# Patient Record
Sex: Male | Born: 1935 | Race: Black or African American | Hispanic: No | Marital: Married | Smoking: Never smoker
Health system: Southern US, Community
[De-identification: ages and names within clinical notes are randomized; demographics above are authoritative.]

## PROBLEM LIST (undated history)

## (undated) ENCOUNTER — Ambulatory Visit (HOSPITAL_COMMUNITY): Admission: EM | Payer: Medicare Other

## (undated) DIAGNOSIS — E039 Hypothyroidism, unspecified: Secondary | ICD-10-CM

## (undated) DIAGNOSIS — I4892 Unspecified atrial flutter: Secondary | ICD-10-CM

## (undated) DIAGNOSIS — K5790 Diverticulosis of intestine, part unspecified, without perforation or abscess without bleeding: Secondary | ICD-10-CM

## (undated) DIAGNOSIS — N185 Chronic kidney disease, stage 5: Secondary | ICD-10-CM

## (undated) DIAGNOSIS — I10 Essential (primary) hypertension: Secondary | ICD-10-CM

## (undated) DIAGNOSIS — E785 Hyperlipidemia, unspecified: Secondary | ICD-10-CM

## (undated) DIAGNOSIS — D649 Anemia, unspecified: Secondary | ICD-10-CM

## (undated) DIAGNOSIS — N400 Enlarged prostate without lower urinary tract symptoms: Secondary | ICD-10-CM

## (undated) DIAGNOSIS — I4891 Unspecified atrial fibrillation: Secondary | ICD-10-CM

## (undated) DIAGNOSIS — N189 Chronic kidney disease, unspecified: Secondary | ICD-10-CM

## (undated) DIAGNOSIS — I5022 Chronic systolic (congestive) heart failure: Secondary | ICD-10-CM

## (undated) DIAGNOSIS — I447 Left bundle-branch block, unspecified: Secondary | ICD-10-CM

## (undated) HISTORY — PX: HERNIA REPAIR: SHX51

## (undated) HISTORY — DX: Chronic kidney disease, stage 5: N18.5

## (undated) HISTORY — DX: Benign prostatic hyperplasia without lower urinary tract symptoms: N40.0

## (undated) HISTORY — DX: Anemia, unspecified: D64.9

## (undated) HISTORY — DX: Hyperlipidemia, unspecified: E78.5

## (undated) HISTORY — DX: Hypothyroidism, unspecified: E03.9

## (undated) HISTORY — DX: Left bundle-branch block, unspecified: I44.7

## (undated) HISTORY — PX: APPENDECTOMY: SHX54

## (undated) HISTORY — DX: Diverticulosis of intestine, part unspecified, without perforation or abscess without bleeding: K57.90

## (undated) HISTORY — DX: Essential (primary) hypertension: I10

## (undated) HISTORY — DX: Unspecified atrial flutter: I48.92

## (undated) HISTORY — DX: Chronic systolic (congestive) heart failure: I50.22

---

## 2005-01-03 ENCOUNTER — Ambulatory Visit: Payer: Self-pay | Admitting: Family Medicine

## 2007-07-20 DIAGNOSIS — N401 Enlarged prostate with lower urinary tract symptoms: Secondary | ICD-10-CM

## 2007-07-20 DIAGNOSIS — D649 Anemia, unspecified: Secondary | ICD-10-CM | POA: Insufficient documentation

## 2007-07-20 DIAGNOSIS — K5730 Diverticulosis of large intestine without perforation or abscess without bleeding: Secondary | ICD-10-CM | POA: Insufficient documentation

## 2007-07-20 DIAGNOSIS — I10 Essential (primary) hypertension: Secondary | ICD-10-CM | POA: Insufficient documentation

## 2007-07-20 DIAGNOSIS — R351 Nocturia: Secondary | ICD-10-CM

## 2008-06-13 ENCOUNTER — Ambulatory Visit: Payer: Self-pay | Admitting: Family Medicine

## 2008-06-13 LAB — CONVERTED CEMR LAB
ALT: 21 units/L (ref 0–53)
Alkaline Phosphatase: 113 units/L (ref 39–117)
BUN: 26 mg/dL — ABNORMAL HIGH (ref 6–23)
Basophils Absolute: 0.1 10*3/uL (ref 0.0–0.1)
Basophils Relative: 1.2 % — ABNORMAL HIGH (ref 0.0–1.0)
Bilirubin, Direct: 0.1 mg/dL (ref 0.0–0.3)
Cholesterol: 293 mg/dL (ref 0–200)
Creatinine, Ser: 1.7 mg/dL — ABNORMAL HIGH (ref 0.4–1.5)
Eosinophils Absolute: 0.1 10*3/uL (ref 0.0–0.7)
GFR calc Af Amer: 51 mL/min
GFR calc non Af Amer: 43 mL/min
Glucose, Bld: 87 mg/dL (ref 70–99)
HCT: 35.4 % — ABNORMAL LOW (ref 39.0–52.0)
Hemoglobin: 12 g/dL — ABNORMAL LOW (ref 13.0–17.0)
MCHC: 34 g/dL (ref 30.0–36.0)
MCV: 90.2 fL (ref 78.0–100.0)
Monocytes Absolute: 0.6 10*3/uL (ref 0.1–1.0)
Neutro Abs: 2.3 10*3/uL (ref 1.4–7.7)
PSA: 1.22 ng/mL (ref 0.10–4.00)
RDW: 13.7 % (ref 11.5–14.6)
TSH: 1.64 microintl units/mL (ref 0.35–5.50)
Transferrin: 263.8 mg/dL (ref >=212.0)
VLDL: 28 mg/dL (ref 0–40)
Vitamin B-12: 626 pg/mL (ref 211–911)

## 2008-09-08 ENCOUNTER — Ambulatory Visit: Payer: Self-pay | Admitting: Gastroenterology

## 2008-09-22 ENCOUNTER — Ambulatory Visit: Payer: Self-pay | Admitting: Gastroenterology

## 2010-09-19 ENCOUNTER — Ambulatory Visit: Payer: Self-pay | Admitting: Family Medicine

## 2010-09-19 ENCOUNTER — Encounter: Payer: Self-pay | Admitting: Family Medicine

## 2010-09-19 LAB — CONVERTED CEMR LAB
Albumin: 4.2 g/dL (ref 3.5–5.2)
Basophils Absolute: 0 10*3/uL (ref 0.0–0.1)
CO2: 27 meq/L (ref 19–32)
Direct LDL: 222.5 mg/dL
Eosinophils Absolute: 0.1 10*3/uL (ref 0.0–0.7)
Glucose, Bld: 87 mg/dL (ref 70–99)
HCT: 34.6 % — ABNORMAL LOW (ref 39.0–52.0)
Hemoglobin: 11.7 g/dL — ABNORMAL LOW (ref 13.0–17.0)
Ketones, urine, test strip: NEGATIVE
Lymphs Abs: 1.1 10*3/uL (ref 0.7–4.0)
MCHC: 33.9 g/dL (ref 30.0–36.0)
MCV: 90 fL (ref 78.0–100.0)
Neutro Abs: 2.2 10*3/uL (ref 1.4–7.7)
Nitrite: NEGATIVE
Potassium: 4.7 meq/L (ref 3.5–5.1)
RDW: 14.5 % (ref 11.5–14.6)
Sodium: 141 meq/L (ref 135–145)
Specific Gravity, Urine: 1.015
TSH: 2.89 microintl units/mL (ref 0.35–5.50)
Triglycerides: 149 mg/dL (ref 0.0–149.0)
WBC Urine, dipstick: NEGATIVE

## 2010-12-31 NOTE — Assessment & Plan Note (Signed)
Summary: emp/pt fasting/cjr   Vital Signs:  Patient profile:   76 year old male Height:      67.25 inches Weight:      166 pounds BMI:     25.90 Temp:     97.7 degrees F oral BP sitting:   210 / 108  (left arm) Cuff size:   regular  Vitals Entered By: Kern Reap CMA Duncan Dull) (September 19, 2010 10:22 AM) CC: annual wellness exam Is Patient Diabetic? No Pain Assessment Patient in pain? no        CC:  annual wellness exam.  History of Present Illness: Douglas Matthews is a 76 year old male, nonsmoker, who comes in after a two-year absence for evaluation of hypertension.  He was last seen here in July of 2009.  He ran out of his blood pressure in July 2010.  His BP today 210/108, asymptomatic.  He also takes a baby aspirin daily.  He gets routine eye care, no dental care.  He has upper and lower dentures, colonoscopy, 2009, normal, tetanus, 2009, Pneumovax 2000, seasonal flu shot today, information given on shingles.  He was given a beta-blocker, 5 mg and point to a clonidine at 10:30 a.m. and we will monitor his blood pressure.[place] previous blood pressure every 15 minutes, and after an hour.  BP dropped to 170 over hundred still asymptomatic   Here for Medicare AWV:  1.   Risk factors based on Past M, S, F history:...reviewed no changes except for above 2.   Physical Activities: walks daily 3.   Depression/mood: good mood.  No depression 4.   Hearing: normal 5.   ADL's: functions normally independently 6.   Fall Risk: reviewed not identified 7.   Home Safety: reviewed.  No guns in the house 8.   Height, weight, &visual acuity:height weight, normal.  Vision normal 9.   Counseling: take his blood pressure medication daily 10.   Labs ordered based on risk factors: done today 11.           Referral Coordination.........none indicated 12.           Care Plan..reviewed the importance of taking his medication monitoring.  His blood pressure and regular.  Follow-up 13.            Cognitive  Assessment oriented x 3 able to do calculations independently  Allergies: No Known Drug Allergies  Past History:  Past medical, surgical, family and social histories (including risk factors) reviewed, and no changes noted (except as noted below).  Past Medical History: Reviewed history from 06/13/2008 and no changes required. High Cholesterol Anemia-NOS Diverticulosis, colon Hypertension Benign prostatic hypertrophy appendectomy hernia undescended left testicle urologic evaluation.  No surgical intervention  Past Surgical History: Reviewed history from 07/20/2007 and no changes required. Appendectomy Inguinal herniorrhaphy  Family History: Reviewed history from 07/20/2007 and no changes required. Family History of Cardiovascular disorder  Social History: Reviewed history from 06/13/2008 and no changes required. Occupation: Married Never Smoked Alcohol use-no Retired  Review of Systems      See HPI       Flu Vaccine Consent Questions     Do you have a history of severe allergic reactions to this vaccine? no    Any prior history of allergic reactions to egg and/or gelatin? no    Do you have a sensitivity to the preservative Thimersol? no    Do you have a past history of Guillan-Barre Syndrome? no    Do you currently have an acute febrile illness?  no    Have you ever had a severe reaction to latex? no    Vaccine information given and explained to patient? yes    Are you currently pregnant? no    Lot Number:[id number]   Exp Date:05/31/2011   Site Given  Right  Deltoid IM   Physical Exam  General:  Well-developed,well-nourished,in no acute distress; alert,appropriate and cooperative throughout examination Head:  Normocephalic and atraumatic without obvious abnormalities. No apparent alopecia or balding. Eyes:  No corneal or conjunctival inflammation noted. EOMI. Perrla. Funduscopic exam benign, without hemorrhages, exudates or papilledema. Vision grossly  normal. Ears:  External ear exam shows no significant lesions or deformities.  Otoscopic examination reveals clear canals, tympanic membranes are intact bilaterally without bulging, retraction, inflammation or discharge. Hearing is grossly normal bilaterally. Nose:  External nasal examination shows no deformity or inflammation. Nasal mucosa are pink and moist without lesions or exudates. Mouth:  Oral mucosa and oropharynx without lesions or exudates. dentures Neck:  No deformities, masses, or tenderness noted. Chest Wall:  No deformities, masses, tenderness or gynecomastia noted. Breasts:  No masses or gynecomastia noted Lungs:  Normal respiratory effort, chest expands symmetrically. Lungs are clear to auscultation, no crackles or wheezes. Heart:  Normal rate and regular rhythm. S1 and S2 normal without gallop, murmur, click, rub or other extra sounds. Abdomen:  Bowel sounds positive,abdomen soft and non-tender without masses, organomegaly or hernias noted.........Marland Kitchensmall ventral hernia Rectal:  No external abnormalities noted. Normal sphincter tone. No rectal masses or tenderness. Genitalia:  Testes bilaterally descended without nodularity, tenderness or masses. No scrotal masses or lesions. No penis lesions or urethral discharge. Prostate:  no nodules, no asymmetry, and 1+ enlarged.   Msk:  No deformity or scoliosis noted of thoracic or lumbar spine.   Pulses:  R and L carotid,radial,femoral,dorsalis pedis and posterior tibial pulses are full and equal bilaterally Extremities:  No clubbing, cyanosis, edema, or deformity noted with normal full range of motion of all joints.   Neurologic:  No cranial nerve deficits noted. Station and gait are normal. Plantar reflexes are down-going bilaterally. DTRs are symmetrical throughout. Sensory, motor and coordinative functions appear intact. Skin:  Intact without suspicious lesions or rashes Cervical Nodes:  No lymphadenopathy noted Axillary Nodes:  No  palpable lymphadenopathy Inguinal Nodes:  No significant adenopathy Psych:  Cognition and judgment appear intact. Alert and cooperative with normal attention span and concentration. No apparent delusions, illusions, hallucinations   Impression & Recommendations:  Problem # 1:  BENIGN PROSTATIC HYPERTROPHY (ICD-600.00) Assessment Unchanged  Orders: Venipuncture (16109) TLB-Lipid Panel (80061-LIPID) TLB-BMP (Basic Metabolic Panel-BMET) (80048-METABOL) TLB-CBC Platelet - w/Differential (85025-CBCD) TLB-Hepatic/Liver Function Pnl (80076-HEPATIC) TLB-TSH (Thyroid Stimulating Hormone) (84443-TSH) TLB-PSA (Prostate Specific Antigen) (84153-PSA) Prescription Created Electronically [phone]) Medicare -1st Annual Wellness Visit [phone]) Urinalysis-dipstick only (Medicare patient) ([id number])  Problem # 2:  HYPERTENSION (ICD-401.9) Assessment: Deteriorated  His updated medication list for this problem includes:    Tenoretic 50 50-25 Mg Tabs (Atenolol-chlorthalidone) .Marland Kitchen... 1/2 qam  Orders: Venipuncture (56213) TLB-Lipid Panel (80061-LIPID) TLB-BMP (Basic Metabolic Panel-BMET) (80048-METABOL) TLB-CBC Platelet - w/Differential (85025-CBCD) TLB-Hepatic/Liver Function Pnl (80076-HEPATIC) TLB-TSH (Thyroid Stimulating Hormone) (84443-TSH) TLB-PSA (Prostate Specific Antigen) (84153-PSA) Prescription Created Electronically [phone]) Medicare -1st Annual Wellness Visit [phone]) Urinalysis-dipstick only (Medicare patient) (29528UX) EKG w/ Interpretation (93000)  Problem # 3:  Preventive Health Care (ICD-V70.0) Assessment: Unchanged  Complete Medication List: 1)  Adult Aspirin Low Strength 81 Mg Tbdp (Aspirin) 2)  Tenoretic 50 50-25 Mg Tabs (Atenolol-chlorthalidone) .... 1/2 qam  Other Orders: Flu Vaccine 69yrs +  MEDICARE PATIENTS [phone]) Administration Flu vaccine - MCR (Z3086)  Patient Instructions: 1)  restart the Tenoretic 50 -- 25 does one half tablet q.a.m.Marland Kitchen  Purchase a new digital blood  pressure cuff and measure your blood pressure daily in the morning.  Return in one week for follow-up with the data in the device 2)  Take an Aspirin every day. 3)  Choose your Health care Power of Attorney and/or prepare a Living Will. Prescriptions: TENORETIC 50 50-25 MG  TABS (ATENOLOL-CHLORTHALIDONE) 1/2 qam  #50 x 3   Entered and Authorized by:   Roderick Pee MD   Signed by:   Roderick Pee MD on 09/19/2010   Method used:   Electronically to        [facility] (retail)       [address]       [place], [place]  [address]       Ph: [phone]       Fax: [phone]   RxID:   [id number]    Orders Added: 1)  Venipuncture [87564] 2)  TLB-Lipid Panel [80061-LIPID] 3)  TLB-BMP (Basic Metabolic Panel-BMET) [80048-METABOL] 4)  TLB-CBC Platelet - w/Differential [85025-CBCD] 5)  TLB-Hepatic/Liver Function Pnl [80076-HEPATIC] 6)  TLB-TSH (Thyroid Stimulating Hormone) [84443-TSH] 7)  TLB-PSA (Prostate Specific Antigen) [33295-JOA] 8)  Prescription Created Electronically [G8553] 9)  Medicare -1st Annual Wellness Visit [G0438] 10)  Urinalysis-dipstick only (Medicare patient) [81003QW] 11)  EKG w/ Interpretation [93000] 12)  Flu Vaccine 79yrs + MEDICARE PATIENTS [Q2039] 13)  Administration Flu vaccine - MCR [G0008]    Laboratory Results   Urine Tests    Routine Urinalysis   Color: yellow Appearance: Clear Glucose: negative   (Normal Range: Negative) Bilirubin: negative   (Normal Range: Negative) Ketone: negative   (Normal Range: Negative) Spec. Gravity: 1.015   (Normal Range: 1.003-1.035) Blood: negative   (Normal Range: Negative) pH: 5.5   (Normal Range: 5.0-8.0) Protein: negative   (Normal Range: Negative) Urobilinogen: 0.2   (Normal Range: 0-1) Nitrite: negative   (Normal Range: Negative) Leukocyte Esterace: negative   (Normal Range: Negative)    Comments: Rita Ohara  September 19, 2010 11:37 AM

## 2011-12-15 ENCOUNTER — Ambulatory Visit (INDEPENDENT_AMBULATORY_CARE_PROVIDER_SITE_OTHER): Payer: Medicare Other | Admitting: Family Medicine

## 2011-12-15 ENCOUNTER — Encounter: Payer: Self-pay | Admitting: Family Medicine

## 2011-12-15 DIAGNOSIS — N400 Enlarged prostate without lower urinary tract symptoms: Secondary | ICD-10-CM

## 2011-12-15 DIAGNOSIS — Z0000 Encounter for general adult medical examination without abnormal findings: Secondary | ICD-10-CM

## 2011-12-15 DIAGNOSIS — Z23 Encounter for immunization: Secondary | ICD-10-CM

## 2011-12-15 DIAGNOSIS — I10 Essential (primary) hypertension: Secondary | ICD-10-CM

## 2011-12-15 DIAGNOSIS — D649 Anemia, unspecified: Secondary | ICD-10-CM

## 2011-12-15 LAB — CBC WITH DIFFERENTIAL/PLATELET
Basophils Absolute: 0 10*3/uL (ref 0.0–0.1)
Eosinophils Absolute: 0.1 10*3/uL (ref 0.0–0.7)
Lymphocytes Relative: 27.9 % (ref 12.0–46.0)
MCHC: 33.9 g/dL (ref 30.0–36.0)
Neutrophils Relative %: 54.4 % (ref 43.0–77.0)
Platelets: 314 10*3/uL (ref 150.0–400.0)
RBC: 3.9 Mil/uL — ABNORMAL LOW (ref 4.22–5.81)
RDW: 14.3 % (ref 11.5–14.6)

## 2011-12-15 LAB — LIPID PANEL
Cholesterol: 300 mg/dL — ABNORMAL HIGH (ref 0–200)
Total CHOL/HDL Ratio: 6

## 2011-12-15 LAB — POCT URINALYSIS DIPSTICK
Glucose, UA: NEGATIVE
Nitrite, UA: NEGATIVE
Protein, UA: NEGATIVE
Urobilinogen, UA: 0.2

## 2011-12-15 LAB — BASIC METABOLIC PANEL
Calcium: 9.1 mg/dL (ref 8.4–10.5)
Creatinine, Ser: 3.2 mg/dL — ABNORMAL HIGH (ref 0.4–1.5)
GFR: 24.8 mL/min — ABNORMAL LOW (ref >60.00)
Glucose, Bld: 108 mg/dL — ABNORMAL HIGH (ref 70–99)
Sodium: 139 mEq/L (ref 135–145)

## 2011-12-15 LAB — HEPATIC FUNCTION PANEL
AST: 19 U/L (ref 0–37)
Albumin: 4.2 g/dL (ref 3.5–5.2)
Alkaline Phosphatase: 147 U/L — ABNORMAL HIGH (ref 39–117)
Bilirubin, Direct: 0.1 mg/dL (ref 0.0–0.3)
Total Protein: 7.8 g/dL (ref 6.0–8.3)

## 2011-12-15 LAB — TSH: TSH: 1.57 u[IU]/mL (ref 0.35–5.50)

## 2011-12-15 MED ORDER — ATENOLOL 25 MG PO TABS
25.0000 mg | ORAL_TABLET | Freq: Every day | ORAL | Status: DC
Start: 2011-12-15 — End: 2012-12-09

## 2011-12-15 NOTE — Patient Instructions (Signed)
Take the Tenormin, one tablet daily.  Purchase a upper arm. digital blood pressure cuff and measure your blood pressure daily in the morning.  Return in 4 weeks with the data and the device to follow-up on your blood pressure to be sure it is normal

## 2011-12-15 NOTE — Progress Notes (Signed)
  Subjective:    Patient ID: Douglas Matthews, male    DOB: [birth year], 77 y.o.   MRN: [id number]  HPI  Ekam  is a 77 year old, married man nonsmoker Who comes in today for a Medicare wellness examination because of a history of hypertension, BPH, without a blood obstruction, anemia, and history of diverticulosis.  He has not been taking his beta blocker for his hypertension.  BP today 140/100 pulse 80 and regular.  He gets routine eye care, hearing normal, upper and lower dentures, colonoscopy, and GI, tetanus, 2009, Pneumovax, x 2, seasonal flu shot 2012.  Information given on shingles.  Cognitive function normal.  He walks on a regular basis.  His weight is stable at hundred and 66 pounds two he manages his own financial affairs, and no safety reviewed.  No issues identified, no guns in the house, he does not have a healthcare power of attorney, nor a living will.  Advised to get those this year.  Review of Systems  Constitutional: Negative.   HENT: Negative.   Eyes: Negative.   Respiratory: Negative.   Cardiovascular: Negative.   Gastrointestinal: Negative.   Genitourinary: Negative.   Musculoskeletal: Negative.   Skin: Negative.   Neurological: Negative.   Hematological: Negative.   Psychiatric/Behavioral: Negative.        Objective:   Physical Exam  Constitutional: He is oriented to person, place, and time. He appears well-developed and well-nourished.  HENT:  Head: Normocephalic and atraumatic.  Right Ear: External ear normal.  Left Ear: External ear normal.  Nose: Nose normal.  Mouth/Throat: Oropharynx is clear and moist.  Eyes: Conjunctivae and EOM are normal. Pupils are equal, round, and reactive to light.  Neck: Normal range of motion. Neck supple. No JVD present. No tracheal deviation present. No thyromegaly present.  Cardiovascular: Normal rate, regular rhythm, normal heart sounds and intact distal pulses.  Exam reveals no gallop and no friction rub.   No murmur  heard. Pulmonary/Chest: Effort normal and breath sounds normal. No stridor. No respiratory distress. He has no wheezes. He has no rales. He exhibits no tenderness.  Abdominal: Soft. Bowel sounds are normal. He exhibits no distension and no mass. There is no tenderness. There is no rebound and no guarding.  Genitourinary: Rectum normal and penis normal. Guaiac negative stool. No penile tenderness.       One to 2+ symmetrical.  BPH, non-nodular  Musculoskeletal: Normal range of motion. He exhibits no edema and no tenderness.  Lymphadenopathy:    He has no cervical adenopathy.  Neurological: He is alert and oriented to person, place, and time. He has normal reflexes. No cranial nerve deficit. He exhibits normal muscle tone.  Skin: Skin is warm and dry. No rash noted. No erythema. No pallor.  Psychiatric: He has a normal mood and affect. His behavior is normal. Judgment and thought content normal.          Assessment & Plan:  Healthy male.  Hypertension.  We start medication, and BP checked daily.  Follow-up in 4 weeks.  BPH with outlet obstruction.  Check labs

## 2011-12-23 ENCOUNTER — Ambulatory Visit (INDEPENDENT_AMBULATORY_CARE_PROVIDER_SITE_OTHER): Payer: Medicare Other | Admitting: Family Medicine

## 2011-12-23 ENCOUNTER — Encounter: Payer: Self-pay | Admitting: Family Medicine

## 2011-12-23 DIAGNOSIS — N19 Unspecified kidney failure: Secondary | ICD-10-CM | POA: Diagnosis not present

## 2011-12-23 DIAGNOSIS — I10 Essential (primary) hypertension: Secondary | ICD-10-CM

## 2011-12-23 NOTE — Patient Instructions (Signed)
Stay at complete bedrest at home.  Drink lots of water.  Take a 25-mg Tenormin tablet now then 25 mg every morning.  Salt free diet.  Check your blood pressure 3 times daily.  Return on Thursday for follow-up.  We will get you set up for a nephrology consult ASAP

## 2011-12-23 NOTE — Progress Notes (Signed)
  Subjective:    Patient ID: Douglas Matthews, male    DOB: [birth year], 77 y.o.   MRN: [id number]  HPI Douglas Matthews is a 77 year old maleWho comes in today for follow up of hypertension.  He is not taking his medication Tenormin, 50 mg on a daily basis.  He states he can't remember to take it every day.  BP today 198/105.  Right arm sitting position.  Neurologically asymptomatic.  He was given  2  clonidine and 50 mg of Benicar, and BP was checked Q15 minutes.  His blood pressure dropped to 170 over hundred, pulse was 80 and regular.  His lab work week ago shows a creatinine of 3.2, BUN of 49, and a GFR of 24.  We will maintain him at bed rest at home and continue low-dose beta-blocker and get him set up for a nephrology consult ASAP   Review of Systems General and cardiovascular review of systems otherwise negative    Objective:   Physical Exam  Well-developed well-nourished man in no acute distress.  BP right arms, sitting position initially, 195/105 upon discharge 170 over hundred      Assessment & Plan:  Hypertension and renal failure.  Plan bed rest at home, salt free diet, continue beta-blocker daily.  Follow-up in 48 hours nephrology consult ASAP

## 2011-12-25 ENCOUNTER — Encounter: Payer: Self-pay | Admitting: Family Medicine

## 2011-12-25 ENCOUNTER — Ambulatory Visit (INDEPENDENT_AMBULATORY_CARE_PROVIDER_SITE_OTHER): Payer: Medicare Other | Admitting: Family Medicine

## 2011-12-25 VITALS — BP 140/80 | Temp 97.6°F | Wt 169.0 lb

## 2011-12-25 DIAGNOSIS — K5730 Diverticulosis of large intestine without perforation or abscess without bleeding: Secondary | ICD-10-CM | POA: Diagnosis not present

## 2011-12-25 DIAGNOSIS — N19 Unspecified kidney failure: Secondary | ICD-10-CM | POA: Diagnosis not present

## 2011-12-25 DIAGNOSIS — I10 Essential (primary) hypertension: Secondary | ICD-10-CM | POA: Diagnosis not present

## 2011-12-25 LAB — BASIC METABOLIC PANEL
BUN: 36 mg/dL — ABNORMAL HIGH (ref 6–23)
Calcium: 8.9 mg/dL (ref 8.4–10.5)
GFR: 28.5 mL/min — ABNORMAL LOW (ref >60.00)
Potassium: 6.1 mEq/L (ref 3.5–5.1)
Sodium: 138 mEq/L (ref 135–145)

## 2011-12-25 NOTE — Patient Instructions (Signed)
Resume U. Normal activities.  Salt free diet.  Take your blood pressure daily in the morning.  Blood pressure check at home once daily.  Return in one week for follow-up, sooner if any problems

## 2011-12-25 NOTE — Progress Notes (Signed)
  Subjective:    Patient ID: Douglas Matthews, male    DOB: 1934-12-25, 77 y.o.   MRN: [id number]  HPI Douglas Matthews is a 77 year old male, who comes in today for reevaluation of hypertension.  We saw him earlier in the week with marked elevation of his blood pressure 190 over hundred.  We gave him clonidine and a beta blocker here in the office and blood pressure came down to 170 systolic.  He was placed at bed rest at home.  Complete salt free diet and given Tenormin 25 mg daily.  He comes in today stating he feels much better.  BP 140/80   Review of Systems    General and cardiovascular review of systems otherwise negative Objective:   Physical Exam  Well-developed well-nourished, male in no acute distress.  BP right arm sitting position 140/90, pulse 70 and regular      Assessment & Plan:  Hypertension and continue current therapy,,,,,,,,,,,, allowed to ambulate, salt free diet, well 15 minutes daily, continue beta-blocker 25 mg daily, BP checked daily.  Renal failure.  Plan recheck labs.  Nephrology consult ASAP

## 2011-12-26 ENCOUNTER — Other Ambulatory Visit: Payer: Medicare Other

## 2011-12-29 ENCOUNTER — Other Ambulatory Visit (INDEPENDENT_AMBULATORY_CARE_PROVIDER_SITE_OTHER): Payer: Medicare Other

## 2011-12-29 DIAGNOSIS — I10 Essential (primary) hypertension: Secondary | ICD-10-CM

## 2011-12-29 LAB — BASIC METABOLIC PANEL
CO2: 25 mEq/L (ref 19–32)
Calcium: 8.9 mg/dL (ref 8.4–10.5)
Creatinine, Ser: 2.1 mg/dL — ABNORMAL HIGH (ref 0.4–1.5)
Sodium: 141 mEq/L (ref 135–145)

## 2012-01-02 DIAGNOSIS — I10 Essential (primary) hypertension: Secondary | ICD-10-CM | POA: Diagnosis not present

## 2012-01-02 DIAGNOSIS — D509 Iron deficiency anemia, unspecified: Secondary | ICD-10-CM | POA: Diagnosis not present

## 2012-01-02 DIAGNOSIS — N179 Acute kidney failure, unspecified: Secondary | ICD-10-CM | POA: Diagnosis not present

## 2012-01-12 ENCOUNTER — Ambulatory Visit: Payer: Medicare Other | Admitting: Family Medicine

## 2012-01-29 DIAGNOSIS — N179 Acute kidney failure, unspecified: Secondary | ICD-10-CM | POA: Diagnosis not present

## 2012-01-29 DIAGNOSIS — D509 Iron deficiency anemia, unspecified: Secondary | ICD-10-CM | POA: Diagnosis not present

## 2012-03-22 DIAGNOSIS — D509 Iron deficiency anemia, unspecified: Secondary | ICD-10-CM | POA: Diagnosis not present

## 2012-03-22 DIAGNOSIS — N179 Acute kidney failure, unspecified: Secondary | ICD-10-CM | POA: Diagnosis not present

## 2012-03-22 DIAGNOSIS — I10 Essential (primary) hypertension: Secondary | ICD-10-CM | POA: Diagnosis not present

## 2012-04-01 DIAGNOSIS — N2581 Secondary hyperparathyroidism of renal origin: Secondary | ICD-10-CM | POA: Diagnosis not present

## 2012-04-01 DIAGNOSIS — D631 Anemia in chronic kidney disease: Secondary | ICD-10-CM | POA: Diagnosis not present

## 2012-04-01 DIAGNOSIS — I10 Essential (primary) hypertension: Secondary | ICD-10-CM | POA: Diagnosis not present

## 2012-04-01 DIAGNOSIS — N039 Chronic nephritic syndrome with unspecified morphologic changes: Secondary | ICD-10-CM | POA: Diagnosis not present

## 2012-04-05 ENCOUNTER — Other Ambulatory Visit (HOSPITAL_COMMUNITY): Payer: Self-pay | Admitting: *Deleted

## 2012-04-06 ENCOUNTER — Encounter (HOSPITAL_COMMUNITY): Payer: Medicare Other

## 2012-05-07 ENCOUNTER — Encounter (HOSPITAL_COMMUNITY): Payer: Medicare Other

## 2012-07-20 DIAGNOSIS — N2581 Secondary hyperparathyroidism of renal origin: Secondary | ICD-10-CM | POA: Diagnosis not present

## 2012-07-20 DIAGNOSIS — N039 Chronic nephritic syndrome with unspecified morphologic changes: Secondary | ICD-10-CM | POA: Diagnosis not present

## 2012-07-20 DIAGNOSIS — I10 Essential (primary) hypertension: Secondary | ICD-10-CM | POA: Diagnosis not present

## 2012-07-20 DIAGNOSIS — D509 Iron deficiency anemia, unspecified: Secondary | ICD-10-CM | POA: Diagnosis not present

## 2012-10-19 DIAGNOSIS — N039 Chronic nephritic syndrome with unspecified morphologic changes: Secondary | ICD-10-CM | POA: Diagnosis not present

## 2012-10-19 DIAGNOSIS — I10 Essential (primary) hypertension: Secondary | ICD-10-CM | POA: Diagnosis not present

## 2012-10-19 DIAGNOSIS — D509 Iron deficiency anemia, unspecified: Secondary | ICD-10-CM | POA: Diagnosis not present

## 2012-10-19 DIAGNOSIS — N2581 Secondary hyperparathyroidism of renal origin: Secondary | ICD-10-CM | POA: Diagnosis not present

## 2012-11-04 DIAGNOSIS — D509 Iron deficiency anemia, unspecified: Secondary | ICD-10-CM | POA: Diagnosis not present

## 2012-11-04 DIAGNOSIS — I10 Essential (primary) hypertension: Secondary | ICD-10-CM | POA: Diagnosis not present

## 2012-11-04 DIAGNOSIS — N2581 Secondary hyperparathyroidism of renal origin: Secondary | ICD-10-CM | POA: Diagnosis not present

## 2012-11-04 DIAGNOSIS — Z23 Encounter for immunization: Secondary | ICD-10-CM | POA: Diagnosis not present

## 2012-11-18 DIAGNOSIS — I120 Hypertensive chronic kidney disease with stage 5 chronic kidney disease or end stage renal disease: Secondary | ICD-10-CM | POA: Diagnosis not present

## 2012-12-09 ENCOUNTER — Emergency Department (HOSPITAL_COMMUNITY)
Admission: EM | Admit: 2012-12-09 | Discharge: 2012-12-09 | Disposition: A | Discharge status: Home / Self Care | Payer: Medicare Other | Attending: Emergency Medicine | Admitting: Emergency Medicine

## 2012-12-09 ENCOUNTER — Encounter (HOSPITAL_COMMUNITY): Payer: Self-pay | Admitting: Cardiology

## 2012-12-09 DIAGNOSIS — Z9889 Other specified postprocedural states: Secondary | ICD-10-CM | POA: Insufficient documentation

## 2012-12-09 DIAGNOSIS — Z7982 Long term (current) use of aspirin: Secondary | ICD-10-CM | POA: Insufficient documentation

## 2012-12-09 DIAGNOSIS — Z862 Personal history of diseases of the blood and blood-forming organs and certain disorders involving the immune mechanism: Secondary | ICD-10-CM | POA: Diagnosis not present

## 2012-12-09 DIAGNOSIS — N2581 Secondary hyperparathyroidism of renal origin: Secondary | ICD-10-CM | POA: Diagnosis not present

## 2012-12-09 DIAGNOSIS — E875 Hyperkalemia: Secondary | ICD-10-CM | POA: Insufficient documentation

## 2012-12-09 DIAGNOSIS — D649 Anemia, unspecified: Secondary | ICD-10-CM | POA: Diagnosis not present

## 2012-12-09 DIAGNOSIS — Z9089 Acquired absence of other organs: Secondary | ICD-10-CM | POA: Diagnosis not present

## 2012-12-09 DIAGNOSIS — Z87448 Personal history of other diseases of urinary system: Secondary | ICD-10-CM | POA: Diagnosis not present

## 2012-12-09 DIAGNOSIS — N289 Disorder of kidney and ureter, unspecified: Secondary | ICD-10-CM | POA: Insufficient documentation

## 2012-12-09 DIAGNOSIS — E785 Hyperlipidemia, unspecified: Secondary | ICD-10-CM | POA: Insufficient documentation

## 2012-12-09 DIAGNOSIS — I10 Essential (primary) hypertension: Secondary | ICD-10-CM | POA: Diagnosis not present

## 2012-12-09 DIAGNOSIS — I120 Hypertensive chronic kidney disease with stage 5 chronic kidney disease or end stage renal disease: Secondary | ICD-10-CM | POA: Diagnosis not present

## 2012-12-09 LAB — CBC
HCT: 33.1 % — ABNORMAL LOW (ref 39.0–52.0)
Hemoglobin: 10.7 g/dL — ABNORMAL LOW (ref 13.0–17.0)
MCH: 28.3 pg (ref 26.0–34.0)
MCHC: 32.3 g/dL (ref 30.0–36.0)

## 2012-12-09 LAB — BASIC METABOLIC PANEL
BUN: 39 mg/dL — ABNORMAL HIGH (ref 6–23)
Calcium: 9.7 mg/dL (ref 8.4–10.5)
GFR calc non Af Amer: 19 mL/min — ABNORMAL LOW (ref >90)
Glucose, Bld: 111 mg/dL — ABNORMAL HIGH (ref 70–99)

## 2012-12-09 MED ORDER — SODIUM POLYSTYRENE SULFONATE 15 GM/60ML PO SUSP
50.0000 g | Freq: Once | ORAL | Status: AC
  Administered 2012-12-09: 50 g via ORAL
  Filled 2012-12-09: qty 60
  Filled 2012-12-09: qty 120
  Filled 2012-12-09: qty 60

## 2012-12-09 NOTE — ED Notes (Signed)
Dr Ignacia Palma notified of potassium of 6.4.

## 2012-12-09 NOTE — ED Notes (Signed)
Pt reports he was having routine lab work done today and received a call about elevated potassium levels. States he is seen by [facility], denies any chest pain, SOB or N/v. Pt states he "feels fine". No distress noted at triage.

## 2012-12-09 NOTE — ED Provider Notes (Signed)
History     CSN: [id number]  Arrival date & time 12/09/12  1658   None     Chief Complaint  Patient presents with  . Abnormal Lab    (Consider location/radiation/quality/duration/timing/severity/associated sxs/prior treatment) Patient is a 78 y.o. male presenting with general illness. The history is provided by the patient.  Illness  Episode onset: The patient had been seen by his nephrologist, and had blood work. His potassium was found to be high. Therefore he was sent to the ED for further evaluation. Episode frequency: He has never had high potassium before. He is entirely asymptomatic. The problem has been unchanged. The problem is moderate. Nothing relieves the symptoms. Nothing aggravates the symptoms. Pertinent negatives include no fever. He has been eating and drinking normally. Urine output has been normal. There were no sick contacts. Recently, medical care has been given by a specialist and at another facility.    Past Medical History  Diagnosis Date  . Hyperlipidemia   . Anemia   . Diverticulosis   . Hypertension   . BPH (benign prostatic hypertrophy)     Past Surgical History  Procedure Date  . Appendectomy   . Hernia repair     Family History  Problem Relation Age of Onset  . Heart disease Other     History  Substance Use Topics  . Smoking status: Never Smoker   . Smokeless tobacco: Not on file  . Alcohol Use: No      Review of Systems  Constitutional: Negative for fever.  All other systems reviewed and are negative.    Allergies  Review of patient's allergies indicates no known allergies.  Home Medications   Current Outpatient Rx  Name  Route  Sig  Dispense  Refill  . AMLODIPINE BESYLATE 5 MG PO TABS   Oral   Take 5 mg by mouth daily.         . ASPIRIN 81 MG PO TABS   Oral   Take 81 mg by mouth daily.          . ATENOLOL 50 MG PO TABS   Oral   Take 50 mg by mouth daily.           BP 124/75  Pulse 69  Temp 98 F (36.7  C) (Oral)  Resp 13  SpO2 100%  Physical Exam  Nursing note and vitals reviewed. Constitutional: He is oriented to person, place, and time. He appears well-developed and well-nourished. No distress.  HENT:  Head: Normocephalic and atraumatic.  Right Ear: External ear normal.  Left Ear: External ear normal.  Mouth/Throat: Oropharynx is clear and moist.  Eyes: EOM are normal. Pupils are equal, round, and reactive to light.  Neck: Normal range of motion. Neck supple.  Cardiovascular: Normal rate, regular rhythm and normal heart sounds.   Pulmonary/Chest: Effort normal and breath sounds normal.  Abdominal: Soft. Bowel sounds are normal.  Musculoskeletal: Normal range of motion.  Neurological: He is alert and oriented to person, place, and time.       No Sensory or motor deficit.  Skin: Skin is warm and dry.  Psychiatric: He has a normal mood and affect. His behavior is normal.    ED Course  Procedures (including critical care time)  7:39 PM Results for orders placed during the hospital encounter of 12/09/12  CBC      Component Value Range   WBC 4.7  4.0 - 10.5 K/uL   RBC 3.78 (*) 4.22 - 5.81 MIL/uL  Hemoglobin 10.7 (*) 13.0 - 17.0 g/dL   HCT 40.9 (*) 81.1 - 91.4 %   MCV 87.6  78.0 - 100.0 fL   MCH 28.3  26.0 - 34.0 pg   MCHC 32.3  30.0 - 36.0 g/dL   RDW 78.2  95.6 - 21.3 %   Platelets 223  150 - 400 K/uL  BASIC METABOLIC PANEL      Component Value Range   Sodium 136  135 - 145 mEq/L   Potassium 6.4 (*) 3.5 - 5.1 mEq/L   Chloride 102  96 - 112 mEq/L   CO2 21  19 - 32 mEq/L   Glucose, Bld 111 (*) 70 - 99 mg/dL   BUN 39 (*) 6 - 23 mg/dL   Creatinine, Ser 0.86 (*) 0.50 - 1.35 mg/dL   Calcium 9.7  8.4 - 57.8 mg/dL   GFR calc non Af Amer 19 (*) >90 mL/min   GFR calc Af Amer 22 (*) >90 mL/min  POCT I-STAT TROPONIN I      Component Value Range   Troponin i, poc 0.01  0.00 - 0.08 ng/mL   Comment 3             Potassium was high at 6.4. Patient's condition was discussed  with Beryle Lathe, M.D., his nephrologist. He recommended Kayexalate 50 g orally. Patient should followup in the office tomorrow for repeat potassium check.     1. Hyperkalemia   2. Renal insufficiency          Carleene Cooper III, MD 12/10/12 1256

## 2012-12-09 NOTE — ED Notes (Signed)
Pt sent here by [facility] d/t elevated potassium level, pt states, "My doctor found protein in my kidneys & I have been going to the kidney doctor since."

## 2012-12-10 DIAGNOSIS — E875 Hyperkalemia: Secondary | ICD-10-CM | POA: Diagnosis not present

## 2013-02-17 DIAGNOSIS — I10 Essential (primary) hypertension: Secondary | ICD-10-CM | POA: Diagnosis not present

## 2013-02-17 DIAGNOSIS — E876 Hypokalemia: Secondary | ICD-10-CM | POA: Diagnosis not present

## 2013-02-17 DIAGNOSIS — N2581 Secondary hyperparathyroidism of renal origin: Secondary | ICD-10-CM | POA: Diagnosis not present

## 2013-02-17 DIAGNOSIS — D649 Anemia, unspecified: Secondary | ICD-10-CM | POA: Diagnosis not present

## 2013-03-02 ENCOUNTER — Other Ambulatory Visit (HOSPITAL_COMMUNITY): Payer: Self-pay | Admitting: *Deleted

## 2013-03-03 ENCOUNTER — Encounter (HOSPITAL_COMMUNITY): Payer: Medicare Other

## 2013-03-21 DIAGNOSIS — N2581 Secondary hyperparathyroidism of renal origin: Secondary | ICD-10-CM | POA: Diagnosis not present

## 2013-03-21 DIAGNOSIS — D649 Anemia, unspecified: Secondary | ICD-10-CM | POA: Diagnosis not present

## 2013-03-21 DIAGNOSIS — I10 Essential (primary) hypertension: Secondary | ICD-10-CM | POA: Diagnosis not present

## 2013-05-16 DIAGNOSIS — E875 Hyperkalemia: Secondary | ICD-10-CM | POA: Diagnosis not present

## 2013-05-16 DIAGNOSIS — N2581 Secondary hyperparathyroidism of renal origin: Secondary | ICD-10-CM | POA: Diagnosis not present

## 2013-05-16 DIAGNOSIS — I10 Essential (primary) hypertension: Secondary | ICD-10-CM | POA: Diagnosis not present

## 2013-05-16 DIAGNOSIS — D509 Iron deficiency anemia, unspecified: Secondary | ICD-10-CM | POA: Diagnosis not present

## 2013-07-12 DIAGNOSIS — D509 Iron deficiency anemia, unspecified: Secondary | ICD-10-CM | POA: Diagnosis not present

## 2013-07-12 DIAGNOSIS — I10 Essential (primary) hypertension: Secondary | ICD-10-CM | POA: Diagnosis not present

## 2013-07-12 DIAGNOSIS — N2581 Secondary hyperparathyroidism of renal origin: Secondary | ICD-10-CM | POA: Diagnosis not present

## 2014-01-09 ENCOUNTER — Ambulatory Visit (INDEPENDENT_AMBULATORY_CARE_PROVIDER_SITE_OTHER): Payer: Medicare Other | Admitting: Family Medicine

## 2014-01-09 ENCOUNTER — Encounter: Payer: Self-pay | Admitting: Family Medicine

## 2014-01-09 VITALS — BP 190/100 | Temp 98.1°F | Ht 68.0 in | Wt 168.0 lb

## 2014-01-09 DIAGNOSIS — N19 Unspecified kidney failure: Secondary | ICD-10-CM | POA: Diagnosis not present

## 2014-01-09 DIAGNOSIS — D649 Anemia, unspecified: Secondary | ICD-10-CM | POA: Diagnosis not present

## 2014-01-09 DIAGNOSIS — Z0000 Encounter for general adult medical examination without abnormal findings: Secondary | ICD-10-CM

## 2014-01-09 DIAGNOSIS — N400 Enlarged prostate without lower urinary tract symptoms: Secondary | ICD-10-CM

## 2014-01-09 DIAGNOSIS — Z23 Encounter for immunization: Secondary | ICD-10-CM

## 2014-01-09 DIAGNOSIS — I10 Essential (primary) hypertension: Secondary | ICD-10-CM | POA: Diagnosis not present

## 2014-01-09 LAB — CBC WITH DIFFERENTIAL/PLATELET
BASOS PCT: 0.4 % (ref 0.0–3.0)
Basophils Absolute: 0 10*3/uL (ref 0.0–0.1)
EOS PCT: 2.6 % (ref 0.0–5.0)
Eosinophils Absolute: 0.1 10*3/uL (ref 0.0–0.7)
HCT: 34.9 % — ABNORMAL LOW (ref 39.0–52.0)
HEMOGLOBIN: 11.5 g/dL — AB (ref 13.0–17.0)
Lymphocytes Relative: 25.1 % (ref 12.0–46.0)
Lymphs Abs: 1.2 10*3/uL (ref 0.7–4.0)
MCHC: 32.9 g/dL (ref 30.0–36.0)
MCV: 89.5 fl (ref 78.0–100.0)
MONO ABS: 0.7 10*3/uL (ref 0.1–1.0)
Monocytes Relative: 14.6 % — ABNORMAL HIGH (ref 3.0–12.0)
NEUTROS ABS: 2.7 10*3/uL (ref 1.4–7.7)
NEUTROS PCT: 57.3 % (ref 43.0–77.0)
Platelets: 227 10*3/uL (ref 150.0–400.0)
RBC: 3.89 Mil/uL — AB (ref 4.22–5.81)
RDW: 15.5 % — ABNORMAL HIGH (ref 11.5–14.6)
WBC: 4.7 10*3/uL (ref 4.5–10.5)

## 2014-01-09 LAB — BASIC METABOLIC PANEL
BUN: 39 mg/dL — ABNORMAL HIGH (ref 6–23)
CHLORIDE: 107 meq/L (ref 96–112)
CO2: 24 meq/L (ref 19–32)
Calcium: 9.2 mg/dL (ref 8.4–10.5)
Creatinine, Ser: 2.2 mg/dL — ABNORMAL HIGH (ref 0.4–1.5)
GFR: 37.59 mL/min — ABNORMAL LOW (ref >60.00)
GLUCOSE: 86 mg/dL (ref 70–99)
Potassium: 5.2 mEq/L — ABNORMAL HIGH (ref 3.5–5.1)
SODIUM: 140 meq/L (ref 135–145)

## 2014-01-09 LAB — POCT URINALYSIS DIPSTICK
Bilirubin, UA: NEGATIVE
GLUCOSE UA: NEGATIVE
Leukocytes, UA: NEGATIVE
Nitrite, UA: NEGATIVE
RBC UA: NEGATIVE
SPEC GRAV UA: 1.02
Urobilinogen, UA: 0.2
pH, UA: 5.5

## 2014-01-09 LAB — PSA: PSA: 2.59 ng/mL (ref 0.10–4.00)

## 2014-01-09 LAB — HEPATIC FUNCTION PANEL
ALK PHOS: 151 U/L — AB (ref 39–117)
ALT: 15 U/L (ref 0–53)
AST: 20 U/L (ref 0–37)
Albumin: 4.1 g/dL (ref 3.5–5.2)
BILIRUBIN DIRECT: 0.1 mg/dL (ref 0.0–0.3)
BILIRUBIN TOTAL: 0.5 mg/dL (ref 0.3–1.2)
TOTAL PROTEIN: 7.5 g/dL (ref 6.0–8.3)

## 2014-01-09 LAB — TSH: TSH: 3.31 u[IU]/mL (ref 0.35–5.50)

## 2014-01-09 MED ORDER — ATENOLOL 50 MG PO TABS: ORAL_TABLET | ORAL | Status: DC

## 2014-01-09 NOTE — Progress Notes (Signed)
   Subjective:    Patient ID: Douglas Matthews, male    DOB: [birth year], 79 y.o.   MRN: [id number]  HPI Douglas Matthews is a 79 year old married male nonsmoker who comes in today for a Medicare wellness examination because of a history of hypertension and renal failure  His blood pressure today is 190/100 on Tenormin 50 mg daily. He says his blood pressure this morning at home was 160/80  He's due to see Dr. Salena Saner. his nephrologist on Wednesday of this week.  He gets routine eye care, dental care.... upper and lower dentures........ colonoscopy and GI recently normal  Vaccinations updated today by Fleet Contrasachel  Cognitive function normal he walks 30 minutes a day home health safety reviewed no issues identified, no guns in the house, he does have a health care power of attorney and living will.   Review of Systems  Constitutional: Negative.   HENT: Negative.   Eyes: Negative.   Respiratory: Negative.   Cardiovascular: Negative.   Gastrointestinal: Negative.   Endocrine: Negative.   Genitourinary: Negative.   Musculoskeletal: Negative.   Skin: Negative.   Allergic/Immunologic: Negative.   Neurological: Negative.   Hematological: Negative.   Psychiatric/Behavioral: Negative.        Objective:   Physical Exam  Nursing note and vitals reviewed. Constitutional: He is oriented to person, place, and time. He appears well-developed and well-nourished.  HENT:  Head: Normocephalic and atraumatic.  Right Ear: External ear normal.  Left Ear: External ear normal.  Nose: Nose normal.  Mouth/Throat: Oropharynx is clear and moist.  Bluish pigment of each cornea benign condition according to his ophthalmologist Dr. Mitzi DavenportBrewington  Eyes: Conjunctivae and EOM are normal. Pupils are equal, round, and reactive to light.  Neck: Normal range of motion. Neck supple. No JVD present. No tracheal deviation present. No thyromegaly present.  Cardiovascular: Normal rate, regular rhythm, normal heart sounds and intact  distal pulses.  Exam reveals no gallop and no friction rub.   No murmur heard.  No carotid or at bruits peripheral pulses 1+ and symmetrical  Pulmonary/Chest: Effort normal and breath sounds normal. No stridor. No respiratory distress. He has no wheezes. He has no rales. He exhibits no tenderness.  Abdominal: Soft. Bowel sounds are normal. He exhibits no distension and no mass. There is no tenderness. There is no rebound and no guarding.  Genitourinary: Rectum normal, prostate normal and penis normal. Guaiac negative stool. No penile tenderness.  Right testicle normal left testicle has ascended up into the inguinal canal. He's been seen in the past by urology a maze advised no therapy  Musculoskeletal: Normal range of motion. He exhibits no edema and no tenderness.  Lymphadenopathy:    He has no cervical adenopathy.  Neurological: He is alert and oriented to person, place, and time. He has normal reflexes. No cranial nerve deficit. He exhibits normal muscle tone.  Skin: Skin is warm and dry. No rash noted. No erythema. No pallor.  Psychiatric: He has a normal mood and affect. His behavior is normal. Judgment and thought content normal.          Assessment & Plan:  Hypertension not at goal  Renal failure,,,,,,,,,,,, BP check 3 times daily continue current medication followup with Dr. Salena Saner. on Wednesday of this week  Undescended left testicle observe  BPH observe  Anemia secondary to renal failure

## 2014-01-09 NOTE — Patient Instructions (Signed)
Check your blood pressure morning noon and evening  Take a copy of our your blood pressure readings with you to your appointment with Dr. see on Wednesday  In the meantime continue the Tenormin one a day...........Marland Kitchen. but at a half a tab at bedtime

## 2014-01-09 NOTE — Progress Notes (Signed)
Pre visit review using our clinic review tool, if applicable. No additional management support is needed unless otherwise documented below in the visit note. 

## 2014-01-10 ENCOUNTER — Telehealth: Payer: Self-pay | Admitting: Family Medicine

## 2014-01-10 NOTE — Telephone Encounter (Signed)
Relevant patient education mailed to patient.  

## 2014-01-11 DIAGNOSIS — D638 Anemia in other chronic diseases classified elsewhere: Secondary | ICD-10-CM | POA: Diagnosis not present

## 2014-01-11 DIAGNOSIS — N183 Chronic kidney disease, stage 3 unspecified: Secondary | ICD-10-CM | POA: Diagnosis not present

## 2014-01-11 DIAGNOSIS — I129 Hypertensive chronic kidney disease with stage 1 through stage 4 chronic kidney disease, or unspecified chronic kidney disease: Secondary | ICD-10-CM | POA: Diagnosis not present

## 2014-01-11 DIAGNOSIS — N2581 Secondary hyperparathyroidism of renal origin: Secondary | ICD-10-CM | POA: Diagnosis not present

## 2014-04-25 DIAGNOSIS — N183 Chronic kidney disease, stage 3 unspecified: Secondary | ICD-10-CM | POA: Diagnosis not present

## 2014-07-11 ENCOUNTER — Encounter: Payer: Self-pay | Admitting: Gastroenterology

## 2014-08-31 DIAGNOSIS — I10 Essential (primary) hypertension: Secondary | ICD-10-CM | POA: Diagnosis not present

## 2014-08-31 DIAGNOSIS — Z23 Encounter for immunization: Secondary | ICD-10-CM | POA: Diagnosis not present

## 2014-08-31 DIAGNOSIS — N183 Chronic kidney disease, stage 3 (moderate): Secondary | ICD-10-CM | POA: Diagnosis not present

## 2014-08-31 DIAGNOSIS — N2581 Secondary hyperparathyroidism of renal origin: Secondary | ICD-10-CM | POA: Diagnosis not present

## 2014-08-31 DIAGNOSIS — D638 Anemia in other chronic diseases classified elsewhere: Secondary | ICD-10-CM | POA: Diagnosis not present

## 2015-03-09 DIAGNOSIS — D638 Anemia in other chronic diseases classified elsewhere: Secondary | ICD-10-CM | POA: Diagnosis not present

## 2015-03-09 DIAGNOSIS — N2581 Secondary hyperparathyroidism of renal origin: Secondary | ICD-10-CM | POA: Diagnosis not present

## 2015-03-09 DIAGNOSIS — I10 Essential (primary) hypertension: Secondary | ICD-10-CM | POA: Diagnosis not present

## 2015-03-09 DIAGNOSIS — N183 Chronic kidney disease, stage 3 (moderate): Secondary | ICD-10-CM | POA: Diagnosis not present

## 2015-04-26 ENCOUNTER — Ambulatory Visit (INDEPENDENT_AMBULATORY_CARE_PROVIDER_SITE_OTHER): Payer: Medicare Other | Admitting: Family Medicine

## 2015-04-26 ENCOUNTER — Encounter: Payer: Self-pay | Admitting: Family Medicine

## 2015-04-26 VITALS — BP 134/84 | Temp 97.8°F | Ht 68.0 in | Wt 163.0 lb

## 2015-04-26 DIAGNOSIS — N19 Unspecified kidney failure: Secondary | ICD-10-CM

## 2015-04-26 DIAGNOSIS — N401 Enlarged prostate with lower urinary tract symptoms: Secondary | ICD-10-CM | POA: Diagnosis not present

## 2015-04-26 DIAGNOSIS — D509 Iron deficiency anemia, unspecified: Secondary | ICD-10-CM

## 2015-04-26 DIAGNOSIS — I10 Essential (primary) hypertension: Secondary | ICD-10-CM

## 2015-04-26 DIAGNOSIS — R351 Nocturia: Secondary | ICD-10-CM

## 2015-04-26 LAB — POCT URINALYSIS DIPSTICK
Bilirubin, UA: NEGATIVE
Blood, UA: NEGATIVE
GLUCOSE UA: NEGATIVE
KETONES UA: NEGATIVE
Leukocytes, UA: NEGATIVE
Nitrite, UA: NEGATIVE
Protein, UA: NEGATIVE
Spec Grav, UA: 1.015
Urobilinogen, UA: 0.2
pH, UA: 5.5

## 2015-04-26 LAB — BASIC METABOLIC PANEL
BUN: 41 mg/dL — ABNORMAL HIGH (ref 6–23)
CALCIUM: 9.3 mg/dL (ref 8.4–10.5)
CHLORIDE: 105 meq/L (ref 96–112)
CO2: 27 meq/L (ref 19–32)
CREATININE: 2.18 mg/dL — AB (ref 0.40–1.50)
GFR: 37.86 mL/min — ABNORMAL LOW (ref >60.00)
GLUCOSE: 90 mg/dL (ref 70–99)
Potassium: 4.3 mEq/L (ref 3.5–5.1)
SODIUM: 137 meq/L (ref 135–145)

## 2015-04-26 LAB — CBC WITH DIFFERENTIAL/PLATELET
BASOS ABS: 0 10*3/uL (ref 0.0–0.1)
BASOS PCT: 0.5 % (ref 0.0–3.0)
Eosinophils Absolute: 0.1 10*3/uL (ref 0.0–0.7)
Eosinophils Relative: 2.4 % (ref 0.0–5.0)
HCT: 31.6 % — ABNORMAL LOW (ref 39.0–52.0)
HEMOGLOBIN: 10.5 g/dL — AB (ref 13.0–17.0)
LYMPHS PCT: 23.4 % (ref 12.0–46.0)
Lymphs Abs: 1.2 10*3/uL (ref 0.7–4.0)
MCHC: 33.3 g/dL (ref 30.0–36.0)
MCV: 87.4 fl (ref 78.0–100.0)
MONO ABS: 0.8 10*3/uL (ref 0.1–1.0)
MONOS PCT: 14.9 % — AB (ref 3.0–12.0)
NEUTROS PCT: 58.8 % (ref 43.0–77.0)
Neutro Abs: 3.1 10*3/uL (ref 1.4–7.7)
PLATELETS: 252 10*3/uL (ref 150.0–400.0)
RBC: 3.62 Mil/uL — ABNORMAL LOW (ref 4.22–5.81)
RDW: 15.2 % (ref 11.5–15.5)
WBC: 5.2 10*3/uL (ref 4.0–10.5)

## 2015-04-26 LAB — TSH: TSH: 3.15 u[IU]/mL (ref 0.35–4.50)

## 2015-04-26 MED ORDER — AMLODIPINE BESYLATE 10 MG PO TABS: ORAL_TABLET | ORAL | Status: DC

## 2015-04-26 NOTE — Progress Notes (Signed)
Pre visit review using our clinic review tool, if applicable. No additional management support is needed unless otherwise documented below in the visit note. 

## 2015-04-26 NOTE — Patient Instructions (Signed)
Amlodipine 10 mg............. one tablet daily in the morning........ check your blood pressure daily in the morning about an hour or 2 after you take your medication........... if you need a new blood pressure cuff I would recommend a [employer] pump up digital blood pressure cuff from [employer]  Return in one month for follow-up........Marland Kitchen. bring a record of all your blood pressure readings and your blood pressure cuff with you to the next visit  Labs today we'll call you the report

## 2015-04-26 NOTE — Progress Notes (Signed)
   Subjective:    Patient ID: Douglas Matthews, male    DOB: 1935-10-29, 80 y.o.   MRN: [id number]  HPI Douglas Matthews is a 80 year old married male nonsmoker who comes in today for general physical examination because of a history of hypertension, renal insufficiency, anemia secondary to renal insufficiency, and BPH  He gets routine eye care, dental care no because he has upper and lower plates. Colonoscopy 2009 normal  Vaccinations up-to-date  He saw the nephrologist and April of this year. They stopped his beta blocker and put him on Norvasc 10 mg daily. Today BP 158/98.  Vaccinations up-to-date  Cognitive function normal he walks on a daily basis home health safety reviewed no issues identified, no guns in the house, he does have a healthcare power of attorney and living well.   Review of Systems  Constitutional: Negative.   HENT: Negative.   Eyes: Negative.   Respiratory: Negative.   Cardiovascular: Negative.   Gastrointestinal: Negative.   Endocrine: Negative.   Genitourinary: Negative.   Musculoskeletal: Negative.   Skin: Negative.   Allergic/Immunologic: Negative.   Neurological: Negative.   Hematological: Negative.   Psychiatric/Behavioral: Negative.        Objective:   Physical Exam  Constitutional: He is oriented to person, place, and time. He appears well-developed and well-nourished.  HENT:  Head: Normocephalic and atraumatic.  Right Ear: External ear normal.  Left Ear: External ear normal.  Nose: Nose normal.  Mouth/Throat: Oropharynx is clear and moist.  Eyes: Conjunctivae and EOM are normal. Pupils are equal, round, and reactive to light.  Neck: Normal range of motion. Neck supple. No JVD present. No tracheal deviation present. No thyromegaly present.  Cardiovascular: Normal rate, normal heart sounds and intact distal pulses.  Exam reveals no gallop and no friction rub.   No murmur heard. No carotid nor aortic bruits peripheral pulses 1+ and symmetrical  On  physical exam he has a irregular pulse. EKG shows some ectopic beats. Most of which are notable type beats. Asymptomatic  Pulmonary/Chest: Effort normal and breath sounds normal. No stridor. No respiratory distress. He has no wheezes. He has no rales. He exhibits no tenderness.  Abdominal: Soft. Bowel sounds are normal. He exhibits no distension and no mass. There is no tenderness. There is no rebound and no guarding.  Genitourinary: Rectum normal and penis normal. Guaiac negative stool. No penile tenderness.  2+ symmetrical nonnodular BPH  Musculoskeletal: Normal range of motion. He exhibits no edema or tenderness.  Lymphadenopathy:    He has no cervical adenopathy.  Neurological: He is alert and oriented to person, place, and time. He has normal reflexes. No cranial nerve deficit. He exhibits normal muscle tone.  Skin: Skin is warm and dry. No rash noted. No erythema. No pallor.  Psychiatric: He has a normal mood and affect. His behavior is normal. Judgment and thought content normal.  Nursing note and vitals reviewed.         Assessment & Plan:  Hypertension,,,,,,,,, BP not at goal,,,,,,,, check blood pressure daily return in one month for follow-up  History of renal insufficiency,,,,,,,,, followed by nephrology Dr. Kem BoroughsJC  BPH,,,,,, asymptomatic  History of anemia secondary to renal insufficiency

## 2015-10-10 LAB — HM DIABETES EYE EXAM

## 2015-10-24 ENCOUNTER — Encounter: Payer: Self-pay | Admitting: Family Medicine

## 2015-12-14 DIAGNOSIS — I10 Essential (primary) hypertension: Secondary | ICD-10-CM | POA: Diagnosis not present

## 2015-12-14 DIAGNOSIS — D638 Anemia in other chronic diseases classified elsewhere: Secondary | ICD-10-CM | POA: Diagnosis not present

## 2015-12-14 DIAGNOSIS — E875 Hyperkalemia: Secondary | ICD-10-CM | POA: Diagnosis not present

## 2015-12-14 DIAGNOSIS — N183 Chronic kidney disease, stage 3 (moderate): Secondary | ICD-10-CM | POA: Diagnosis not present

## 2015-12-14 DIAGNOSIS — N2581 Secondary hyperparathyroidism of renal origin: Secondary | ICD-10-CM | POA: Diagnosis not present

## 2015-12-14 DIAGNOSIS — Z9114 Patient's other noncompliance with medication regimen: Secondary | ICD-10-CM | POA: Diagnosis not present

## 2016-04-22 ENCOUNTER — Encounter: Payer: Self-pay | Admitting: Family Medicine

## 2016-04-22 ENCOUNTER — Ambulatory Visit (INDEPENDENT_AMBULATORY_CARE_PROVIDER_SITE_OTHER): Payer: Medicare Other | Admitting: Family Medicine

## 2016-04-22 DIAGNOSIS — N183 Chronic kidney disease, stage 3 unspecified: Secondary | ICD-10-CM | POA: Insufficient documentation

## 2016-04-22 DIAGNOSIS — N401 Enlarged prostate with lower urinary tract symptoms: Secondary | ICD-10-CM

## 2016-04-22 DIAGNOSIS — I129 Hypertensive chronic kidney disease with stage 1 through stage 4 chronic kidney disease, or unspecified chronic kidney disease: Secondary | ICD-10-CM | POA: Diagnosis not present

## 2016-04-22 DIAGNOSIS — N189 Chronic kidney disease, unspecified: Secondary | ICD-10-CM | POA: Insufficient documentation

## 2016-04-22 DIAGNOSIS — N179 Acute kidney failure, unspecified: Secondary | ICD-10-CM | POA: Insufficient documentation

## 2016-04-22 DIAGNOSIS — R351 Nocturia: Secondary | ICD-10-CM

## 2016-04-22 DIAGNOSIS — D649 Anemia, unspecified: Secondary | ICD-10-CM

## 2016-04-22 DIAGNOSIS — I499 Cardiac arrhythmia, unspecified: Secondary | ICD-10-CM

## 2016-04-22 DIAGNOSIS — R748 Abnormal levels of other serum enzymes: Secondary | ICD-10-CM

## 2016-04-22 DIAGNOSIS — I10 Essential (primary) hypertension: Secondary | ICD-10-CM | POA: Diagnosis not present

## 2016-04-22 DIAGNOSIS — E7800 Pure hypercholesterolemia, unspecified: Secondary | ICD-10-CM

## 2016-04-22 DIAGNOSIS — E785 Hyperlipidemia, unspecified: Secondary | ICD-10-CM | POA: Insufficient documentation

## 2016-04-22 LAB — LIPID PANEL
CHOLESTEROL: 314 mg/dL — AB (ref 0–200)
HDL: 49.7 mg/dL (ref >39.00)
LDL CALC: 239 mg/dL — AB (ref 0–99)
NonHDL: 264.75
TRIGLYCERIDES: 128 mg/dL (ref 0.0–149.0)
Total CHOL/HDL Ratio: 6
VLDL: 25.6 mg/dL (ref 0.0–40.0)

## 2016-04-22 LAB — COMPREHENSIVE METABOLIC PANEL
ALBUMIN: 4.4 g/dL (ref 3.5–5.2)
ALK PHOS: 145 U/L — AB (ref 39–117)
ALT: 13 U/L (ref 0–53)
AST: 17 U/L (ref 0–37)
BUN: 47 mg/dL — ABNORMAL HIGH (ref 6–23)
CALCIUM: 9.5 mg/dL (ref 8.4–10.5)
CHLORIDE: 107 meq/L (ref 96–112)
CO2: 27 mEq/L (ref 19–32)
Creatinine, Ser: 2.09 mg/dL — ABNORMAL HIGH (ref 0.40–1.50)
GFR: 39.65 mL/min — ABNORMAL LOW (ref >60.00)
Glucose, Bld: 84 mg/dL (ref 70–99)
POTASSIUM: 5 meq/L (ref 3.5–5.1)
Sodium: 140 mEq/L (ref 135–145)
TOTAL PROTEIN: 7.3 g/dL (ref 6.0–8.3)
Total Bilirubin: 0.4 mg/dL (ref 0.2–1.2)

## 2016-04-22 LAB — CBC
HCT: 34.2 % — ABNORMAL LOW (ref 39.0–52.0)
HEMOGLOBIN: 11.3 g/dL — AB (ref 13.0–17.0)
MCHC: 33 g/dL (ref 30.0–36.0)
MCV: 87.3 fl (ref 78.0–100.0)
PLATELETS: 252 10*3/uL (ref 150.0–400.0)
RBC: 3.91 Mil/uL — ABNORMAL LOW (ref 4.22–5.81)
RDW: 15.5 % (ref 11.5–15.5)
WBC: 5 10*3/uL (ref 4.0–10.5)

## 2016-04-22 LAB — FERRITIN: Ferritin: 26 ng/mL (ref 22.0–322.0)

## 2016-04-22 LAB — VITAMIN D 25 HYDROXY (VIT D DEFICIENCY, FRACTURES): VITD: 40.18 ng/mL (ref 30.00–100.00)

## 2016-04-22 LAB — PSA: PSA: 3.61 ng/mL (ref 0.10–4.00)

## 2016-04-22 MED ORDER — AMLODIPINE BESYLATE 10 MG PO TABS: ORAL_TABLET | ORAL | Status: DC

## 2016-04-22 NOTE — Patient Instructions (Addendum)
A few things to remember from today's visit:   1. Essential hypertension  Re-checked:150/92.  - amLODipine (NORVASC) 10 MG tablet; 1 tablet daily in the morning  Dispense: 90 tablet; Refill: 2 - EKG 12-Lead - Comprehensive metabolic panel  2. CKD (chronic kidney disease), stage III  - Comprehensive metabolic panel - VITAMIN D 25 Hydroxy (Vit-D Deficiency, Fractures) - CBC (no diff)  3. Hypertensive kidney disease with chronic kidney disease stage III   4. Anemia, unspecified  - CBC (no diff) - Ferritin  5. Pure hypercholesterolemia  - Lipid panel  6. Prostate cancer screening   7. Elevated alkaline phosphatase level  - Comprehensive metabolic panel  8. Cardiac arrhythmia, unspecified cardiac arrhythmia type  - EKG today abnormal but stable compared with last one in 04/2015. Monitor for warning signs as discussed.   Blood pressure goal  is less than 150/90,ideally 140/90 or less.Sine you are reporting home blood pressure readings less than 140/90, no changes today.  Elevated blood pressure increases the risk of strokes, heart and can worsen kidney disease, and eye problems. Regular physical activity and a healthy diet (DASH diet) usually help. Low salt diet.  To preserve kidney function and/or slow progression of disease:  Low salt diet and adequate hydration. Avoiding medicines known as "nonsteroidal anti-inflammatory drugs," or NSAIDs. These medicines include ibuprofen (sample brand names: Advil, Motrin) and naproxen (sample brand name: Aleve).  Adequate blood pressure control. Low phosphorus diet.  Continue following with kidney doctor, I did not order urine test today since you will see him in June.   A few tips:  -As we age balance is not as good as it was, so there is a higher risks for falls. Please remove small rugs and furniture that is "in your way" and could increase the risk of falls. Stretching exercises may help with fall prevention: Yoga and Tai  Chi are some examples. Low impact exercise is better, so you are not very achy the next day.  -Sun screen and avoidance of direct sun light recommended. Caution with dehydration, if working outdoors be sure to drink enough fluids.  -Healthy diet low in red meet/animal fat and sugar + regular physical activity is recommended.    An appt with Ms Manuela Schwartz, nurse, will be arranged for you routine Medicare preventive.   If you sign-up for My chart, you can communicate easier with [place] in case you have any question or concern.

## 2016-04-22 NOTE — Progress Notes (Signed)
Subjective:    Patient ID: Douglas Matthews, male    DOB: 12-Nov-1935, 81 y.o.   MRN: [id number]  HPI   [name] is a 81 y.o.male here today to establish care with me, former Dr Honor Junes pt. He lives with wife, independent ADL's and IADL's. Mild hearing loss left ear, otherwise stable, has had cerumen removal before.  He has Hx of HTN,CKD,anemia,and HLD among some. Last routine physical was 04/2015.  He tries to follow a healthy diet and does not exercises regularly but active with home chores.  Concerns today: needs refills on his medication and labs done.  Hypertension: Currently he is on Amlodipine 10 mg daily. He taking medications as instructed, no side effects reported. BP elevated today, he reports home BP readings:130's/80's.  He has not noted unusual headache, visual changes, exertional chest pain, dyspnea,  focal weakness, or edema.  Irregular HR noted today, he denies any orthopnea or PND. No sleep apnea.  EKG 04/2015 PVC's and voltage criteria for LVH.   Lab Results  Component Value Date   CREATININE 2.18* 04/26/2015   BUN 41* 04/26/2015   NA 137 04/26/2015   K 4.3 04/26/2015   CL 105 04/26/2015   CO2 27 04/26/2015   Hx of CKD III,he following with nephrologists q 6 months, last visit 12/2015. According to pt, Dr Sherren Mocha usually ordered all his blood lab work and his nephrologists can see results.  Nocturia x 1, stable. He denies urinary frequency, urine dribbling, or decreased urine stream. Noted elevated alk phos in past labs. Hx of BPH (listed on his problem list).   Lab Results  Component Value Date   ALT 15 01/09/2014   AST 20 01/09/2014   ALKPHOS 151* 01/09/2014   BILITOT 0.5 01/09/2014     Lab Results  Component Value Date   PSA 2.59 01/09/2014   PSA 2.31 12/15/2011   PSA 1.71 09/19/2010    Hx of hyperK+, so he is not on ACEI's or ARB's.  No gross hematuria or foam in urine.  HLD:  He is on non pharmacologic  treatment.   Lab Results  Component Value Date   CHOL 300* 12/15/2011   HDL 46.60 12/15/2011   LDLDIRECT 220.5 12/15/2011   TRIG 107.0 12/15/2011   CHOLHDL 6 12/15/2011     Review of Systems  Constitutional: Negative for fever, diaphoresis, activity change, appetite change, fatigue and unexpected weight change.  HENT: Negative for nosebleeds, sore throat and trouble swallowing.   Eyes: Negative for redness and visual disturbance.  Respiratory: Negative for apnea, cough, shortness of breath and wheezing.   Cardiovascular: Negative for chest pain, palpitations and leg swelling.  Gastrointestinal: Negative for nausea, vomiting, abdominal pain and blood in stool.       No changes in bowel habits.  Genitourinary: Negative for dysuria, frequency, hematuria and decreased urine volume.       + Nocturia x 1, stable.  Musculoskeletal: Negative for myalgias, back pain and gait problem.  Skin: Negative for color change and rash.  Neurological: Negative for dizziness, seizures, syncope, weakness, numbness and headaches.  Psychiatric/Behavioral: Negative for behavioral problems and sleep disturbance. The patient is not nervous/anxious.      Current Outpatient Prescriptions on File Prior to Visit  Medication Sig Dispense Refill  . aspirin 81 MG tablet Take 81 mg by mouth daily.      No current facility-administered medications on file prior to visit.     Past Medical History  Diagnosis Date  .  Hyperlipidemia   . Anemia   . Diverticulosis   . Hypertension   . BPH (benign prostatic hypertrophy)     Social History   Social History  . Marital Status: Married    Spouse Name: N/A  . Number of Children: N/A  . Years of Education: N/A   Social History Main Topics  . Smoking status: Never Smoker   . Smokeless tobacco: None  . Alcohol Use: No  . Drug Use: No  . Sexual Activity: Not Asked   Other Topics Concern  . None   Social History Narrative    Filed Vitals:   04/22/16  0958  BP: 150/90  Pulse: 96  Temp: 98.1 F (36.7 C)  Resp: 12   Body mass index is 24.9 kg/(m^2).  SpO2 Readings from Last 3 Encounters:  04/22/16 98%  12/09/12 100%        Objective:   Physical Exam  Constitutional: He is oriented to person, place, and time. He appears well-developed and well-nourished. No distress.  HENT:  Head: Atraumatic.  Mouth/Throat: Oropharynx is clear and moist and mucous membranes are normal. He has dentures.  Eyes: Conjunctivae and EOM are normal. Pupils are equal, round, and reactive to light.  Neck: No JVD present. No thyromegaly present.  Cardiovascular: Normal rate.  An irregular rhythm present.  No murmur heard. Pulses:      Dorsalis pedis pulses are 2+ on the right side, and 2+ on the left side.  Pulmonary/Chest: Effort normal. He has no wheezes. He has no rales.  Abdominal: Soft. He exhibits no mass. There is no hepatomegaly. There is no tenderness.  Musculoskeletal: He exhibits no edema or tenderness.  Lymphadenopathy:    He has no cervical adenopathy.  Neurological: He is alert and oriented to person, place, and time. He has normal strength. Coordination and gait normal.  Skin: Skin is warm. No rash noted. No erythema.  Psychiatric: He has a normal mood and affect.  Well groomed, good eye contact.       Assessment & Plan:    Varian was seen today for establish care.  Diagnoses and all orders for this visit:  Essential hypertension  Elevated today. Since he is reporting adequate BP's at home and given his age, no changes in current management. Low salt diet. Monitor BP at home, instructed to be sure he has the right cuff size. Eye exam recommended annually. F/U in 4 months, before if needed.   -     amLODipine (NORVASC) 10 MG tablet; 1 tablet daily in the morning -     EKG 12-Lead -     Comprehensive metabolic panel  Hypertensive kidney disease with chronic kidney disease stage III  He will continue following with  nephrologists. Next appointment in June or July this year, he would like labs done today which can be reviewed by nephrologist. No changes in current management, he has prior history of hyperkalemia so currently he is not on ACE inhibitor or ARB's. Low salt and phosphate diet recommended as well as avoidance of NSAID's.  -     Comprehensive metabolic panel -     VITAMIN D 25 Hydroxy (Vit-D Deficiency, Fractures) -     CBC (no diff)    Anemia, unspecified  Most likely related with renal disease, chronic disease. Further recommendations would be given according to lab results.  -     CBC (no diff) -     Ferritin  Pure hypercholesterolemia  Continue low-fat diet. Further recommendations  in regard to dermatology treatment would be given according to lab results.  -     Lipid panel    Elevated alkaline phosphatase level  He has been stable for years, we discussed possible causes and might decide about further workup if worsening.  -     Comprehensive metabolic panel   Cardiac arrhythmia, unspecified cardiac arrhythmia type  He seems chronic, asymptomatic. TSH in 04/2015 normal.  EKG done today show SR, normal axis, PVCs, voltage criteria for LVH,? of early repolarization, no signs of acute ischemia.  We discussed possible causes of PVC, including ischemic disease, he is not interested in further workup or cardiologist evaluation. I would like to obtain an echocardiogram, he is not sure but tells me to arrange if I consider it necessary.2D Echo order placed. He was clearly instructed about warning signs, he voices understanding.  -     EKG 12-Lead   BPH associated with nocturia  After discussion of current guidelines for prostate cancer screening and and PSA, he would like to have it checked. He denies worsening symptoms of BPH, so for now no pharmacologic treatment. I might consider alpha agonist (Doxazosin) if needed for better BP control.    -Patient advised to  return or notify a doctor immediately if symptoms worsen or persist or new concerns arise.     [name]. [place], MD  [facility] [place] office.

## 2016-04-23 ENCOUNTER — Other Ambulatory Visit (INDEPENDENT_AMBULATORY_CARE_PROVIDER_SITE_OTHER): Payer: Medicare Other

## 2016-04-23 DIAGNOSIS — I469 Cardiac arrest, cause unspecified: Secondary | ICD-10-CM

## 2016-04-23 LAB — MAGNESIUM: Magnesium: 2.1 mg/dL (ref 1.5–2.5)

## 2016-05-06 ENCOUNTER — Other Ambulatory Visit: Payer: Self-pay

## 2016-05-06 ENCOUNTER — Ambulatory Visit (HOSPITAL_COMMUNITY): Payer: Medicare Other | Attending: Cardiology

## 2016-05-06 DIAGNOSIS — E785 Hyperlipidemia, unspecified: Secondary | ICD-10-CM | POA: Diagnosis not present

## 2016-05-06 DIAGNOSIS — I340 Nonrheumatic mitral (valve) insufficiency: Secondary | ICD-10-CM | POA: Insufficient documentation

## 2016-05-06 DIAGNOSIS — I499 Cardiac arrhythmia, unspecified: Secondary | ICD-10-CM | POA: Diagnosis not present

## 2016-05-06 DIAGNOSIS — I119 Hypertensive heart disease without heart failure: Secondary | ICD-10-CM | POA: Insufficient documentation

## 2016-05-06 DIAGNOSIS — I10 Essential (primary) hypertension: Secondary | ICD-10-CM

## 2016-05-06 DIAGNOSIS — R9431 Abnormal electrocardiogram [ECG] [EKG]: Secondary | ICD-10-CM | POA: Diagnosis present

## 2016-05-16 ENCOUNTER — Encounter: Payer: Self-pay | Admitting: Family Medicine

## 2016-05-28 DIAGNOSIS — H35033 Hypertensive retinopathy, bilateral: Secondary | ICD-10-CM | POA: Diagnosis not present

## 2016-05-28 DIAGNOSIS — H1851 Endothelial corneal dystrophy: Secondary | ICD-10-CM | POA: Diagnosis not present

## 2016-05-28 DIAGNOSIS — H40013 Open angle with borderline findings, low risk, bilateral: Secondary | ICD-10-CM | POA: Diagnosis not present

## 2016-05-28 DIAGNOSIS — H2513 Age-related nuclear cataract, bilateral: Secondary | ICD-10-CM | POA: Diagnosis not present

## 2016-08-18 ENCOUNTER — Encounter: Payer: Self-pay | Admitting: Family Medicine

## 2016-08-18 ENCOUNTER — Ambulatory Visit (INDEPENDENT_AMBULATORY_CARE_PROVIDER_SITE_OTHER): Payer: Medicare Other | Admitting: Family Medicine

## 2016-08-18 VITALS — BP 145/80 | HR 92 | Resp 12 | Ht 67.0 in | Wt 162.4 lb

## 2016-08-18 DIAGNOSIS — I10 Essential (primary) hypertension: Secondary | ICD-10-CM | POA: Diagnosis not present

## 2016-08-18 DIAGNOSIS — I5189 Other ill-defined heart diseases: Secondary | ICD-10-CM | POA: Insufficient documentation

## 2016-08-18 DIAGNOSIS — R748 Abnormal levels of other serum enzymes: Secondary | ICD-10-CM | POA: Diagnosis not present

## 2016-08-18 DIAGNOSIS — I519 Heart disease, unspecified: Secondary | ICD-10-CM | POA: Diagnosis not present

## 2016-08-18 DIAGNOSIS — E785 Hyperlipidemia, unspecified: Secondary | ICD-10-CM

## 2016-08-18 LAB — LIPID PANEL
CHOLESTEROL: 287 mg/dL — AB (ref 0–200)
HDL: 52.2 mg/dL (ref >39.00)
LDL CALC: 217 mg/dL — AB (ref 0–99)
NonHDL: 234.65
TRIGLYCERIDES: 90 mg/dL (ref 0.0–149.0)
Total CHOL/HDL Ratio: 5
VLDL: 18 mg/dL (ref 0.0–40.0)

## 2016-08-18 LAB — HEPATIC FUNCTION PANEL
ALT: 16 U/L (ref 0–53)
AST: 18 U/L (ref 0–37)
Albumin: 4.1 g/dL (ref 3.5–5.2)
Alkaline Phosphatase: 162 U/L — ABNORMAL HIGH (ref 39–117)
Bilirubin, Direct: 0 mg/dL (ref 0.0–0.3)
TOTAL PROTEIN: 7.3 g/dL (ref 6.0–8.3)
Total Bilirubin: 0.3 mg/dL (ref 0.2–1.2)

## 2016-08-18 LAB — GAMMA GT: GGT: 26 U/L (ref 7–51)

## 2016-08-18 NOTE — Progress Notes (Signed)
Pre visit review using our clinic review tool, if applicable. No additional management support is needed unless otherwise documented below in the visit note. 

## 2016-08-18 NOTE — Patient Instructions (Addendum)
A few things to remember from today's visit:   Essential hypertension  Elevated alkaline phosphatase level - Plan: Gamma GT, Hepatic Function Panel  Hyperlipidemia - Plan: Lipid panel  Strongly recommend to consider statin medication. Continue monitoring blood pressure at home Keep appointment with Kidney doctor.  Blood pressure monitor seems to be accurate, so no changes today.  Please be sure medication list is accurate. If a new problem present, please set up appointment sooner than planned today.

## 2016-08-18 NOTE — Progress Notes (Signed)
HPI:   Douglas Matthews is a 81 y.o. male, who is here today to follow on some of his chronic medical problems.     Hypertension:   He checks his BP at home and usually "good", today he brought his BP monitor. BP < 140/90 most. Currently on amlodipine 10 mg daily.    He is taking medications as instructed, no side effects reported.  He has not noted unusual headache, visual changes, exertional chest pain, dyspnea,  focal weakness, or edema.   Lab Results  Component Value Date   CREATININE 2.09 (H) 04/22/2016   BUN 47 (H) 04/22/2016   NA 140 04/22/2016   K 5.0 04/22/2016   CL 107 04/22/2016   CO2 27 04/22/2016     Hx of CKD III,he following with nephrologists q 6 months, next appointment in 1-2 weeks. He denies gross hematuria or foam in the urine.  Last OV I noted irregular HR, he denies any palpitation, dizziness, orthopnea, or PND. No Hx of OSA and no witness apnea or louder snoring.    Echo in 05/2016:  -There was mild focal  basal hypertrophy of the septum.  LVEF 50%. Wall motion was normal;  there were no regional wall motion abnormalities. There was an  increased relative contribution of atrial contraction to ventricular filling. Doppler parameters are consistent with abnormal left ventricular relaxation (grade 1 diastolic   dysfunction). - Mitral valve: There was trivial regurgitation. - Pulmonary arteries: PA peak pressure: 35 mm Hg (S), mild pulmonary HT.   Hyperlipidemia:  Currently on non pharmacologic. In 03/2016 it was recommended trying stain medication but refused.  Following a low fat diet: Yes.  He is not sure if he has taken medication in the past.   Lab Results  Component Value Date   CHOL 314 (H) 04/22/2016   HDL 49.70 04/22/2016   LDLCALC 239 (H) 04/22/2016   LDLDIRECT 220.5 12/15/2011   TRIG 128.0 04/22/2016   CHOLHDL 6 04/22/2016    Also history of elevated alkaline phosphatase, he denies any unusual arthralgias or  myalgia, no abdominal pain. PSA has been in normal range but steadily going up for the past 3 years, he denies any change in urinary frequency or nocturia, has symptoms of BPH, stable.  Lab Results  Component Value Date   ALT 13 04/22/2016   AST 17 04/22/2016   ALKPHOS 145 (H) 04/22/2016   BILITOT 0.4 04/22/2016     Review of Systems  Constitutional: Negative for activity change, appetite change, fatigue, fever and unexpected weight change.  HENT: Negative for nosebleeds, sore throat and trouble swallowing.   Eyes: Negative for pain, redness and visual disturbance.  Respiratory: Negative for apnea, cough, shortness of breath and wheezing.   Cardiovascular: Negative for chest pain, palpitations and leg swelling.  Gastrointestinal: Negative for abdominal pain, nausea and vomiting.  Endocrine: Negative for polydipsia, polyphagia and polyuria.  Genitourinary: Negative for decreased urine volume, dysuria and hematuria.  Musculoskeletal: Negative for back pain, gait problem and myalgias.  Neurological: Negative for dizziness, seizures, weakness, numbness and headaches.  Hematological: Negative for adenopathy. Does not bruise/bleed easily.  Psychiatric/Behavioral: Negative for confusion and sleep disturbance. The patient is not nervous/anxious.       Current Outpatient Prescriptions on File Prior to Visit  Medication Sig Dispense Refill  . amLODipine (NORVASC) 10 MG tablet 1 tablet daily in the morning 90 tablet 2  . aspirin 81 MG tablet Take 81 mg by mouth daily.  No current facility-administered medications on file prior to visit.      Past Medical History:  Diagnosis Date  . Anemia   . BPH (benign prostatic hypertrophy)   . Diverticulosis   . Hyperlipidemia   . Hypertension    No Known Allergies  Social History   Social History  . Marital status: Married    Spouse name: N/A  . Number of children: N/A  . Years of education: N/A   Social History Main Topics  .  Smoking status: Never Smoker  . Smokeless tobacco: None  . Alcohol use No  . Drug use: No  . Sexual activity: Not Asked   Other Topics Concern  . None   Social History Narrative  . None    Vitals:   08/18/16 0849  BP: (!) 145/80  Pulse: 92  Resp: 12   O2 sat at RA 96%.  Body mass index is 25.43 kg/m.    Physical Exam  Nursing note and vitals reviewed. Constitutional: He is oriented to person, place, and time. He appears well-developed and well-nourished. No distress.  HENT:  Head: Atraumatic.  Mouth/Throat: Oropharynx is clear and moist and mucous membranes are normal.  Eyes: Conjunctivae and EOM are normal. Pupils are equal, round, and reactive to light.  Neck: No hepatojugular reflux and no JVD present. No thyroid mass and no thyromegaly present.  Cardiovascular: Normal rate.  An irregular rhythm present.  No murmur heard. DP pulses present bilateral  Respiratory: Effort normal and breath sounds normal. No respiratory distress.  GI: Soft. He exhibits no mass. There is no hepatomegaly. There is no tenderness.  Musculoskeletal: He exhibits no edema or tenderness.  Lymphadenopathy:    He has no cervical adenopathy.  Neurological: He is alert and oriented to person, place, and time. He has normal strength. Coordination normal.  Stable gait without assistance  Skin: Skin is warm. No erythema.  Psychiatric: He has a normal mood and affect.  Well groomed, good eye contact.      ASSESSMENT AND PLAN:     Douglas Matthews was seen today for follow-up.  Diagnoses and all orders for this visit:  Essential hypertension  Adequately controlled base on home BP readings, < 140/90. Today we checked BP with his BP monitor and it was accurate.  No changes in current management. DASH- low salt diet recommended. Eye exam recommended annually. F/U in 12 months, before if needed, since he is also following with nephrologists q 6 months.  Elevated alkaline phosphatase  level  Chronic. We discussed possible causes, he agrees with having labs today and I will make further recommendations accordingly.  -     Gamma GT -     Hepatic Function Panel  Hyperlipidemia  He tells me that he is not interested in starting another medication, he still would like to check lipid panel to see if he has change since last lab. We discussed in detail adverse effects of elevated cholesterol given the fact he has other CV risk factors as hypertension and chronic kidney disease. Also discussed benefits or stains as well as some side effects. Continue low-fat diet. Further recommendations will be given according to lab results.  -     Lipid panel  Diastolic dysfunction  Asymptomatic. Due to CKD I do not want to add ACEI or ARB. He may benefit from low dose BB given his Hx of irregular HR but he is not interested in taking more medications. He is not interested in referral to cardiologists. Low  salt diet. Adequate hydration, caution with fluid intake. No changes in current antihypertensive therapy.       -Douglas Matthews was advised to return sooner than planned today if new concerns arise.       Clydette Privitera G. [place], MD  [facility]. [place] office.

## 2016-08-24 ENCOUNTER — Encounter: Payer: Self-pay | Admitting: Family Medicine

## 2016-08-26 DIAGNOSIS — Z23 Encounter for immunization: Secondary | ICD-10-CM | POA: Diagnosis not present

## 2016-08-26 DIAGNOSIS — N2581 Secondary hyperparathyroidism of renal origin: Secondary | ICD-10-CM | POA: Diagnosis not present

## 2016-08-26 DIAGNOSIS — E875 Hyperkalemia: Secondary | ICD-10-CM | POA: Diagnosis not present

## 2016-08-26 DIAGNOSIS — Z9114 Patient's other noncompliance with medication regimen: Secondary | ICD-10-CM | POA: Diagnosis not present

## 2016-08-26 DIAGNOSIS — I10 Essential (primary) hypertension: Secondary | ICD-10-CM | POA: Diagnosis not present

## 2016-08-26 DIAGNOSIS — D638 Anemia in other chronic diseases classified elsewhere: Secondary | ICD-10-CM | POA: Diagnosis not present

## 2016-08-26 DIAGNOSIS — N183 Chronic kidney disease, stage 3 (moderate): Secondary | ICD-10-CM | POA: Diagnosis not present

## 2016-08-26 LAB — HEPATIC FUNCTION PANEL
ALK PHOS: 175 U/L — AB (ref 25–125)
ALT: 13 U/L (ref 10–40)
AST: 19 U/L (ref 14–40)
BILIRUBIN, TOTAL: 0.2 mg/dL

## 2016-08-26 LAB — CBC AND DIFFERENTIAL
HCT: 32 % — AB (ref 41–53)
HEMOGLOBIN: 10.9 g/dL — AB (ref 13.5–17.5)
Platelets: 285 10*3/uL (ref 150–399)
WBC: 3.8 10^3/mL

## 2016-08-26 LAB — BASIC METABOLIC PANEL
BUN: 47 mg/dL — AB (ref 4–21)
Creatinine: 2.4 mg/dL — AB (ref 0.6–1.3)
Glucose: 89 mg/dL
POTASSIUM: 5.2 mmol/L (ref 3.4–5.3)
SODIUM: 136 mmol/L — AB (ref 137–147)

## 2016-09-04 ENCOUNTER — Other Ambulatory Visit (HOSPITAL_COMMUNITY): Payer: Self-pay | Admitting: *Deleted

## 2016-09-05 ENCOUNTER — Ambulatory Visit (HOSPITAL_COMMUNITY)
Admission: RE | Admit: 2016-09-05 | Discharge: 2016-09-05 | Disposition: A | Discharge status: Home / Self Care | Payer: Medicare Other | Source: Ambulatory Visit | Attending: Nephrology | Admitting: Nephrology

## 2016-09-05 DIAGNOSIS — D509 Iron deficiency anemia, unspecified: Secondary | ICD-10-CM | POA: Diagnosis not present

## 2016-09-05 MED ORDER — SODIUM CHLORIDE 0.9 % IV SOLN
510.0000 mg | INTRAVENOUS | Status: DC
  Administered 2016-09-05: 510 mg via INTRAVENOUS
  Filled 2016-09-05: qty 17

## 2016-09-05 NOTE — Discharge Instructions (Signed)

## 2016-09-11 ENCOUNTER — Ambulatory Visit (HOSPITAL_COMMUNITY)
Admission: RE | Admit: 2016-09-11 | Discharge: 2016-09-11 | Disposition: A | Discharge status: Home / Self Care | Payer: Medicare Other | Source: Ambulatory Visit | Attending: Nephrology | Admitting: Nephrology

## 2016-09-11 DIAGNOSIS — N189 Chronic kidney disease, unspecified: Secondary | ICD-10-CM | POA: Insufficient documentation

## 2016-09-11 DIAGNOSIS — D631 Anemia in chronic kidney disease: Secondary | ICD-10-CM | POA: Diagnosis not present

## 2016-09-11 MED ORDER — SODIUM CHLORIDE 0.9 % IV SOLN
510.0000 mg | INTRAVENOUS | Status: AC
  Administered 2016-09-11: 510 mg via INTRAVENOUS
  Filled 2016-09-11: qty 17

## 2016-09-11 MED ORDER — SODIUM CHLORIDE 0.9 % IV SOLN
510.0000 mg | INTRAVENOUS | Status: DC
  Filled 2016-09-11: qty 17

## 2017-03-23 DIAGNOSIS — N2581 Secondary hyperparathyroidism of renal origin: Secondary | ICD-10-CM | POA: Diagnosis not present

## 2017-03-23 DIAGNOSIS — E875 Hyperkalemia: Secondary | ICD-10-CM | POA: Diagnosis not present

## 2017-03-23 DIAGNOSIS — D638 Anemia in other chronic diseases classified elsewhere: Secondary | ICD-10-CM | POA: Diagnosis not present

## 2017-03-23 DIAGNOSIS — N183 Chronic kidney disease, stage 3 (moderate): Secondary | ICD-10-CM | POA: Diagnosis not present

## 2017-03-23 DIAGNOSIS — Z9114 Patient's other noncompliance with medication regimen: Secondary | ICD-10-CM | POA: Diagnosis not present

## 2017-03-23 DIAGNOSIS — I10 Essential (primary) hypertension: Secondary | ICD-10-CM | POA: Diagnosis not present

## 2017-04-11 ENCOUNTER — Other Ambulatory Visit: Payer: Self-pay | Admitting: Family Medicine

## 2017-04-11 DIAGNOSIS — I10 Essential (primary) hypertension: Secondary | ICD-10-CM

## 2017-04-16 NOTE — Progress Notes (Signed)
HPI:   [name] is a 82 y.o. male, who is here today to follow on some chronic medical problems.  He follows with nephrologists every 6 months, so last OV we agreed on annual f/u's with me. Last time he followed with nephrologists was 03/2017, labs were done then.   Hypertension:   + CKD III and diastolic dysfunction. Currently on Amlodipine 10 mg.  Home BP's: 120-130's/70's.  He is taking medications as instructed and he has not noted side effects.  Denies headache, visual changes, exertional chest pain, dyspnea,  focal weakness, or edema.   Lab Results  Component Value Date   CREATININE 2.4 (A) 08/26/2016   BUN 47 (A) 08/26/2016   NA 136 (A) 08/26/2016   K 5.2 08/26/2016   CL 107 04/22/2016   CO2 27 04/22/2016     Hyperlipidemia:  Currently on non pharmacologic treatment. He has refused statin treatment.  Following a low fat diet: Yes.   Lab Results  Component Value Date   CHOL 287 (H) 08/18/2016   HDL 52.20 08/18/2016   LDLCALC 217 (H) 08/18/2016   LDLDIRECT 220.5 12/15/2011   TRIG 90.0 08/18/2016   CHOLHDL 5 08/18/2016    Hx of elevated alk phosphatase. GGT 08/2016 in normal range.  Lab Results  Component Value Date   ALT 13 08/26/2016   AST 19 08/26/2016   ALKPHOS 175 (A) 08/26/2016   BILITOT 0.3 08/18/2016    Denies abdominal pain, nausea, vomiting, changes in bowel habits, blood in stool or melena. He denies urinary frequency or nocturia. Denies back pain or rectal pain.  He has no concerns today.  Lab Results  Component Value Date   PSA 3.61 04/22/2016   PSA 2.59 01/09/2014   PSA 2.31 12/15/2011     Review of Systems  Constitutional: Negative for activity change, appetite change, fatigue, fever and unexpected weight change.  HENT: Negative for nosebleeds, sore throat and trouble swallowing.   Eyes: Negative for redness and visual disturbance.  Respiratory: Negative for apnea, cough, shortness of breath and  wheezing.   Cardiovascular: Negative for chest pain, palpitations and leg swelling.  Gastrointestinal: Negative for abdominal pain, blood in stool, nausea and vomiting.       Denies changes in bowel habits.  Genitourinary: Negative for decreased urine volume, dysuria, frequency and hematuria.  Musculoskeletal: Negative for back pain, gait problem and myalgias.  Skin: Negative for rash.  Neurological: Negative for syncope, weakness and headaches.      Current Outpatient Prescriptions on File Prior to Visit  Medication Sig Dispense Refill  . amLODipine (NORVASC) 10 MG tablet TAKE ONE TABLET BY MOUTH IN THE MORNING 90 tablet 1  . aspirin 81 MG tablet Take 81 mg by mouth daily.      No current facility-administered medications on file prior to visit.      Past Medical History:  Diagnosis Date  . Anemia   . BPH (benign prostatic hypertrophy)   . Diverticulosis   . Hyperlipidemia   . Hypertension    No Known Allergies  Social History   Social History  . Marital status: Married    Spouse name: N/A  . Number of children: N/A  . Years of education: N/A   Social History Main Topics  . Smoking status: Never Smoker  . Smokeless tobacco: Never Used  . Alcohol use No  . Drug use: No  . Sexual activity: Not on file   Other Topics Concern  . Not  on file   Social History Narrative   Married x 50 years     Vitals:   04/17/17 0840 04/17/17 0942  BP: (!) 146/66 138/75  Pulse: 78   Resp: 12   O2 sat at RA 98% Body mass index is 24.26 kg/m.   Physical Exam  Nursing note and vitals reviewed. Constitutional: He is oriented to person, place, and time. He appears well-developed and well-nourished. No distress.  HENT:  Head: Atraumatic.  Mouth/Throat: Oropharynx is clear and moist and mucous membranes are normal.  Eyes: Conjunctivae and EOM are normal. Pupils are equal, round, and reactive to light.  Cardiovascular: Normal rate and regular rhythm.   No murmur heard. DP  pulses present bilateral  Respiratory: Effort normal and breath sounds normal. No respiratory distress.  GI: Soft. He exhibits no mass. There is no hepatomegaly. There is no tenderness.  Musculoskeletal: He exhibits no edema or tenderness.  Lymphadenopathy:    He has no cervical adenopathy.  Neurological: He is alert and oriented to person, place, and time. He has normal strength.  Stable gait without assistance  Skin: Skin is warm. No erythema.  Psychiatric: He has a normal mood and affect.  Well groomed, good eye contact.     ASSESSMENT AND PLAN:   Douglas Matthews was seen today for medicare wellness.  Diagnoses and all orders for this visit:  Hyperlipidemia, unspecified hyperlipidemia type  Plan discussed benefits and side effects of statin medications, we review prior lipid panels. He will continue low-fat diet. He promised me to consider treatment with statin medications depending of lab results. F/U in 6-12 months.   -     Lipid panel  Hypertensive kidney disease with chronic kidney disease stage III  Adequately controlled. No changes in current management. DASH-low salt diet recommended. Eye exam recommended annually, last one 03/2016. F/U in 6 months with nephrologists and I will see him back in a year unless labs are abnormal.  Elevated alkaline phosphatase level  Asymptomatic. Further recommendations will be given according to lab results.  -     Hepatic function panel -     PSA  Abnormal PSA  PSA steadily going up. He denies urinary symptoms, so rectal exam was not done today. But given his elevated alk phosphate I feel like it is warrant to continue following.  -     PSA    -[name] was advised to return sooner than planned today if new concerns arise.       Cecillia Menees G. [place], MD  [facility]. [place] office.

## 2017-04-17 ENCOUNTER — Encounter: Payer: Self-pay | Admitting: Family Medicine

## 2017-04-17 ENCOUNTER — Other Ambulatory Visit (INDEPENDENT_AMBULATORY_CARE_PROVIDER_SITE_OTHER): Payer: Medicare Other

## 2017-04-17 ENCOUNTER — Ambulatory Visit (INDEPENDENT_AMBULATORY_CARE_PROVIDER_SITE_OTHER): Payer: Medicare Other | Admitting: Family Medicine

## 2017-04-17 VITALS — BP 138/75 | HR 78 | Resp 12 | Ht 68.0 in | Wt 159.6 lb

## 2017-04-17 DIAGNOSIS — R972 Elevated prostate specific antigen [PSA]: Secondary | ICD-10-CM

## 2017-04-17 DIAGNOSIS — I129 Hypertensive chronic kidney disease with stage 1 through stage 4 chronic kidney disease, or unspecified chronic kidney disease: Secondary | ICD-10-CM

## 2017-04-17 DIAGNOSIS — N183 Chronic kidney disease, stage 3 (moderate): Secondary | ICD-10-CM

## 2017-04-17 DIAGNOSIS — E785 Hyperlipidemia, unspecified: Secondary | ICD-10-CM

## 2017-04-17 DIAGNOSIS — R748 Abnormal levels of other serum enzymes: Secondary | ICD-10-CM

## 2017-04-17 LAB — LIPID PANEL
CHOLESTEROL: 292 mg/dL — AB (ref 0–200)
HDL: 48.7 mg/dL (ref >39.00)
LDL Cholesterol: 217 mg/dL — ABNORMAL HIGH (ref 0–99)
NonHDL: 243.43
Total CHOL/HDL Ratio: 6
Triglycerides: 130 mg/dL (ref 0.0–149.0)
VLDL: 26 mg/dL (ref 0.0–40.0)

## 2017-04-17 LAB — HEPATIC FUNCTION PANEL
ALBUMIN: 4.4 g/dL (ref 3.5–5.2)
ALT: 13 U/L (ref 0–53)
AST: 18 U/L (ref 0–37)
Alkaline Phosphatase: 150 U/L — ABNORMAL HIGH (ref 39–117)
Bilirubin, Direct: 0.1 mg/dL (ref 0.0–0.3)
Total Bilirubin: 0.4 mg/dL (ref 0.2–1.2)
Total Protein: 7.6 g/dL (ref 6.0–8.3)

## 2017-04-17 NOTE — Progress Notes (Signed)
Subjective:   Douglas Matthews is a 82 y.o. male who presents for Medicare Annual/Subsequent preventive examination.  HRA assessment completed during this visit with Douglas Matthews, Douglas Matthews  The Patient was informed that the wellness visit is to identify future health risk and educate and initiate measures that can reduce risk for increased disease through the lifespan.    Psychosocial: Married 50 years 2 sons; one in [place] and one in [place] 3 grandchildren  NO ROS; Medicare Wellness Visit Last OV:  today Labs completed: 08/24/2016 Lipids chol 287; HDL 52; LDL 217 and trig 90  Diabetes neg  Describes health as fair, good or great? Good   HM PSA 03/2016 3.61 Colonoscopy 08/2008    Update:  Tobacco: Never smoked  2nd Hand Smoke no Chew or electronic cigarettes- no ETOH: - none  BP runs 128 to 134 / 70 at home Npo today;   Medications; limited  OTC meds; none  BMI: 24  Diet; mostly eat at home Breakfast; sometimes Eat x 2 per day - mostly vegetables and chicken   Issues with teeth; no; he has dentures   Exercise;  walk x 3 miles - 3 times a week 15 minute miles x 3  Cut grass  Safety features reviewed for safe community;  firearms if in the home; keep in safe place  smoke alarms; yes  sun protection when outside; walks early in the am Wears a hat when mowing driving difficulties or accidents no   Advanced Directive / completed; to bring a copy for the chart  Hearing Screening Comments: Some hearing issues;  Some left ear issues  Can have a hearing screen  Vision Screening Comments: Vision checks x 1 a year  no issues with his eyes Vision good with glasses [name]    HOME SAFETY;  Fall hx;no Given education on "Fall Prevention in the Home" for more safety tips the patient can apply as appropriate.  Long term goal is to "age in place"    Mental Health:  Any emotional problems? Anxious, depressed, irritable, sad or blue? no Denies feeling depressed or  hopeless; voices pleasure in daily life How many social activities have you been engaged in within the last 2 weeks? no Who would help you with chores; illness; shopping other?  Cognitive;  Manages checkbook, medications; no failures of task Ad8 score reviewed for issues;  Issues making decisions; no  Less interest in hobbies / activities" no  Repeats questions, stories; family complaining: NO  Trouble using ordinary gadgets; microwave; computer: no  Forgets the month or year: no  Mismanaging finances: no  Missing apt: no but does write them down  Daily problems with thinking of memory NO Ad8 score is 0   Sleeps well - yes:    Immunizations Due: (Vaccines reviewed and educated regarding any overdue)  Educated regarding shingrix  Patient Care Team: [place], [name], MD as PCP - General (Family Medicine)  Sees kidney doctor     Objective:    Vitals: BP (!) 146/66   Pulse 78   Ht 5\' 8"  (1.727 m)   Wt 159 lb 9 oz (72.4 kg)   SpO2 98%   BMI 24.26 kg/m   Body mass index is 24.26 kg/m.  Tobacco History  Smoking Status  . Never Smoker  Smokeless Tobacco  . Never Used     Counseling given: Yes   Past Medical History:  Diagnosis Date  . Anemia   . BPH (benign prostatic hypertrophy)   .  Diverticulosis   . Hyperlipidemia   . Hypertension    Past Surgical History:  Procedure Laterality Date  . APPENDECTOMY    . HERNIA REPAIR     Family History  Problem Relation Age of Onset  . Heart disease Other    History  Sexual Activity  . Sexual activity: Not on file    Outpatient Encounter Prescriptions as of 04/17/2017  Medication Sig  . amLODipine (NORVASC) 10 MG tablet TAKE ONE TABLET BY MOUTH IN THE MORNING  . aspirin 81 MG tablet Take 81 mg by mouth daily.   . [DISCONTINUED] amLODipine (NORVASC) 10 MG tablet 1 tablet daily in the morning   No facility-administered encounter medications on file as of 04/17/2017.     Activities of Daily Living In your  present state of health, do you have any difficulty performing the following activities: 04/17/2017 04/22/2016  Hearing? N Y  Vision? N N  Difficulty concentrating or making decisions? N N  Walking or climbing stairs? N N  Dressing or bathing? N N  Doing errands, shopping? N N  Preparing Food and eating ? N -  Using the Toilet? N -  In the past six months, have you accidently leaked urine? N -  Do you have problems with loss of bowel control? N -  Managing your Medications? N -  Managing your Finances? N -  Housekeeping or managing your Housekeeping? N -  Some recent data might be hidden    Patient Care Team: [place], [name], MD as PCP - General (Family Medicine)   Assessment:     Exercise Activities and Dietary recommendations Current Exercise Habits: Home exercise routine, Time (Minutes): 45, Frequency (Times/Week): 3, Weekly Exercise (Minutes/Week): 135, Intensity: Moderate  Goals    . patient          To maintain current level of exercise and engagement      Fall Risk Fall Risk  04/17/2017 04/22/2016 04/26/2015 01/09/2014  Falls in the past year? No No No No   Depression Screen PHQ 2/9 Scores 04/17/2017 04/22/2016 04/26/2015 01/09/2014  PHQ - 2 Score 0 0 0 0    Cognitive Function MMSE - Mini Mental State Exam 04/17/2017  Not completed: (No Data)      no issues with daily living   Immunization History  Administered Date(s) Administered  . Influenza Split 12/15/2011  . Influenza Whole 09/01/2003, 09/19/2010  . Influenza-Unspecified 08/31/2014  . Pneumococcal Conjugate-13 01/09/2014  . Pneumococcal Polysaccharide-23 12/01/2001, 06/13/2008  . Td 12/01/1996, 06/13/2008   Screening Tests Health Maintenance  Topic Date Due  . INFLUENZA VACCINE  07/01/2017  . TETANUS/TDAP  06/13/2018  . 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 Medical and social history . Use of alcohol, tobacco or illicit drugs  . Current medications and supplements . Functional ability and status . Nutritional status . Physical activity . Advanced directives . List of other physicians . Hospitalizations, surgeries, and ER visits in previous 12 months . Vitals . Screenings to  include cognitive, depression, and falls . Referrals and appointments  In addition, I have reviewed and discussed with patient certain preventive protocols, quality metrics, and best practice recommendations. A written personalized care plan for preventive services as well as general preventive health recommendations were provided to patient.     Elida Harbin, RN  04/17/2017   I have reviewed documentation from this visit and I agree with recommendations given.  [name]. [place], MD  [facility]. [place] office.

## 2017-04-17 NOTE — Patient Instructions (Addendum)
Douglas Matthews , Thank you for taking time to come for your Medicare Wellness Visit. I appreciate your ongoing commitment to your health goals. Please review the following plan we discussed and let me know if I can assist you in the future.   Shingrix is a vaccine for the prevention of Shingles in Adults 50 and older.  If you are on Medicare, you can request a prescription from your doctor to be filled at a pharmacy.  Please check with your benefits regarding applicable copays or out of pocket expenses.  The Shingrix is given in 2 vaccines approx 8 weeks apart. You must receive the 2nd dose prior to 6 months from receipt of the first.   Has completed HCPOA and Living Will and is registered downtown; attorney drew this up  You can have a hearing screen where ever you like in the community [facility] - can assist with hearing aid x 1 Vance Peper)  No reviews  [facility]  [address] [place] [address]  [phone]    These are the goals we discussed: Goals    . patient          To maintain current level of exercise and engagement       This is a list of the screening recommended for you and due dates:  Health Maintenance  Topic Date Due  . Flu Shot  07/01/2017  . Tetanus Vaccine  06/13/2018  . Pneumonia vaccines  Completed     Fat and Cholesterol Restricted Diet Getting too much fat and cholesterol in your diet may cause health problems. Following this diet helps keep your fat and cholesterol at normal levels. This can keep you from getting sick. What types of fat should I choose?  Choose monosaturated and polyunsaturated fats. These are found in foods such as olive oil, canola oil, flaxseeds, walnuts, almonds, and seeds.  Eat more omega-3 fats. Good choices include salmon, mackerel, sardines, tuna, flaxseed oil, and ground flaxseeds.  Limit saturated fats. These are in animal products such as meats, butter, and cream. They can also  be in plant products such as palm oil, palm kernel oil, and coconut oil.  Avoid foods with partially hydrogenated oils in them. These contain trans fats. Examples of foods that have trans fats are stick margarine, some tub margarines, cookies, crackers, and other baked goods. What general guidelines do I need to follow?  Check food labels. Look for the words "trans fat" and "saturated fat."  When preparing a meal:  Fill half of your plate with vegetables and green salads.  Fill one fourth of your plate with whole grains. Look for the word "whole" as the first word in the ingredient list.  Fill one fourth of your plate with lean protein foods.  Eat more foods that have fiber, like apples, carrots, beans, peas, and barley.  Eat more home-cooked foods. Eat less at restaurants and buffets.  Limit or avoid alcohol.  Limit foods high in starch and sugar.  Limit fried foods.  Cook foods without frying them. Baking, boiling, grilling, and broiling are all great options.  Lose weight if you are overweight. Losing even a small amount of weight can help your overall health. It can also help prevent diseases such as diabetes and heart disease. What foods can I eat? Grains  Whole grains, such as whole wheat or whole grain breads, crackers, cereals, and pasta. Unsweetened oatmeal, bulgur, barley, quinoa, or  brown rice. Corn or whole wheat flour tortillas. Vegetables  Fresh or frozen vegetables (raw, steamed, roasted, or grilled). Green salads. Fruits  All fresh, canned (in natural juice), or frozen fruits. Meat and Other Protein Products  Ground beef (85% or leaner), grass-fed beef, or beef trimmed of fat. Skinless chicken or [place]. Ground chicken or [place]. Pork trimmed of fat. All fish and seafood. Eggs. Dried beans, peas, or lentils. Unsalted nuts or seeds. Unsalted canned or dry beans. Dairy  Low-fat dairy products, such as skim or 1% milk, 2% or reduced-fat cheeses, low-fat ricotta or  cottage cheese, or plain low-fat yogurt. Fats and Oils  Tub margarines without trans fats. Light or reduced-fat mayonnaise and salad dressings. Avocado. Olive, canola, sesame, or safflower oils. Natural peanut or almond butter (choose ones without added sugar and oil). The items listed above may not be a complete list of recommended foods or beverages. Contact your dietitian for more options.  What foods are not recommended? Grains  White bread. White pasta. White rice. Cornbread. Bagels, pastries, and croissants. Crackers that contain trans fat. Vegetables  White potatoes. Corn. Creamed or fried vegetables. Vegetables in a cheese sauce. Fruits  Dried fruits. Canned fruit in light or heavy syrup. Fruit juice. Meat and Other Protein Products  Fatty cuts of meat. Ribs, chicken wings, bacon, sausage, bologna, salami, chitterlings, fatback, hot dogs, bratwurst, and packaged luncheon meats. Liver and organ meats. Dairy  Whole or 2% milk, cream, half-and-half, and cream cheese. Whole milk cheeses. Whole-fat or sweetened yogurt. Full-fat cheeses. Nondairy creamers and whipped toppings. Processed cheese, cheese spreads, or cheese curds. Sweets and Desserts  Corn syrup, sugars, honey, and molasses. Candy. Jam and jelly. Syrup. Sweetened cereals. Cookies, pies, cakes, donuts, muffins, and ice cream. Fats and Oils  Butter, stick margarine, lard, shortening, ghee, or bacon fat. Coconut, palm kernel, or palm oils. Beverages  Alcohol. Sweetened drinks (such as sodas, lemonade, and fruit drinks or punches). The items listed above may not be a complete list of foods and beverages to avoid. Contact your dietitian for more information.  This information is not intended to replace advice given to you by your health care provider. Make sure you discuss any questions you have with your health care provider. Document Released: 05/18/2012 Document Revised: 07/24/2016 Document Reviewed: 02/16/2014 Elsevier  Interactive Patient Education  2017 [employer]   Health Maintenance, Male A healthy lifestyle and preventive care is important for your health and wellness. Ask your health care provider about what schedule of regular examinations is right for you. What should I know about weight and diet?  Eat a Healthy Diet  Eat plenty of vegetables, fruits, whole grains, low-fat dairy products, and lean protein.  Do not eat a lot of foods high in solid fats, added sugars, or salt. Maintain a Healthy Weight  Regular exercise can help you achieve or maintain a healthy weight. You should:  Do at least 150 minutes of exercise each week. The exercise should increase your heart rate and make you sweat (moderate-intensity exercise).  Do strength-training exercises at least twice a week. Watch Your Levels of Cholesterol and Blood Lipids  Have your blood tested for lipids and cholesterol every 5 years starting at 80 years of age. If you are at high risk for heart disease, you should start having your blood tested when you are 80 years old. You may need to have your cholesterol levels checked more often if:  Your lipid or cholesterol levels are high.  You are  older than 80 years of age.  You are at high risk for heart disease. What should I know about cancer screening? Many types of cancers can be detected early and may often be prevented. Lung Cancer  You should be screened every year for lung cancer if:  You are a current smoker who has smoked for at least 30 years.  You are a former smoker who has quit within the past 15 years.  Talk to your health care provider about your screening options, when you should start screening, and how often you should be screened. Colorectal Cancer  Routine colorectal cancer screening usually begins at 80 years of age and should be repeated every 5-10 years until you are 80 years old. You may need to be screened more often if early forms of precancerous polyps or  small growths are found. Your health care provider may recommend screening at an earlier age if you have risk factors for colon cancer.  Your health care provider may recommend using home test kits to check for hidden blood in the stool.  A small camera at the end of a tube can be used to examine your colon (sigmoidoscopy or colonoscopy). This checks for the earliest forms of colorectal cancer. Prostate and Testicular Cancer  Depending on your age and overall health, your health care provider may do certain tests to screen for prostate and testicular cancer.  Talk to your health care provider about any symptoms or concerns you have about testicular or prostate cancer. Skin Cancer  Check your skin from head to toe regularly.  Tell your health care provider about any new moles or changes in moles, especially if:  There is a change in a mole's size, shape, or color.  You have a mole that is larger than a pencil eraser.  Always use sunscreen. Apply sunscreen liberally and repeat throughout the day.  Protect yourself by wearing long sleeves, pants, a wide-brimmed hat, and sunglasses when outside. What should I know about heart disease, diabetes, and high blood pressure?  If you are 50-71 years of age, have your blood pressure checked every 3-5 years. If you are 57 years of age or older, have your blood pressure checked every year. You should have your blood pressure measured twice-once when you are at a hospital or clinic, and once when you are not at a hospital or clinic. Record the average of the two measurements. To check your blood pressure when you are not at a hospital or clinic, you can use:  An automated blood pressure machine at a pharmacy.  A home blood pressure monitor.  Talk to your health care provider about your target blood pressure.  If you are between 25-74 years old, ask your health care provider if you should take aspirin to prevent heart disease.  Have regular  diabetes screenings by checking your fasting blood sugar level.  If you are at a normal weight and have a low risk for diabetes, have this test once every three years after the age of 76.  If you are overweight and have a high risk for diabetes, consider being tested at a younger age or more often.  A one-time screening for abdominal aortic aneurysm (AAA) by ultrasound is recommended for men aged 65-75 years who are current or former smokers. What should I know about preventing infection? Hepatitis B  If you have a higher risk for hepatitis B, you should be screened for this virus. Talk with your health care provider to  find out if you are at risk for hepatitis B infection. Hepatitis C  Blood testing is recommended for:  Everyone born from [birth year] through 1963.  Anyone with known risk factors for hepatitis C. Sexually Transmitted Diseases (STDs)  You should be screened each year for STDs including gonorrhea and chlamydia if:  You are sexually active and are younger than 80 years of age.  You are older than 80 years of age and your health care provider tells you that you are at risk for this type of infection.  Your sexual activity has changed since you were last screened and you are at an increased risk for chlamydia or gonorrhea. Ask your health care provider if you are at risk.  Talk with your health care provider about whether you are at high risk of being infected with HIV. Your health care provider may recommend a prescription medicine to help prevent HIV infection. What else can I do?  Schedule regular health, dental, and eye exams.  Stay current with your vaccines (immunizations).  Do not use any tobacco products, such as cigarettes, chewing tobacco, and e-cigarettes. If you need help quitting, ask your health care provider.  Limit alcohol intake to no more than 2 drinks per day. One drink equals 12 ounces of beer, 5 ounces of wine, or 1 ounces of hard liquor.  Do not use  street drugs.  Do not share needles.  Ask your health care provider for help if you need support or information about quitting drugs.  Tell your health care provider if you often feel depressed.  Tell your health care provider if you have ever been abused or do not feel safe at home. This information is not intended to replace advice given to you by your health care provider. Make sure you discuss any questions you have with your health care provider. Document Released: 05/15/2008 Document Revised: 07/16/2016 Document Reviewed: 08/21/2015 Elsevier Interactive Patient Education  2017 [employer].    Fall Prevention in the Home Falls can cause injuries and can affect people from all age groups. There are many simple things that you can do to make your home safe and to help prevent falls. What can I do on the outside of my home?  Regularly repair the edges of walkways and driveways and fix any cracks.  Remove high doorway thresholds.  Trim any shrubbery on the main path into your home.  Use bright outdoor lighting.  Clear walkways of debris and clutter, including tools and rocks.  Regularly check that handrails are securely fastened and in good repair. Both sides of any steps should have handrails.  Install guardrails along the edges of any raised decks or porches.  Have leaves, snow, and ice cleared regularly.  Use sand or salt on walkways during winter months.  In the garage, clean up any spills right away, including grease or oil spills. What can I do in the bathroom?  Use night lights.  Install grab bars by the toilet and in the tub and shower. Do not use towel bars as grab bars.  Use non-skid mats or decals on the floor of the tub or shower.  If you need to sit down while you are in the shower, use a plastic, non-slip stool.  Keep the floor dry. Immediately clean up any water that spills on the floor.  Remove soap buildup in the tub or shower on a regular  basis.  Attach bath mats securely with double-sided non-slip rug tape.  Remove throw  rugs and other tripping hazards from the floor. What can I do in the bedroom?  Use night lights.  Make sure that a bedside light is easy to reach.  Do not use oversized bedding that drapes onto the floor.  Have a firm chair that has side arms to use for getting dressed.  Remove throw rugs and other tripping hazards from the floor. What can I do in the kitchen?  Clean up any spills right away.  Avoid walking on wet floors.  Place frequently used items in easy-to-reach places.  If you need to reach for something above you, use a sturdy step stool that has a grab bar.  Keep electrical cables out of the way.  Do not use floor polish or wax that makes floors slippery. If you have to use wax, make sure that it is non-skid floor wax.  Remove throw rugs and other tripping hazards from the floor. What can I do in the stairways?  Do not leave any items on the stairs.  Make sure that there are handrails on both sides of the stairs. Fix handrails that are broken or loose. Make sure that handrails are as long as the stairways.  Check any carpeting to make sure that it is firmly attached to the stairs. Fix any carpet that is loose or worn.  Avoid having throw rugs at the top or bottom of stairways, or secure the rugs with carpet tape to prevent them from moving.  Make sure that you have a light switch at the top of the stairs and the bottom of the stairs. If you do not have them, have them installed. What are some other fall prevention tips?  Wear closed-toe shoes that fit well and support your feet. Wear shoes that have rubber soles or low heels.  When you use a stepladder, make sure that it is completely opened and that the sides are firmly locked. Have someone hold the ladder while you are using it. Do not climb a closed stepladder.  Add color or contrast paint or tape to grab bars and handrails  in your home. Place contrasting color strips on the first and last steps.  Use mobility aids as needed, such as canes, walkers, scooters, and crutches.  Turn on lights if it is dark. Replace any light bulbs that burn out.  Set up furniture so that there are clear paths. Keep the furniture in the same spot.  Fix any uneven floor surfaces.  Choose a carpet design that does not hide the edge of steps of a stairway.  Be aware of any and all pets.  Review your medicines with your healthcare provider. Some medicines can cause dizziness or changes in blood pressure, which increase your risk of falling. Talk with your health care provider about other ways that you can decrease your risk of falls. This may include working with a physical therapist or trainer to improve your strength, balance, and endurance. This information is not intended to replace advice given to you by your health care provider. Make sure you discuss any questions you have with your health care provider. Document Released: 11/07/2002 Document Revised: 04/15/2016 Document Reviewed: 12/22/2014 Elsevier Interactive Patient Education  2017 [employer]

## 2017-04-20 ENCOUNTER — Ambulatory Visit: Payer: Medicare Other | Admitting: Family Medicine

## 2017-04-20 ENCOUNTER — Other Ambulatory Visit: Payer: Self-pay

## 2017-04-20 DIAGNOSIS — R972 Elevated prostate specific antigen [PSA]: Secondary | ICD-10-CM

## 2017-04-20 LAB — PSA: PSA: 4.13 ng/mL — ABNORMAL HIGH (ref 0.10–4.00)

## 2017-04-24 ENCOUNTER — Other Ambulatory Visit: Payer: Self-pay

## 2017-04-24 MED ORDER — ATORVASTATIN CALCIUM 20 MG PO TABS: 20.0000 mg | ORAL_TABLET | Freq: Every day | ORAL | 3 refills | Status: DC

## 2017-06-23 DIAGNOSIS — N400 Enlarged prostate without lower urinary tract symptoms: Secondary | ICD-10-CM | POA: Diagnosis not present

## 2017-08-20 ENCOUNTER — Encounter: Payer: Self-pay | Admitting: Family Medicine

## 2017-09-22 DIAGNOSIS — Z23 Encounter for immunization: Secondary | ICD-10-CM | POA: Diagnosis not present

## 2017-10-04 ENCOUNTER — Other Ambulatory Visit: Payer: Self-pay | Admitting: Family Medicine

## 2017-10-04 DIAGNOSIS — I10 Essential (primary) hypertension: Secondary | ICD-10-CM

## 2017-10-27 DIAGNOSIS — N183 Chronic kidney disease, stage 3 (moderate): Secondary | ICD-10-CM | POA: Diagnosis not present

## 2017-10-27 DIAGNOSIS — E875 Hyperkalemia: Secondary | ICD-10-CM | POA: Diagnosis not present

## 2017-10-27 DIAGNOSIS — E785 Hyperlipidemia, unspecified: Secondary | ICD-10-CM | POA: Diagnosis not present

## 2017-10-27 DIAGNOSIS — I129 Hypertensive chronic kidney disease with stage 1 through stage 4 chronic kidney disease, or unspecified chronic kidney disease: Secondary | ICD-10-CM | POA: Diagnosis not present

## 2017-10-27 DIAGNOSIS — D638 Anemia in other chronic diseases classified elsewhere: Secondary | ICD-10-CM | POA: Diagnosis not present

## 2017-10-27 DIAGNOSIS — N2581 Secondary hyperparathyroidism of renal origin: Secondary | ICD-10-CM | POA: Diagnosis not present

## 2017-10-27 DIAGNOSIS — Z9114 Patient's other noncompliance with medication regimen: Secondary | ICD-10-CM | POA: Diagnosis not present

## 2018-04-18 NOTE — Progress Notes (Addendum)
HPI:   Douglas Matthews is a 83 y.o. male, who is here today for 12 months follow up and Medicare Preventive visit.  He was last seen in 03/2017.   He lives with his wife.. Independent ADL's and IADL's. No falls in the past year and denies depression symptoms.  Mild hearing loss left ear, he does not feel like it is getting worse and not interfering with social activities.    Functional Status Survey: Is the patient deaf or have difficulty hearing?: No Does the patient have difficulty seeing, even when wearing glasses/contacts?: No Does the patient have difficulty concentrating, remembering, or making decisions?: No Does the patient have difficulty walking or climbing stairs?: No Does the patient have difficulty dressing or bathing?: No Does the patient have difficulty doing errands alone such as visiting a doctor's office or shopping?: No  Fall Risk  04/19/2018 04/17/2017 04/22/2016 04/26/2015 01/09/2014  Falls in the past year? _0      Providers he sees regularly:  Eye care provider: Dr Jerline Pain. He is not longer following with urologist (elevated PSA). Last visit on 06/23/17. According to pt,no further follow up was recommended, prn. Nephrologist every 6 months, Dr Hollie Salk.  Depression screen [facility] 2/9 04/19/2018  Decreased Interest 0  Down, Depressed, Hopeless 0  PHQ - 2 Score 0    Mini-Cog - 04/19/18 0834    Normal clock drawing test?  yes    How many words correct?  3         Hearing Screening   _1  _2  _3  _4  _5  _6  _7  _8  _9   Right ear:   Pass Pass Pass  Pass    Left ear:   Fail Pass Pass  Fail      Visual Acuity Screening   Right eye Left eye Both eyes  Without correction: _10  With correction:       Hypertension:   Currently on Amlodipine 10 mg daily.   CKD III, he follows with nephrologist. Last f/u visit 10/2017.  He is taking medications as instructed, no side effects reported.  He has  not noted unusual headache, visual changes, exertional chest pain, dyspnea,  focal weakness, or edema.   Lab Results  Component Value Date   CREATININE 2.4 (A) 08/26/2016   BUN 47 (A) 08/26/2016   NA 136 (A) 08/26/2016   K 5.2 08/26/2016   CL 107 04/22/2016   CO2 27 04/22/2016   Follows with nephrologists q 6 months.   Hyperlipidemia:  Currently on non pharmacologic treatment. He decided not to take Lipitor. Following a low fat diet: Yes.  He did not have side effects with medication.  Lab Results  Component Value Date   CHOL 292 (H) 04/17/2017   HDL 48.70 04/17/2017   LDLCALC 217 (H) 04/17/2017   LDLDIRECT 220.5 12/15/2011   TRIG 130.0 04/17/2017   CHOLHDL 6 04/17/2017      Elevated alk phosphatase:  Lab Results  Component Value Date   ALT 13 04/17/2017   AST 18 04/17/2017   ALKPHOS 150 (H) 04/17/2017   BILITOT 0.4 04/17/2017   GGT in normal range in 08/2016.    Review of Systems  Constitutional: Negative for activity change, appetite change, fatigue and fever.  HENT: Negative for nosebleeds, sore throat and trouble swallowing.   Eyes: Negative for redness and visual disturbance.  Respiratory: Negative for apnea, cough, shortness of breath and wheezing.   Cardiovascular: Negative for chest pain, palpitations  and leg swelling.  Gastrointestinal: Negative for abdominal pain, nausea and vomiting.  Endocrine: Negative for polydipsia, polyphagia and polyuria.  Genitourinary: Negative for decreased urine volume, dysuria and hematuria.  Musculoskeletal: Negative for gait problem and myalgias.  Skin: Negative for rash.  Neurological: Negative for syncope, weakness and headaches.  Psychiatric/Behavioral: Negative for confusion. The patient is not nervous/anxious.      Current Outpatient Medications on File Prior to Visit  Medication Sig Dispense Refill  . amLODipine (NORVASC) 10 MG tablet TAKE 1 TABLET BY MOUTH IN THE MORNING 90 tablet 1  . aspirin 81 MG tablet  Take 81 mg by mouth daily.     Marland Kitchen atorvastatin (LIPITOR) 20 MG tablet Take 1 tablet (20 mg total) by mouth daily. (Patient not taking: Reported on 04/19/2018) 30 tablet 3   No current facility-administered medications on file prior to visit.      Past Medical History:  Diagnosis Date  . Anemia   . BPH (benign prostatic hypertrophy)   . Diverticulosis   . Hyperlipidemia   . Hypertension    No Known Allergies  Social History   Socioeconomic History  . Marital status: Married    Spouse name: Not on file  . Number of children: Not on file  . Years of education: Not on file  . Highest education level: Not on file  Occupational History  . Not on file  Social Needs  . Financial resource strain: Not on file  . Food insecurity:    Worry: Not on file    Inability: Not on file  . Transportation needs:    Medical: Not on file    Non-medical: Not on file  Tobacco Use  . Smoking status: Never Smoker  . Smokeless tobacco: Never Used  Substance and Sexual Activity  . Alcohol use: No  . Drug use: No  . Sexual activity: Not on file  Lifestyle  . Physical activity:    Days per week: Not on file    Minutes per session: Not on file  . Stress: Not on file  Relationships  . Social connections:    Talks on phone: Not on file    Gets together: Not on file    Attends religious service: Not on file    Active member of club or organization: Not on file    Attends meetings of clubs or organizations: Not on file    Relationship status: Not on file  Other Topics Concern  . Not on file  Social History Narrative   Married x 50 years     Vitals:   04/19/18 0810  BP: 126/84  Pulse: 64  Resp: 16  Temp: 97.7 F (36.5 C)  SpO2: 99%   Body mass index is 23.64 kg/m.   Physical Exam  Nursing note and vitals reviewed. Constitutional: He is oriented to person, place, and time. He appears well-developed. No distress.  HENT:  Head: Normocephalic and atraumatic.  Mouth/Throat:  Oropharynx is clear and moist and mucous membranes are normal. He has dentures.  Eyes: Pupils are equal, round, and reactive to light. Conjunctivae are normal.  Arcus senile,bilateral.  Cardiovascular: Normal rate and regular rhythm.  Murmur (soft SEM in apex.) heard. Pulses:      Dorsalis pedis pulses are 2+ on the right side, and 2+ on the left side.  Respiratory: Effort normal and breath sounds normal. No respiratory distress.  GI: Soft. He exhibits no mass. There is no hepatomegaly. There is no tenderness.  Musculoskeletal: He exhibits  no edema.  Lymphadenopathy:    He has no cervical adenopathy.  Neurological: He is alert and oriented to person, place, and time. He has normal strength. No cranial nerve deficit. Gait normal.  Skin: Skin is warm. No rash noted. No erythema.  Psychiatric: He has a normal mood and affect. Cognition and memory are normal.  Well groomed, good eye contact.    ASSESSMENT AND PLAN:   Douglas Matthews was seen today for 12 months follow-up and Medicare visit.  Orders Placed This Encounter  Procedures  . Comprehensive metabolic panel  . Lipid panel    Lab Results  Component Value Date   CHOL 242 (H) 04/19/2018   HDL 44.80 04/19/2018   LDLCALC 183 (H) 04/19/2018   LDLDIRECT 220.5 12/15/2011   TRIG 70.0 04/19/2018   CHOLHDL 5 04/19/2018   Lab Results  Component Value Date   ALT 12 04/19/2018   AST 16 04/19/2018   ALKPHOS 152 (H) 04/19/2018   BILITOT 0.4 04/19/2018   Lab Results  Component Value Date   CREATININE 1.93 (H) 04/19/2018   BUN 41 (H) 04/19/2018   NA 141 04/19/2018   K 4.6 04/19/2018   CL 108 04/19/2018   CO2 24 04/19/2018    Medicare annual wellness visit, subsequent  We discussed the importance of staying active, physically and mentally, as well as the benefits of a healthy/balance diet. Low impact exercise that involve stretching and strengthing are ideal. Vaccines up to date. We discussed preventive screening for  the next 5-10 years, summery of recommendations given in AVS:  Periodic eye exam/glaucoma screening. Fall prevention. Prostate cancer guideline discussed,no urinary symptoms. He agrees with not longer checking PSA.  Advance directives and end of life discussed, he does not have POA or living will.Information given,recommended.    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 No changes in current management,Amlodipine 10 mg daily. Low salt diet recommended. Eye exam recommended annually. F/U in 12 months, before if needed. He is following with nephrologist q 6 months.   CKD (chronic kidney disease), stage III ([place]) Continue following with nephrologist. Last visit in 08/2017, he has not received letter but thinks he has an appt coming.   Hyperlipidemia He has not been compliant with medication. For now continue non pharmacologic treatment. We discussed adverse effects of HLD. Further recommendations will be given according to FLP results.     -Mr. BARTON WANT was advised to return sooner than planned today if new concerns arise.       Maren Wiesen G. Martinique, MD  [facility]. [place] office.

## 2018-04-19 ENCOUNTER — Encounter: Payer: Self-pay | Admitting: Family Medicine

## 2018-04-19 ENCOUNTER — Ambulatory Visit (INDEPENDENT_AMBULATORY_CARE_PROVIDER_SITE_OTHER): Payer: Medicare Other | Admitting: Family Medicine

## 2018-04-19 VITALS — BP 126/84 | HR 64 | Temp 97.7°F | Resp 16 | Ht 68.0 in | Wt 155.5 lb

## 2018-04-19 DIAGNOSIS — Z0000 Encounter for general adult medical examination without abnormal findings: Secondary | ICD-10-CM | POA: Diagnosis not present

## 2018-04-19 DIAGNOSIS — E785 Hyperlipidemia, unspecified: Secondary | ICD-10-CM

## 2018-04-19 DIAGNOSIS — N183 Chronic kidney disease, stage 3 unspecified: Secondary | ICD-10-CM

## 2018-04-19 DIAGNOSIS — I129 Hypertensive chronic kidney disease with stage 1 through stage 4 chronic kidney disease, or unspecified chronic kidney disease: Secondary | ICD-10-CM

## 2018-04-19 LAB — COMPREHENSIVE METABOLIC PANEL
ALK PHOS: 152 U/L — AB (ref 39–117)
ALT: 12 U/L (ref 0–53)
AST: 16 U/L (ref 0–37)
Albumin: 4.1 g/dL (ref 3.5–5.2)
BUN: 41 mg/dL — AB (ref 6–23)
CHLORIDE: 108 meq/L (ref 96–112)
CO2: 24 meq/L (ref 19–32)
Calcium: 9.2 mg/dL (ref 8.4–10.5)
Creatinine, Ser: 1.93 mg/dL — ABNORMAL HIGH (ref 0.40–1.50)
GFR: 43.24 mL/min — AB (ref >60.00)
GLUCOSE: 88 mg/dL (ref 70–99)
Potassium: 4.6 mEq/L (ref 3.5–5.1)
SODIUM: 141 meq/L (ref 135–145)
Total Bilirubin: 0.4 mg/dL (ref 0.2–1.2)
Total Protein: 7.3 g/dL (ref 6.0–8.3)

## 2018-04-19 LAB — LIPID PANEL
CHOL/HDL RATIO: 5
Cholesterol: 242 mg/dL — ABNORMAL HIGH (ref 0–200)
HDL: 44.8 mg/dL (ref >39.00)
LDL CALC: 183 mg/dL — AB (ref 0–99)
NonHDL: 197.39
Triglycerides: 70 mg/dL (ref 0.0–149.0)
VLDL: 14 mg/dL (ref 0.0–40.0)

## 2018-04-19 NOTE — Assessment & Plan Note (Signed)
Adequately controlled. No changes in current management,Amlodipine 10 mg daily. Low salt diet recommended. Eye exam recommended annually. F/U in 12 months, before if needed. He is following with nephrologist q 6 months.

## 2018-04-19 NOTE — Assessment & Plan Note (Signed)
He has not been compliant with medication. For now continue non pharmacologic treatment. We discussed adverse effects of HLD. Further recommendations will be given according to FLP results.

## 2018-04-19 NOTE — Assessment & Plan Note (Signed)
Continue following with nephrologist. Last visit in 08/2017, he has not received letter but thinks he has an appt coming.

## 2018-04-19 NOTE — Patient Instructions (Signed)
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 Please remove small rugs and furniture that is "in your way" and could increase the risk of falls. Stretching exercises may help with fall prevention: Yoga and Tai Chi are some examples. Low impact exercise is better, so you are not very achy the next day.  -Sun screen and avoidance of direct sun light recommended. Caution with dehydration, if working outdoors be sure to drink enough fluids.  - Some medications are not safe as we age, increases the risk of side effects and can potentially interact with other medication you are also taken;  including some of over the counter medications. Be sure to let me know when you start a new medication even if it is a dietary/vitamin supplement.   -Healthy diet low in red meet/animal fat and sugar + regular physical activity is recommended.       Screening schedule for the next 5-10 years:  Glaucoma screening/eye exam every 1-2 years.  Flu vaccine annually.  Fall prevention   Abdominal aortic aneurysm screening if you ever smoke.    Advance directives:  Please see a lawyer and/or go to this website to help you with advanced directives and designating a health care power of attorney so that your wishes will be followed should you become too ill to make your own medical decisions.  [email or web address]  Please be sure medication list is accurate. If a new problem present, please set up appointment sooner than planned today.

## 2018-04-26 ENCOUNTER — Encounter: Payer: Self-pay | Admitting: Family Medicine

## 2018-05-03 ENCOUNTER — Encounter: Payer: Self-pay | Admitting: *Deleted

## 2018-07-08 DIAGNOSIS — D638 Anemia in other chronic diseases classified elsewhere: Secondary | ICD-10-CM | POA: Diagnosis not present

## 2018-07-08 DIAGNOSIS — E875 Hyperkalemia: Secondary | ICD-10-CM | POA: Diagnosis not present

## 2018-07-08 DIAGNOSIS — I129 Hypertensive chronic kidney disease with stage 1 through stage 4 chronic kidney disease, or unspecified chronic kidney disease: Secondary | ICD-10-CM | POA: Diagnosis not present

## 2018-07-08 DIAGNOSIS — N2581 Secondary hyperparathyroidism of renal origin: Secondary | ICD-10-CM | POA: Diagnosis not present

## 2018-07-08 DIAGNOSIS — N183 Chronic kidney disease, stage 3 (moderate): Secondary | ICD-10-CM | POA: Diagnosis not present

## 2018-07-08 DIAGNOSIS — Z9114 Patient's other noncompliance with medication regimen: Secondary | ICD-10-CM | POA: Diagnosis not present

## 2018-09-28 ENCOUNTER — Encounter: Payer: Self-pay | Admitting: Gastroenterology

## 2018-11-01 ENCOUNTER — Other Ambulatory Visit: Payer: Self-pay | Admitting: Family Medicine

## 2018-11-01 DIAGNOSIS — I10 Essential (primary) hypertension: Secondary | ICD-10-CM

## 2018-11-02 ENCOUNTER — Encounter: Payer: Self-pay | Admitting: Family Medicine

## 2019-01-12 DIAGNOSIS — D638 Anemia in other chronic diseases classified elsewhere: Secondary | ICD-10-CM | POA: Diagnosis not present

## 2019-01-12 DIAGNOSIS — I129 Hypertensive chronic kidney disease with stage 1 through stage 4 chronic kidney disease, or unspecified chronic kidney disease: Secondary | ICD-10-CM | POA: Diagnosis not present

## 2019-01-12 DIAGNOSIS — E785 Hyperlipidemia, unspecified: Secondary | ICD-10-CM | POA: Diagnosis not present

## 2019-01-12 DIAGNOSIS — N183 Chronic kidney disease, stage 3 (moderate): Secondary | ICD-10-CM | POA: Diagnosis not present

## 2019-01-12 DIAGNOSIS — N2581 Secondary hyperparathyroidism of renal origin: Secondary | ICD-10-CM | POA: Diagnosis not present

## 2019-01-12 DIAGNOSIS — Z9114 Patient's other noncompliance with medication regimen: Secondary | ICD-10-CM | POA: Diagnosis not present

## 2019-04-18 ENCOUNTER — Encounter: Payer: Self-pay | Admitting: Family Medicine

## 2019-04-26 ENCOUNTER — Ambulatory Visit: Payer: Medicare Other | Admitting: Family Medicine

## 2019-04-29 ENCOUNTER — Other Ambulatory Visit: Payer: Self-pay | Admitting: *Deleted

## 2019-04-29 ENCOUNTER — Encounter: Payer: Self-pay | Admitting: *Deleted

## 2019-04-29 ENCOUNTER — Encounter: Payer: Self-pay | Admitting: Family Medicine

## 2019-04-29 ENCOUNTER — Ambulatory Visit (INDEPENDENT_AMBULATORY_CARE_PROVIDER_SITE_OTHER): Payer: Medicare Other | Admitting: Family Medicine

## 2019-04-29 ENCOUNTER — Other Ambulatory Visit: Payer: Self-pay

## 2019-04-29 VITALS — BP 130/76 | HR 96 | Temp 98.2°F | Resp 12 | Ht 68.0 in | Wt 154.0 lb

## 2019-04-29 DIAGNOSIS — N401 Enlarged prostate with lower urinary tract symptoms: Secondary | ICD-10-CM

## 2019-04-29 DIAGNOSIS — I10 Essential (primary) hypertension: Secondary | ICD-10-CM

## 2019-04-29 DIAGNOSIS — Z0000 Encounter for general adult medical examination without abnormal findings: Secondary | ICD-10-CM

## 2019-04-29 DIAGNOSIS — E785 Hyperlipidemia, unspecified: Secondary | ICD-10-CM

## 2019-04-29 DIAGNOSIS — N183 Chronic kidney disease, stage 3 unspecified: Secondary | ICD-10-CM

## 2019-04-29 DIAGNOSIS — R351 Nocturia: Secondary | ICD-10-CM

## 2019-04-29 LAB — LIPID PANEL
Cholesterol: 307 mg/dL — ABNORMAL HIGH (ref 0–200)
HDL: 57.8 mg/dL (ref >39.00)
LDL Cholesterol: 228 mg/dL — ABNORMAL HIGH (ref 0–99)
NonHDL: 249
Total CHOL/HDL Ratio: 5
Triglycerides: 103 mg/dL (ref 0.0–149.0)
VLDL: 20.6 mg/dL (ref 0.0–40.0)

## 2019-04-29 LAB — MICROALBUMIN / CREATININE URINE RATIO
Creatinine,U: 78.9 mg/dL
Microalb Creat Ratio: 7.2 mg/g (ref 0.0–30.0)
Microalb, Ur: 5.7 mg/dL — ABNORMAL HIGH (ref 0.0–1.9)

## 2019-04-29 LAB — BASIC METABOLIC PANEL
BUN: 44 mg/dL — ABNORMAL HIGH (ref 6–23)
CO2: 26 mEq/L (ref 19–32)
Calcium: 9.6 mg/dL (ref 8.4–10.5)
Chloride: 106 mEq/L (ref 96–112)
Creatinine, Ser: 2.12 mg/dL — ABNORMAL HIGH (ref 0.40–1.50)
GFR: 36.41 mL/min — ABNORMAL LOW (ref >60.00)
Glucose, Bld: 85 mg/dL (ref 70–99)
Potassium: 5.1 mEq/L (ref 3.5–5.1)
Sodium: 141 mEq/L (ref 135–145)

## 2019-04-29 LAB — VITAMIN D 25 HYDROXY (VIT D DEFICIENCY, FRACTURES): VITD: 41.6 ng/mL (ref 30.00–100.00)

## 2019-04-29 MED ORDER — AMLODIPINE BESYLATE 10 MG PO TABS: 10.0000 mg | ORAL_TABLET | Freq: Every morning | ORAL | 3 refills | Status: DC

## 2019-04-29 NOTE — Assessment & Plan Note (Signed)
BP adequately controlled. No changes in amlodipine 10 mg daily. We discussed some side effects of medication. Continue monitoring BP at home. Continue low-salt diet and annual eye exam. He would like to continue following annually.

## 2019-04-29 NOTE — Assessment & Plan Note (Signed)
We discussed some benefits of statin medications. He is not interested in pharmacologic treatment for now. Continue low-fat diet. Further recommendation will be given according to lipid panel results.

## 2019-04-29 NOTE — Progress Notes (Addendum)
HPI:   Douglas Matthews is a 84 y.o. male, who is here today for Medicare Preventive visit and follow up.   He was last seen on 04/19/2018.  He has not had new problems since his last visit. He follow-up with urologist because of elevated PSA and according to patient, he does not need further work-up and he can follow-up as needed. He denies changes in urinary frequency, dysuria, gross hematuria, or nocturia.  Hyperlipidemia: He does continue atorvastatin 20 mg, no side effects. He is following low-fat diet.  Lab Results  Component Value Date   CHOL 242 (H) 04/19/2018   HDL 44.80 04/19/2018   LDLCALC 183 (H) 04/19/2018   LDLDIRECT 220.5 12/15/2011   TRIG 70.0 04/19/2018   CHOLHDL 5 04/19/2018   Hypertension: Currently he is on amlodipine 10 mg daily. He checks BP regularly, 120s-130s/70s. He is tolerating medication well. He has not noted unusual headache, visual changes, chest pain, dyspnea, palpitations, abdominal pain, nausea, vomiting, focal neurologic deficit, or edema.  Last eye exam in 12/2018.  Lab Results  Component Value Date   CREATININE 1.93 (H) 04/19/2018   BUN 41 (H) 04/19/2018   NA 141 04/19/2018   K 4.6 04/19/2018   CL 108 04/19/2018   CO2 24 04/19/2018     He lives with his wife. Independent ADL's and IADL's. No falls in the past year and denies depression symptoms.  Functional Status Survey: Is the patient deaf or have difficulty hearing?: Yes(Mild,stable) Does the patient have difficulty seeing, even when wearing glasses/contacts?: No Does the patient have difficulty concentrating, remembering, or making decisions?: No Does the patient have difficulty walking or climbing stairs?: No Does the patient have difficulty dressing or bathing?: No Does the patient have difficulty doing errands alone such as visiting a doctor's office or shopping?: No  Fall Risk  04/29/2019 04/19/2018 04/17/2017 04/22/2016 04/26/2015  Falls in the past year? 0 No  No No No  Number falls in past yr: 0 - - - -  Injury with Fall? 0 - - - -  Risk for fall due to : Impaired balance/gait - - - -  Follow up Education provided - - - -     Providers he sees regularly:  Eye care provider: Dr. Jerline Pain, annually.  Depression screen [facility] 2/9 04/29/2019  Decreased Interest 0  Down, Depressed, Hopeless 0  PHQ - 2 Score 0    Mini-Cog - 04/29/19 1302    Normal clock drawing test?  yes    How many words correct?  3         Visual Acuity Screening   Right eye Left eye Both eyes  Without correction:     With correction: '20/40 20/25 20/25 '$     Review of Systems  Constitutional: Negative for activity change, appetite change, fatigue and fever.  HENT: Negative for nosebleeds, sore throat and trouble swallowing.   Eyes: Negative for redness and visual disturbance.  Respiratory: Negative for cough, shortness of breath and wheezing.   Cardiovascular: Negative for chest pain, palpitations and leg swelling.  Gastrointestinal: Negative for abdominal pain, nausea and vomiting.  Endocrine: Negative for polydipsia, polyphagia and polyuria.  Genitourinary: Negative for decreased urine volume and hematuria.  Skin: Negative for rash.  Neurological: Negative for dizziness, weakness and headaches.     Current Outpatient Medications on File Prior to Visit  Medication Sig Dispense Refill  . aspirin 81 MG tablet Take 81 mg by mouth daily.  No current facility-administered medications on file prior to visit.      Past Medical History:  Diagnosis Date  . Anemia   . BPH (benign prostatic hypertrophy)   . Diverticulosis   . Hyperlipidemia   . Hypertension    No Known Allergies  Social History   Socioeconomic History  . Marital status: Married    Spouse name: Not on file  . Number of children: Not on file  . Years of education: Not on file  . Highest education level: Not on file  Occupational History  . Not on file  Social Needs  . Financial  resource strain: Not on file  . Food insecurity:    Worry: Not on file    Inability: Not on file  . Transportation needs:    Medical: Not on file    Non-medical: Not on file  Tobacco Use  . Smoking status: Never Smoker  . Smokeless tobacco: Never Used  Substance and Sexual Activity  . Alcohol use: No  . Drug use: No  . Sexual activity: Not on file  Lifestyle  . Physical activity:    Days per week: Not on file    Minutes per session: Not on file  . Stress: Not on file  Relationships  . Social connections:    Talks on phone: Not on file    Gets together: Not on file    Attends religious service: Not on file    Active member of club or organization: Not on file    Attends meetings of clubs or organizations: Not on file    Relationship status: Not on file  Other Topics Concern  . Not on file  Social History Narrative   Married x 50 years     Vitals:   04/29/19 1206  BP: 130/76  Pulse: 96  Resp: 12  Temp: 98.2 F (36.8 C)  SpO2: 98%   Body mass index is 23.42 kg/m.  Physical Exam  Nursing note reviewed. Constitutional: He is oriented to person, place, and time. He appears well-developed and well-nourished. No distress.  HENT:  Head: Normocephalic and atraumatic.  Mouth/Throat: Oropharynx is clear and moist and mucous membranes are normal.  Eyes: Pupils are equal, round, and reactive to light. Conjunctivae and EOM are normal.  Cardiovascular: Normal rate and regular rhythm.  No murmur heard. DP pulses present,bilateral.  Respiratory: Effort normal and breath sounds normal. No respiratory distress.  GI: Soft. He exhibits no mass. There is no hepatomegaly. There is no abdominal tenderness.  Musculoskeletal:        General: No edema.  Lymphadenopathy:    He has no cervical adenopathy.  Neurological: He is alert and oriented to person, place, and time. He has normal strength. No cranial nerve deficit.  Mildly unstable gait,it is not assisted.  Skin: Skin is warm.  No rash noted. No erythema.  Psychiatric: He has a normal mood and affect. Cognition and memory are normal.  Well groomed, good eye contact.     ASSESSMENT AND PLAN:   Douglas Matthews was seen today for 12 months follow-up and Medicare preventive visit.  Orders Placed This Encounter  Procedures  . Lipid panel  . Basic metabolic panel  . VITAMIN D 25 Hydroxy (Vit-D Deficiency, Fractures)  . Microalbumin / creatinine urine ratio   Lab Results  Component Value Date   MICROALBUR 5.7 (H) 04/29/2019   Lab Results  Component Value Date   CREATININE 2.12 (H) 04/29/2019   BUN 44 (H)  04/29/2019   NA 141 04/29/2019   K 5.1 04/29/2019   CL 106 04/29/2019   CO2 26 04/29/2019   Lab Results  Component Value Date   CHOL 307 (H) 04/29/2019   HDL 57.80 04/29/2019   LDLCALC 228 (H) 04/29/2019   LDLDIRECT 220.5 12/15/2011   TRIG 103.0 04/29/2019   CHOLHDL 5 04/29/2019    Medicare annual wellness visit, subsequent We discussed the importance of staying active, physically and mentally, as well as the benefits of a healthy/balance diet. Low impact exercise that involve stretching and strengthing are ideal. Vaccines up to date. We discussed preventive screening for the next 5-10 years, summery of recommendations discussed. No further screening due to age.  Immunization History  Administered Date(s) Administered  . Influenza Split 12/15/2011  . Influenza Whole 09/01/2003, 09/19/2010  . Influenza, High Dose Seasonal PF 09/22/2017  . Influenza-Unspecified 08/31/2014  . Pneumococcal Conjugate-13 01/09/2014  . Pneumococcal Polysaccharide-23 12/01/2001, 06/13/2008  . Td 12/01/1996, 06/13/2008  . Zoster Recombinat (Shingrix) 04/13/2018, 09/01/2018   Colonoscopy done in 08/2008. Prostate cancer screening, PSA elevated in the past (03/2017), follows with urology as needed.  No further work-up was recommended. Annual flu vaccine. Current recommendations for Aspirin and side effects,he  can discontinue. Periodic eye exam and annual glaucoma screening. Diabetes screening annual. Fall prevention.  Advance directives and end of life discussed, he has POA and living will, I asked to bring a copy next visit.   Essential hypertension BP adequately controlled. No changes in amlodipine 10 mg daily. We discussed some side effects of medication. Continue monitoring BP at home. Continue low-salt diet and annual eye exam. He would like to continue following annually.  CKD (chronic kidney disease), stage III ([facility]) Creatinine and eGFR have been stable. Continue adequate hydration and low-salt diet. Avoid NSAIDs. Adequate BP control.  BPH associated with nocturia Problem is stable. He has no interested in pharmacologic treatment. Instructed about warning signs.  Hyperlipidemia We discussed some benefits of statin medications. He is not interested in pharmacologic treatment for now. Continue low-fat diet. Further recommendation will be given according to lipid panel results.    Return in about 1 year (around 04/28/2020) for Medicare and F/U.    -Mr. JOELLE FLESSNER was advised to return sooner than planned today if new concerns arise.       Elias Bordner G. Martinique, MD  [facility]. [place] office.

## 2019-04-29 NOTE — Assessment & Plan Note (Signed)
Problem is stable. He has no interested in pharmacologic treatment. Instructed about warning signs.

## 2019-04-29 NOTE — Patient Instructions (Addendum)
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 Please remove small rugs and furniture that is "in your way" and could increase the risk of falls. Stretching exercises may help with fall prevention: Yoga and Tai Chi are some examples. Low impact exercise is better, so you are not very achy the next day.  -Sun screen and avoidance of direct sun light recommended. Caution with dehydration, if working outdoors be sure to drink enough fluids.  - Some medications are not safe as we age, increases the risk of side effects and can potentially interact with other medication you are also taken;  including some of over the counter medications. Be sure to let me know when you start a new medication even if it is a dietary/vitamin supplement.   -Healthy diet low in red meet/animal fat and sugar + regular physical activity is recommended.   Annual flu vaccine.    Please be sure medication list is accurate. If a new problem present, please set up appointment sooner than planned today.

## 2019-04-29 NOTE — Assessment & Plan Note (Addendum)
Creatinine and eGFR have been stable. Continue adequate hydration and low-salt diet. Avoid NSAIDs. Adequate BP control. 

## 2019-05-04 ENCOUNTER — Encounter: Payer: Self-pay | Admitting: *Deleted

## 2019-08-26 DIAGNOSIS — N2581 Secondary hyperparathyroidism of renal origin: Secondary | ICD-10-CM | POA: Diagnosis not present

## 2019-08-26 DIAGNOSIS — E785 Hyperlipidemia, unspecified: Secondary | ICD-10-CM | POA: Diagnosis not present

## 2019-08-26 DIAGNOSIS — N183 Chronic kidney disease, stage 3 (moderate): Secondary | ICD-10-CM | POA: Diagnosis not present

## 2019-08-26 DIAGNOSIS — D638 Anemia in other chronic diseases classified elsewhere: Secondary | ICD-10-CM | POA: Diagnosis not present

## 2019-08-26 DIAGNOSIS — I129 Hypertensive chronic kidney disease with stage 1 through stage 4 chronic kidney disease, or unspecified chronic kidney disease: Secondary | ICD-10-CM | POA: Diagnosis not present

## 2019-08-26 DIAGNOSIS — Z9114 Patient's other noncompliance with medication regimen: Secondary | ICD-10-CM | POA: Diagnosis not present

## 2019-09-26 DIAGNOSIS — E875 Hyperkalemia: Secondary | ICD-10-CM | POA: Diagnosis not present

## 2020-01-03 ENCOUNTER — Encounter: Payer: Self-pay | Admitting: Family Medicine

## 2020-01-04 DIAGNOSIS — H2513 Age-related nuclear cataract, bilateral: Secondary | ICD-10-CM | POA: Diagnosis not present

## 2020-01-04 DIAGNOSIS — H40013 Open angle with borderline findings, low risk, bilateral: Secondary | ICD-10-CM | POA: Diagnosis not present

## 2020-01-04 DIAGNOSIS — H35033 Hypertensive retinopathy, bilateral: Secondary | ICD-10-CM | POA: Diagnosis not present

## 2020-01-04 DIAGNOSIS — H18511 Endothelial corneal dystrophy, right eye: Secondary | ICD-10-CM | POA: Diagnosis not present

## 2020-01-06 ENCOUNTER — Telehealth: Payer: Self-pay

## 2020-01-06 NOTE — Telephone Encounter (Signed)
Pt is wanting to schedule his physical - not due until end of May/beginning of June.

## 2020-01-06 NOTE — Telephone Encounter (Signed)
Brooke scheduled this patient for 04/16/2020 at 8 AM

## 2020-02-05 ENCOUNTER — Encounter: Payer: Self-pay | Admitting: Nephrology

## 2020-02-06 DIAGNOSIS — E875 Hyperkalemia: Secondary | ICD-10-CM | POA: Diagnosis not present

## 2020-02-06 DIAGNOSIS — I129 Hypertensive chronic kidney disease with stage 1 through stage 4 chronic kidney disease, or unspecified chronic kidney disease: Secondary | ICD-10-CM | POA: Diagnosis not present

## 2020-02-06 DIAGNOSIS — D638 Anemia in other chronic diseases classified elsewhere: Secondary | ICD-10-CM | POA: Diagnosis not present

## 2020-02-06 DIAGNOSIS — Z9114 Patient's other noncompliance with medication regimen: Secondary | ICD-10-CM | POA: Diagnosis not present

## 2020-02-06 DIAGNOSIS — N1832 Chronic kidney disease, stage 3b: Secondary | ICD-10-CM | POA: Diagnosis not present

## 2020-02-06 DIAGNOSIS — N2581 Secondary hyperparathyroidism of renal origin: Secondary | ICD-10-CM | POA: Diagnosis not present

## 2020-02-06 DIAGNOSIS — E785 Hyperlipidemia, unspecified: Secondary | ICD-10-CM | POA: Diagnosis not present

## 2020-02-23 DIAGNOSIS — N1832 Chronic kidney disease, stage 3b: Secondary | ICD-10-CM | POA: Diagnosis not present

## 2020-03-26 DIAGNOSIS — N1832 Chronic kidney disease, stage 3b: Secondary | ICD-10-CM | POA: Diagnosis not present

## 2020-04-16 ENCOUNTER — Encounter: Payer: Self-pay | Admitting: Family Medicine

## 2020-04-16 ENCOUNTER — Other Ambulatory Visit: Payer: Self-pay

## 2020-04-16 ENCOUNTER — Ambulatory Visit (INDEPENDENT_AMBULATORY_CARE_PROVIDER_SITE_OTHER): Payer: Medicare Other | Admitting: Family Medicine

## 2020-04-16 VITALS — BP 160/80 | HR 95 | Temp 97.6°F | Resp 12 | Ht 68.0 in | Wt 154.0 lb

## 2020-04-16 DIAGNOSIS — I129 Hypertensive chronic kidney disease with stage 1 through stage 4 chronic kidney disease, or unspecified chronic kidney disease: Secondary | ICD-10-CM

## 2020-04-16 DIAGNOSIS — E785 Hyperlipidemia, unspecified: Secondary | ICD-10-CM | POA: Diagnosis not present

## 2020-04-16 DIAGNOSIS — I10 Essential (primary) hypertension: Secondary | ICD-10-CM

## 2020-04-16 DIAGNOSIS — N1831 Chronic kidney disease, stage 3a: Secondary | ICD-10-CM

## 2020-04-16 LAB — LIPID PANEL
Cholesterol: 310 mg/dL — ABNORMAL HIGH (ref 0–200)
HDL: 51.4 mg/dL (ref >39.00)
LDL Cholesterol: 236 mg/dL — ABNORMAL HIGH (ref 0–99)
NonHDL: 259.09
Total CHOL/HDL Ratio: 6
Triglycerides: 117 mg/dL (ref 0.0–149.0)
VLDL: 23.4 mg/dL (ref 0.0–40.0)

## 2020-04-16 NOTE — Assessment & Plan Note (Signed)
BP is elevated today. He is reporting adequate BP's at home. We discussed possible complications of elevated BP. No changes in current management. Instructed about warning signs. Instructed to bring BP monitor next visit.

## 2020-04-16 NOTE — Assessment & Plan Note (Addendum)
Stable. Continue low salt diet and avoidance of nephrotoxic agents. Adequate hydration and adequate BP controlled. Following with nephrologist.

## 2020-04-16 NOTE — Assessment & Plan Note (Addendum)
He has refused statins. Continue non pharmacologic treatment. Further recommendations will be given according to lab results.

## 2020-04-16 NOTE — Patient Instructions (Addendum)
Health Maintenance Due  Topic Date Due  . COVID-19 Vaccine (2 - Pfizer 2-dose series) 01/13/2020    Depression screen [facility] 2/9 04/16/2020 04/29/2019 04/19/2018  Decreased Interest 0 0 0  Down, Depressed, Hopeless 0 0 0  PHQ - 2 Score 0 0 0   A few things to remember from today's visit:  Blood pressure 160/80. No changes today. Please bring your blood pressure monitor next visit.   If you need refills please call your pharmacy. Do not use My Chart to request refills or for acute issues that need immediate attention.   Please be sure medication list is accurate. If a new problem present, please set up appointment sooner than planned today.

## 2020-04-16 NOTE — Progress Notes (Signed)
HPI: DouglasDouglas Matthews is a 85 y.o. male, who is here today for chronic disease management.  He was last seen on 04/29/19. He was schedule for AWV but his last one was 04/29/19.  HLD:He is on non pharmacologic treatment. Follows low fat diet. Has not been consistent with regular physical activity, planning on going back to the gym.  Lab Results  Component Value Date   CHOL 307 (H) 04/29/2019   HDL 57.80 04/29/2019   LDLCALC 228 (H) 04/29/2019   LDLDIRECT 220.5 12/15/2011   TRIG 103.0 04/29/2019   CHOLHDL 5 04/29/2019   HTN:BP elevated today. CKD III. He has not noted gross hematuria,decreased urine output,or foam in urine. He follows with nephrologist.  Reporting home BP's 130/80's. He is on Amlodipine 10 mg daily.  Denies severe/frequent headache, visual changes, chest pain, dyspnea, palpitation, claudication, focal weakness, or edema.  Review of Systems  Constitutional: Negative for activity change, appetite change, fatigue and fever.  HENT: Negative for mouth sores, nosebleeds and sore throat.   Respiratory: Negative for cough and wheezing.   Gastrointestinal: Negative for abdominal pain, nausea and vomiting.  Genitourinary: Negative for dysuria.  Musculoskeletal: Negative for gait problem and myalgias.  Skin: Negative for rash and wound.  Neurological: Negative for dizziness, syncope, facial asymmetry and weakness.  Psychiatric/Behavioral: Negative for confusion.  Rest of ROS, see pertinent positives sand negatives in HPI  Current Outpatient Medications on File Prior to Visit  Medication Sig Dispense Refill  . amLODipine (NORVASC) 10 MG tablet Take 1 tablet (10 mg total) by mouth every morning. 90 tablet 3  . aspirin 81 MG tablet Take 81 mg by mouth daily.      No current facility-administered medications on file prior to visit.   Past Medical History:  Diagnosis Date  . Anemia   . BPH (benign prostatic hypertrophy)   . Diverticulosis   .  Hyperlipidemia   . Hypertension    No Known Allergies  Social History   Socioeconomic History  . Marital status: Married    Spouse name: Not on file  . Number of children: Not on file  . Years of education: Not on file  . Highest education level: Not on file  Occupational History  . Not on file  Tobacco Use  . Smoking status: Never Smoker  . Smokeless tobacco: Never Used  Substance and Sexual Activity  . Alcohol use: No  . Drug use: No  . Sexual activity: Not on file  Other Topics Concern  . Not on file  Social History Narrative   Married x 50 years    Social Determinants of Radio broadcast assistant Strain:   . Difficulty of Paying Living Expenses:   Food Insecurity:   . Worried About Charity fundraiser in the Last Year:   . Arboriculturist in the Last Year:   Transportation Needs:   . Film/video editor (Medical):   Marland Kitchen Lack of Transportation (Non-Medical):   Physical Activity:   . Days of Exercise per Week:   . Minutes of Exercise per Session:   Stress:   . Feeling of Stress :   Social Connections:   . Frequency of Communication with Friends and Family:   . Frequency of Social Gatherings with Friends and Family:   . Attends Religious Services:   . Active Member of Clubs or Organizations:   . Attends Archivist Meetings:   Marland Kitchen Marital Status:    Vitals:  04/16/20 0810 04/16/20 0835  BP: (!) 160/100 (!) 160/80  Pulse: 95   Resp: 12   Temp: 97.6 F (36.4 C)   SpO2: 99%    Body mass index is 23.42 kg/m.  Physical Exam  Nursing note and vitals reviewed. Constitutional: He is oriented to person, place, and time. He appears well-developed and well-nourished. No distress.  HENT:  Head: Normocephalic and atraumatic.  Mouth/Throat: Oropharynx is clear and moist and mucous membranes are normal.  Eyes: Pupils are equal, round, and reactive to light. Conjunctivae are normal.  Cardiovascular: Normal rate and regular rhythm.  No murmur  heard. DP pulses present bilateral.  Respiratory: Effort normal and breath sounds normal. No respiratory distress.  GI: Soft. He exhibits no mass. There is no hepatomegaly. There is no abdominal tenderness.  Musculoskeletal:        General: Edema (Trace pitting LE edema,bilateral) present.  Lymphadenopathy:    He has no cervical adenopathy.  Neurological: He is alert and oriented to person, place, and time. He has normal strength. No cranial nerve deficit.  Stable gait, not assisted.  Skin: Skin is warm. No rash noted. No erythema.  Psychiatric: He has a normal mood and affect.  Well groomed, good eye contact.   ASSESSMENT AND PLAN:   [name] was seen today for chronic disease management.  Orders Placed This Encounter  Procedures  . 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 He is reporting adequate BP's at home. We discussed possible complications of elevated BP. No changes in current management. Instructed about warning signs. Instructed to bring BP monitor next visit.  Hyperlipidemia He has refused statins. Continue non pharmacologic treatment. Further recommendations will be given according to lab results.  CKD (chronic kidney disease), stage III (HCC) Stable. Continue low salt diet and avoidance of nephrotoxic agents. Adequate hydration and adequate BP controlled. Following with nephrologist.   Return in about 4 weeks (around 05/14/2020) for BP check and AWV.   Joeanne Robicheaux G. [place], MD  [facility]. [place] office.   A few things to remember from today's visit:  Blood pressure 160/80. No changes today. Please bring your blood pressure monitor next visit.   If you need  refills please call your pharmacy. Do not use My Chart to request refills or for acute issues that need immediate attention.   Please be sure medication list is accurate. If a new problem present, please set up appointment sooner than planned today.

## 2020-04-19 ENCOUNTER — Encounter: Payer: Self-pay | Admitting: Family Medicine

## 2020-04-19 MED ORDER — AMLODIPINE BESYLATE 10 MG PO TABS: 10.0000 mg | ORAL_TABLET | Freq: Every morning | ORAL | 3 refills | Status: DC

## 2020-05-17 ENCOUNTER — Other Ambulatory Visit: Payer: Self-pay

## 2020-05-18 ENCOUNTER — Ambulatory Visit (INDEPENDENT_AMBULATORY_CARE_PROVIDER_SITE_OTHER): Payer: Medicare Other | Admitting: Family Medicine

## 2020-05-18 ENCOUNTER — Encounter: Payer: Self-pay | Admitting: Family Medicine

## 2020-05-18 VITALS — BP 140/60 | HR 98 | Temp 97.4°F | Resp 12 | Ht 68.0 in | Wt 153.2 lb

## 2020-05-18 DIAGNOSIS — R748 Abnormal levels of other serum enzymes: Secondary | ICD-10-CM | POA: Insufficient documentation

## 2020-05-18 DIAGNOSIS — I10 Essential (primary) hypertension: Secondary | ICD-10-CM | POA: Diagnosis not present

## 2020-05-18 DIAGNOSIS — N1831 Chronic kidney disease, stage 3a: Secondary | ICD-10-CM | POA: Diagnosis not present

## 2020-05-18 NOTE — Assessment & Plan Note (Signed)
BP today adequately controlled. BP checked with his monitor BP monitor seems to be accurate.

## 2020-05-18 NOTE — Assessment & Plan Note (Signed)
We discussed possible etiologies. Probably has been a stable. Instructed about warning signs. Further recommendation will be given according to GGT and LFT results.

## 2020-05-18 NOTE — Patient Instructions (Signed)
A few things to remember from today's visit:   Continue following with your kidney doctor. Monitor blood pressure periodically. Low salt diet.  If you need refills please call your pharmacy. Do not use My Chart to request refills or for acute issues that need immediate attention.    Please be sure medication list is accurate. If a new problem present, please set up appointment sooner than planned today.

## 2020-05-18 NOTE — Progress Notes (Signed)
HPI: Douglas Matthews is a 85 y.o. male, who is here today to follow on recent OV/.  He was last seen on 04/16/2020, when BP was elevated at 160/100. Currently he is on amlodipine 10 mg daily. Home BP readings 120s-130s/80s. Occasionally SBP 140s. Negative for headache, visual changes, CP, dyspnea, palpitations, focal neurologic deficit, or edema.  CKD III: Following with nephrologist every 6 months. Negative for decreased urine output, foamy urine, ocular hematuria. Denies back pain or changes in urinary frequency.  He has had elevated alkaline phosphatase for a while now. Negative for abdominal pain, N/V/, or jaundice.  Denies back pain or changes in urinary frequency.  Lab Results  Component Value Date   ALT 12 04/19/2018   AST 16 04/19/2018   ALKPHOS 152 (H) 04/19/2018   BILITOT 0.4 04/19/2018   Review of Systems  Constitutional: Negative for activity change, appetite change, fatigue and fever.  HENT: Negative for nosebleeds and sore throat.   Respiratory: Negative for cough and wheezing.   Gastrointestinal: Negative for abdominal pain, nausea and vomiting.  Musculoskeletal: Negative for gait problem and myalgias.  Neurological: Negative for syncope and weakness.  Psychiatric/Behavioral: Negative for behavioral problems and confusion.  Rest see pertinent positives and negatives per HPI.   Current Outpatient Medications on File Prior to Visit  Medication Sig Dispense Refill  . amLODipine (NORVASC) 10 MG tablet Take 1 tablet (10 mg total) by mouth every morning. 90 tablet 3  . aspirin 81 MG tablet Take 81 mg by mouth daily.      No current facility-administered medications on file prior to visit.     Past Medical History:  Diagnosis Date  . Anemia   . BPH (benign prostatic hypertrophy)   . Diverticulosis   . Hyperlipidemia   . Hypertension    No Known Allergies  Social History   Socioeconomic History  . Marital status: Married    Spouse name:  Not on file  . Number of children: Not on file  . Years of education: Not on file  . Highest education level: Not on file  Occupational History  . Not on file  Tobacco Use  . Smoking status: Never Smoker  . Smokeless tobacco: Never Used  Substance and Sexual Activity  . Alcohol use: No  . Drug use: No  . Sexual activity: Not on file  Other Topics Concern  . Not on file  Social History Narrative   Married x 50 years    Social Determinants of Radio broadcast assistant Strain:   . Difficulty of Paying Living Expenses:   Food Insecurity:   . Worried About Charity fundraiser in the Last Year:   . Arboriculturist in the Last Year:   Transportation Needs:   . Film/video editor (Medical):   Marland Kitchen Lack of Transportation (Non-Medical):   Physical Activity:   . Days of Exercise per Week:   . Minutes of Exercise per Session:   Stress:   . Feeling of Stress :   Social Connections:   . Frequency of Communication with Friends and Family:   . Frequency of Social Gatherings with Friends and Family:   . Attends Religious Services:   . Active Member of Clubs or Organizations:   . Attends Archivist Meetings:   Marland Kitchen Marital Status:     Vitals:   05/18/20 1144  BP: 140/60  Pulse: 98  Resp: 12  Temp: (!) 97.4 F (36.3 C)  SpO2: 98%   Body mass index is 23.3 kg/m.  Physical Exam  Nursing note reviewed. Constitutional: He is oriented to person, place, and time. He appears well-developed. No distress.  HENT:  Head: Normocephalic and atraumatic.  Mouth/Throat: Mucous membranes are moist.  Eyes: Pupils are equal, round, and reactive to light. Conjunctivae are normal. No scleral icterus.  Cardiovascular: Normal rate and regular rhythm.  No murmur heard. Respiratory: Effort normal and breath sounds normal. No respiratory distress.  GI: Soft. Normal appearance. He exhibits no mass. There is no hepatomegaly. There is no abdominal tenderness.  Lymphadenopathy:    He has  no cervical adenopathy.  Neurological: He is alert and oriented to person, place, and time. No cranial nerve deficit. Gait normal.  Skin: Skin is warm. No rash noted. No erythema.  Psychiatric:  Well groomed, good eye contact.   ASSESSMENT AND PLAN:  Douglas Matthews was seen today for follow-up.  Diagnoses and all orders for this visit:  Orders Placed This Encounter  Procedures  . Gamma GT  . Hepatic function panel     Essential hypertension BP today adequately controlled. BP checked with his monitor BP monitor seems to be accurate.   CKD (chronic kidney disease), stage III (HCC) Continue avoiding NSAIDs, low-salt diet. Adequate BP control and hydration. Following with nephrologist.   Elevated alkaline phosphatase level We discussed possible etiologies. Probably has been a stable. Instructed about warning signs. Further recommendation will be given according to GGT and LFT results.   Return in about 1 year (around 05/01/2021) for awv and f/u. He follows with nephrologist q 6 months.   Ashling Roane G. [place], MD  [facility]. [place] office.

## 2020-05-18 NOTE — Assessment & Plan Note (Signed)
Continue avoiding NSAIDs, low-salt diet. Adequate BP control and hydration. Following with nephrologist.

## 2020-05-21 ENCOUNTER — Other Ambulatory Visit: Payer: Self-pay

## 2020-05-22 ENCOUNTER — Other Ambulatory Visit (INDEPENDENT_AMBULATORY_CARE_PROVIDER_SITE_OTHER): Payer: Medicare Other

## 2020-05-22 DIAGNOSIS — R748 Abnormal levels of other serum enzymes: Secondary | ICD-10-CM | POA: Diagnosis not present

## 2020-05-22 LAB — HEPATIC FUNCTION PANEL
ALT: 11 U/L (ref 0–53)
AST: 16 U/L (ref 0–37)
Albumin: 4.3 g/dL (ref 3.5–5.2)
Alkaline Phosphatase: 187 U/L — ABNORMAL HIGH (ref 39–117)
Bilirubin, Direct: 0.1 mg/dL (ref 0.0–0.3)
Total Bilirubin: 0.4 mg/dL (ref 0.2–1.2)
Total Protein: 7.2 g/dL (ref 6.0–8.3)

## 2020-05-22 LAB — GAMMA GT: GGT: 23 U/L (ref 7–51)

## 2020-05-22 NOTE — Addendum Note (Signed)
Addended by: Lerry Liner on: 05/22/2020 09:47 AM   Modules accepted: Orders

## 2020-06-04 ENCOUNTER — Encounter: Payer: Self-pay | Admitting: Family Medicine

## 2020-06-04 DIAGNOSIS — H35033 Hypertensive retinopathy, bilateral: Secondary | ICD-10-CM | POA: Insufficient documentation

## 2020-06-06 ENCOUNTER — Other Ambulatory Visit: Payer: Self-pay

## 2020-06-06 DIAGNOSIS — R748 Abnormal levels of other serum enzymes: Secondary | ICD-10-CM

## 2020-06-07 ENCOUNTER — Other Ambulatory Visit: Payer: Self-pay

## 2020-06-07 ENCOUNTER — Ambulatory Visit (INDEPENDENT_AMBULATORY_CARE_PROVIDER_SITE_OTHER)
Admission: RE | Admit: 2020-06-07 | Discharge: 2020-06-07 | Disposition: A | Discharge status: Home / Self Care | Payer: Medicare Other | Source: Ambulatory Visit | Attending: Family Medicine | Admitting: Family Medicine

## 2020-06-07 DIAGNOSIS — M8888 Osteitis deformans of other bones: Secondary | ICD-10-CM | POA: Diagnosis not present

## 2020-06-07 DIAGNOSIS — M898X8 Other specified disorders of bone, other site: Secondary | ICD-10-CM | POA: Diagnosis not present

## 2020-06-07 DIAGNOSIS — R748 Abnormal levels of other serum enzymes: Secondary | ICD-10-CM

## 2020-06-07 DIAGNOSIS — M810 Age-related osteoporosis without current pathological fracture: Secondary | ICD-10-CM | POA: Diagnosis not present

## 2020-06-10 ENCOUNTER — Other Ambulatory Visit: Payer: Self-pay | Admitting: Family Medicine

## 2020-06-10 DIAGNOSIS — M889 Osteitis deformans of unspecified bone: Secondary | ICD-10-CM

## 2020-06-13 ENCOUNTER — Other Ambulatory Visit (INDEPENDENT_AMBULATORY_CARE_PROVIDER_SITE_OTHER): Payer: Medicare Other

## 2020-06-13 ENCOUNTER — Other Ambulatory Visit: Payer: Self-pay

## 2020-06-13 DIAGNOSIS — M889 Osteitis deformans of unspecified bone: Secondary | ICD-10-CM

## 2020-06-14 LAB — BASIC METABOLIC PANEL
BUN/Creatinine Ratio: 23 (calc) — ABNORMAL HIGH (ref 6–22)
BUN: 55 mg/dL — ABNORMAL HIGH (ref 7–25)
CO2: 21 mmol/L (ref 20–32)
Calcium: 9.3 mg/dL (ref 8.6–10.3)
Chloride: 108 mmol/L (ref 98–110)
Creat: 2.37 mg/dL — ABNORMAL HIGH (ref 0.70–1.11)
Glucose, Bld: 93 mg/dL (ref 65–99)
Potassium: 5.1 mmol/L (ref 3.5–5.3)
Sodium: 140 mmol/L (ref 135–146)

## 2020-06-14 LAB — VITAMIN D 25 HYDROXY (VIT D DEFICIENCY, FRACTURES): Vit D, 25-Hydroxy: 29 ng/mL — ABNORMAL LOW (ref 30–100)

## 2020-06-26 ENCOUNTER — Ambulatory Visit (INDEPENDENT_AMBULATORY_CARE_PROVIDER_SITE_OTHER): Payer: Medicare Other

## 2020-06-26 DIAGNOSIS — Z0000 Encounter for general adult medical examination without abnormal findings: Secondary | ICD-10-CM

## 2020-06-26 NOTE — Progress Notes (Signed)
Virtual Visit via Telephone Note  I connected with  Douglas Matthews on 06/26/20 at  2:30 PM EDT by telephone and verified that I am speaking with the correct person using two identifiers.  Medicare Annual Wellness visit completed telephonically due to Covid-19 pandemic.   Persons participating in this call: This Health Coach and this patient.   Location: Patient: Home Provider: Office   I discussed the limitations, risks, security and privacy concerns of performing an evaluation and management service by telephone and the availability of in person appointments. The patient expressed understanding and agreed to proceed.  Unable to perform video visit due to video visit attempted and failed and/or patient does not have video capability.   Some vital signs may be absent or patient reported.   Douglas Schlein, LPN    Subjective:   Douglas Matthews is a 85 y.o. male who presents for Medicare Annual/Subsequent preventive examination.  Review of Systems     Cardiac Risk Factors include: dyslipidemia;hypertension;male gender     Objective:    There were no vitals filed for this visit. There is no height or weight on file to calculate BMI.  Advanced Directives 06/26/2020 04/17/2017  Does Patient Have a Medical Advance Directive? Yes Yes  Type of Estate agent of [place];Living will -  Copy of Healthcare Power of Attorney in Chart? No - copy requested -    Current Medications (verified) Outpatient Encounter Medications as of 06/26/2020  Medication Sig  . amLODipine (NORVASC) 10 MG tablet Take 1 tablet (10 mg total) by mouth every morning.  Marland Kitchen aspirin 81 MG tablet Take 81 mg by mouth daily.   . Multiple Vitamin (MULTIVITAMIN) capsule Take 1 capsule by mouth daily. One a day   No facility-administered encounter medications on file as of 06/26/2020.    Allergies (verified) Patient has no known allergies.   History: Past Medical History:  Diagnosis Date    . Anemia   . BPH (benign prostatic hypertrophy)   . Diverticulosis   . Hyperlipidemia   . Hypertension    Past Surgical History:  Procedure Laterality Date  . APPENDECTOMY    . HERNIA REPAIR     Family History  Problem Relation Age of Onset  . Heart disease Other    Social History   Socioeconomic History  . Marital status: Married    Spouse name: Not on file  . Number of children: Not on file  . Years of education: Not on file  . Highest education level: Not on file  Occupational History  . Occupation: Retired  Tobacco Use  . Smoking status: Never Smoker  . Smokeless tobacco: Never Used  Substance and Sexual Activity  . Alcohol use: No  . Drug use: No  . Sexual activity: Not on file  Other Topics Concern  . Not on file  Social History Narrative   Married x 50 years    Social Determinants of Health   Financial Resource Strain: Low Risk   . Difficulty of Paying Living Expenses: Not hard at all  Food Insecurity: No Food Insecurity  . Worried About Programme researcher, broadcasting/film/video in the Last Year: Never true  . Ran Out of Food in the Last Year: Never true  Transportation Needs: No Transportation Needs  . Lack of Transportation (Medical): No  . Lack of Transportation (Non-Medical): No  Physical Activity: Insufficiently Active  . Days of Exercise per Week: 3 days  . Minutes of Exercise per Session: 30 min  Stress: No Stress Concern Present  . Feeling of Stress : Not at all  Social Connections: Moderately Integrated  . Frequency of Communication with Friends and Family: More than three times a week  . Frequency of Social Gatherings with Friends and Family: Twice a week  . Attends Religious Services: More than 4 times per year  . Active Member of Clubs or Organizations: No  . Attends Banker Meetings: Never  . Marital Status: Married    Tobacco Counseling Counseling given: Not Answered   Clinical Intake:  Pre-visit preparation completed: Yes  Pain :  No/denies pain     BMI - recorded: 23.31 Nutritional Status: BMI of 19-24  Normal Diabetes: No  How often do you need to have someone help you when you read instructions, pamphlets, or other written materials from your doctor or pharmacy?: 1 - Never  Diabetic?No  Interpreter Needed?: No  Information entered by :: Douglas Ensign, LPN   Activities of Daily Living In your present state of health, do you have any difficulty performing the following activities: 06/26/2020 05/18/2020  Hearing? N N  Vision? N N  Difficulty concentrating or making decisions? N N  Walking or climbing stairs? N N  Dressing or bathing? N N  Doing errands, shopping? N N  Preparing Food and eating ? N -  Using the Toilet? N -  In the past six months, have you accidently leaked urine? N -  Do you have problems with loss of bowel control? N -  Managing your Medications? N -  Managing your Finances? N -  Housekeeping or managing your Housekeeping? N -  Some recent data might be hidden    Patient Care Team: [place], [name], MD as PCP - General (Family Medicine)  Indicate any recent Medical Services you may have received from other than [facility] providers in the past year (date may be approximate).     Assessment:   This is a routine wellness examination for [facility].  Hearing/Vision screen  Hearing Screening   125Hz  250Hz  500Hz  1000Hz  2000Hz  3000Hz  4000Hz  6000Hz  8000Hz   Right ear:           Left ear:           Comments: Pt denies any hearing difficulty at this time  Vision Screening Comments: Wears eyeglasses and follows up with Dr annually   Dietary issues and exercise activities discussed: Current Exercise Habits: Structured exercise class, Type of exercise: walking;strength training/weights;Other - see comments (staionary bike ride), Time (Minutes): 30, Frequency (Times/Week): 3, Weekly Exercise (Minutes/Week): 90, Intensity: Mild  Goals    . patient     To maintain current level of exercise  and engagement    . Patient Stated     Stay healthy      Depression Screen PHQ 2/9 Scores 06/26/2020 04/16/2020 04/29/2019 04/19/2018 04/17/2017 04/22/2016 04/26/2015  PHQ - 2 Score 0 0 0 0 0 0 0    Fall Risk Fall Risk  06/26/2020 05/18/2020 04/29/2019 04/19/2018 04/17/2017  Falls in the past year? 0 0 0 No No  Number falls in past yr: 0 0 0 - -  Injury with Fall? 0 0 0 - -  Risk for fall due to : Impaired vision - Impaired balance/gait - -  Follow up Falls prevention discussed - Education provided - -    Any stairs in or around the home? Yes  If so, are there any without handrails? No  Home free of loose throw rugs in  walkways, pet beds, electrical cords, etc? Yes  Adequate lighting in your home to reduce risk of falls? Yes   ASSISTIVE DEVICES UTILIZED TO PREVENT FALLS:  Life alert? No  Use of a cane, walker or w/c? No  Grab bars in the bathroom? Yes  Shower chair or bench in shower? Yes  Elevated toilet seat or a handicapped toilet? Yes       Cognitive Function: MMSE - Mini Mental State Exam 04/17/2017  Not completed: (No Data)     6CIT Screen 06/26/2020  What Year? 0 points  What month? 3 points  What time? 0 points  Count back from 20 0 points  Months in reverse 0 points  Repeat phrase 0 points  Total Score 3    Immunizations Immunization History  Administered Date(s) Administered  . Influenza Split 12/15/2011  . Influenza Whole 09/01/2003, 09/19/2010  . Influenza, High Dose Seasonal PF 09/22/2017  . Influenza-Unspecified 08/31/2014  . PFIZER SARS-COV-2 Vaccination 12/23/2019, 01/22/2020  . Pneumococcal Conjugate-13 01/09/2014  . Pneumococcal Polysaccharide-23 12/01/2001, 06/13/2008  . Td 12/01/1996, 06/13/2008  . Tdap 07/16/2018  . Zoster Recombinat (Shingrix) 04/13/2018, 09/01/2018    TDAP status: Up to date Flu Vaccine status: Up to date Pneumococcal vaccine status: Up to date Covid-19 vaccine status: Completed vaccines  Qualifies for Shingles Vaccine?  Yes   Zostavax completed No   Shingrix Completed?: Yes  Screening Tests Health Maintenance  Topic Date Due  . INFLUENZA VACCINE  07/01/2020  . TETANUS/TDAP  07/16/2028  . COVID-19 Vaccine  Completed  . PNA vac Low Risk Adult  Completed    Health Maintenance  There are no preventive care reminders to display for this patient.  Colorectal cancer screening: Completed 09/22/2008. Repeat every 10 years     Additional Screening:   Vision Screening: Recommended annual ophthalmology exams for early detection of glaucoma and other disorders of the eye. Is the patient up to date with their annual eye exam?  Yes  Who is the provider or what is the name of the office in which the patient attends annual eye exams?   Dental Screening: Recommended annual dental exams for proper oral hygiene  Community Resource Referral / Chronic Care Management: CRR required this visit?  No   CCM required this visit?  No      Plan:     I have personally reviewed and noted the following in the patient's chart:   . Medical and social history . Use of alcohol, tobacco or illicit drugs  . Current medications and supplements . Functional ability and status . Nutritional status . Physical activity . Advanced directives . List of other physicians . Hospitalizations, surgeries, and ER visits in previous 12 months . Vitals . Screenings to include cognitive, depression, and falls . Referrals and appointments  In addition, I have reviewed and discussed with patient certain preventive protocols, quality metrics, and best practice recommendations. A written personalized care plan for preventive services as well as general preventive health recommendations were provided to patient.     Douglas Schlein, LPN   [id number]   Nurse Notes: None

## 2020-06-26 NOTE — Patient Instructions (Addendum)
Douglas Matthews , Thank you for taking time to come for your Medicare Wellness Visit. I appreciate your ongoing commitment to your health goals. Please review the following plan we discussed and let me know if I can assist you in the future.   Screening recommendations/referrals: Colonoscopy: No longer required Recommended yearly ophthalmology/optometry visit for glaucoma screening and checkup Recommended yearly dental visit for hygiene and checkup  Vaccinations: Influenza vaccine: Up to date Pneumococcal vaccine: Up to date Tdap vaccine: Up to date Shingles vaccine: Completed 5/14 & 09/01/18   Covid-19: 1st dose 12/23/19 2nd Dose 01/13/20  Advanced directives: Please bring a copy of your health care power of attorney and living will to the office at your convenience.  Conditions/risks identified: Stay healthy  Next appointment: Follow up in one year for your annual wellness visit.   Preventive Care 2 Years and Older, Male Preventive care refers to lifestyle choices and visits with your health care provider that can promote health and wellness. What does preventive care include?  A yearly physical exam. This is also called an annual well check.  Dental exams once or twice a year.  Routine eye exams. Ask your health care provider how often you should have your eyes checked.  Personal lifestyle choices, including:  Daily care of your teeth and gums.  Regular physical activity.  Eating a healthy diet.  Avoiding tobacco and drug use.  Limiting alcohol use.  Practicing safe sex.  Taking low doses of aspirin every day.  Taking vitamin and mineral supplements as recommended by your health care provider. What happens during an annual well check? The services and screenings done by your health care provider during your annual well check will depend on your age, overall health, lifestyle risk factors, and family history of disease. Counseling  Your health care provider may ask you  questions about your:  Alcohol use.  Tobacco use.  Drug use.  Emotional well-being.  Home and relationship well-being.  Sexual activity.  Eating habits.  History of falls.  Memory and ability to understand (cognition).  Work and work Astronomer. Screening  You may have the following tests or measurements:  Height, weight, and BMI.  Blood pressure.  Lipid and cholesterol levels. These may be checked every 5 years, or more frequently if you are over 37 years old.  Skin check.  Lung cancer screening. You may have this screening every year starting at age 26 if you have a 30-pack-year history of smoking and currently smoke or have quit within the past 15 years.  Fecal occult blood test (FOBT) of the stool. You may have this test every year starting at age 64.  Flexible sigmoidoscopy or colonoscopy. You may have a sigmoidoscopy every 5 years or a colonoscopy every 10 years starting at age 33.  Prostate cancer screening. Recommendations will vary depending on your family history and other risks.  Hepatitis C blood test.  Hepatitis B blood test.  Sexually transmitted disease (STD) testing.  Diabetes screening. This is done by checking your blood sugar (glucose) after you have not eaten for a while (fasting). You may have this done every 1-3 years.  Abdominal aortic aneurysm (AAA) screening. You may need this if you are a current or former smoker.  Osteoporosis. You may be screened starting at age 66 if you are at high risk. Talk with your health care provider about your test results, treatment options, and if necessary, the need for more tests. Vaccines  Your health care provider may recommend  certain vaccines, such as:  Influenza vaccine. This is recommended every year.  Tetanus, diphtheria, and acellular pertussis (Tdap, Td) vaccine. You may need a Td booster every 10 years.  Zoster vaccine. You may need this after age 52.  Pneumococcal 13-valent conjugate  (PCV13) vaccine. One dose is recommended after age 56.  Pneumococcal polysaccharide (PPSV23) vaccine. One dose is recommended after age 34. Talk to your health care provider about which screenings and vaccines you need and how often you need them. This information is not intended to replace advice given to you by your health care provider. Make sure you discuss any questions you have with your health care provider. Document Released: 12/14/2015 Document Revised: 08/06/2016 Document Reviewed: 09/18/2015 Elsevier Interactive Patient Education  2017 [employer] Prevention in the Home Falls can cause injuries. They can happen to people of all ages. There are many things you can do to make your home safe and to help prevent falls. What can I do on the outside of my home?  Regularly fix the edges of walkways and driveways and fix any cracks.  Remove anything that might make you trip as you walk through a door, such as a raised step or threshold.  Trim any bushes or trees on the path to your home.  Use bright outdoor lighting.  Clear any walking paths of anything that might make someone trip, such as rocks or tools.  Regularly check to see if handrails are loose or broken. Make sure that both sides of any steps have handrails.  Any raised decks and porches should have guardrails on the edges.  Have any leaves, snow, or ice cleared regularly.  Use sand or salt on walking paths during winter.  Clean up any spills in your garage right away. This includes oil or grease spills. What can I do in the bathroom?  Use night lights.  Install grab bars by the toilet and in the tub and shower. Do not use towel bars as grab bars.  Use non-skid mats or decals in the tub or shower.  If you need to sit down in the shower, use a plastic, non-slip stool.  Keep the floor dry. Clean up any water that spills on the floor as soon as it happens.  Remove soap buildup in the tub or shower  regularly.  Attach bath mats securely with double-sided non-slip rug tape.  Do not have throw rugs and other things on the floor that can make you trip. What can I do in the bedroom?  Use night lights.  Make sure that you have a light by your bed that is easy to reach.  Do not use any sheets or blankets that are too big for your bed. They should not hang down onto the floor.  Have a firm chair that has side arms. You can use this for support while you get dressed.  Do not have throw rugs and other things on the floor that can make you trip. What can I do in the kitchen?  Clean up any spills right away.  Avoid walking on wet floors.  Keep items that you use a lot in easy-to-reach places.  If you need to reach something above you, use a strong step stool that has a grab bar.  Keep electrical cords out of the way.  Do not use floor polish or wax that makes floors slippery. If you must use wax, use non-skid floor wax.  Do not have throw rugs and other  things on the floor that can make you trip. What can I do with my stairs?  Do not leave any items on the stairs.  Make sure that there are handrails on both sides of the stairs and use them. Fix handrails that are broken or loose. Make sure that handrails are as long as the stairways.  Check any carpeting to make sure that it is firmly attached to the stairs. Fix any carpet that is loose or worn.  Avoid having throw rugs at the top or bottom of the stairs. If you do have throw rugs, attach them to the floor with carpet tape.  Make sure that you have a light switch at the top of the stairs and the bottom of the stairs. If you do not have them, ask someone to add them for you. What else can I do to help prevent falls?  Wear shoes that:  Do not have high heels.  Have rubber bottoms.  Are comfortable and fit you well.  Are closed at the toe. Do not wear sandals.  If you use a stepladder:  Make sure that it is fully  opened. Do not climb a closed stepladder.  Make sure that both sides of the stepladder are locked into place.  Ask someone to hold it for you, if possible.  Clearly mark and make sure that you can see:  Any grab bars or handrails.  First and last steps.  Where the edge of each step is.  Use tools that help you move around (mobility aids) if they are needed. These include:  Canes.  Walkers.  Scooters.  Crutches.  Turn on the lights when you go into a dark area. Replace any light bulbs as soon as they burn out.  Set up your furniture so you have a clear path. Avoid moving your furniture around.  If any of your floors are uneven, fix them.  If there are any pets around you, be aware of where they are.  Review your medicines with your doctor. Some medicines can make you feel dizzy. This can increase your chance of falling. Ask your doctor what other things that you can do to help prevent falls. This information is not intended to replace advice given to you by your health care provider. Make sure you discuss any questions you have with your health care provider. Document Released: 09/13/2009 Document Revised: 04/24/2016 Document Reviewed: 12/22/2014 Elsevier Interactive Patient Education  2017 [employer].

## 2020-08-14 DIAGNOSIS — D638 Anemia in other chronic diseases classified elsewhere: Secondary | ICD-10-CM | POA: Diagnosis not present

## 2020-08-14 DIAGNOSIS — E875 Hyperkalemia: Secondary | ICD-10-CM | POA: Diagnosis not present

## 2020-08-14 DIAGNOSIS — N2581 Secondary hyperparathyroidism of renal origin: Secondary | ICD-10-CM | POA: Diagnosis not present

## 2020-08-14 DIAGNOSIS — Z9114 Patient's other noncompliance with medication regimen: Secondary | ICD-10-CM | POA: Diagnosis not present

## 2020-08-14 DIAGNOSIS — E785 Hyperlipidemia, unspecified: Secondary | ICD-10-CM | POA: Diagnosis not present

## 2020-08-14 DIAGNOSIS — I129 Hypertensive chronic kidney disease with stage 1 through stage 4 chronic kidney disease, or unspecified chronic kidney disease: Secondary | ICD-10-CM | POA: Diagnosis not present

## 2020-08-14 DIAGNOSIS — N1832 Chronic kidney disease, stage 3b: Secondary | ICD-10-CM | POA: Diagnosis not present

## 2020-09-03 DIAGNOSIS — Z23 Encounter for immunization: Secondary | ICD-10-CM | POA: Diagnosis not present

## 2021-01-07 DIAGNOSIS — H35033 Hypertensive retinopathy, bilateral: Secondary | ICD-10-CM | POA: Diagnosis not present

## 2021-01-07 DIAGNOSIS — H2513 Age-related nuclear cataract, bilateral: Secondary | ICD-10-CM | POA: Diagnosis not present

## 2021-01-07 DIAGNOSIS — H18511 Endothelial corneal dystrophy, right eye: Secondary | ICD-10-CM | POA: Diagnosis not present

## 2021-01-07 DIAGNOSIS — H40013 Open angle with borderline findings, low risk, bilateral: Secondary | ICD-10-CM | POA: Diagnosis not present

## 2021-01-29 DIAGNOSIS — I129 Hypertensive chronic kidney disease with stage 1 through stage 4 chronic kidney disease, or unspecified chronic kidney disease: Secondary | ICD-10-CM | POA: Diagnosis not present

## 2021-01-29 DIAGNOSIS — E785 Hyperlipidemia, unspecified: Secondary | ICD-10-CM | POA: Diagnosis not present

## 2021-01-29 DIAGNOSIS — D638 Anemia in other chronic diseases classified elsewhere: Secondary | ICD-10-CM | POA: Diagnosis not present

## 2021-01-29 DIAGNOSIS — Z9114 Patient's other noncompliance with medication regimen: Secondary | ICD-10-CM | POA: Diagnosis not present

## 2021-01-29 DIAGNOSIS — E875 Hyperkalemia: Secondary | ICD-10-CM | POA: Diagnosis not present

## 2021-01-29 DIAGNOSIS — N1832 Chronic kidney disease, stage 3b: Secondary | ICD-10-CM | POA: Diagnosis not present

## 2021-01-29 DIAGNOSIS — N2581 Secondary hyperparathyroidism of renal origin: Secondary | ICD-10-CM | POA: Diagnosis not present

## 2021-04-12 DIAGNOSIS — Z23 Encounter for immunization: Secondary | ICD-10-CM | POA: Diagnosis not present

## 2021-04-21 DIAGNOSIS — M889 Osteitis deformans of unspecified bone: Secondary | ICD-10-CM | POA: Insufficient documentation

## 2021-04-21 NOTE — Progress Notes (Deleted)
HPI: Douglas Matthews is a 86 y.o. male, who is here today for *** months follow up.   *** was last seen on *** Last AWV on 04/29/19.  {4+ HPI elements (or status of 3 or more chronic diseases)} *** HLD: He in on non pharmacologic treatment. He has not been interested in statin.  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  Lab Results  Component Value Date   CREATININE 2.37 (H) 06/13/2020   BUN 55 (H) 06/13/2020   NA 140 06/13/2020   K 5.1 06/13/2020   CL 108 06/13/2020   CO2 21 06/13/2020   Elevated alkaline phosphatase. 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  Lab Results  Component Value Date   ALT 11 05/22/2020   AST 16 05/22/2020   ALKPHOS 187 (H) 05/22/2020   BILITOT 0.4 05/22/2020   He has had abnormal PSA, evaluated by urologist. *** Lab Results  Component Value Date   PSA 4.13 (H) 04/17/2017   PSA 3.61 04/22/2016   PSA 2.59 01/09/2014     Review of Systems Rest of ROS, see pertinent positives sand negatives in HPI [review of 2 to 9 systems] ***  Current Outpatient Medications on File Prior to Visit  Medication Sig Dispense Refill  . amLODipine (NORVASC) 10 MG tablet Take 1 tablet (10 mg total) by mouth every morning. 90 tablet 3  . aspirin 81 MG tablet Take 81 mg by mouth daily.     . COD LIVER OIL PO Take by mouth.    Marland Kitchen GARLIC PO Take by mouth.    . Multiple Vitamin (MULTIVITAMIN) capsule Take 1 capsule by mouth daily. One a day    . saw palmetto 500 MG capsule Take 500 mg by mouth daily.     No current facility-administered medications on file prior to visit.     Past Medical History:  Diagnosis Date  . Anemia    . BPH (benign prostatic hypertrophy)   . Diverticulosis   . Hyperlipidemia   . Hypertension    No Known Allergies  Social History   Socioeconomic History  . Marital status: Married    Spouse name: Not on file  . Number of children: Not on file  . Years of education: Not on file  . Highest education level: Not on file  Occupational History  . Occupation: Retired  Tobacco Use  . Smoking status: Never Smoker  . Smokeless tobacco: Never Used  Substance and Sexual Activity  . Alcohol use: No  . Drug use: No  . Sexual activity: Not on file  Other Topics Concern  . Not on file  Social History Narrative   Married x 50 years    Social Determinants of Health   Financial Resource Strain: Low Risk   . Difficulty of Paying Living Expenses: Not hard at all  Food Insecurity: No Food Insecurity  . Worried About Programme researcher, broadcasting/film/video in the Last Year: Never true  . Ran Out of Food in the Last Year: Never true  Transportation Needs: No Transportation Needs  . Lack of Transportation (Medical): No  . Lack  of Transportation (Non-Medical): No  Physical Activity: Insufficiently Active  . Days of Exercise per Week: 3 days  . Minutes of Exercise per Session: 30 min  Stress: No Stress Concern Present  . Feeling of Stress : Not at all  Social Connections: Moderately Integrated  . Frequency of Communication with Friends and Family: More than three times a week  . Frequency of Social Gatherings with Friends and Family: Twice a week  . Attends Religious Services: More than 4 times per year  . Active Member of Clubs or Organizations: No  . Attends Banker Meetings: Never  . Marital Status: Married    There were no vitals filed for this visit. There is no height or weight on file to calculate BMI.   Physical Exam  {[12+ exam elements]} ***  ASSESSMENT AND PLAN:   Douglas Matthews was seen today for *** months follow-up.  No orders of the defined types were placed  in this encounter.   No problem-specific Assessment & Plan notes found for this encounter.    No follow-ups on file.   Douglas Goyer G. [place], MD  [facility] [place] office.

## 2021-04-22 ENCOUNTER — Encounter: Payer: Medicare Other | Admitting: Family Medicine

## 2021-04-22 DIAGNOSIS — E785 Hyperlipidemia, unspecified: Secondary | ICD-10-CM

## 2021-04-22 DIAGNOSIS — M889 Osteitis deformans of unspecified bone: Secondary | ICD-10-CM

## 2021-04-22 DIAGNOSIS — Z0000 Encounter for general adult medical examination without abnormal findings: Secondary | ICD-10-CM

## 2021-04-22 DIAGNOSIS — I129 Hypertensive chronic kidney disease with stage 1 through stage 4 chronic kidney disease, or unspecified chronic kidney disease: Secondary | ICD-10-CM

## 2021-04-23 ENCOUNTER — Encounter: Payer: Medicare Other | Admitting: Family Medicine

## 2021-05-07 NOTE — Progress Notes (Signed)
HPI: Douglas Matthews is a 86 y.o. male, who is here today for CPE and follow up.   He was last seen on 05/18/20. No new problems since his last visit. Last AWV on 04/29/19.  HLD: He in on non pharmacologic treatment. He has not been interested in statin.  Lab Results  Component Value Date   CHOL 310 (H) 04/16/2020   HDL 51.40 04/16/2020   LDLCALC 236 (H) 04/16/2020   LDLDIRECT 220.5 12/15/2011   TRIG 117.0 04/16/2020   CHOLHDL 6 04/16/2020   HTN: He is on Amlodipine 10 mg daily.  BP readings at home: 120-130/70-80. 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  Lab Results  Component Value Date   CREATININE 2.37 (H) 06/13/2020   BUN 55 (H) 06/13/2020   NA 140 06/13/2020   K 5.1 06/13/2020   CL 108 06/13/2020   CO2 21 06/13/2020   Elevated alkaline phosphatase/Paget's disease.  He denies having any back pain or arthralgias. 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  Lab Results  Component Value Date   ALT 11 05/22/2020   AST 16 05/22/2020   ALKPHOS 187 (H) 05/22/2020   BILITOT 0.4 05/22/2020   He has had abnormal PSA, evaluated by urologist. According to pt no further work up or follow up was necessary. No changes in urinary frequency or gross hematuria. Lab Results  Component Value Date   PSA 4.13 (H) 04/17/2017   PSA 3.61 04/22/2016   PSA 2.59 01/09/2014   Review of Systems  Constitutional:  Negative for activity change, appetite change, fatigue, fever and unexpected weight change.  HENT:  Negative for nosebleeds, sore throat and trouble swallowing.   Eyes:  Negative for photophobia and pain.  Respiratory:  Negative for cough and wheezing.   Cardiovascular:  Negative for palpitations and leg swelling.  Gastrointestinal:  Negative for  abdominal pain, nausea and vomiting.  Endocrine: Negative for cold intolerance, heat intolerance, polydipsia, polyphagia and polyuria.  Genitourinary:  Negative for decreased urine volume, dysuria and hematuria.  Musculoskeletal:  Negative for gait problem and myalgias.  Skin:  Negative for pallor and rash.  Allergic/Immunologic: Negative for environmental allergies.  Neurological:  Negative for syncope, facial asymmetry, weakness and numbness.  Psychiatric/Behavioral:  Negative for confusion. The patient is not nervous/anxious.   All other systems reviewed and are negative. Rest of ROS, see pertinent positives sand negatives in HPI  Current Outpatient Medications on File Prior to Visit  Medication Sig Dispense Refill   amLODipine (NORVASC) 10 MG tablet Take 1 tablet (10 mg total) by mouth every morning. 90 tablet 3   aspirin 81 MG tablet Take 81 mg by mouth daily.      COD LIVER OIL PO Take by mouth.     GARLIC PO Take by mouth.     Multiple Vitamin (MULTIVITAMIN) capsule Take 1 capsule by mouth daily. One a day     saw palmetto 500 MG capsule Take 500 mg by mouth daily.     No current facility-administered medications on file prior to visit.   Past Medical History:  Diagnosis Date   Anemia    BPH (benign prostatic hypertrophy)    Diverticulosis    Hyperlipidemia    Hypertension    No Known Allergies  Social History  Socioeconomic History   Marital status: Married    Spouse name: Not on file   Number of children: Not on file   Years of education: Not on file   Highest education level: Not on file  Occupational History   Occupation: Retired  Tobacco Use   Smoking status: Never Smoker   Smokeless tobacco: Never Used  Substance and Sexual Activity   Alcohol use: No   Drug use: No   Sexual activity: Not on file  Other Topics Concern   Not on file  Social History Narrative   Married x 50 years    Social Determinants of Radio broadcast assistant Strain: Low Risk     Difficulty of Paying Living Expenses: Not hard at all  Food Insecurity: No Food Insecurity   Worried About Charity fundraiser in the Last Year: Never true   Arboriculturist in the Last Year: Never true  Transportation Needs: No Transportation Needs   Lack of Transportation (Medical): No   Lack of Transportation (Non-Medical): No  Physical Activity: Insufficiently Active   Days of Exercise per Week: 3 days   Minutes of Exercise per Session: 30 min  Stress: No Stress Concern Present   Feeling of Stress : Not at all  Social Connections: Moderately Integrated   Frequency of Communication with Friends and Family: More than three times a week   Frequency of Social Gatherings with Friends and Family: Twice a week   Attends Religious Services: More than 4 times per year   Active Member of Clubs or Organizations: No   Attends Archivist Meetings: Never   Marital Status: Married   Today's Vitals   05/08/21 0843  BP: 136/80  Pulse: 92  SpO2: 99%  Weight: 148 lb (67.1 kg)  Height: _0  (1.727 m)   Wt Readings from Last 3 Encounters:  05/08/21 148 lb (67.1 kg)  05/18/20 153 lb 4 oz (69.5 kg)  04/16/20 154 lb (69.9 kg)   Body mass index is 22.5 kg/m.  Physical Exam Constitutional:      General: He is not in acute distress.    Appearance: He is well-developed. He is not ill-appearing.  HENT:     Head: Atraumatic.     Right Ear: Hearing, tympanic membrane, ear canal and external ear normal.     Left Ear: Hearing, tympanic membrane, ear canal and external ear normal.     Mouth/Throat:     Mouth: Mucous membranes are moist.     Dentition: Has dentures.     Pharynx: Oropharynx is clear.  Eyes:     General: Lids are normal.     Conjunctiva/sclera: Conjunctivae normal.     Pupils: Pupils are equal, round, and reactive to light.  Neck:     Thyroid: No thyroid mass.  Cardiovascular:     Rate and Rhythm: Normal rate and regular rhythm.     Pulses:          Dorsalis  pedis pulses are 2+ on the right side and 2+ on the left side.     Heart sounds: Murmur (Soft SEM apex) heard.  Pulmonary:     Effort: Pulmonary effort is normal.     Breath sounds: No wheezing or rales.  Abdominal:     Palpations: Abdomen is soft. There is no mass.     Tenderness: There is no abdominal tenderness.  Musculoskeletal:        General: No tenderness.  Cervical back: Normal range of motion.  Lymphadenopathy:     Cervical: No cervical adenopathy.  Skin:    General: Skin is warm.  Neurological:     General: No focal deficit present.     Mental Status: He is alert and oriented to person, place, and time.     Cranial Nerves: No cranial nerve deficit.     Sensory: No sensory deficit.     Coordination: Coordination normal.     Gait: Gait normal.     Deep Tendon Reflexes:     Reflex Scores:      Bicep reflexes are 2+ on the right side and 2+ on the left side.      Patellar reflexes are 2+ on the right side and 2+ on the left side.   ASSESSMENT AND PLAN:   Douglas Matthews was seen today for CPE and 12 months follow-up.  Diagnoses and all orders for this visit: Orders Placed This Encounter  Procedures   Lipid panel   Hepatic function panel   TSH   Alkaline phosphatase, bone specific   Lab Results  Component Value Date   TSH 6.37 (H) 05/08/2021   Lab Results  Component Value Date   ALT 13 05/08/2021   AST 18 05/08/2021   ALKPHOS 189 (H) 05/08/2021   BILITOT 0.4 05/08/2021   Lab Results  Component Value Date   CHOL 259 (H) 05/08/2021   HDL 59.40 05/08/2021   LDLCALC 182 (H) 05/08/2021   LDLDIRECT 220.5 12/15/2011   TRIG 87.0 05/08/2021   CHOLHDL 4 05/08/2021    Routine general medical examination at a health care facility We discussed the importance of regular low impact physical activity and healthy diet for prevention of chronic illness and/or complications.  Preventive guidelines reviewed. Vaccination up to date. Next CPE in a  year.  Stage 3a chronic kidney disease (HCC) Stable. Following with nephrologist.  Hyperlipidemia, unspecified hyperlipidemia type We discussed recommendations and CVD prevention. He is not interested in pharmacologic treatment. Continue low fat diet.  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 Continue Amlodipine 10 mg daily. Low salt diet. Continue monitoring BP regularly.  Paget's disease of the bone We discussed Dx.prognosis,and treatment options as well as some side effects of medications used for this problem. He is not interested in pharmacologic treatment nor on endocrinologist consultation. Fall precautions discussed.  We discussed other possible causes of elevated alk phosphatase and bone lesions, including prostate cancer. He is not interested in further workup.  Instructed to monitor for new onset of back/bone pain.   Return in 1 year (on 05/08/2022) for CPE and f/u.   Miyah Hampshire G. [place], MD  [facility]. [place] office.  A few things to remember from today's visit:   Routine general medical examination at a health care facility  Stage 3a chronic kidney disease ([place])  Hyperlipidemia, unspecified hyperlipidemia type - Plan: Lipid panel, Hepatic function panel  Hypertensive kidney disease with stage 3a chronic kidney disease ([place]) - Plan: TSH  Paget's disease of the bone - Plan: Hepatic function panel, Alkaline phosphatase, bone specific  If you need refills please call your pharmacy. Do not use My Chart to request refills or for acute issues that need immediate attention.   Please be sure medication list is accurate. If a new problem present, please set up appointment sooner than planned today.   Paget's Disease of Bone Paget's disease is a condition that makes the bones grow  faster than normal. Healthy bones rebuild themselves by breaking down old bone and replacing it with new bone on a  regular basis. This process is called bone turnover or remodeling and normally slows down with age. In Paget's disease, new bone is formed faster than the old bone can be removed. This may cause bones to: Be larger and weaker than normal. Have abnormal shapes (deformities). Break more easily than healthy bones. Paget's disease may affect just a few bones, or it may affect bones all over the body. Bones in the arms, legs, spine, pelvis, and skull are often affected. What are the causes? The cause of this condition is not known. What increases the risk? The following factors may make you more likely to develop this condition: Having a family history of the disease. Being 33 years of age or older. Being male. Being of European descent. What are the signs or symptoms? Symptoms of this condition may include: Bone pain, neck pain, and headache. Joint pain or stiffness. Tingling or numbness. Legs that bend outward from the hips to the ankles (bowed legs). Bones that break easily. A feeling of warmth in areas of skin that are over the affected bone. Loss of height. You may also have changes in the shape of the skull or other bones. Hearing loss may occur if the skull bones are affected. In some cases, there are no symptoms of this condition. How is this diagnosed? This condition may be diagnosed based on: A physical exam. Your medical history. Blood and urine tests. Imaging tests, such as X-rays and bone scans. Removal and testing of a bone sample (bone biopsy). This is rarely needed. How is this treated? Treatment for this condition depends on your symptoms and which areas of your body are affected. If you have no symptoms, you may not need treatment. However, you may need treatment if the condition is causing symptoms, affecting your skull, or putting you at risk for broken bones (fractures). The goals of treatment are to relieve bone pain and to prevent the condition from getting worse.  Treatment may include: Medicines for pain. This may include NSAIDs, such as ibuprofen. Medicines that slow down abnormal bone turnover (bisphosphonates or calcitonin). Calcium and vitamin D supplements. Devices to help you move around. These are called assistive devices or mobility aids. These may include a cane, walker, or brace. Surgery to treat problems that are caused by the disease. Surgery is rarely used. Follow these instructions at home: Medicines Take over-the-counter and prescription medicines only as told by your health care provider. Take calcium and vitamin D supplements as told by your health care provider. If you are taking a bisphosphonate by mouth: Take the medicine with a large glass of water. Take it in the morning. Do not eat or drink anything for 30 minutes after taking the medicine. Stay upright for 30 minutes after taking the medicine. Avoid lying down during this time. Preventing falls and fractures This condition may put you at greater risk for falls and fractures. To help prevent falls: Use a cane or walker as needed. Wear closed-toe shoes that fit well and support your feet. Wear shoes that have rubber soles or low heels. Remove clutter and tripping hazards from all walkways. Use good lighting in all rooms. Use a night-light to help you see during the night. Install and use handrails on stairways and in the bathroom. Have your eyes checked regularly. If you wear glasses, wear them at all times. Ask your health care provider  what activities are safe for you. You may need to avoid activities that put you at risk for falls or injuries, such as contact sports.   General instructions Wear braces as told by your health care provider. You may need to have regular blood tests to monitor your treatment. Keep all follow-up visits. This is important.   Contact a health care provider if: You have bone pain or joint pain that gets worse. You have hearing loss. You have  swelling in your legs or ankles. You have abdominal pain. You lose your appetite. You are not able to have a bowel movement. You have fatigue or weakness. Get help right away if: You think that you might have a broken bone. You fall. You have chest pain. You have shortness of breath. You have numbness or are not able to move your arms or legs. Summary Paget's disease is a condition that makes the bones grow faster than normal. This causes bones to be larger and weaker than normal, have abnormal shapes (deformities), and break more easily than healthy bones. This condition is more likely to develop in people who have a family history of the disease, are 20 or older, are male, or are of European descent. The goals of treatment are to relieve bone pain and to prevent the condition from getting worse. Treatment may include pain medicine, medicines that slow down abnormal bone turnover (bisphosphonates or calcitonin), and in rare cases, surgery. Keep all follow-up visits. This is important. You may need to have regular blood tests to monitor your treatment. This information is not intended to replace advice given to you by your health care provider. Make sure you discuss any questions you have with your health care provider. Document Revised: 05/03/2020 Document Reviewed: 05/03/2020 Elsevier Patient Education  [place].

## 2021-05-08 ENCOUNTER — Encounter: Payer: Self-pay | Admitting: Family Medicine

## 2021-05-08 ENCOUNTER — Other Ambulatory Visit: Payer: Self-pay

## 2021-05-08 ENCOUNTER — Ambulatory Visit (INDEPENDENT_AMBULATORY_CARE_PROVIDER_SITE_OTHER): Payer: Medicare Other | Admitting: Family Medicine

## 2021-05-08 VITALS — BP 136/80 | HR 92 | Resp 12 | Ht 68.0 in | Wt 148.0 lb

## 2021-05-08 DIAGNOSIS — I129 Hypertensive chronic kidney disease with stage 1 through stage 4 chronic kidney disease, or unspecified chronic kidney disease: Secondary | ICD-10-CM | POA: Diagnosis not present

## 2021-05-08 DIAGNOSIS — Z0000 Encounter for general adult medical examination without abnormal findings: Secondary | ICD-10-CM

## 2021-05-08 DIAGNOSIS — M889 Osteitis deformans of unspecified bone: Secondary | ICD-10-CM

## 2021-05-08 DIAGNOSIS — E785 Hyperlipidemia, unspecified: Secondary | ICD-10-CM | POA: Diagnosis not present

## 2021-05-08 DIAGNOSIS — N1831 Hypertensive chronic kidney disease with stage 1 through stage 4 chronic kidney disease, or unspecified chronic kidney disease: Secondary | ICD-10-CM

## 2021-05-08 LAB — HEPATIC FUNCTION PANEL
ALT: 13 U/L (ref 0–53)
AST: 18 U/L (ref 0–37)
Albumin: 4.4 g/dL (ref 3.5–5.2)
Alkaline Phosphatase: 189 U/L — ABNORMAL HIGH (ref 39–117)
Bilirubin, Direct: 0.1 mg/dL (ref 0.0–0.3)
Total Bilirubin: 0.4 mg/dL (ref 0.2–1.2)
Total Protein: 7.5 g/dL (ref 6.0–8.3)

## 2021-05-08 LAB — LIPID PANEL
Cholesterol: 259 mg/dL — ABNORMAL HIGH (ref 0–200)
HDL: 59.4 mg/dL (ref >39.00)
LDL Cholesterol: 182 mg/dL — ABNORMAL HIGH (ref 0–99)
NonHDL: 199.77
Total CHOL/HDL Ratio: 4
Triglycerides: 87 mg/dL (ref 0.0–149.0)
VLDL: 17.4 mg/dL (ref 0.0–40.0)

## 2021-05-08 LAB — TSH: TSH: 6.37 u[IU]/mL — ABNORMAL HIGH (ref 0.35–4.50)

## 2021-05-08 NOTE — Patient Instructions (Addendum)
A few things to remember from today's visit:   Routine general medical examination at a health care facility  Stage 3a chronic kidney disease (HCC)  Hyperlipidemia, unspecified hyperlipidemia type - Plan: Lipid panel, Hepatic function panel  Hypertensive kidney disease with stage 3a chronic kidney disease (HCC) - Plan: TSH  Paget's disease of the bone - Plan: Hepatic function panel, Alkaline phosphatase, bone specific  If you need refills please call your pharmacy. Do not use My Chart to request refills or for acute issues that need immediate attention.   Please be sure medication list is accurate. If a new problem present, please set up appointment sooner than planned today.   Paget's Disease of Bone Paget's disease is a condition that makes the bones grow faster than normal. Healthy bones rebuild themselves by breaking down old bone and replacing it with new bone on a regular basis. This process is called bone turnover or remodeling and normally slows down with age. In Paget's disease, new bone is formed faster than the old bone can be removed. This may cause bones to:  Be larger and weaker than normal.  Have abnormal shapes (deformities).  Break more easily than healthy bones. Paget's disease may affect just a few bones, or it may affect bones all over the body. Bones in the arms, legs, spine, pelvis, and skull are often affected. What are the causes? The cause of this condition is not known. What increases the risk? The following factors may make you more likely to develop this condition:  Having a family history of the disease.  Being 13 years of age or older.  Being male.  Being of European descent. What are the signs or symptoms? Symptoms of this condition may include:  Bone pain, neck pain, and headache.  Joint pain or stiffness.  Tingling or numbness.  Legs that bend outward from the hips to the ankles (bowed legs).  Bones that break easily.  A feeling  of warmth in areas of skin that are over the affected bone.  Loss of height. You may also have changes in the shape of the skull or other bones. Hearing loss may occur if the skull bones are affected. In some cases, there are no symptoms of this condition. How is this diagnosed? This condition may be diagnosed based on:  A physical exam.  Your medical history.  Blood and urine tests.  Imaging tests, such as X-rays and bone scans.  Removal and testing of a bone sample (bone biopsy). This is rarely needed. How is this treated? Treatment for this condition depends on your symptoms and which areas of your body are affected. If you have no symptoms, you may not need treatment. However, you may need treatment if the condition is causing symptoms, affecting your skull, or putting you at risk for broken bones (fractures). The goals of treatment are to relieve bone pain and to prevent the condition from getting worse. Treatment may include:  Medicines for pain. This may include NSAIDs, such as ibuprofen.  Medicines that slow down abnormal bone turnover (bisphosphonates or calcitonin).  Calcium and vitamin D supplements.  Devices to help you move around. These are called assistive devices or mobility aids. These may include a cane, walker, or brace.  Surgery to treat problems that are caused by the disease. Surgery is rarely used. Follow these instructions at home: Medicines  Take over-the-counter and prescription medicines only as told by your health care provider.  Take calcium and vitamin D supplements as  told by your health care provider.  If you are taking a bisphosphonate by mouth: ? Take the medicine with a large glass of water. ? Take it in the morning. ? Do not eat or drink anything for 30 minutes after taking the medicine. ? Stay upright for 30 minutes after taking the medicine. Avoid lying down during this time. Preventing falls and fractures  This condition may put you at  greater risk for falls and fractures. To help prevent falls: ? Use a cane or walker as needed. ? Wear closed-toe shoes that fit well and support your feet. Wear shoes that have rubber soles or low heels. ? Remove clutter and tripping hazards from all walkways. ? Use good lighting in all rooms. Use a night-light to help you see during the night. ? Install and use handrails on stairways and in the bathroom. ? Have your eyes checked regularly. If you wear glasses, wear them at all times.  Ask your health care provider what activities are safe for you. You may need to avoid activities that put you at risk for falls or injuries, such as contact sports.   General instructions  Wear braces as told by your health care provider.  You may need to have regular blood tests to monitor your treatment.  Keep all follow-up visits. This is important.   Contact a health care provider if:  You have bone pain or joint pain that gets worse.  You have hearing loss.  You have swelling in your legs or ankles.  You have abdominal pain.  You lose your appetite.  You are not able to have a bowel movement.  You have fatigue or weakness. Get help right away if:  You think that you might have a broken bone.  You fall.  You have chest pain.  You have shortness of breath.  You have numbness or are not able to move your arms or legs. Summary  Paget's disease is a condition that makes the bones grow faster than normal. This causes bones to be larger and weaker than normal, have abnormal shapes (deformities), and break more easily than healthy bones.  This condition is more likely to develop in people who have a family history of the disease, are 35 or older, are male, or are of European descent.  The goals of treatment are to relieve bone pain and to prevent the condition from getting worse. Treatment may include pain medicine, medicines that slow down abnormal bone turnover (bisphosphonates or  calcitonin), and in rare cases, surgery.  Keep all follow-up visits. This is important. You may need to have regular blood tests to monitor your treatment. This information is not intended to replace advice given to you by your health care provider. Make sure you discuss any questions you have with your health care provider. Document Revised: 05/03/2020 Document Reviewed: 05/03/2020 Elsevier Patient Education  2021 [employer].

## 2021-05-10 LAB — ALKALINE PHOSPHATASE, BONE SPECIFIC: ALKALINE PHOSPHATASE, BONE SPECIFIC: 56.8 mcg/L

## 2021-05-12 ENCOUNTER — Other Ambulatory Visit: Payer: Self-pay | Admitting: Family Medicine

## 2021-05-12 ENCOUNTER — Encounter: Payer: Self-pay | Admitting: Family Medicine

## 2021-05-12 DIAGNOSIS — I10 Essential (primary) hypertension: Secondary | ICD-10-CM

## 2021-05-13 ENCOUNTER — Other Ambulatory Visit: Payer: Self-pay

## 2021-05-13 DIAGNOSIS — R7989 Other specified abnormal findings of blood chemistry: Secondary | ICD-10-CM

## 2021-05-13 DIAGNOSIS — R946 Abnormal results of thyroid function studies: Secondary | ICD-10-CM

## 2021-07-02 ENCOUNTER — Other Ambulatory Visit: Payer: Self-pay

## 2021-07-02 ENCOUNTER — Ambulatory Visit (INDEPENDENT_AMBULATORY_CARE_PROVIDER_SITE_OTHER): Payer: Medicare Other

## 2021-07-02 VITALS — BP 130/70 | HR 87 | Temp 98.2°F | Wt 149.4 lb

## 2021-07-02 DIAGNOSIS — Z0000 Encounter for general adult medical examination without abnormal findings: Secondary | ICD-10-CM

## 2021-07-02 NOTE — Patient Instructions (Signed)
Douglas Matthews , Thank you for taking time to come for your Medicare Wellness Visit. I appreciate your ongoing commitment to your health goals. Please review the following plan we discussed and let me know if I can assist you in the future.   Screening recommendations/referrals: Colonoscopy: No longer required  Recommended yearly ophthalmology/optometry visit for glaucoma screening and checkup Recommended yearly dental visit for hygiene and checkup  Vaccinations: Influenza vaccine: Due and discussed Pneumococcal vaccine: Completed  Tdap vaccine: Done 07/16/18 repeat in 10 years 07/16/28 Shingles vaccine: Completed 04/13/18 & 09/01/18   Covid-19: Completed 1/22, 2/21, 09/03/20 & 04/12/21  Advanced directives: Please bring a copy of your health care power of attorney and living will to the office at your convenience.  Conditions/risks identified: Stay healthy  Next appointment: Follow up in one year for your annual wellness visit.   Preventive Care 70 Years and Older, Male Preventive care refers to lifestyle choices and visits with your health care provider that can promote health and wellness. What does preventive care include? A yearly physical exam. This is also called an annual well check. Dental exams once or twice a year. Routine eye exams. Ask your health care provider how often you should have your eyes checked. Personal lifestyle choices, including: Daily care of your teeth and gums. Regular physical activity. Eating a healthy diet. Avoiding tobacco and drug use. Limiting alcohol use. Practicing safe sex. Taking low doses of aspirin every day. Taking vitamin and mineral supplements as recommended by your health care provider. What happens during an annual well check? The services and screenings done by your health care provider during your annual well check will depend on your age, overall health, lifestyle risk factors, and family history of disease. Counseling  Your health  care provider may ask you questions about your: Alcohol use. Tobacco use. Drug use. Emotional well-being. Home and relationship well-being. Sexual activity. Eating habits. History of falls. Memory and ability to understand (cognition). Work and work Astronomer. Screening  You may have the following tests or measurements: Height, weight, and BMI. Blood pressure. Lipid and cholesterol levels. These may be checked every 5 years, or more frequently if you are over 23 years old. Skin check. Lung cancer screening. You may have this screening every year starting at age 59 if you have a 30-pack-year history of smoking and currently smoke or have quit within the past 15 years. Fecal occult blood test (FOBT) of the stool. You may have this test every year starting at age 67. Flexible sigmoidoscopy or colonoscopy. You may have a sigmoidoscopy every 5 years or a colonoscopy every 10 years starting at age 24. Prostate cancer screening. Recommendations will vary depending on your family history and other risks. Hepatitis C blood test. Hepatitis B blood test. Sexually transmitted disease (STD) testing. Diabetes screening. This is done by checking your blood sugar (glucose) after you have not eaten for a while (fasting). You may have this done every 1-3 years. Abdominal aortic aneurysm (AAA) screening. You may need this if you are a current or former smoker. Osteoporosis. You may be screened starting at age 14 if you are at high risk. Talk with your health care provider about your test results, treatment options, and if necessary, the need for more tests. Vaccines  Your health care provider may recommend certain vaccines, such as: Influenza vaccine. This is recommended every year. Tetanus, diphtheria, and acellular pertussis (Tdap, Td) vaccine. You may need a Td booster every 10 years. Zoster vaccine. You may  need this after age 38. Pneumococcal 13-valent conjugate (PCV13) vaccine. One dose is  recommended after age 8. Pneumococcal polysaccharide (PPSV23) vaccine. One dose is recommended after age 24. Talk to your health care provider about which screenings and vaccines you need and how often you need them. This information is not intended to replace advice given to you by your health care provider. Make sure you discuss any questions you have with your health care provider. Document Released: 12/14/2015 Document Revised: 08/06/2016 Document Reviewed: 09/18/2015 Elsevier Interactive Patient Education  2017 [employer] Prevention in the Home Falls can cause injuries. They can happen to people of all ages. There are many things you can do to make your home safe and to help prevent falls. What can I do on the outside of my home? Regularly fix the edges of walkways and driveways and fix any cracks. Remove anything that might make you trip as you walk through a door, such as a raised step or threshold. Trim any bushes or trees on the path to your home. Use bright outdoor lighting. Clear any walking paths of anything that might make someone trip, such as rocks or tools. Regularly check to see if handrails are loose or broken. Make sure that both sides of any steps have handrails. Any raised decks and porches should have guardrails on the edges. Have any leaves, snow, or ice cleared regularly. Use sand or salt on walking paths during winter. Clean up any spills in your garage right away. This includes oil or grease spills. What can I do in the bathroom? Use night lights. Install grab bars by the toilet and in the tub and shower. Do not use towel bars as grab bars. Use non-skid mats or decals in the tub or shower. If you need to sit down in the shower, use a plastic, non-slip stool. Keep the floor dry. Clean up any water that spills on the floor as soon as it happens. Remove soap buildup in the tub or shower regularly. Attach bath mats securely with double-sided non-slip rug  tape. Do not have throw rugs and other things on the floor that can make you trip. What can I do in the bedroom? Use night lights. Make sure that you have a light by your bed that is easy to reach. Do not use any sheets or blankets that are too big for your bed. They should not hang down onto the floor. Have a firm chair that has side arms. You can use this for support while you get dressed. Do not have throw rugs and other things on the floor that can make you trip. What can I do in the kitchen? Clean up any spills right away. Avoid walking on wet floors. Keep items that you use a lot in easy-to-reach places. If you need to reach something above you, use a strong step stool that has a grab bar. Keep electrical cords out of the way. Do not use floor polish or wax that makes floors slippery. If you must use wax, use non-skid floor wax. Do not have throw rugs and other things on the floor that can make you trip. What can I do with my stairs? Do not leave any items on the stairs. Make sure that there are handrails on both sides of the stairs and use them. Fix handrails that are broken or loose. Make sure that handrails are as long as the stairways. Check any carpeting to make sure that it is firmly attached  to the stairs. Fix any carpet that is loose or worn. Avoid having throw rugs at the top or bottom of the stairs. If you do have throw rugs, attach them to the floor with carpet tape. Make sure that you have a light switch at the top of the stairs and the bottom of the stairs. If you do not have them, ask someone to add them for you. What else can I do to help prevent falls? Wear shoes that: Do not have high heels. Have rubber bottoms. Are comfortable and fit you well. Are closed at the toe. Do not wear sandals. If you use a stepladder: Make sure that it is fully opened. Do not climb a closed stepladder. Make sure that both sides of the stepladder are locked into place. Ask someone to  hold it for you, if possible. Clearly mark and make sure that you can see: Any grab bars or handrails. First and last steps. Where the edge of each step is. Use tools that help you move around (mobility aids) if they are needed. These include: Canes. Walkers. Scooters. Crutches. Turn on the lights when you go into a dark area. Replace any light bulbs as soon as they burn out. Set up your furniture so you have a clear path. Avoid moving your furniture around. If any of your floors are uneven, fix them. If there are any pets around you, be aware of where they are. Review your medicines with your doctor. Some medicines can make you feel dizzy. This can increase your chance of falling. Ask your doctor what other things that you can do to help prevent falls. This information is not intended to replace advice given to you by your health care provider. Make sure you discuss any questions you have with your health care provider. Document Released: 09/13/2009 Document Revised: 04/24/2016 Document Reviewed: 12/22/2014 Elsevier Interactive Patient Education  2017 [employer].

## 2021-07-02 NOTE — Progress Notes (Addendum)
Subjective:   Douglas Matthews is a 86 y.o. male who presents for Medicare Annual/Subsequent preventive examination.  Review of Systems     Cardiac Risk Factors include: advanced age (>15men, >85 women);hypertension;dyslipidemia;male gender     Objective:    Today's Vitals   07/02/21 1432  BP: 130/70  Pulse: 87  Temp: 98.2 F (36.8 C)  SpO2: 98%  Weight: 149 lb 6.4 oz (67.8 kg)   Body mass index is 22.72 kg/m.  Advanced Directives 07/02/2021 06/26/2020 04/17/2017  Does Patient Have a Medical Advance Directive? Yes Yes Yes  Type of Advance Directive Living will Healthcare Power of [place];Living will -  Copy of Healthcare Power of Attorney in Chart? - No - copy requested -    Current Medications (verified) Outpatient Encounter Medications as of 07/02/2021  Medication Sig   amLODipine (NORVASC) 10 MG tablet TAKE 1 TABLET BY MOUTH EVERY DAY IN THE MORNING   COD LIVER OIL PO Take by mouth.   GARLIC PO Take by mouth.   Multiple Vitamin (MULTIVITAMIN) capsule Take 1 capsule by mouth daily. One a day   saw palmetto 500 MG capsule Take 500 mg by mouth daily.   [DISCONTINUED] aspirin 81 MG tablet Take 81 mg by mouth daily.  (Patient not taking: Reported on 07/02/2021)   No facility-administered encounter medications on file as of 07/02/2021.    Allergies (verified) Patient has no known allergies.   History: Past Medical History:  Diagnosis Date   Anemia    BPH (benign prostatic hypertrophy)    Diverticulosis    Hyperlipidemia    Hypertension    Past Surgical History:  Procedure Laterality Date   APPENDECTOMY     HERNIA REPAIR     Family History  Problem Relation Age of Onset   Heart disease Other    Social History   Socioeconomic History   Marital status: Married    Spouse name: Not on file   Number of children: Not on file   Years of education: Not on file   Highest education level: Not on file  Occupational History   Occupation: Retired  Tobacco Use    Smoking status: Never   Smokeless tobacco: Never  Substance and Sexual Activity   Alcohol use: No   Drug use: No   Sexual activity: Not on file  Other Topics Concern   Not on file  Social History Narrative   Married x 50 years    Social Determinants of Health   Financial Resource Strain: Low Risk    Difficulty of Paying Living Expenses: Not hard at all  Food Insecurity: No Food Insecurity   Worried About Programme researcher, broadcasting/film/video in the Last Year: Never true   Barista in the Last Year: Never true  Transportation Needs: No Transportation Needs   Lack of Transportation (Medical): No   Lack of Transportation (Non-Medical): No  Physical Activity: Insufficiently Active   Days of Exercise per Week: 2 days   Minutes of Exercise per Session: 30 min  Stress: No Stress Concern Present   Feeling of Stress : Not at all  Social Connections: Moderately Integrated   Frequency of Communication with Friends and Family: Three times a week   Frequency of Social Gatherings with Friends and Family: Three times a week   Attends Religious Services: More than 4 times per year   Active Member of Clubs or Organizations: No   Attends Banker Meetings: Never   Marital Status: Married  Tobacco Counseling Counseling given: Not Answered   Clinical Intake:  Pre-visit preparation completed: Yes  Pain : No/denies pain     BMI - recorded: 22.72 Nutritional Status: BMI of 19-24  Normal Nutritional Risks: None Diabetes: No  How often do you need to have someone help you when you read instructions, pamphlets, or other written materials from your doctor or pharmacy?: 1 - Never  Diabetic?No  Interpreter Needed?: No  Information entered by :: Lanier Ensign, LPN   Activities of Daily Living In your present state of health, do you have any difficulty performing the following activities: 07/02/2021  Hearing? N  Vision? N  Difficulty concentrating or making decisions? N  Walking  or climbing stairs? N  Dressing or bathing? N  Doing errands, shopping? N  Preparing Food and eating ? N  Using the Toilet? N  In the past six months, have you accidently leaked urine? N  Do you have problems with loss of bowel control? N  Managing your Medications? N  Managing your Finances? N  Housekeeping or managing your Housekeeping? N  Some recent data might be hidden    Patient Care Team: [place], [name], MD as PCP - General (Family Medicine)  Indicate any recent Medical Services you may have received from other than [facility] providers in the past year (date may be approximate).     Assessment:   This is a routine wellness examination for Douglas Matthews.  Hearing/Vision screen Hearing Screening - Comments:: Pt denies any hearing issues  Vision Screening - Comments:: Pt follows up with Dr Sherrine Maples for annual eye exams  Dietary issues and exercise activities discussed: Current Exercise Habits: Structured exercise class, Type of exercise: Other - see comments, Time (Minutes): 30, Frequency (Times/Week): 2, Weekly Exercise (Minutes/Week): 60   Goals Addressed             This Visit's Progress    Patient Stated       Stay healthy       Depression Screen PHQ 2/9 Scores 07/02/2021 05/08/2021 06/26/2020 04/16/2020 04/29/2019 04/19/2018 04/17/2017  PHQ - 2 Score 0 0 0 0 0 0 0    Fall Risk Fall Risk  07/02/2021 05/08/2021 06/26/2020 05/18/2020 04/29/2019  Falls in the past year? 0 0 0 0 0  Number falls in past yr: 0 0 0 0 0  Injury with Fall? 0 0 0 0 0  Risk for fall due to : Impaired vision - Impaired vision - Impaired balance/gait  Follow up Falls prevention discussed Education provided Falls prevention discussed - Education provided    FALL RISK PREVENTION PERTAINING TO THE HOME:  Any stairs in or around the home? Yes  If so, are there any without handrails? No  Home free of loose throw rugs in walkways, pet beds, electrical cords, etc? Yes  Adequate lighting in your home to reduce risk  of falls? Yes   ASSISTIVE DEVICES UTILIZED TO PREVENT FALLS:  Life alert? No  Use of a cane, walker or w/c? No  Grab bars in the bathroom? Yes  Shower chair or bench in shower? Yes  Elevated toilet seat or a handicapped toilet? No   TIMED UP AND GO:  Was the test performed? Yes .  Length of time to ambulate 10 feet: 10 sec.   Gait steady and fast without use of assistive device  Cognitive Function: MMSE - Mini Mental State Exam 04/17/2017  Not completed: (No Data)     6CIT Screen 07/02/2021 06/26/2020  What Year?  0 points 0 points  What month? 0 points 3 points  What time? 0 points 0 points  Count back from 20 0 points 0 points  Months in reverse 0 points 0 points  Repeat phrase 2 points 0 points  Total Score 2 3    Immunizations Immunization History  Administered Date(s) Administered   Influenza Split 12/15/2011   Influenza Whole 09/01/2003, 09/19/2010   Influenza, High Dose Seasonal PF 09/22/2017   Influenza-Unspecified 08/31/2014   PFIZER(Purple Top)SARS-COV-2 Vaccination 12/23/2019, 01/22/2020, 09/03/2020, 04/12/2021   Pneumococcal Conjugate-13 01/09/2014   Pneumococcal Polysaccharide-23 12/01/2001, 06/13/2008   Td 12/01/1996, 06/13/2008   Tdap 07/16/2018   Zoster Recombinat (Shingrix) 04/13/2018, 09/01/2018    TDAP status: Up to date  Flu Vaccine status: Due, Education has been provided regarding the importance of this vaccine. Advised may receive this vaccine at local pharmacy or Health Dept. Aware to provide a copy of the vaccination record if obtained from local pharmacy or Health Dept. Verbalized acceptance and understanding.  Pneumococcal vaccine status: Up to date  Covid-19 vaccine status: Completed vaccines  Qualifies for Shingles Vaccine? Yes   Zostavax completed Yes   Shingrix Completed?: Yes  Screening Tests Health Maintenance  Topic Date Due   INFLUENZA VACCINE  07/01/2021   TETANUS/TDAP  07/16/2028   COVID-19 Vaccine  Completed   PNA vac  Low Risk Adult  Completed   Zoster Vaccines- Shingrix  Completed   HPV VACCINES  Aged Out    Health Maintenance  Health Maintenance Due  Topic Date Due   INFLUENZA VACCINE  07/01/2021    Colorectal cancer screening: No longer required.     Additional Screening:   Vision Screening: Recommended annual ophthalmology exams for early detection of glaucoma and other disorders of the eye. Is the patient up to date with their annual eye exam?  Yes  Who is the provider or what is the name of the office in which the patient attends annual eye exams? Dr Sherrine Maples If pt is not established with a provider, would they like to be referred to a provider to establish care? No .   Dental Screening: Recommended annual dental exams for proper oral hygiene  Community Resource Referral / Chronic Care Management: CRR required this visit?  No   CCM required this visit?  No      Plan:     I have personally reviewed and noted the following in the patient's chart:   Medical and social history Use of alcohol, tobacco or illicit drugs  Current medications and supplements including opioid prescriptions. Patient is not currently taking opioid prescriptions. Functional ability and status Nutritional status Physical activity Advanced directives List of other physicians Hospitalizations, surgeries, and ER visits in previous 12 months Vitals Screenings to include cognitive, depression, and falls Referrals and appointments  In addition, I have reviewed and discussed with patient certain preventive protocols, quality metrics, and best practice recommendations. A written personalized care plan for preventive services as well as general preventive health recommendations were provided to patient.     Marzella Schlein, LPN   08/03/5700   Nurse Notes: None

## 2021-08-01 DIAGNOSIS — E875 Hyperkalemia: Secondary | ICD-10-CM | POA: Diagnosis not present

## 2021-08-01 DIAGNOSIS — E785 Hyperlipidemia, unspecified: Secondary | ICD-10-CM | POA: Diagnosis not present

## 2021-08-01 DIAGNOSIS — N2581 Secondary hyperparathyroidism of renal origin: Secondary | ICD-10-CM | POA: Diagnosis not present

## 2021-08-01 DIAGNOSIS — N1832 Chronic kidney disease, stage 3b: Secondary | ICD-10-CM | POA: Diagnosis not present

## 2021-08-01 DIAGNOSIS — Z9114 Patient's other noncompliance with medication regimen: Secondary | ICD-10-CM | POA: Diagnosis not present

## 2021-08-01 DIAGNOSIS — D638 Anemia in other chronic diseases classified elsewhere: Secondary | ICD-10-CM | POA: Diagnosis not present

## 2021-08-01 DIAGNOSIS — I129 Hypertensive chronic kidney disease with stage 1 through stage 4 chronic kidney disease, or unspecified chronic kidney disease: Secondary | ICD-10-CM | POA: Diagnosis not present

## 2021-08-13 ENCOUNTER — Other Ambulatory Visit: Payer: Medicare Other

## 2021-08-26 ENCOUNTER — Other Ambulatory Visit (INDEPENDENT_AMBULATORY_CARE_PROVIDER_SITE_OTHER): Payer: Medicare Other

## 2021-08-26 ENCOUNTER — Other Ambulatory Visit: Payer: Self-pay

## 2021-08-26 DIAGNOSIS — R7989 Other specified abnormal findings of blood chemistry: Secondary | ICD-10-CM | POA: Diagnosis not present

## 2021-08-26 DIAGNOSIS — R946 Abnormal results of thyroid function studies: Secondary | ICD-10-CM

## 2021-08-26 LAB — TSH: TSH: 5.91 u[IU]/mL — ABNORMAL HIGH (ref 0.35–5.50)

## 2021-09-12 DIAGNOSIS — Z23 Encounter for immunization: Secondary | ICD-10-CM | POA: Diagnosis not present

## 2021-11-12 ENCOUNTER — Other Ambulatory Visit: Payer: Self-pay

## 2021-11-12 MED ORDER — AMLODIPINE BESYLATE 5 MG PO TABS: 10.0000 mg | ORAL_TABLET | Freq: Every day | ORAL | 1 refills | Status: DC

## 2021-12-11 ENCOUNTER — Other Ambulatory Visit: Payer: Self-pay

## 2021-12-11 ENCOUNTER — Encounter: Payer: Self-pay | Admitting: Family Medicine

## 2021-12-11 ENCOUNTER — Ambulatory Visit (INDEPENDENT_AMBULATORY_CARE_PROVIDER_SITE_OTHER): Payer: Medicare Other

## 2021-12-11 ENCOUNTER — Ambulatory Visit (INDEPENDENT_AMBULATORY_CARE_PROVIDER_SITE_OTHER): Payer: Medicare Other | Admitting: Family Medicine

## 2021-12-11 VITALS — BP 128/70 | HR 100 | Resp 16 | Ht 68.0 in | Wt 148.0 lb

## 2021-12-11 DIAGNOSIS — M889 Osteitis deformans of unspecified bone: Secondary | ICD-10-CM

## 2021-12-11 DIAGNOSIS — N1832 Chronic kidney disease, stage 3b: Secondary | ICD-10-CM

## 2021-12-11 DIAGNOSIS — M25511 Pain in right shoulder: Secondary | ICD-10-CM

## 2021-12-11 DIAGNOSIS — M19011 Primary osteoarthritis, right shoulder: Secondary | ICD-10-CM | POA: Diagnosis not present

## 2021-12-11 MED ORDER — LIDOCAINE 3 % EX CREA: 1.0000 "application " | TOPICAL_CREAM | Freq: Every day | CUTANEOUS | 0 refills | Status: AC

## 2021-12-11 NOTE — Patient Instructions (Signed)
A few things to remember from today's visit:  Acute pain of right shoulder - Plan: DG Shoulder Right Not sure what is causing shoulder pain. Monitor for rash and new symptoms. Apply topical lidocaine at bedtime.  If you need refills please call your pharmacy. Do not use My Chart to request refills or for acute issues that need immediate attention.    Please be sure medication list is accurate. If a new problem present, please set up appointment sooner than planned today.

## 2021-12-11 NOTE — Progress Notes (Signed)
ACUTE VISIT Chief Complaint  Patient presents with   Shoulder Pain    X 7 days, mainly at night time.    HPI: Douglas Matthews is a 87 y.o. male, who is here today complaining of right shoulder pain as described above.  Intermittent burning like pain that happens when lying on his back. Shoulder Pain  The pain is present in the right shoulder. This is a new problem. The current episode started in the past 7 days. There has been no history of extremity trauma. The problem has been unchanged. The pain is at a severity of 5/10. Associated symptoms include stiffness. Pertinent negatives include no fever, inability to bear weight, itching, joint locking, joint swelling, limited range of motion, numbness or tingling. The symptoms are aggravated by lying down. He has tried nothing for the symptoms. Family history does not include gout or rheumatoid arthritis. There is no history of diabetes, gout, osteoarthritis or rheumatoid arthritis.  No hx of trauma. He has not noted cough,CP,or skin rash.  Hx of elevated alk phosphatase, lesions suggestive of paget's disease seen on pelvic X ray in 05/2020, as well as osteoporosis. He has not had back pain. He has refused treatment.  CKD IIIb, follows with nephrologist q 6 months, last seen 08/2021. Phosphorus and calcium have been in normal range.  Review of Systems  Constitutional:  Negative for activity change, appetite change and fever.  Cardiovascular:  Negative for palpitations and leg swelling.  Genitourinary:  Negative for decreased urine volume and hematuria.  Musculoskeletal:  Positive for stiffness. Negative for gout.  Skin:  Negative for itching.  Neurological:  Negative for tingling, syncope, weakness and numbness.  Rest see pertinent positives and negatives per HPI.  Current Outpatient Medications on File Prior to Visit  Medication Sig Dispense Refill   amLODipine (NORVASC) 10 MG tablet Take 10 mg by mouth daily.     COD LIVER OIL PO  Take by mouth.     GARLIC PO Take by mouth.     Multiple Vitamin (MULTIVITAMIN) capsule Take 1 capsule by mouth daily. One a day     saw palmetto 500 MG capsule Take 500 mg by mouth daily.     No current facility-administered medications on file prior to visit.     Past Medical History:  Diagnosis Date   Anemia    BPH (benign prostatic hypertrophy)    Diverticulosis    Hyperlipidemia    Hypertension    No Known Allergies  Social History   Socioeconomic History   Marital status: Married    Spouse name: Not on file   Number of children: Not on file   Years of education: Not on file   Highest education level: Not on file  Occupational History   Occupation: Retired  Tobacco Use   Smoking status: Never   Smokeless tobacco: Never  Substance and Sexual Activity   Alcohol use: No   Drug use: No   Sexual activity: Not on file  Other Topics Concern   Not on file  Social History Narrative   Married x 50 years    Social Determinants of Health   Financial Resource Strain: Low Risk    Difficulty of Paying Living Expenses: Not hard at all  Food Insecurity: No Food Insecurity   Worried About Charity fundraiser in the Last Year: Never true   [place] in the Last Year: Never true  Transportation Needs: No Transportation Needs   Lack of  Transportation (Medical): No   Lack of Transportation (Non-Medical): No  Physical Activity: Insufficiently Active   Days of Exercise per Week: 2 days   Minutes of Exercise per Session: 30 min  Stress: No Stress Concern Present   Feeling of Stress : Not at all  Social Connections: Moderately Integrated   Frequency of Communication with Friends and Family: Three times a week   Frequency of Social Gatherings with Friends and Family: Three times a week   Attends Religious Services: More than 4 times per year   Active Member of Clubs or Organizations: No   Attends Archivist Meetings: Never   Marital Status: Married   Vitals:    12/11/21 0958  BP: 128/70  Pulse: 100  Resp: 16  SpO2: 98%   Wt Readings from Last 3 Encounters:  12/11/21 148 lb (67.1 kg)  07/02/21 149 lb 6.4 oz (67.8 kg)  05/08/21 148 lb (67.1 kg)   Body mass index is 22.5 kg/m.  Physical Exam Vitals and nursing note reviewed.  Constitutional:      General: He is not in acute distress.    Appearance: He is well-developed and normal weight. He is not ill-appearing.  HENT:     Head: Normocephalic and atraumatic.  Eyes:     Conjunctiva/sclera: Conjunctivae normal.  Cardiovascular:     Rate and Rhythm: Normal rate and regular rhythm.     Heart sounds: No murmur heard. Pulmonary:     Effort: Pulmonary effort is normal. No respiratory distress.  Musculoskeletal:     Right shoulder: No deformity, tenderness or bony tenderness. Normal range of motion.     Comments: Discomfort on lateral area elicited when lying on exam table. Right shoulder: No deformity, edema, or erythema appreciated.No muscle atrophy. Luan Pulling' test neg, drop arm rotator cuff test neg, empty can supraspinatus test neg, cross body adduction test neg, lift-Off Subscapularis test neg.   Lymphadenopathy:     Cervical: No cervical adenopathy.  Skin:    General: Skin is warm.     Findings: No erythema or rash.  Neurological:     General: No focal deficit present.     Mental Status: He is alert and oriented to person, place, and time.     Gait: Gait normal.  Psychiatric:        Mood and Affect: Mood and affect normal.   ASSESSMENT AND PLAN:  Douglas Matthews was seen today for shoulder pain.  Diagnoses and all orders for this visit: Orders Placed This Encounter  Procedures   DG Shoulder Right   Acute pain of right shoulder We discussed possible etiologies. Pain is not elicit with movement or palpation but when lying down on examination table. No hx of trauma but because hx of paget's disease, shoulder X ray was ordered. Topical lidocaine at bedtime may help. Monitor  for new symptoms including rash, instructed to let me know if he notices any.  -     Lidocaine 3 % CREA; Apply 1 application topically at bedtime.  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 We discussed Dx,prognosis,and treatment options. He voices understanding and still not interested in treatment or referral.  Stage 3b chronic kidney disease ([place]) Stable. BP adequately controlled. Following with nephrologist.  Return if symptoms worsen or fail to improve, for Keep next appt.  Ayomide Zuleta G. [place], MD  [facility]. [place] office.

## 2022-01-08 DIAGNOSIS — H524 Presbyopia: Secondary | ICD-10-CM | POA: Diagnosis not present

## 2022-01-08 DIAGNOSIS — H5203 Hypermetropia, bilateral: Secondary | ICD-10-CM | POA: Diagnosis not present

## 2022-01-08 DIAGNOSIS — H43821 Vitreomacular adhesion, right eye: Secondary | ICD-10-CM | POA: Diagnosis not present

## 2022-01-08 DIAGNOSIS — H18513 Endothelial corneal dystrophy, bilateral: Secondary | ICD-10-CM | POA: Diagnosis not present

## 2022-01-08 DIAGNOSIS — H2513 Age-related nuclear cataract, bilateral: Secondary | ICD-10-CM | POA: Diagnosis not present

## 2022-01-08 DIAGNOSIS — H40013 Open angle with borderline findings, low risk, bilateral: Secondary | ICD-10-CM | POA: Diagnosis not present

## 2022-01-08 DIAGNOSIS — H52223 Regular astigmatism, bilateral: Secondary | ICD-10-CM | POA: Diagnosis not present

## 2022-04-14 IMAGING — DX DG SHOULDER 2+V*R*
3 series · 3 of 3 positions shown · non-contrast
Comparison: None.

CLINICAL DATA: Right shoulder pain no history of trauma history of
Paget's disease.

EXAM:
RIGHT SHOULDER - 2+ VIEW

[shoulder internal rotation ap]
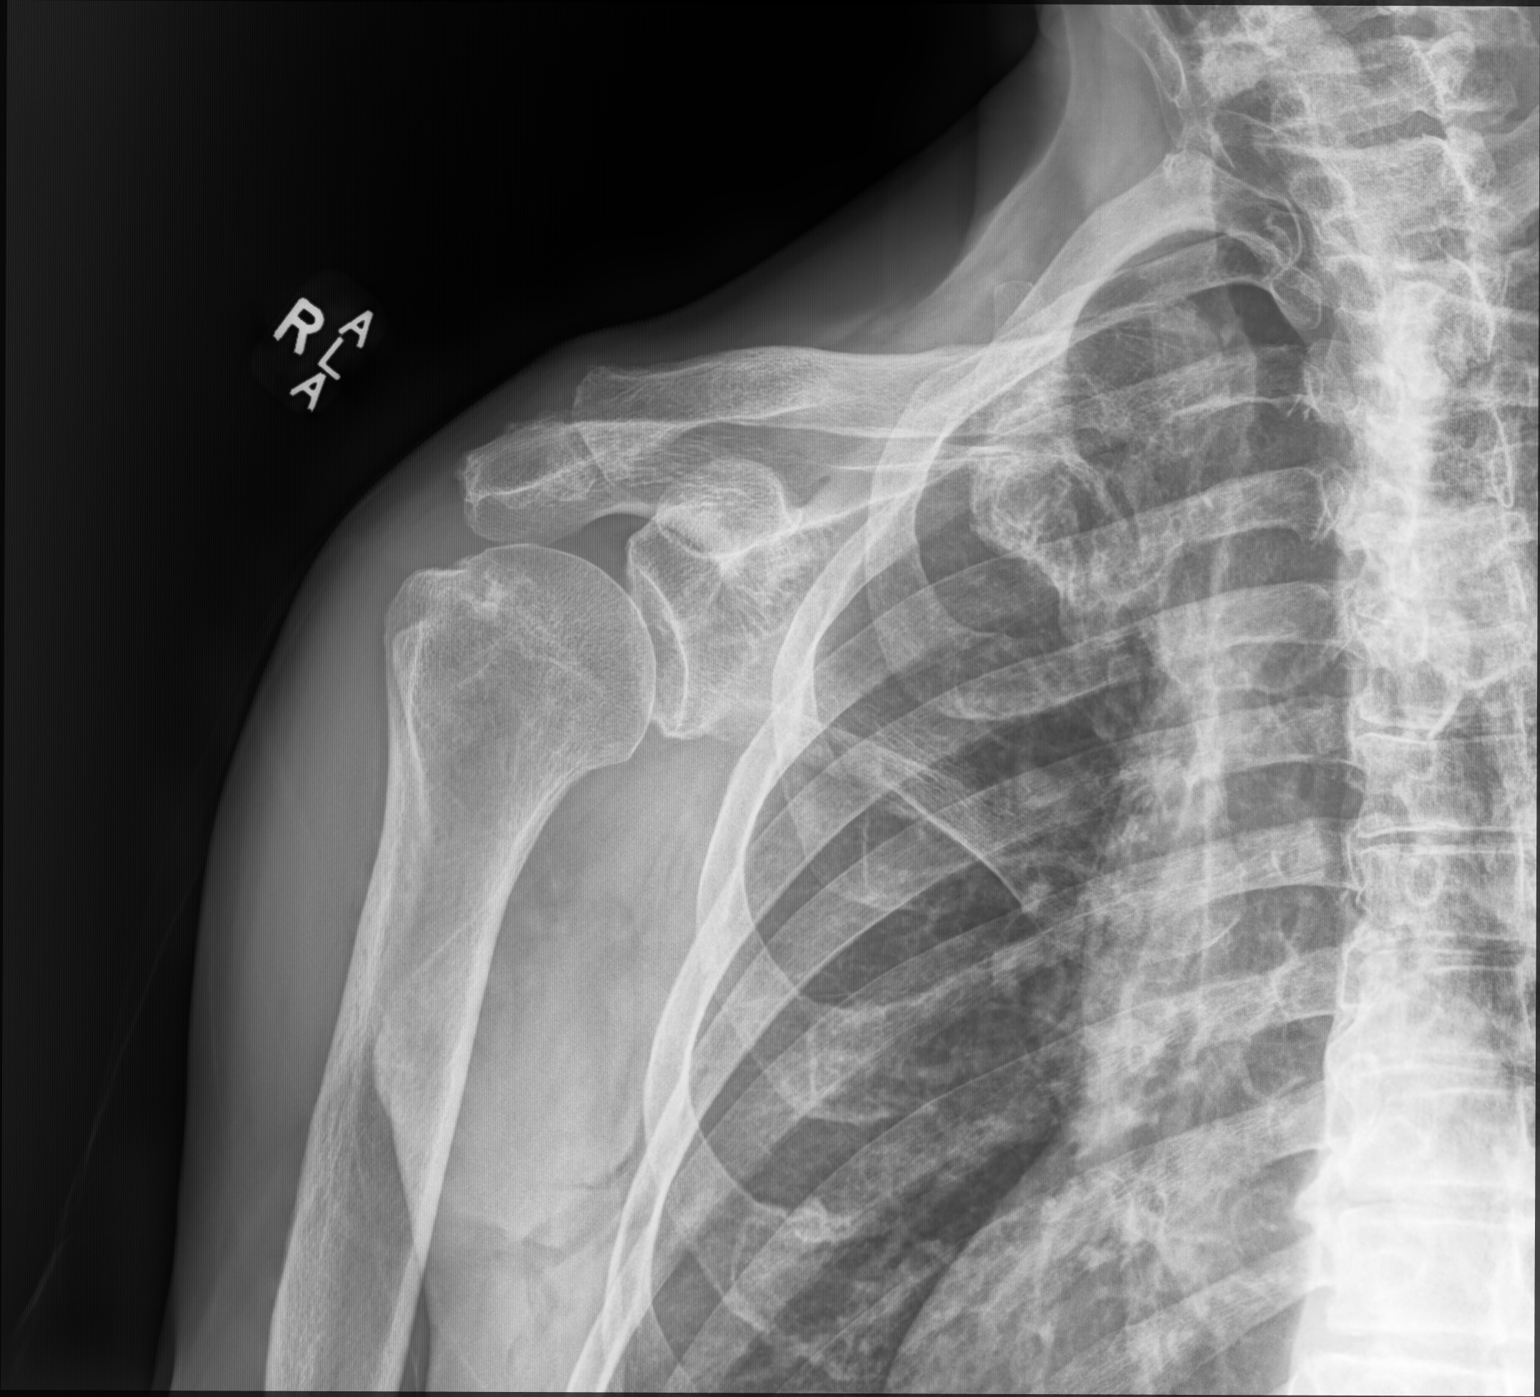

[shoulder (y view)]
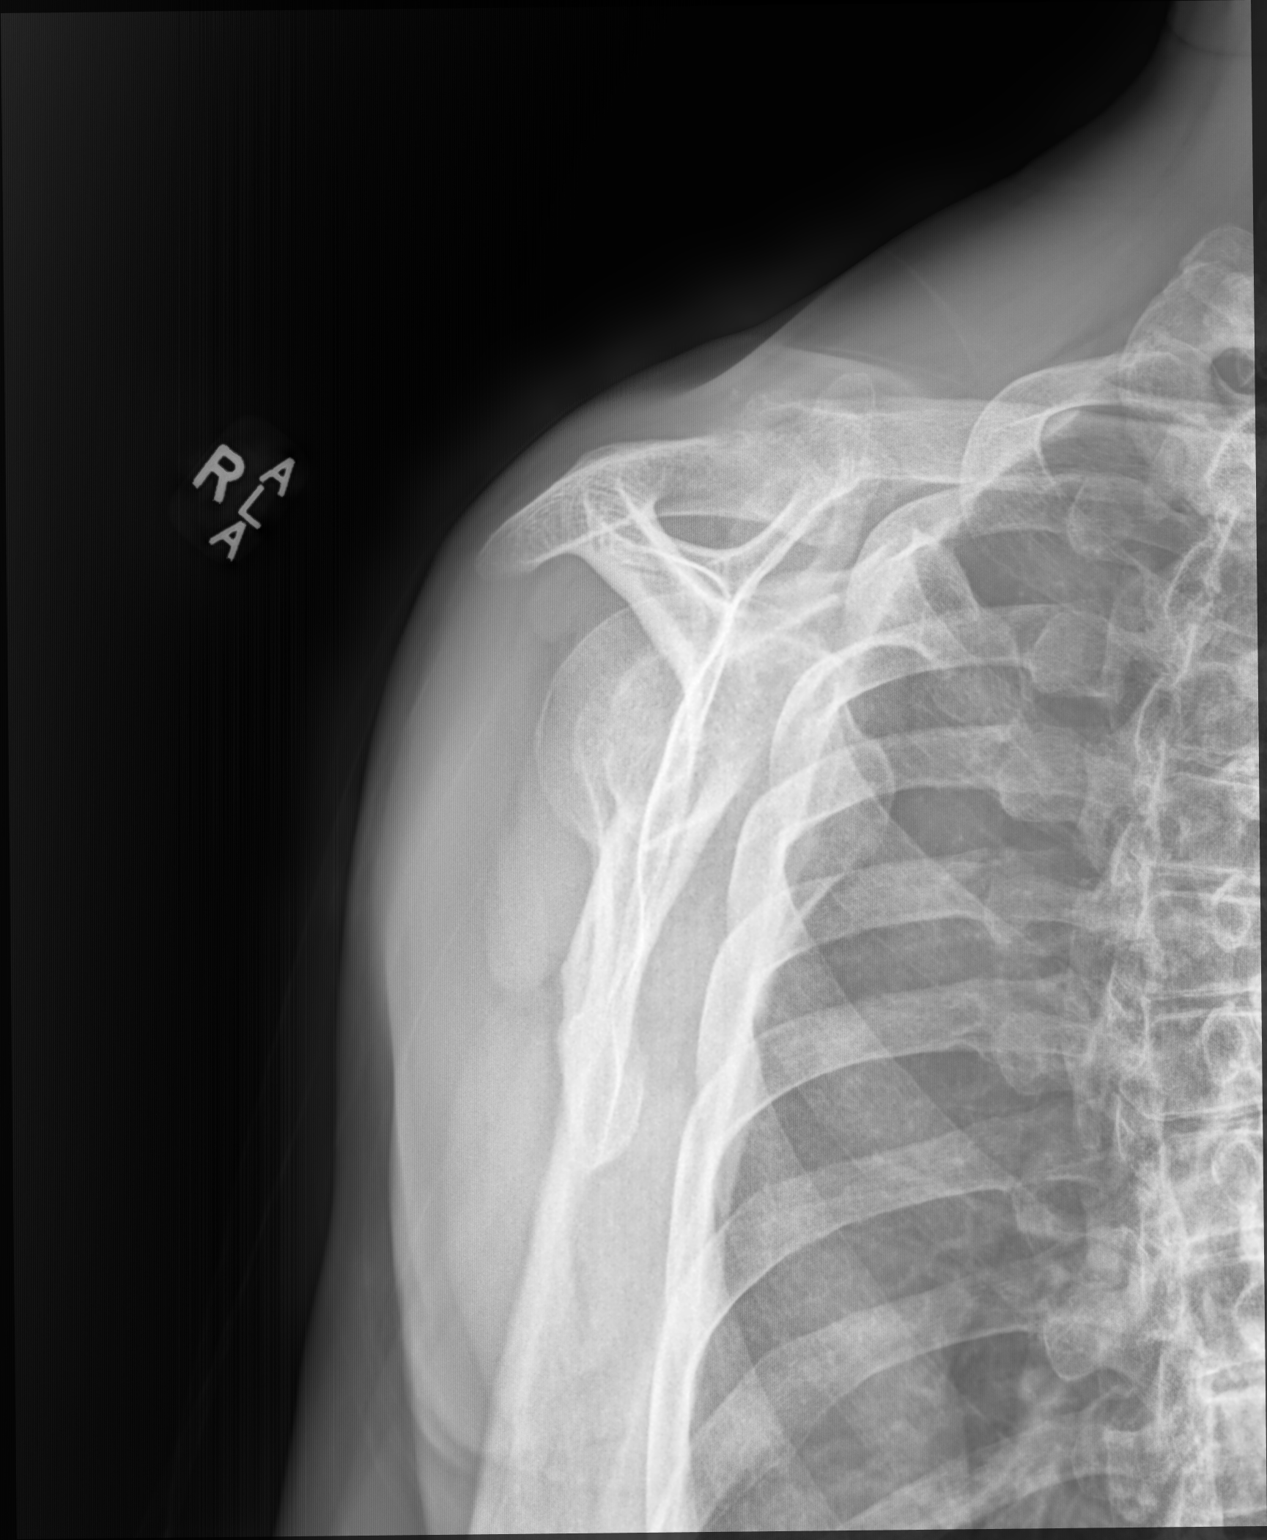

[shoulder (axial)]
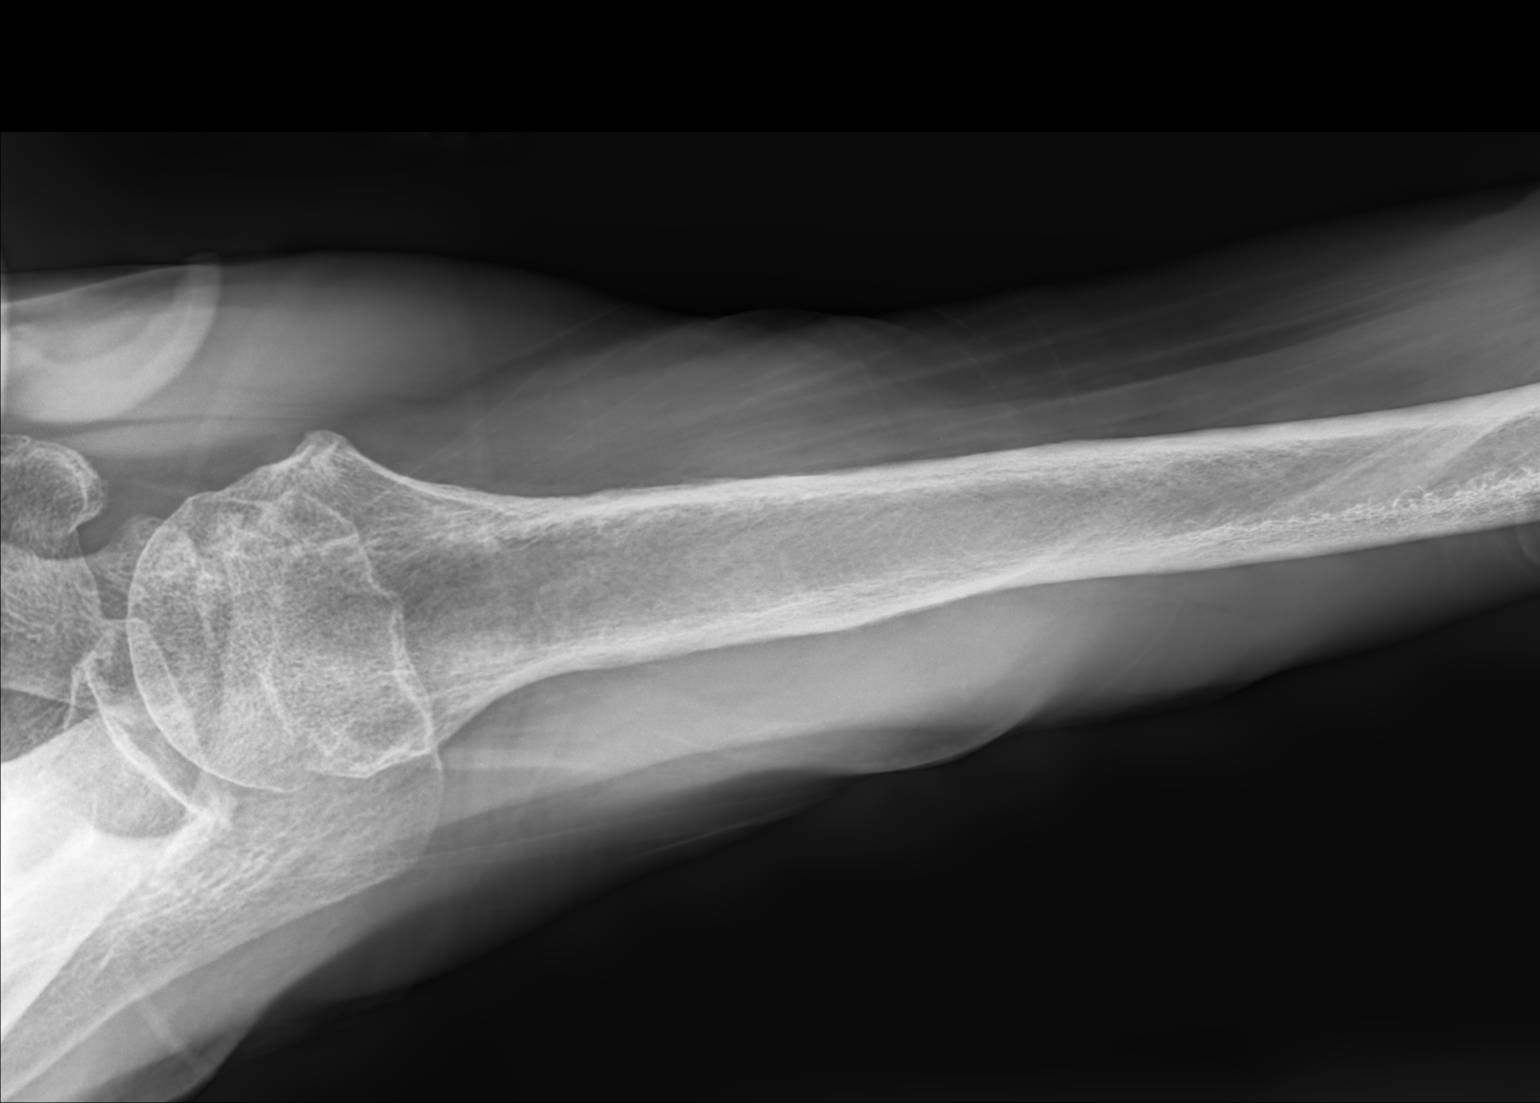

[3 of 3 positions shown; findings below may reference images not displayed]

FINDINGS: There is no evidence of fracture or dislocation. Mild
acromioclavicular and glenohumeral osteoarthritis. No suspicious
osseous lesion. Soft tissues are unremarkable.
IMPRESSION: No evidence of fracture or dislocation.

Mild glenohumeral and acromioclavicular osteoarthritis.

## 2022-04-16 DIAGNOSIS — N1832 Chronic kidney disease, stage 3b: Secondary | ICD-10-CM | POA: Diagnosis not present

## 2022-04-16 DIAGNOSIS — Z91148 Patient's other noncompliance with medication regimen for other reason: Secondary | ICD-10-CM | POA: Diagnosis not present

## 2022-04-16 DIAGNOSIS — I129 Hypertensive chronic kidney disease with stage 1 through stage 4 chronic kidney disease, or unspecified chronic kidney disease: Secondary | ICD-10-CM | POA: Diagnosis not present

## 2022-04-16 DIAGNOSIS — D638 Anemia in other chronic diseases classified elsewhere: Secondary | ICD-10-CM | POA: Diagnosis not present

## 2022-04-16 DIAGNOSIS — E785 Hyperlipidemia, unspecified: Secondary | ICD-10-CM | POA: Diagnosis not present

## 2022-04-16 DIAGNOSIS — E875 Hyperkalemia: Secondary | ICD-10-CM | POA: Diagnosis not present

## 2022-04-16 DIAGNOSIS — N2581 Secondary hyperparathyroidism of renal origin: Secondary | ICD-10-CM | POA: Diagnosis not present

## 2022-04-17 LAB — BASIC METABOLIC PANEL
BUN: 57 — AB (ref 4–21)
CO2: 18 (ref 13–22)
Chloride: 104 (ref 99–108)
Creatinine: 2.6 — AB (ref 0.6–1.3)
Glucose: 78
Potassium: 5.3 mEq/L — AB (ref 3.5–5.1)
Sodium: 137 (ref 137–147)

## 2022-04-17 LAB — COMPREHENSIVE METABOLIC PANEL
Albumin: 4.4 (ref 3.5–5.0)
Calcium: 9.1 (ref 8.7–10.7)

## 2022-04-17 LAB — CBC AND DIFFERENTIAL
HCT: 27 — AB (ref 41–53)
Hemoglobin: 9.3 — AB (ref 13.5–17.5)
Neutrophils Absolute: 2.4
Platelets: 238 10*3/uL (ref 150–400)
WBC: 4.6

## 2022-04-17 LAB — CBC: RBC: 3.16 — AB (ref 3.87–5.11)

## 2022-04-17 LAB — IRON,TIBC AND FERRITIN PANEL
%SAT: 20
Ferritin: 284
Iron: 57
TIBC: 291
UIBC: 234

## 2022-04-17 LAB — VITAMIN D 25 HYDROXY (VIT D DEFICIENCY, FRACTURES): Vit D, 25-Hydroxy: 38.1

## 2022-04-23 ENCOUNTER — Encounter: Payer: Self-pay | Admitting: Family Medicine

## 2022-05-02 DIAGNOSIS — N183 Chronic kidney disease, stage 3 unspecified: Secondary | ICD-10-CM | POA: Diagnosis not present

## 2022-06-09 NOTE — Progress Notes (Unsigned)
HPI: [name] is a 87 y.o.male here today for his routine physical examination.  Last CPE: 05/08/21  About twice per week he goes to the [facility] and does the stationary bike and machines. In general he follows a healthful diet, his wife cooks his meals.  Chronic medical problems: CKD 3, hyperlipidemia, hypertension, Paget's disease of the bone, diastolic dysfunction, BPH among some. History of elevated alkaline phosphatase, pelvic x-ray in 05/2020 show osteoporosis and changes suggesting Paget's disease.  He has not been interested in pharmacologic treatment. He denies any back pain or arthralgias.  Immunization History  Administered Date(s) Administered   Influenza Split 12/15/2011   Influenza Whole 09/01/2003, 09/19/2010   Influenza, High Dose Seasonal PF 09/22/2017   Influenza-Unspecified 08/31/2014   PFIZER(Purple Top)SARS-COV-2 Vaccination 12/23/2019, 01/22/2020, 09/03/2020, 04/12/2021   Pneumococcal Conjugate-13 01/09/2014   Pneumococcal Polysaccharide-23 12/01/2001, 06/13/2008   Td 12/01/1996, 06/13/2008   Tdap 07/16/2018   Zoster Recombinat (Shingrix) 04/13/2018, 09/01/2018    Health Maintenance  Topic Date Due   COVID-19 Vaccine (5 - Pfizer series) 06/26/2022 (Originally 06/07/2021)   INFLUENZA VACCINE  07/01/2022   TETANUS/TDAP  07/16/2028   Pneumonia Vaccine 87+ Years old  Completed   Zoster Vaccines- Shingrix  Completed   HPV VACCINES  Aged Out   He has no concerns today. CKD III: He follows with nephrologist every 6 months. Hyperlipidemia: He has declined pharmacologic treatment.  Lab Results  Component Value Date   CHOL 259 (H) 05/08/2021   HDL 59.40 05/08/2021   LDLCALC 182 (H) 05/08/2021   LDLDIRECT 220.5 12/15/2011   TRIG 87.0 05/08/2021   CHOLHDL 4 05/08/2021   Hypertension: Currently he is on amlodipine 10 mg daily.  Review of Systems  Constitutional:  Negative for activity change, appetite change and fever.  HENT:  Negative for  nosebleeds, sore throat, trouble swallowing and voice change.   Eyes:  Negative for redness and visual disturbance.  Respiratory:  Negative for cough, shortness of breath and wheezing.   Cardiovascular:  Negative for chest pain, palpitations and leg swelling.  Gastrointestinal:  Negative for abdominal pain, blood in stool, nausea and vomiting.  Endocrine: Negative for cold intolerance, heat intolerance, polydipsia, polyphagia and polyuria.  Genitourinary:  Negative for decreased urine volume, dysuria, genital sores, hematuria and testicular pain.  Musculoskeletal:  Negative for arthralgias, back pain, joint swelling and myalgias.  Skin:  Negative for color change and rash.  Allergic/Immunologic: Negative for environmental allergies.  Neurological:  Negative for dizziness, syncope, weakness and headaches.  Hematological:  Negative for adenopathy. Does not bruise/bleed easily.  Psychiatric/Behavioral:  Negative for confusion and sleep disturbance. The patient is not nervous/anxious.    Current Outpatient Medications on File Prior to Visit  Medication Sig Dispense Refill   amLODipine (NORVASC) 10 MG tablet Take 10 mg by mouth daily.     COD LIVER OIL PO Take by mouth.     GARLIC PO Take by mouth.     Multiple Vitamin (MULTIVITAMIN) capsule Take 1 capsule by mouth daily. One a day     saw palmetto 500 MG capsule Take 500 mg by mouth daily.     No current facility-administered medications on file prior to visit.   Past Medical History:  Diagnosis Date   Anemia    BPH (benign prostatic hypertrophy)    Diverticulosis    Hyperlipidemia    Hypertension    Past Surgical History:  Procedure Laterality Date   APPENDECTOMY     HERNIA REPAIR  No Known Allergies  Family History  Problem Relation Age of Onset   Heart disease Other    Social History   Socioeconomic History   Marital status: Married    Spouse name: Not on file   Number of children: Not on file   Years of education:  Not on file   Highest education level: Not on file  Occupational History   Occupation: Retired  Tobacco Use   Smoking status: Never   Smokeless tobacco: Never  Substance and Sexual Activity   Alcohol use: No   Drug use: No   Sexual activity: Not on file  Other Topics Concern   Not on file  Social History Narrative   Married x 50 years    Social Determinants of Health   Financial Resource Strain: Low Risk  (07/02/2021)   Overall Financial Resource Strain (CARDIA)    Difficulty of Paying Living Expenses: Not hard at all  Food Insecurity: No Food Insecurity (07/02/2021)   Hunger Vital Sign    Worried About Running Out of Food in the Last Year: Never true    Ran Out of Food in the Last Year: Never true  Transportation Needs: No Transportation Needs (07/02/2021)   PRAPARE - Administrator, Civil Service (Medical): No    Lack of Transportation (Non-Medical): No  Physical Activity: Insufficiently Active (07/02/2021)   Exercise Vital Sign    Days of Exercise per Week: 2 days    Minutes of Exercise per Session: 30 min  Stress: No Stress Concern Present (07/02/2021)   [employer] of Occupational Health - Occupational Stress Questionnaire    Feeling of Stress : Not at all  Social Connections: Moderately Integrated (07/02/2021)   Social Connection and Isolation Panel [NHANES]    Frequency of Communication with Friends and Family: Three times a week    Frequency of Social Gatherings with Friends and Family: Three times a week    Attends Religious Services: More than 4 times per year    Active Member of Clubs or Organizations: No    Attends Banker Meetings: Never    Marital Status: Married   Vitals:   06/10/22 0840  BP: 122/76  Pulse: 100  Resp: 12  SpO2: 99%   Body mass index is 22.5 kg/m. Wt Readings from Last 3 Encounters:  06/10/22 148 lb (67.1 kg)  12/11/21 148 lb (67.1 kg)  07/02/21 149 lb 6.4 oz (67.8 kg)   Physical Exam Vitals and nursing note  reviewed.  Constitutional:      General: He is not in acute distress.    Appearance: He is well-developed.  HENT:     Head: Normocephalic and atraumatic.     Right Ear: External ear normal.     Left Ear: Tympanic membrane, ear canal and external ear normal.     Ears:     Comments: Cerumen excess right ear canal, cannot see TM.    Mouth/Throat:     Mouth: Mucous membranes are moist.     Dentition: Has dentures.     Pharynx: Oropharynx is clear.  Eyes:     Conjunctiva/sclera: Conjunctivae normal.     Pupils: Pupils are equal, round, and reactive to light.  Neck:     Thyroid: No thyroid mass.  Cardiovascular:     Rate and Rhythm: Normal rate and regular rhythm.     Pulses:          Dorsalis pedis pulses are 2+ on the right side and  2+ on the left side.     Heart sounds: No murmur heard.    Comments: DP and PT pulses difficult to find, normal capillary refill. Pulmonary:     Effort: Pulmonary effort is normal. No respiratory distress.     Breath sounds: Normal breath sounds.  Abdominal:     Palpations: Abdomen is soft. There is no hepatomegaly or mass.     Tenderness: There is no abdominal tenderness.  Genitourinary:    Comments: No concerns. Musculoskeletal:        General: No tenderness.     Cervical back: Normal range of motion.     Comments: No signs of synovitis.  Lymphadenopathy:     Cervical: No cervical adenopathy.  Skin:    General: Skin is warm.     Findings: No erythema.  Neurological:     General: No focal deficit present.     Mental Status: He is alert and oriented to person, place, and time.     Cranial Nerves: No cranial nerve deficit.     Sensory: No sensory deficit.     Gait: Gait normal.     Deep Tendon Reflexes:     Reflex Scores:      Bicep reflexes are 2+ on the right side and 2+ on the left side.      Patellar reflexes are 2+ on the right side and 2+ on the left side. Psychiatric:        Mood and Affect: Mood and affect normal.   ASSESSMENT AND  PLAN:  [name] was seen today for annual exam.  Diagnoses and all orders for this visit: Orders Placed This Encounter  Procedures   VAS [place] ABI WITH/WO TBI   Routine general medical examination at a health care facility We discussed the importance of regular physical activity and healthy diet for prevention of chronic illness and/or complications. Preventive guidelines reviewed. Fall precautions. Vaccination up to date. Next CPE in a year.  Decreased dorsalis pedis pulse He is now having claudication-like symptoms. He agrees with arranging ABI.  Hyperlipidemia He does not want pharmacologic treatment. Prefers to hold on labs for now.  Essential hypertension BP adequately controlled. Continue amlodipine 10 mg daily and low-salt diet. Following with nephrologist every 6 months.  CKD (chronic kidney disease), stage III [facility]) Following with nephrologist every 6 months.  Return in 1 year (on 06/11/2023).  Douglas Matthews G. Martin[place], MD  [facility]. [place] office.

## 2022-06-10 ENCOUNTER — Ambulatory Visit (INDEPENDENT_AMBULATORY_CARE_PROVIDER_SITE_OTHER): Payer: Medicare Other | Admitting: Family Medicine

## 2022-06-10 ENCOUNTER — Encounter: Payer: Self-pay | Admitting: Family Medicine

## 2022-06-10 VITALS — BP 122/76 | HR 100 | Resp 12 | Ht 68.0 in | Wt 148.0 lb

## 2022-06-10 DIAGNOSIS — E785 Hyperlipidemia, unspecified: Secondary | ICD-10-CM

## 2022-06-10 DIAGNOSIS — R0989 Other specified symptoms and signs involving the circulatory and respiratory systems: Secondary | ICD-10-CM | POA: Diagnosis not present

## 2022-06-10 DIAGNOSIS — N1831 Chronic kidney disease, stage 3a: Secondary | ICD-10-CM

## 2022-06-10 DIAGNOSIS — I10 Essential (primary) hypertension: Secondary | ICD-10-CM

## 2022-06-10 DIAGNOSIS — Z0000 Encounter for general adult medical examination without abnormal findings: Secondary | ICD-10-CM | POA: Diagnosis not present

## 2022-06-10 NOTE — Assessment & Plan Note (Signed)
Following with nephrologist every 6 months.

## 2022-06-10 NOTE — Assessment & Plan Note (Signed)
He does not want pharmacologic treatment. Prefers to hold on labs for now.

## 2022-06-10 NOTE — Patient Instructions (Addendum)
A few things to remember from today's visit:  Routine general medical examination at a health care facility  Stage 3a chronic kidney disease (HCC)  Hyperlipidemia, unspecified hyperlipidemia type  Decreased dorsalis pedis pulse - Plan: VAS [place] ABI WITH/WO TBI  Do not use My Chart to request refills or for acute issues that need immediate attention.   Please be sure medication list is accurate. If a new problem present, please set up appointment sooner than planned today.  Preventive Care 67 Years and Older, Male Preventive care refers to lifestyle choices and visits with your health care provider that can promote health and wellness. Preventive care visits are also called wellness exams. What can I expect for my preventive care visit? Counseling During your preventive care visit, your health care provider may ask about your: Medical history, including: Past medical problems. Family medical history. History of falls. Current health, including: Emotional well-being. Home life and relationship well-being. Sexual activity. Memory and ability to understand (cognition). Lifestyle, including: Alcohol, nicotine or tobacco, and drug use. Access to firearms. Diet, exercise, and sleep habits. Work and work Astronomer. Sunscreen use. Safety issues such as seatbelt and bike helmet use. Physical exam Your health care provider will check your: Height and weight. These may be used to calculate your BMI (body mass index). BMI is a measurement that tells if you are at a healthy weight. Waist circumference. This measures the distance around your waistline. This measurement also tells if you are at a healthy weight and may help predict your risk of certain diseases, such as type 2 diabetes and high blood pressure. Heart rate and blood pressure. Body temperature. Skin for abnormal spots. What immunizations do I need?  Vaccines are usually given at various ages, according to a schedule. Your  health care provider will recommend vaccines for you based on your age, medical history, and lifestyle or other factors, such as travel or where you work. What tests do I need? Screening Your health care provider may recommend screening tests for certain conditions. This may include: Lipid and cholesterol levels. Diabetes screening. This is done by checking your blood sugar (glucose) after you have not eaten for a while (fasting). Hepatitis C test. Hepatitis B test. HIV (human immunodeficiency virus) test. STI (sexually transmitted infection) testing, if you are at risk. Lung cancer screening. Colorectal cancer screening. Prostate cancer screening. Abdominal aortic aneurysm (AAA) screening. You may need this if you are a current or former smoker. Talk with your health care provider about your test results, treatment options, and if necessary, the need for more tests. Follow these instructions at home: Eating and drinking  Eat a diet that includes fresh fruits and vegetables, whole grains, lean protein, and low-fat dairy products. Limit your intake of foods with high amounts of sugar, saturated fats, and salt. Take vitamin and mineral supplements as recommended by your health care provider. Do not drink alcohol if your health care provider tells you not to drink. If you drink alcohol: Limit how much you have to 0-2 drinks a day. Know how much alcohol is in your drink. In the [place] one drink equals one 12 oz bottle of beer (355 mL), one 5 oz glass of wine (148 mL), or one 1 oz glass of hard liquor (44 mL). Lifestyle Brush your teeth every morning and night with fluoride toothpaste. Floss one time each day. Exercise for at least 30 minutes 5 or more days each week. Do not use any products that contain nicotine or tobacco.  These products include cigarettes, chewing tobacco, and vaping devices, such as e-cigarettes. If you need help quitting, ask your health care provider. Do not use  drugs. If you are sexually active, practice safe sex. Use a condom or other form of protection to prevent STIs. Take aspirin only as told by your health care provider. Make sure that you understand how much to take and what form to take. Work with your health care provider to find out whether it is safe and beneficial for you to take aspirin daily. Ask your health care provider if you need to take a cholesterol-lowering medicine (statin). Find healthy ways to manage stress, such as: Meditation, yoga, or listening to music. Journaling. Talking to a trusted person. Spending time with friends and family. Safety Always wear your seat belt while driving or riding in a vehicle. Do not drive: If you have been drinking alcohol. Do not ride with someone who has been drinking. When you are tired or distracted. While texting. If you have been using any mind-altering substances or drugs. Wear a helmet and other protective equipment during sports activities. If you have firearms in your house, make sure you follow all gun safety procedures. Minimize exposure to UV radiation to reduce your risk of skin cancer. What's next? Visit your health care provider once a year for an annual wellness visit. Ask your health care provider how often you should have your eyes and teeth checked. Stay up to date on all vaccines. This information is not intended to replace advice given to you by your health care provider. Make sure you discuss any questions you have with your health care provider. Document Revised: 05/15/2021 Document Reviewed: 05/15/2021 Elsevier Patient Education  2023 [employer].

## 2022-06-10 NOTE — Assessment & Plan Note (Signed)
BP adequately controlled. Continue amlodipine 10 mg daily and low-salt diet. Following with nephrologist every 6 months.

## 2022-06-20 ENCOUNTER — Ambulatory Visit (HOSPITAL_COMMUNITY)
Admission: RE | Admit: 2022-06-20 | Discharge: 2022-06-20 | Disposition: A | Discharge status: Home / Self Care | Payer: Medicare Other | Source: Ambulatory Visit | Attending: Cardiovascular Disease | Admitting: Cardiovascular Disease

## 2022-06-20 DIAGNOSIS — R0989 Other specified symptoms and signs involving the circulatory and respiratory systems: Secondary | ICD-10-CM | POA: Diagnosis not present

## 2022-07-08 ENCOUNTER — Ambulatory Visit (INDEPENDENT_AMBULATORY_CARE_PROVIDER_SITE_OTHER): Payer: Medicare Other

## 2022-07-08 VITALS — BP 132/68 | HR 89 | Temp 97.9°F | Ht 67.5 in | Wt 150.3 lb

## 2022-07-08 DIAGNOSIS — Z0000 Encounter for general adult medical examination without abnormal findings: Secondary | ICD-10-CM | POA: Diagnosis not present

## 2022-07-08 NOTE — Patient Instructions (Signed)
Douglas Matthews , Thank you for taking time to come for your Medicare Wellness Visit. I appreciate your ongoing commitment to your health goals. Please review the following plan we discussed and let me know if I can assist you in the future.   Screening recommendations/referrals: Colonoscopy: not required Recommended yearly ophthalmology/optometry visit for glaucoma screening and checkup Recommended yearly dental visit for hygiene and checkup  Vaccinations: Influenza vaccine: due Pneumococcal vaccine: completed 01/09/2014 Tdap vaccine: completed 07/16/2018, due 07/16/2028 Shingles vaccine: completed   Covid-19:  04/12/2021, 09/03/2020, 01/22/2020, 12/23/2019  Advanced directives: Please bring a copy of your POA (Power of Attorney) and/or Living Will to your next appointment.    Conditions/risks identified: none  Next appointment: Follow up in one year for your annual wellness visit.   Preventive Care 85 Years and Older, Male Preventive care refers to lifestyle choices and visits with your health care provider that can promote health and wellness. What does preventive care include? A yearly physical exam. This is also called an annual well check. Dental exams once or twice a year. Routine eye exams. Ask your health care provider how often you should have your eyes checked. Personal lifestyle choices, including: Daily care of your teeth and gums. Regular physical activity. Eating a healthy diet. Avoiding tobacco and drug use. Limiting alcohol use. Practicing safe sex. Taking low doses of aspirin every day. Taking vitamin and mineral supplements as recommended by your health care provider. What happens during an annual well check? The services and screenings done by your health care provider during your annual well check will depend on your age, overall health, lifestyle risk factors, and family history of disease. Counseling  Your health care provider may ask you questions about  your: Alcohol use. Tobacco use. Drug use. Emotional well-being. Home and relationship well-being. Sexual activity. Eating habits. History of falls. Memory and ability to understand (cognition). Work and work Astronomer. Screening  You may have the following tests or measurements: Height, weight, and BMI. Blood pressure. Lipid and cholesterol levels. These may be checked every 5 years, or more frequently if you are over 58 years old. Skin check. Lung cancer screening. You may have this screening every year starting at age 75 if you have a 30-pack-year history of smoking and currently smoke or have quit within the past 15 years. Fecal occult blood test (FOBT) of the stool. You may have this test every year starting at age 56. Flexible sigmoidoscopy or colonoscopy. You may have a sigmoidoscopy every 5 years or a colonoscopy every 10 years starting at age 57. Prostate cancer screening. Recommendations will vary depending on your family history and other risks. Hepatitis C blood test. Hepatitis B blood test. Sexually transmitted disease (STD) testing. Diabetes screening. This is done by checking your blood sugar (glucose) after you have not eaten for a while (fasting). You may have this done every 1-3 years. Abdominal aortic aneurysm (AAA) screening. You may need this if you are a current or former smoker. Osteoporosis. You may be screened starting at age 65 if you are at high risk. Talk with your health care provider about your test results, treatment options, and if necessary, the need for more tests. Vaccines  Your health care provider may recommend certain vaccines, such as: Influenza vaccine. This is recommended every year. Tetanus, diphtheria, and acellular pertussis (Tdap, Td) vaccine. You may need a Td booster every 10 years. Zoster vaccine. You may need this after age 53. Pneumococcal 13-valent conjugate (PCV13) vaccine. One dose is  recommended after age 66. Pneumococcal  polysaccharide (PPSV23) vaccine. One dose is recommended after age 43. Talk to your health care provider about which screenings and vaccines you need and how often you need them. This information is not intended to replace advice given to you by your health care provider. Make sure you discuss any questions you have with your health care provider. Document Released: 12/14/2015 Document Revised: 08/06/2016 Document Reviewed: 09/18/2015 Elsevier Interactive Patient Education  2017 [place] Prevention in the Home Falls can cause injuries. They can happen to people of all ages. There are many things you can do to make your home safe and to help prevent falls. What can I do on the outside of my home? Regularly fix the edges of walkways and driveways and fix any cracks. Remove anything that might make you trip as you walk through a door, such as a raised step or threshold. Trim any bushes or trees on the path to your home. Use bright outdoor lighting. Clear any walking paths of anything that might make someone trip, such as rocks or tools. Regularly check to see if handrails are loose or broken. Make sure that both sides of any steps have handrails. Any raised decks and porches should have guardrails on the edges. Have any leaves, snow, or ice cleared regularly. Use sand or salt on walking paths during winter. Clean up any spills in your garage right away. This includes oil or grease spills. What can I do in the bathroom? Use night lights. Install grab bars by the toilet and in the tub and shower. Do not use towel bars as grab bars. Use non-skid mats or decals in the tub or shower. If you need to sit down in the shower, use a plastic, non-slip stool. Keep the floor dry. Clean up any water that spills on the floor as soon as it happens. Remove soap buildup in the tub or shower regularly. Attach bath mats securely with double-sided non-slip rug tape. Do not have throw rugs and other  things on the floor that can make you trip. What can I do in the bedroom? Use night lights. Make sure that you have a light by your bed that is easy to reach. Do not use any sheets or blankets that are too big for your bed. They should not hang down onto the floor. Have a firm chair that has side arms. You can use this for support while you get dressed. Do not have throw rugs and other things on the floor that can make you trip. What can I do in the kitchen? Clean up any spills right away. Avoid walking on wet floors. Keep items that you use a lot in easy-to-reach places. If you need to reach something above you, use a strong step stool that has a grab bar. Keep electrical cords out of the way. Do not use floor polish or wax that makes floors slippery. If you must use wax, use non-skid floor wax. Do not have throw rugs and other things on the floor that can make you trip. What can I do with my stairs? Do not leave any items on the stairs. Make sure that there are handrails on both sides of the stairs and use them. Fix handrails that are broken or loose. Make sure that handrails are as long as the stairways. Check any carpeting to make sure that it is firmly attached to the stairs. Fix any carpet that is loose or worn. Avoid having  throw rugs at the top or bottom of the stairs. If you do have throw rugs, attach them to the floor with carpet tape. Make sure that you have a light switch at the top of the stairs and the bottom of the stairs. If you do not have them, ask someone to add them for you. What else can I do to help prevent falls? Wear shoes that: Do not have high heels. Have rubber bottoms. Are comfortable and fit you well. Are closed at the toe. Do not wear sandals. If you use a stepladder: Make sure that it is fully opened. Do not climb a closed stepladder. Make sure that both sides of the stepladder are locked into place. Ask someone to hold it for you, if possible. Clearly  mark and make sure that you can see: Any grab bars or handrails. First and last steps. Where the edge of each step is. Use tools that help you move around (mobility aids) if they are needed. These include: Canes. Walkers. Scooters. Crutches. Turn on the lights when you go into a dark area. Replace any light bulbs as soon as they burn out. Set up your furniture so you have a clear path. Avoid moving your furniture around. If any of your floors are uneven, fix them. If there are any pets around you, be aware of where they are. Review your medicines with your doctor. Some medicines can make you feel dizzy. This can increase your chance of falling. Ask your doctor what other things that you can do to help prevent falls. This information is not intended to replace advice given to you by your health care provider. Make sure you discuss any questions you have with your health care provider. Document Released: 09/13/2009 Document Revised: 04/24/2016 Document Reviewed: 12/22/2014 Elsevier Interactive Patient Education  2017 [employer].

## 2022-07-08 NOTE — Progress Notes (Signed)
Subjective:   Douglas Matthews is a 87 y.o. male who presents for Medicare Annual/Subsequent preventive examination.  Review of Systems     Cardiac Risk Factors include: advanced age (>5men, >63 women);dyslipidemia;hypertension;male gender     Objective:    Today's Vitals   07/08/22 1432  BP: 132/68  Pulse: 89  Temp: 97.9 F (36.6 C)  TempSrc: Oral  SpO2: 99%  Weight: 150 lb 4.8 oz (68.2 kg)  Height: 5' 7.5" (1.715 m)   Body mass index is 23.19 kg/m.     07/08/2022    2:40 PM 07/02/2021    2:41 PM 06/26/2020    2:33 PM 04/17/2017    8:51 AM  Advanced Directives  Does Patient Have a Medical Advance Directive? Yes Yes Yes Yes  Type of Estate agent of [place];Living will Living will Healthcare Power of [place];Living will   Copy of Healthcare Power of Attorney in Chart? No - copy requested  No - copy requested     Current Medications (verified) Outpatient Encounter Medications as of 07/08/2022  Medication Sig   amLODipine (NORVASC) 10 MG tablet Take 10 mg by mouth daily.   COD LIVER OIL PO Take by mouth.   GARLIC PO Take by mouth.   Multiple Vitamin (MULTIVITAMIN) capsule Take 1 capsule by mouth daily. One a day   saw palmetto 500 MG capsule Take 500 mg by mouth daily.   No facility-administered encounter medications on file as of 07/08/2022.    Allergies (verified) Patient has no known allergies.   History: Past Medical History:  Diagnosis Date   Anemia    BPH (benign prostatic hypertrophy)    Diverticulosis    Hyperlipidemia    Hypertension    Past Surgical History:  Procedure Laterality Date   APPENDECTOMY     HERNIA REPAIR     Family History  Problem Relation Age of Onset   Heart disease Other    Social History   Socioeconomic History   Marital status: Married    Spouse name: Not on file   Number of children: Not on file   Years of education: Not on file   Highest education level: Not on file  Occupational History    Occupation: Retired  Tobacco Use   Smoking status: Never   Smokeless tobacco: Never  Vaping Use   Vaping Use: Never used  Substance and Sexual Activity   Alcohol use: No   Drug use: No   Sexual activity: Not on file  Other Topics Concern   Not on file  Social History Narrative   Married x 50 years    Social Determinants of Health   Financial Resource Strain: Low Risk  (07/08/2022)   Overall Financial Resource Strain (CARDIA)    Difficulty of Paying Living Expenses: Not hard at all  Food Insecurity: No Food Insecurity (07/08/2022)   Hunger Vital Sign    Worried About Running Out of Food in the Last Year: Never true    Ran Out of Food in the Last Year: Never true  Transportation Needs: No Transportation Needs (07/08/2022)   PRAPARE - Administrator, Civil Service (Medical): No    Lack of Transportation (Non-Medical): No  Physical Activity: Insufficiently Active (07/08/2022)   Exercise Vital Sign    Days of Exercise per Week: 2 days    Minutes of Exercise per Session: 30 min  Stress: No Stress Concern Present (07/08/2022)   [employer] of Occupational Health - Occupational Stress Questionnaire  Feeling of Stress : Not at all  Social Connections: Moderately Integrated (07/02/2021)   Social Connection and Isolation Panel [NHANES]    Frequency of Communication with Friends and Family: Three times a week    Frequency of Social Gatherings with Friends and Family: Three times a week    Attends Religious Services: More than 4 times per year    Active Member of Clubs or Organizations: No    Attends Archivist Meetings: Never    Marital Status: Married    Tobacco Counseling Counseling given: Not Answered   Clinical Intake:  Pre-visit preparation completed: Yes  Pain : No/denies pain     Nutritional Status: BMI of 19-24  Normal Nutritional Risks: None Diabetes: No  How often do you need to have someone help you when you read instructions, pamphlets, or  other written materials from your doctor or pharmacy?: 1 - Never What is the last grade level you completed in school?: 12th grade  Diabetic? no  Interpreter Needed?: No  Information entered by :: NAllen LPN   Activities of Daily Living    07/08/2022    2:41 PM  In your present state of health, do you have any difficulty performing the following activities:  Hearing? 0  Vision? 0  Difficulty concentrating or making decisions? 0  Walking or climbing stairs? 0  Dressing or bathing? 0  Doing errands, shopping? 0  Preparing Food and eating ? N  Using the Toilet? N  In the past six months, have you accidently leaked urine? N  Do you have problems with loss of bowel control? N  Managing your Medications? N  Managing your Finances? N  Housekeeping or managing your Housekeeping? N    Patient Care Team: [place], [name], MD as PCP - General (Family Medicine)  Indicate any recent Medical Services you may have received from other than [facility] providers in the past year (date may be approximate).     Assessment:   This is a routine wellness examination for Douglas Matthews.  Hearing/Vision screen Vision Screening - Comments:: Regular eye exams, [facility], Dr. Eulas Post  Dietary issues and exercise activities discussed: Current Exercise Habits: Home exercise routine, Type of exercise: strength training/weights;Other - see comments (stationary bike), Time (Minutes): 30, Frequency (Times/Week): 2, Weekly Exercise (Minutes/Week): 60   Goals Addressed             This Visit's Progress    Patient Stated       07/08/2022, stay healthy       Depression Screen    07/08/2022    2:41 PM 06/10/2022    8:55 AM 12/11/2021   12:24 PM 07/02/2021    2:40 PM 05/08/2021   12:51 PM 06/26/2020    2:30 PM 04/16/2020    8:10 AM  PHQ 2/9 Scores  PHQ - 2 Score 0 0 0 0 0 0 0    Fall Risk    07/08/2022    2:41 PM 06/10/2022    8:55 AM 12/11/2021   12:24 PM 07/02/2021    2:41 PM 05/08/2021   12:51 PM  Fall Risk    Falls in the past year? 0 0 0 0 0  Number falls in past yr: 0 0 0 0 0  Injury with Fall? 0 0 0 0 0  Risk for fall due to : Medication side effect No Fall Risks  Impaired vision   Follow up Falls evaluation completed;Education provided;Falls prevention discussed Falls evaluation completed Education provided Falls prevention  discussed Education provided    FALL RISK PREVENTION PERTAINING TO THE HOME:  Any stairs in or around the home? Yes  If so, are there any without handrails? No  Home free of loose throw rugs in walkways, pet beds, electrical cords, etc? Yes  Adequate lighting in your home to reduce risk of falls? Yes   ASSISTIVE DEVICES UTILIZED TO PREVENT FALLS:  Life alert? No  Use of a cane, walker or w/c? No  Grab bars in the bathroom? Yes  Shower chair or bench in shower? Yes  Elevated toilet seat or a handicapped toilet? Yes   TIMED UP AND GO:  Was the test performed? No .    Gait steady and fast without use of assistive device  Cognitive Function:        07/08/2022    2:42 PM 07/02/2021    2:43 PM 06/26/2020    2:39 PM  6CIT Screen  What Year? 0 points 0 points 0 points  What month? 0 points 0 points 3 points  What time? 0 points 0 points 0 points  Count back from 20 0 points 0 points 0 points  Months in reverse 0 points 0 points 0 points  Repeat phrase 4 points 2 points 0 points  Total Score 4 points 2 points 3 points    Immunizations Immunization History  Administered Date(s) Administered   Influenza Split 12/15/2011   Influenza Whole 09/01/2003, 09/19/2010   Influenza, High Dose Seasonal PF 09/22/2017   Influenza-Unspecified 08/31/2014   PFIZER(Purple Top)SARS-COV-2 Vaccination 12/23/2019, 01/22/2020, 09/03/2020, 04/12/2021   Pneumococcal Conjugate-13 01/09/2014   Pneumococcal Polysaccharide-23 12/01/2001, 06/13/2008   Td 12/01/1996, 06/13/2008   Tdap 07/16/2018   Zoster Recombinat (Shingrix) 04/13/2018, 09/01/2018    TDAP status: Up to  date  Flu Vaccine status: Due, Education has been provided regarding the importance of this vaccine. Advised may receive this vaccine at local pharmacy or Health Dept. Aware to provide a copy of the vaccination record if obtained from local pharmacy or Health Dept. Verbalized acceptance and understanding.  Pneumococcal vaccine status: Up to date  Covid-19 vaccine status: Completed vaccines  Qualifies for Shingles Vaccine? Yes   Zostavax completed Yes   Shingrix Completed?: Yes  Screening Tests Health Maintenance  Topic Date Due   COVID-19 Vaccine (5 - Pfizer series) 06/07/2021   INFLUENZA VACCINE  07/01/2022   TETANUS/TDAP  07/16/2028   Pneumonia Vaccine 24+ Years old  Completed   Zoster Vaccines- Shingrix  Completed   HPV VACCINES  Aged Out    Health Maintenance  Health Maintenance Due  Topic Date Due   COVID-19 Vaccine (5 - Pfizer series) 06/07/2021   INFLUENZA VACCINE  07/01/2022    Colorectal cancer screening: No longer required.   Lung Cancer Screening: (Low Dose CT Chest recommended if Age 40-80 years, 30 pack-year currently smoking OR have quit w/in 15years.) does not qualify.   Lung Cancer Screening Referral: no  Additional Screening:  Hepatitis C Screening: does not qualify;   Vision Screening: Recommended annual ophthalmology exams for early detection of glaucoma and other disorders of the eye. Is the patient up to date with their annual eye exam?  Yes  Who is the provider or what is the name of the office in which the patient attends annual eye exams? [facility] If pt is not established with a provider, would they like to be referred to a provider to establish care? No .   Dental Screening: Recommended annual dental exams for proper oral  hygiene  Community Resource Referral / Chronic Care Management: CRR required this visit?  No   CCM required this visit?  No      Plan:     I have personally reviewed and noted the following in the patient's  chart:   Medical and social history Use of alcohol, tobacco or illicit drugs  Current medications and supplements including opioid prescriptions. Patient is not currently taking opioid prescriptions. Functional ability and status Nutritional status Physical activity Advanced directives List of other physicians Hospitalizations, surgeries, and ER visits in previous 12 months Vitals Screenings to include cognitive, depression, and falls Referrals and appointments  In addition, I have reviewed and discussed with patient certain preventive protocols, quality metrics, and best practice recommendations. A written personalized care plan for preventive services as well as general preventive health recommendations were provided to patient.     Barb Merino, LPN   04/05/9793   Nurse Notes: none

## 2022-09-08 DIAGNOSIS — Z23 Encounter for immunization: Secondary | ICD-10-CM | POA: Diagnosis not present

## 2022-10-24 ENCOUNTER — Other Ambulatory Visit: Payer: Self-pay

## 2022-10-24 ENCOUNTER — Inpatient Hospital Stay (HOSPITAL_BASED_OUTPATIENT_CLINIC_OR_DEPARTMENT_OTHER)
Admission: EM | Admit: 2022-10-24 | Discharge: 2022-10-30 | DRG: 308 | Disposition: A | Discharge status: Home / Self Care | Payer: Medicare Other | Attending: Internal Medicine | Admitting: Internal Medicine

## 2022-10-24 ENCOUNTER — Emergency Department (HOSPITAL_BASED_OUTPATIENT_CLINIC_OR_DEPARTMENT_OTHER): Payer: Medicare Other | Admitting: Radiology

## 2022-10-24 ENCOUNTER — Encounter (HOSPITAL_COMMUNITY): Payer: Self-pay

## 2022-10-24 ENCOUNTER — Ambulatory Visit: Payer: Self-pay

## 2022-10-24 DIAGNOSIS — I4891 Unspecified atrial fibrillation: Secondary | ICD-10-CM

## 2022-10-24 DIAGNOSIS — R7989 Other specified abnormal findings of blood chemistry: Secondary | ICD-10-CM | POA: Diagnosis present

## 2022-10-24 DIAGNOSIS — N400 Enlarged prostate without lower urinary tract symptoms: Secondary | ICD-10-CM | POA: Diagnosis present

## 2022-10-24 DIAGNOSIS — N1832 Chronic kidney disease, stage 3b: Secondary | ICD-10-CM | POA: Diagnosis present

## 2022-10-24 DIAGNOSIS — I447 Left bundle-branch block, unspecified: Secondary | ICD-10-CM | POA: Diagnosis present

## 2022-10-24 DIAGNOSIS — I10 Essential (primary) hypertension: Secondary | ICD-10-CM | POA: Diagnosis present

## 2022-10-24 DIAGNOSIS — I130 Hypertensive heart and chronic kidney disease with heart failure and stage 1 through stage 4 chronic kidney disease, or unspecified chronic kidney disease: Secondary | ICD-10-CM | POA: Diagnosis not present

## 2022-10-24 DIAGNOSIS — I5023 Acute on chronic systolic (congestive) heart failure: Secondary | ICD-10-CM | POA: Diagnosis present

## 2022-10-24 DIAGNOSIS — I5043 Acute on chronic combined systolic (congestive) and diastolic (congestive) heart failure: Secondary | ICD-10-CM

## 2022-10-24 DIAGNOSIS — J90 Pleural effusion, not elsewhere classified: Secondary | ICD-10-CM | POA: Diagnosis not present

## 2022-10-24 DIAGNOSIS — I080 Rheumatic disorders of both mitral and aortic valves: Secondary | ICD-10-CM | POA: Diagnosis present

## 2022-10-24 DIAGNOSIS — Z79899 Other long term (current) drug therapy: Secondary | ICD-10-CM

## 2022-10-24 DIAGNOSIS — N179 Acute kidney failure, unspecified: Secondary | ICD-10-CM | POA: Diagnosis not present

## 2022-10-24 DIAGNOSIS — J189 Pneumonia, unspecified organism: Secondary | ICD-10-CM | POA: Diagnosis present

## 2022-10-24 DIAGNOSIS — J9601 Acute respiratory failure with hypoxia: Secondary | ICD-10-CM | POA: Diagnosis present

## 2022-10-24 DIAGNOSIS — R0602 Shortness of breath: Secondary | ICD-10-CM | POA: Diagnosis not present

## 2022-10-24 DIAGNOSIS — Z9981 Dependence on supplemental oxygen: Secondary | ICD-10-CM | POA: Diagnosis not present

## 2022-10-24 DIAGNOSIS — R000 Tachycardia, unspecified: Secondary | ICD-10-CM | POA: Diagnosis not present

## 2022-10-24 DIAGNOSIS — D631 Anemia in chronic kidney disease: Secondary | ICD-10-CM | POA: Diagnosis present

## 2022-10-24 DIAGNOSIS — E872 Acidosis, unspecified: Secondary | ICD-10-CM | POA: Diagnosis not present

## 2022-10-24 DIAGNOSIS — I272 Pulmonary hypertension, unspecified: Secondary | ICD-10-CM | POA: Diagnosis not present

## 2022-10-24 DIAGNOSIS — I509 Heart failure, unspecified: Secondary | ICD-10-CM

## 2022-10-24 DIAGNOSIS — Z1152 Encounter for screening for COVID-19: Secondary | ICD-10-CM

## 2022-10-24 DIAGNOSIS — J9811 Atelectasis: Secondary | ICD-10-CM | POA: Diagnosis not present

## 2022-10-24 DIAGNOSIS — E785 Hyperlipidemia, unspecified: Secondary | ICD-10-CM | POA: Diagnosis present

## 2022-10-24 DIAGNOSIS — E1122 Type 2 diabetes mellitus with diabetic chronic kidney disease: Secondary | ICD-10-CM | POA: Diagnosis not present

## 2022-10-24 DIAGNOSIS — I480 Paroxysmal atrial fibrillation: Secondary | ICD-10-CM | POA: Diagnosis present

## 2022-10-24 DIAGNOSIS — I4819 Other persistent atrial fibrillation: Secondary | ICD-10-CM | POA: Diagnosis not present

## 2022-10-24 DIAGNOSIS — Z8679 Personal history of other diseases of the circulatory system: Secondary | ICD-10-CM | POA: Diagnosis not present

## 2022-10-24 DIAGNOSIS — I5042 Chronic combined systolic (congestive) and diastolic (congestive) heart failure: Secondary | ICD-10-CM | POA: Diagnosis not present

## 2022-10-24 DIAGNOSIS — N189 Chronic kidney disease, unspecified: Secondary | ICD-10-CM | POA: Diagnosis present

## 2022-10-24 DIAGNOSIS — I340 Nonrheumatic mitral (valve) insufficiency: Secondary | ICD-10-CM | POA: Diagnosis not present

## 2022-10-24 DIAGNOSIS — N183 Chronic kidney disease, stage 3 unspecified: Secondary | ICD-10-CM | POA: Diagnosis present

## 2022-10-24 DIAGNOSIS — I159 Secondary hypertension, unspecified: Secondary | ICD-10-CM | POA: Diagnosis not present

## 2022-10-24 HISTORY — DX: Unspecified atrial fibrillation: I48.91

## 2022-10-24 LAB — BRAIN NATRIURETIC PEPTIDE: B Natriuretic Peptide: 721.7 pg/mL — ABNORMAL HIGH (ref 0.0–100.0)

## 2022-10-24 LAB — BASIC METABOLIC PANEL
Anion gap: 14 (ref 5–15)
BUN: 62 mg/dL — ABNORMAL HIGH (ref 8–23)
CO2: 17 mmol/L — ABNORMAL LOW (ref 22–32)
Calcium: 9.4 mg/dL (ref 8.9–10.3)
Chloride: 109 mmol/L (ref 98–111)
Creatinine, Ser: 2.41 mg/dL — ABNORMAL HIGH (ref 0.61–1.24)
GFR, Estimated: 26 mL/min — ABNORMAL LOW (ref >60)
Glucose, Bld: 111 mg/dL — ABNORMAL HIGH (ref 70–99)
Potassium: 5 mmol/L (ref 3.5–5.1)
Sodium: 140 mmol/L (ref 135–145)

## 2022-10-24 LAB — TROPONIN I (HIGH SENSITIVITY)
Troponin I (High Sensitivity): 61 ng/L — ABNORMAL HIGH (ref <18)
Troponin I (High Sensitivity): 57 ng/L — ABNORMAL HIGH (ref <18)

## 2022-10-24 LAB — LACTIC ACID, PLASMA: Lactic Acid, Venous: 1.2 mmol/L (ref 0.5–1.9)

## 2022-10-24 LAB — CBC
HCT: 31 % — ABNORMAL LOW (ref 39.0–52.0)
Hemoglobin: 10 g/dL — ABNORMAL LOW (ref 13.0–17.0)
MCH: 29.1 pg (ref 26.0–34.0)
MCHC: 32.3 g/dL (ref 30.0–36.0)
MCV: 90.1 fL (ref 80.0–100.0)
Platelets: 263 10*3/uL (ref 150–400)
RBC: 3.44 MIL/uL — ABNORMAL LOW (ref 4.22–5.81)
RDW: 15.4 % (ref 11.5–15.5)
WBC: 6.3 10*3/uL (ref 4.0–10.5)
nRBC: 0 % (ref 0.0–0.2)

## 2022-10-24 LAB — D-DIMER, QUANTITATIVE: D-Dimer, Quant: 6.07 ug/mL-FEU — ABNORMAL HIGH (ref 0.00–0.50)

## 2022-10-24 LAB — SARS CORONAVIRUS 2 BY RT PCR: SARS Coronavirus 2 by RT PCR: NEGATIVE

## 2022-10-24 MED ORDER — HEPARIN (PORCINE) 25000 UT/250ML-% IV SOLN
950.0000 [IU]/h | INTRAVENOUS | Status: DC
  Administered 2022-10-24 – 2022-10-25 (×2): 950 [IU]/h via INTRAVENOUS
  Filled 2022-10-24 (×2): qty 250

## 2022-10-24 MED ORDER — HEPARIN BOLUS VIA INFUSION
3400.0000 [IU] | Freq: Once | INTRAVENOUS | Status: AC
  Administered 2022-10-24: 3400 [IU] via INTRAVENOUS

## 2022-10-24 MED ORDER — ASPIRIN 81 MG PO CHEW
324.0000 mg | CHEWABLE_TABLET | Freq: Once | ORAL | Status: AC
  Administered 2022-10-24: 324 mg via ORAL
  Filled 2022-10-24: qty 4

## 2022-10-24 MED ORDER — DILTIAZEM HCL 25 MG/5ML IV SOLN
5.0000 mg | Freq: Once | INTRAVENOUS | Status: AC
  Administered 2022-10-24: 5 mg via INTRAVENOUS
  Filled 2022-10-24: qty 5

## 2022-10-24 MED ORDER — SODIUM CHLORIDE 0.9 % IV SOLN
500.0000 mg | Freq: Once | INTRAVENOUS | Status: AC
  Administered 2022-10-24: 500 mg via INTRAVENOUS
  Filled 2022-10-24: qty 5

## 2022-10-24 MED ORDER — SODIUM CHLORIDE 0.9 % IV SOLN
1.0000 g | Freq: Once | INTRAVENOUS | Status: AC
  Administered 2022-10-24: 1 g via INTRAVENOUS
  Filled 2022-10-24: qty 10

## 2022-10-24 MED ORDER — DILTIAZEM LOAD VIA INFUSION
10.0000 mg | Freq: Once | INTRAVENOUS | Status: AC
  Administered 2022-10-24: 10 mg via INTRAVENOUS
  Filled 2022-10-24: qty 10

## 2022-10-24 MED ORDER — DILTIAZEM HCL-DEXTROSE 125-5 MG/125ML-% IV SOLN (PREMIX)
5.0000 mg/h | INTRAVENOUS | Status: DC
  Administered 2022-10-24 – 2022-10-26 (×4): 5 mg/h via INTRAVENOUS
  Filled 2022-10-24 (×3): qty 125

## 2022-10-24 MED ORDER — DILTIAZEM HCL 25 MG/5ML IV SOLN
10.0000 mg | Freq: Once | INTRAVENOUS | Status: AC
  Administered 2022-10-24: 10 mg via INTRAVENOUS
  Filled 2022-10-24: qty 5

## 2022-10-24 NOTE — Telephone Encounter (Signed)
  Chief Complaint: Shortness of breath with exertion Symptoms: above Frequency: 2 days  Pertinent Negatives: Patient denies Chest pain cough leg swelling Disposition: [] ED /[x] Urgent Care (no appt availability in office) / [] Appointment(In office/virtual)/ []  [place] [facility]/ [] Home Care/ [] Refused Recommended Disposition /[] [place] [facility]/ []  Follow-up with PCP Additional Notes: PT has experienced SOB with exertion. PT states no heart or lung issues. No chest pain or numbness.  Reason for Disposition  [1] MILD difficulty breathing (e.g., minimal/no SOB at rest, SOB with walking, pulse <100) AND [2] NEW-onset or WORSE than normal  Answer Assessment - Initial Assessment Questions 1. RESPIRATORY STATUS: "Describe your breathing?" (e.g., wheezing, shortness of breath, unable to speak, severe coughing)      A day or 2 2. ONSET: "When did this breathing problem begin?"      Yesterday 3. PATTERN "Does the difficult breathing come and go, or has it been constant since it started?"      Off and on 4. SEVERITY: "How bad is your breathing?" (e.g., mild, moderate, severe)    - MILD: No SOB at rest, mild SOB with walking, speaks normally in sentences, can lie down, no retractions, pulse < 100.    - MODERATE: SOB at rest, SOB with minimal exertion and prefers to sit, cannot lie down flat, speaks in phrases, mild retractions, audible wheezing, pulse 100-120.    - SEVERE: Very SOB at rest, speaks in single words, struggling to breathe, sitting hunched forward, retractions, pulse > 120      Moderate 5. RECURRENT SYMPTOM: "Have you had difficulty breathing before?" If Yes, ask: "When was the last time?" and "What happened that time?"      no 6. CARDIAC HISTORY: "Do you have any history of heart disease?" (e.g., heart attack, angina, bypass surgery, angioplasty)      no 7. LUNG HISTORY: "Do you have any history of lung disease?"  (e.g., pulmonary embolus, asthma, emphysema)no     8."What do  you think is causing the breathing problem?"      Unknown 9. OTHER SYMPTOMS: "Do you have any other symptoms? (e.g., dizziness, runny nose, cough, chest pain, fever)     no 10. O2 SATURATION MONITOR:  "Do you use an oxygen saturation monitor (pulse oximeter) at home?" If Yes, ask: "What is your reading (oxygen level) today?" "What is your usual oxygen saturation reading?" (e.g., 95%)        11. PREGNANCY: "Is there any chance you are pregnant?" "When was your last menstrual period?"        12. TRAVEL: "Have you traveled out of the country in the last month?" (e.g., travel history, exposures)       no  Protocols used: Breathing Difficulty-A-AH

## 2022-10-24 NOTE — ED Provider Notes (Signed)
[facility] EMERGENCY DEPT Provider Note   CSN: [id number] Arrival date & time: 10/24/22  1535     History  Chief Complaint  Patient presents with   Shortness of Breath    Douglas Matthews is a 87 y.o. male.  Patient with a history of hypertension and hyperlipidemia and BPH presenting with shortness of breath progressively worsening over the past 2 days.  Worse with exertion, better with rest.  Able to lie flat.  No cough or fever.  No chest pain.  No history of CAD, stents, CHF, asthma.  No leg pain or leg swelling.  Normal able to lie flat without difficulty and still able to do so.  Does not take any diuretics at home.  Denies any history of URI symptoms or fever.  Tachycardic on arrival with reports no history of same  The history is provided by the patient and the spouse.  Shortness of Breath Associated symptoms: no abdominal pain, no fever, no headaches, no rash and no vomiting        Home Medications Prior to Admission medications   Medication Sig Start Date End Date Taking? Authorizing Provider  amLODipine (NORVASC) 10 MG tablet Take 10 mg by mouth daily.    [provider]  COD LIVER OIL PO Take by mouth.    [provider]  GARLIC PO Take by mouth.    [provider]  Multiple Vitamin (MULTIVITAMIN) capsule Take 1 capsule by mouth daily. One a day    [provider]  saw palmetto 500 MG capsule Take 500 mg by mouth daily.    [provider]      Allergies    Patient has no known allergies.    Review of Systems   Review of Systems  Constitutional:  Negative for activity change, appetite change and fever.  HENT:  Negative for congestion.   Respiratory:  Positive for shortness of breath. Negative for chest tightness.   Cardiovascular:  Positive for palpitations. Negative for leg swelling.  Gastrointestinal:  Negative for abdominal pain, nausea and vomiting.  Genitourinary:  Negative for dysuria and  hematuria.  Musculoskeletal:  Negative for arthralgias and myalgias.  Skin:  Negative for rash.  Neurological:  Negative for weakness and headaches.    all other systems are negative except as noted in the HPI and PMH.   Physical Exam Updated Vital Signs BP (!) 159/103 (BP Location: Right Arm)   Pulse (!) 137   Temp (!) 97.3 F (36.3 C)   Resp 15   Ht 5\' 8"  (1.727 m)   Wt 68 kg   SpO2 97%   BMI 22.81 kg/m  Physical Exam Vitals and nursing note reviewed.  Constitutional:      General: He is in acute distress.     Appearance: He is well-developed.     Comments: Mild dyspnea with conversation  HENT:     Head: Normocephalic and atraumatic.     Mouth/Throat:     Pharynx: No oropharyngeal exudate.  Eyes:     Conjunctiva/sclera: Conjunctivae normal.     Pupils: Pupils are equal, round, and reactive to light.  Neck:     Comments: No meningismus. Cardiovascular:     Rate and Rhythm: Regular rhythm. Tachycardia present.     Heart sounds: Normal heart sounds. No murmur heard. Pulmonary:     Effort: Pulmonary effort is normal. No respiratory distress.     Breath sounds: Normal breath sounds.  Abdominal:     Palpations:  Abdomen is soft.     Tenderness: There is no abdominal tenderness. There is no guarding or rebound.  Musculoskeletal:        General: No tenderness. Normal range of motion.     Cervical back: Normal range of motion and neck supple.     Right lower leg: No edema.     Left lower leg: No edema.  Skin:    General: Skin is warm.  Neurological:     Mental Status: He is alert and oriented to person, place, and time.     Cranial Nerves: No cranial nerve deficit.     Motor: No abnormal muscle tone.     Coordination: Coordination normal.     Comments: No ataxia on finger to nose bilaterally. No pronator drift. 5/5 strength throughout. CN 2-12 intact.Equal grip strength. Sensation intact.   Psychiatric:        Behavior: Behavior normal.     ED Results / Procedures  / Treatments   Labs (all labs ordered are listed, but only abnormal results are displayed) Labs Reviewed  BASIC METABOLIC PANEL - Abnormal; Notable for the following components:      Result Value   CO2 17 (*)    Glucose, Bld 111 (*)    BUN 62 (*)    Creatinine, Ser 2.41 (*)    GFR, Estimated 26 (*)    All other components within normal limits  CBC - Abnormal; Notable for the following components:   RBC 3.44 (*)    Hemoglobin 10.0 (*)    HCT 31.0 (*)    All other components within normal limits  BRAIN NATRIURETIC PEPTIDE - Abnormal; Notable for the following components:   B Natriuretic Peptide 721.7 (*)    All other components within normal limits  D-DIMER, QUANTITATIVE - Abnormal; Notable for the following components:   D-Dimer, Quant 6.07 (*)    All other components within normal limits  TROPONIN I (HIGH SENSITIVITY) - Abnormal; Notable for the following components:   Troponin I (High Sensitivity) 61 (*)    All other components within normal limits  CULTURE, BLOOD (ROUTINE X 2)  CULTURE, BLOOD (ROUTINE X 2)  SARS CORONAVIRUS 2 BY RT PCR  LACTIC ACID, PLASMA  LACTIC ACID, PLASMA  HEPARIN LEVEL (UNFRACTIONATED)  HEPARIN LEVEL (UNFRACTIONATED)  CBC  TROPONIN I (HIGH SENSITIVITY)    EKG EKG Interpretation  Date/Time:  Friday October 24 2022 15:42:16 EST Ventricular Rate:  136 PR Interval:  112 QRS Duration: 116 QT Interval:  330 QTC Calculation: 496 R Axis:   73 Text Interpretation: Sinus tachycardia Left ventricular hypertrophy with QRS widening and repolarization abnormality ( Cornell product ) Anterior infarct , age undetermined Abnormal ECG When compared with ECG of 09-Dec-2012 17:09, Significant changes have occurred new LBBB Confirmed by Glynn Octave [phone]) on 10/24/2022 3:58:25 PM  Radiology DG Chest Port 1 View  Result Date: 10/24/2022 CLINICAL DATA:  Shortness of breath. EXAM: PORTABLE CHEST 1 VIEW COMPARISON:  None Available. FINDINGS: The heart is  enlarged. There is focal patchy density at the LATERAL RIGHT lung base, consistent with infectious or inflammatory process. There is minimal atelectasis in the LEFT lung base. No evidence for pulmonary edema. IMPRESSION: 1. Cardiomegaly. 2. RIGHT LOWER lobe infiltrate. Followup PA and lateral chest X-ray is recommended in 3-4 weeks following trial of antibiotic therapy to ensure resolution and exclude underlying malignancy. Electronically Signed   By: Norva Pavlov M.D.   On: 10/24/2022 16:17    Procedures .Critical Care  Performed by: Ezequiel Essex, MD Authorized by: Ezequiel Essex, MD   Critical care provider statement:    Critical care time (minutes):  45   Critical care time was exclusive of:  Separately billable procedures and treating other patients   Critical care was necessary to treat or prevent imminent or life-threatening deterioration of the following conditions: Atrial fibrillation with RVR.   Critical care was time spent personally by me on the following activities:  Development of treatment plan with patient or surrogate, discussions with consultants, evaluation of patient's response to treatment, examination of patient, ordering and review of laboratory studies, ordering and review of radiographic studies, ordering and performing treatments and interventions, pulse oximetry, re-evaluation of patient's condition and review of old charts   I assumed direction of critical care for this patient from another provider in my specialty: no     Care discussed with: admitting provider       Medications Ordered in ED Medications  diltiazem (CARDIZEM) injection 5 mg (has no administration in time range)  aspirin chewable tablet 324 mg (has no administration in time range)    ED Course/ Medical Decision Making/ A&P                           Medical Decision Making Amount and/or Complexity of Data Reviewed Independent Historian: spouse Labs: ordered. Decision-making details  documented in ED Course. Radiology: ordered and independent interpretation performed. Decision-making details documented in ED Course. ECG/medicine tests: ordered and independent interpretation performed. Decision-making details documented in ED Course.  Risk OTC drugs. Prescription drug management. Decision regarding hospitalization.   Worsening shortness of breath for the past 2 days.  No chest pain, cough or fever.  No leg pain or leg swelling.  He is tachycardic on arrival to the 140s with concern for atrial flutter.  Does have new left bundle branch block.  Denies chest pain.  Echocardiogram 2017 showed ejection fraction [id number] with diastolic dysfunction  EKG discussed with Dr. Orene Desanctis. Of cardiology.  Does not meet Sgarbossa criteria  Not consistent with STEMI.  Agree suspicious for underlying atrial flutter.  Recommends attempting adenosine or Cardizem.  After Cardizem, patient's heart rate has improved to the 100s to 110s and appears consistent with atrial fibrillation.  Labs show stable creatinine at 2.4.  D-dimer 6.0.  BNP 700.  Unable to obtain CT pulmonary embolism study due to patient's kidney function.  Will initiate empiric heparin given CKD for treatment for atrial fibrillation.  Chest x-ray concerning for right-sided pneumonia at the base.  Results reviewed interpreted by me.  Patient denies any cough or fever but will initiate antibiotic coverage after cultures are pending.  Heart rate has improved with Cardizem.  Will initiate Cardizem infusion as well as IV heparin for risk of clotting due to A-fib.  Also will treat empirically for possible PE with elevated D-dimer and not able to give CT contrast due to CKD.  Admission discussed with Dr. Sloan Leiter.         Final Clinical Impression(s) / ED Diagnoses Final diagnoses:  Atrial fibrillation with RVR [facility])    Rx / DC Orders ED Discharge Orders     None         Markie Heffernan, Annie Main, MD 10/24/22 1801

## 2022-10-24 NOTE — ED Notes (Signed)
Placed on 2 L Angie per Dr. Manus Gunning

## 2022-10-24 NOTE — Progress Notes (Signed)
ANTICOAGULATION CONSULT NOTE - Initial Consult  Pharmacy Consult for heparin infusion  Indication: atrial fibrillation  No Known Allergies  Patient Measurements: Height: 5\' 8"  (172.7 cm) Weight: 68 kg (150 lb) IBW/kg (Calculated) : 68.4 Heparin Dosing Weight: 68 kg  Vital Signs: Temp: 97.3 F (36.3 C) (11/24 1550) BP: 151/106 (11/24 1615) Pulse Rate: 137 (11/24 1615)  Labs: Recent Labs    10/24/22 1546  HGB 10.0*  HCT 31.0*  PLT 263  CREATININE 2.41*  TROPONINIHS 61*    Estimated Creatinine Clearance: 21.9 mL/min (A) (by C-G formula based on SCr of 2.41 mg/dL (H)).   Medical History: Past Medical History:  Diagnosis Date   Anemia    BPH (benign prostatic hypertrophy)    Diverticulosis    Hyperlipidemia    Hypertension     Assessment: 87 yo M presenting with SHOB x2 days. EKG suspicious for atrial flutter per MD notes. No anticoagulants on last med rec or in fill history. Pharmacy consulted to dose heparin for anticoagulation pending workup.  CBC: Hgb 10/Plt 263/Ddimer 6.07   Goal of Therapy:  Heparin level 0.3-0.7 units/ml Monitor platelets by anticoagulation protocol: Yes   Plan:  Heparin 3400 units IV x1 followed by heparin 950 units/hr 8hr HL at 0100 Daily HL, CBC F/u transition to enteral therapies as able F/u s/sx bleeding   97, PharmD Clinical Pharmacist 10/24/2022 4:58 PM

## 2022-10-24 NOTE — ED Triage Notes (Signed)
Sob started a few days ago. Worse with exertion. Denies chest pain, dizziness, nausea. No swelling noted to extremities. Ambulatory to triage.

## 2022-10-24 NOTE — ED Notes (Signed)
Called [facility], patient ready for transport  

## 2022-10-24 NOTE — ED Notes (Signed)
SBAR report given to [place], RN 6E

## 2022-10-24 NOTE — ED Notes (Signed)
Heparin bolus given on pump, not scanned. Lupita Leash RN visually confirmed and witnessed.

## 2022-10-25 ENCOUNTER — Encounter (HOSPITAL_COMMUNITY): Payer: Self-pay | Admitting: Internal Medicine

## 2022-10-25 ENCOUNTER — Inpatient Hospital Stay (HOSPITAL_COMMUNITY): Payer: Medicare Other

## 2022-10-25 DIAGNOSIS — I5042 Chronic combined systolic (congestive) and diastolic (congestive) heart failure: Secondary | ICD-10-CM

## 2022-10-25 DIAGNOSIS — I447 Left bundle-branch block, unspecified: Secondary | ICD-10-CM | POA: Diagnosis present

## 2022-10-25 DIAGNOSIS — E1122 Type 2 diabetes mellitus with diabetic chronic kidney disease: Secondary | ICD-10-CM | POA: Diagnosis present

## 2022-10-25 DIAGNOSIS — E872 Acidosis, unspecified: Secondary | ICD-10-CM | POA: Diagnosis present

## 2022-10-25 DIAGNOSIS — I5043 Acute on chronic combined systolic (congestive) and diastolic (congestive) heart failure: Secondary | ICD-10-CM | POA: Diagnosis not present

## 2022-10-25 DIAGNOSIS — J9601 Acute respiratory failure with hypoxia: Secondary | ICD-10-CM | POA: Insufficient documentation

## 2022-10-25 DIAGNOSIS — Z1152 Encounter for screening for COVID-19: Secondary | ICD-10-CM | POA: Diagnosis not present

## 2022-10-25 DIAGNOSIS — I509 Heart failure, unspecified: Secondary | ICD-10-CM

## 2022-10-25 DIAGNOSIS — R7989 Other specified abnormal findings of blood chemistry: Secondary | ICD-10-CM | POA: Diagnosis present

## 2022-10-25 DIAGNOSIS — N1832 Chronic kidney disease, stage 3b: Secondary | ICD-10-CM | POA: Diagnosis present

## 2022-10-25 DIAGNOSIS — N400 Enlarged prostate without lower urinary tract symptoms: Secondary | ICD-10-CM | POA: Diagnosis present

## 2022-10-25 DIAGNOSIS — E785 Hyperlipidemia, unspecified: Secondary | ICD-10-CM | POA: Diagnosis present

## 2022-10-25 DIAGNOSIS — I4891 Unspecified atrial fibrillation: Secondary | ICD-10-CM

## 2022-10-25 DIAGNOSIS — I080 Rheumatic disorders of both mitral and aortic valves: Secondary | ICD-10-CM | POA: Diagnosis present

## 2022-10-25 DIAGNOSIS — I272 Pulmonary hypertension, unspecified: Secondary | ICD-10-CM | POA: Diagnosis present

## 2022-10-25 DIAGNOSIS — I5023 Acute on chronic systolic (congestive) heart failure: Secondary | ICD-10-CM | POA: Diagnosis present

## 2022-10-25 DIAGNOSIS — J90 Pleural effusion, not elsewhere classified: Secondary | ICD-10-CM | POA: Diagnosis not present

## 2022-10-25 DIAGNOSIS — J189 Pneumonia, unspecified organism: Secondary | ICD-10-CM | POA: Diagnosis present

## 2022-10-25 DIAGNOSIS — D631 Anemia in chronic kidney disease: Secondary | ICD-10-CM | POA: Diagnosis present

## 2022-10-25 DIAGNOSIS — Z79899 Other long term (current) drug therapy: Secondary | ICD-10-CM | POA: Diagnosis not present

## 2022-10-25 DIAGNOSIS — N179 Acute kidney failure, unspecified: Secondary | ICD-10-CM | POA: Diagnosis present

## 2022-10-25 DIAGNOSIS — Z9981 Dependence on supplemental oxygen: Secondary | ICD-10-CM | POA: Diagnosis not present

## 2022-10-25 DIAGNOSIS — N189 Chronic kidney disease, unspecified: Secondary | ICD-10-CM | POA: Diagnosis not present

## 2022-10-25 DIAGNOSIS — I4819 Other persistent atrial fibrillation: Secondary | ICD-10-CM | POA: Diagnosis present

## 2022-10-25 DIAGNOSIS — R0602 Shortness of breath: Secondary | ICD-10-CM | POA: Diagnosis not present

## 2022-10-25 DIAGNOSIS — Z8679 Personal history of other diseases of the circulatory system: Secondary | ICD-10-CM | POA: Diagnosis not present

## 2022-10-25 DIAGNOSIS — I130 Hypertensive heart and chronic kidney disease with heart failure and stage 1 through stage 4 chronic kidney disease, or unspecified chronic kidney disease: Secondary | ICD-10-CM | POA: Diagnosis present

## 2022-10-25 DIAGNOSIS — I159 Secondary hypertension, unspecified: Secondary | ICD-10-CM | POA: Diagnosis not present

## 2022-10-25 DIAGNOSIS — I340 Nonrheumatic mitral (valve) insufficiency: Secondary | ICD-10-CM | POA: Diagnosis not present

## 2022-10-25 HISTORY — DX: Pneumonia, unspecified organism: J18.9

## 2022-10-25 LAB — ECHOCARDIOGRAM COMPLETE
AR max vel: 1.71 cm2
AV Area VTI: 1.47 cm2
AV Area mean vel: 1.59 cm2
AV Mean grad: 7 mmHg
AV Peak grad: 12 mmHg
Ao pk vel: 1.73 m/s
Calc EF: 35.7 %
Height: 68 in
MV M vel: 4.14 m/s
MV Peak grad: 68.6 mmHg
Single Plane A2C EF: 45.7 %
Single Plane A4C EF: 21.9 %
Weight: 2348.8 oz

## 2022-10-25 LAB — CBC
HCT: 28.1 % — ABNORMAL LOW (ref 39.0–52.0)
HCT: 27.1 % — ABNORMAL LOW (ref 39.0–52.0)
Hemoglobin: 9.4 g/dL — ABNORMAL LOW (ref 13.0–17.0)
Hemoglobin: 9 g/dL — ABNORMAL LOW (ref 13.0–17.0)
MCH: 29.6 pg (ref 26.0–34.0)
MCH: 29.2 pg (ref 26.0–34.0)
MCHC: 33.5 g/dL (ref 30.0–36.0)
MCHC: 33.2 g/dL (ref 30.0–36.0)
MCV: 88.4 fL (ref 80.0–100.0)
MCV: 88 fL (ref 80.0–100.0)
Platelets: 241 10*3/uL (ref 150–400)
Platelets: 235 10*3/uL (ref 150–400)
RBC: 3.18 MIL/uL — ABNORMAL LOW (ref 4.22–5.81)
RBC: 3.08 MIL/uL — ABNORMAL LOW (ref 4.22–5.81)
RDW: 15.1 % (ref 11.5–15.5)
RDW: 15.4 % (ref 11.5–15.5)
WBC: 6.3 10*3/uL (ref 4.0–10.5)
WBC: 6.3 10*3/uL (ref 4.0–10.5)
nRBC: 0 % (ref 0.0–0.2)
nRBC: 0 % (ref 0.0–0.2)

## 2022-10-25 LAB — BASIC METABOLIC PANEL
Anion gap: 12 (ref 5–15)
BUN: 57 mg/dL — ABNORMAL HIGH (ref 8–23)
CO2: 15 mmol/L — ABNORMAL LOW (ref 22–32)
Calcium: 9.1 mg/dL (ref 8.9–10.3)
Chloride: 114 mmol/L — ABNORMAL HIGH (ref 98–111)
Creatinine, Ser: 2.15 mg/dL — ABNORMAL HIGH (ref 0.61–1.24)
GFR, Estimated: 30 mL/min — ABNORMAL LOW (ref >60)
Glucose, Bld: 102 mg/dL — ABNORMAL HIGH (ref 70–99)
Potassium: 5.1 mmol/L (ref 3.5–5.1)
Sodium: 141 mmol/L (ref 135–145)

## 2022-10-25 LAB — GLUCOSE, CAPILLARY
Glucose-Capillary: 93 mg/dL (ref 70–99)
Glucose-Capillary: 110 mg/dL — ABNORMAL HIGH (ref 70–99)
Glucose-Capillary: 133 mg/dL — ABNORMAL HIGH (ref 70–99)

## 2022-10-25 LAB — HEPARIN LEVEL (UNFRACTIONATED)
Heparin Unfractionated: 0.6 IU/mL (ref 0.30–0.70)
Heparin Unfractionated: 0.56 IU/mL (ref 0.30–0.70)

## 2022-10-25 LAB — MAGNESIUM: Magnesium: 2.2 mg/dL (ref 1.7–2.4)

## 2022-10-25 LAB — TSH: TSH: 4.26 u[IU]/mL (ref 0.350–4.500)

## 2022-10-25 MED ORDER — SODIUM CHLORIDE 0.9 % IV SOLN
500.0000 mg | INTRAVENOUS | Status: DC
  Administered 2022-10-25: 500 mg via INTRAVENOUS
  Filled 2022-10-25 (×2): qty 5

## 2022-10-25 MED ORDER — SODIUM CHLORIDE 0.9 % IV SOLN
1.0000 g | INTRAVENOUS | Status: DC
  Administered 2022-10-25: 1 g via INTRAVENOUS
  Filled 2022-10-25: qty 10

## 2022-10-25 MED ORDER — ACETAMINOPHEN 325 MG PO TABS: 650.0000 mg | ORAL_TABLET | Freq: Four times a day (QID) | ORAL | Status: DC | PRN

## 2022-10-25 MED ORDER — ACETAMINOPHEN 650 MG RE SUPP: 650.0000 mg | Freq: Four times a day (QID) | RECTAL | Status: DC | PRN

## 2022-10-25 NOTE — Progress Notes (Signed)
ANTICOAGULATION CONSULT NOTE  Pharmacy Consult for heparin infusion  Indication: atrial fibrillation  No Known Allergies  Patient Measurements: Height: 5\' 8"  (172.7 cm) Weight: 66.6 kg (146 lb 12.8 oz) IBW/kg (Calculated) : 68.4 Heparin Dosing Weight: 68 kg  Vital Signs: Temp: 97.7 F (36.5 C) (11/25 0103) Temp Source: Axillary (11/25 0103) BP: 150/96 (11/25 0103) Pulse Rate: 133 (11/25 0103)  Labs: Recent Labs    10/24/22 1546 10/24/22 1830 10/25/22 0205  HGB 10.0*  --  9.4*  HCT 31.0*  --  28.1*  PLT 263  --  241  HEPARINUNFRC  --   --  0.60  CREATININE 2.41*  --   --   TROPONINIHS 61* 57*  --      Estimated Creatinine Clearance: 21.5 mL/min (A) (by C-G formula based on SCr of 2.41 mg/dL (H)).   Medical History: Past Medical History:  Diagnosis Date   Anemia    BPH (benign prostatic hypertrophy)    Diverticulosis    Hyperlipidemia    Hypertension     Assessment: 87 yo M presenting with SHOB x2 days. EKG suspicious for atrial flutter per MD notes. No anticoagulants on last med rec or in fill history. Pharmacy consulted to dose heparin for anticoagulation pending workup.  CBC: Hgb 10/Plt 263/Ddimer 6.07  11/25 AM update:  Heparin level therapeutic  Noted D-dimer elevated  Goal of Therapy:  Heparin level 0.3-0.7 units/ml Monitor platelets by anticoagulation protocol: Yes   Plan:  Cont heparin 950 units/hr 1200 heparin level  12/25, PharmD, BCPS Clinical Pharmacist Phone: [phone]

## 2022-10-25 NOTE — H&P (Signed)
History and Physical    Douglas Matthews [id number] DOB: 1935/03/07 DOA: 10/24/2022  PCP: Martinique, [name], MD  Patient coming from: [facility] ED  Chief Complaint: Shortness of breath  HPI: Douglas Matthews is a 87 y.o. male with medical history significant of hypertension, hyperlipidemia, CKD stage IIIb, BPH presented to the ED complaining of worsening shortness of breath x 2 days.  Noted to be in new onset atrial fibrillation/flutter with RVR with rate in the 140s.  EKG did show new left bundle branch block which was discussed with Dr. Sallyanne Kuster from cardiology who felt that it did not meet Sgarbossa criteria and not consistent with STEMI. Unknown how hypoxic, placed on 2-4 L O2.  Labs showing no leukocytosis, hemoglobin 9.4 (stable since labs done 6 months ago), bicarb 17, anion gap 14, glucose 111, BUN 62, creatinine 2.4 (stable), high-sensitivity troponin 61> 57, D-dimer 6.07, BNP 721, lactic acid 1.2, blood cultures drawn, SARS-CoV-2 PCR negative.  Chest x-ray showing cardiomegaly and right lower lobe infiltrate. Patient was given aspirin 324 mg, IV Cardizem bolus and started on continuous infusion, IV heparin, ceftriaxone, and azithromycin.  Transferred to [facility].  Patient reports 1 week history of dyspnea with exertion.  He denies fevers, cough, palpitations, or chest pain.  Denies orthopnea, lower extremity edema, or weight gain.  Denies leg pain/swelling, history of blood clots, recent long distance travel, or recent surgeries.  No other complaints.  Denies nausea, vomiting, abdominal pain, or diarrhea.  Review of Systems:  Review of Systems  All other systems reviewed and are negative.   Past Medical History:  Diagnosis Date   Anemia    BPH (benign prostatic hypertrophy)    Diverticulosis    Hyperlipidemia    Hypertension     Past Surgical History:  Procedure Laterality Date   APPENDECTOMY     HERNIA REPAIR       reports that he has never smoked. He has never  used smokeless tobacco. He reports that he does not drink alcohol and does not use drugs.  No Known Allergies  Family History  Problem Relation Age of Onset   Heart disease Other     Prior to Admission medications   Medication Sig Start Date End Date Taking? Authorizing Provider  amLODipine (NORVASC) 10 MG tablet Take 10 mg by mouth daily.    [provider]  COD LIVER OIL PO Take by mouth.    [provider]  GARLIC PO Take by mouth.    [provider]  Multiple Vitamin (MULTIVITAMIN) capsule Take 1 capsule by mouth daily. One a day    [provider]  saw palmetto 500 MG capsule Take 500 mg by mouth daily.    [provider]    Physical Exam: Vitals:   10/24/22 2300 10/25/22 0000 10/25/22 0008 10/25/22 0103  BP: (!) 159/109 (!) 143/86 (!) 143/86 (!) 150/96  Pulse: (!) 44 60 (!) 57 (!) 133  Resp: 17 20 14 18   Temp:   97.8 F (36.6 C) 97.7 F (36.5 C)  TempSrc:    Axillary  SpO2: 100% 100% 98% 96%  Weight:    66.6 kg  Height:    5\' 8"  (1.727 m)    Physical Exam Vitals reviewed.  Constitutional:      General: He is not in acute distress. HENT:     Head: Normocephalic and atraumatic.  Eyes:     Extraocular Movements: Extraocular movements intact.  Cardiovascular:     Rate  and Rhythm: Normal rate. Rhythm irregular.     Pulses: Normal pulses.  Pulmonary:     Effort: Pulmonary effort is normal. No respiratory distress.     Breath sounds: No wheezing or rales.  Abdominal:     General: Bowel sounds are normal. There is no distension.     Palpations: Abdomen is soft.     Tenderness: There is no abdominal tenderness.  Musculoskeletal:     Cervical back: Normal range of motion.     Right lower leg: No edema.     Left lower leg: No edema.  Skin:    General: Skin is warm and dry.  Neurological:     General: No focal deficit present.     Mental Status: He is alert and oriented to person, place, and time.     Labs on  Admission: I have personally reviewed following labs and imaging studies  CBC: Recent Labs  Lab 10/24/22 1546 10/25/22 0205  WBC 6.3 6.3  HGB 10.0* 9.4*  HCT 31.0* 28.1*  MCV 90.1 88.4  PLT 263 [id number]   Basic Metabolic Panel: Recent Labs  Lab 10/24/22 1546  NA 140  K 5.0  CL 109  CO2 17*  GLUCOSE 111*  BUN 62*  CREATININE 2.41*  CALCIUM 9.4   GFR: Estimated Creatinine Clearance: 21.5 mL/min (A) (by C-G formula based on SCr of 2.41 mg/dL (H)). Liver Function Tests: No results for input(s): "AST", "ALT", "ALKPHOS", "BILITOT", "PROT", "ALBUMIN" in the last 168 hours. No results for input(s): "LIPASE", "AMYLASE" in the last 168 hours. No results for input(s): "AMMONIA" in the last 168 hours. Coagulation Profile: No results for input(s): "INR", "PROTIME" in the last 168 hours. Cardiac Enzymes: No results for input(s): "CKTOTAL", "CKMB", "CKMBINDEX", "TROPONINI" in the last 168 hours. BNP (last 3 results) No results for input(s): "PROBNP" in the last 8760 hours. HbA1C: No results for input(s): "HGBA1C" in the last 72 hours. CBG: No results for input(s): "GLUCAP" in the last 168 hours. Lipid Profile: No results for input(s): "CHOL", "HDL", "LDLCALC", "TRIG", "CHOLHDL", "LDLDIRECT" in the last 72 hours. Thyroid Function Tests: No results for input(s): "TSH", "T4TOTAL", "FREET4", "T3FREE", "THYROIDAB" in the last 72 hours. Anemia Panel: No results for input(s): "VITAMINB12", "FOLATE", "FERRITIN", "TIBC", "IRON", "RETICCTPCT" in the last 72 hours. Urine analysis:    Component Value Date/Time   COLORURINE yellow 09/19/2010 1013   APPEARANCEUR Clear 09/19/2010 1013   LABSPEC 1.015 09/19/2010 1013   PHURINE 5.5 09/19/2010 1013   HGBUR negative 09/19/2010 1013   BILIRUBINUR n 04/26/2015 1054   PROTEINUR n 04/26/2015 1054   UROBILINOGEN 0.2 04/26/2015 1054   UROBILINOGEN 0.2 09/19/2010 1013   NITRITE n 04/26/2015 1054   NITRITE negative 09/19/2010 1013   LEUKOCYTESUR  Negative 04/26/2015 1054    Radiological Exams on Admission: DG Chest Port 1 View  Result Date: 10/24/2022 CLINICAL DATA:  Shortness of breath. EXAM: PORTABLE CHEST 1 VIEW COMPARISON:  None Available. FINDINGS: The heart is enlarged. There is focal patchy density at the LATERAL RIGHT lung base, consistent with infectious or inflammatory process. There is minimal atelectasis in the LEFT lung base. No evidence for pulmonary edema. IMPRESSION: 1. Cardiomegaly. 2. RIGHT LOWER lobe infiltrate. Followup PA and lateral chest X-ray is recommended in 3-4 weeks following trial of antibiotic therapy to ensure resolution and exclude underlying malignancy. Electronically Signed   By: Nolon Nations M.D.   On: 10/24/2022 16:17    Assessment and Plan  New onset A-fib/flutter with RVR Rate initially  in the 140s in the ED and was placed on Cardizem gtt. Currently in A-fib rate in the 90s.  CHA2DS2-VASc 3.  Infection could be a possible precipitating factor. ?PE given elevated D-dimer. -Cardiac monitoring -Continue Cardizem gtt -Continue IV heparin -Echocardiogram -Check TSH and mag -Unable to obtain CT angiogram to rule out PE given CKD.  VQ scan has been ordered. -Consult cardiology in a.m.  Community-acquired pneumonia Acute hypoxemic respiratory failure Chest x-ray showing right lower lobe infiltrate.  No fever, leukocytosis, or lactic acidosis.  SARS-CoV-2 PCR negative.  Patient was placed on supplemental oxygen in the ED and unknown how low his oxygen saturation was on room air.  Currently stable on 2 L O2. -Continue ceftriaxone and azithromycin -Blood cultures pending -Continue supplemental oxygen, wean as tolerated  Acute on chronic CHF BNP 721 and chest x-ray showing cardiomegaly but no pulmonary edema.  No peripheral edema on exam.  Previous echo from June 2017 showing EF 50% and grade 1 diastolic dysfunction. -Repeat echocardiogram -Consult cardiology in a.m.  New LBBB Troponin elevated  but stable (61> 57) and not consistent with ACS.  Patient is not endorsing chest pain. -Echocardiogram  Hypertension Systolic currently in the 140s to 150s on Cardizem gtt. -Continue Cardizem gtt and monitor blood pressure closely  CKD stage IIIb Metabolic acidosis Creatinine 2.4 (stable), bicarb 17. -Monitor BMP and replace bicarb if metabolic acidosis worsens.  Chronic anemia Hemoglobin stable and no signs of active bleeding. -Continue to monitor  DVT prophylaxis: IV heparin gtt Code Status: Full Code (discussed with the patient) Family Communication: No family available at this time. Admission status: It is my clinical opinion that admission to INPATIENT is reasonable and necessary because of the expectation that this patient will require hospital care that crosses at least 2 midnights to treat this condition based on the medical complexity of the problems presented.  Given the aforementioned information, the predictability of an adverse outcome is felt to be significant.   John Giovanni MD [facility]  If 7PM-7AM, please contact night-coverage [email or web address]  10/25/2022, 3:54 AM

## 2022-10-25 NOTE — Progress Notes (Signed)
Patient was seen and examined.  Wife at the bedside.  Admitted early morning hours by nighttime hospitalist with about 1 week of shortness of breath without fever or sputum production.  Found to have right lower lobe pneumonia and along with rapid A-fib in the emergency room.  Admitted with heparin infusion, Cardizem infusion and antibiotics.  Currently hemodynamically stable.  Rate controlled.  Followed by cardiology.  Anticipating conversion to oral anticoagulation with Eliquis and oral Cardizem after echocardiogram.  Will follow cardiology recommendations.  For pneumonia continue Rocephin and azithromycin.  Blood cultures negative so far.  Obtain urine cultures, Legionella and strep pneumonia antigen.  Mobilize in the hallway.   Same-day admit.  No charge visit.

## 2022-10-25 NOTE — Progress Notes (Addendum)
ANTICOAGULATION CONSULT NOTE-Follow Up  Pharmacy Consult for heparin infusion  Indication: atrial fibrillation  No Known Allergies  Patient Measurements: Height: 5\' 8"  (172.7 cm) Weight: 66.6 kg (146 lb 12.8 oz) IBW/kg (Calculated) : 68.4 Heparin Dosing Weight: 68 kg  Vital Signs: Temp: 97.5 F (36.4 C) (11/25 1213) Temp Source: Axillary (11/25 1213) BP: 153/103 (11/25 1213) Pulse Rate: 52 (11/25 1213)  Labs: Recent Labs    10/24/22 1546 10/24/22 1830 10/25/22 0205 10/25/22 0615 10/25/22 1234  HGB 10.0*  --  9.4* 9.0*  --   HCT 31.0*  --  28.1* 27.1*  --   PLT 263  --  241 235  --   HEPARINUNFRC  --   --  0.60  --  0.56  CREATININE 2.41*  --   --  2.15*  --   TROPONINIHS 61* 57*  --   --   --      Estimated Creatinine Clearance: 24.1 mL/min (A) (by C-G formula based on SCr of 2.15 mg/dL (H)).   Medical History: Past Medical History:  Diagnosis Date   Anemia    BPH (benign prostatic hypertrophy)    Diverticulosis    Hyperlipidemia    Hypertension     Assessment: 87 yo M presenting with SHOB x2 days. EKG suspicious for atrial flutter per MD notes. No anticoagulants on last med rec or in fill history. Pharmacy consulted to dose heparin for anticoagulation pending workup.  CBC: Hgb 9/Plt 235/Ddimer 6.07  11/25 update:  Heparin level therapeutic (0.56) and Per RN no issues with infusion or signs/symptoms of bleeding  Goal of Therapy:  Heparin level 0.3-0.7 units/ml Monitor platelets by anticoagulation protocol: Yes   Plan:  Cont heparin 950 units/hr Monitor CBC and heparin level daily Monitor signs/symptoms of bleeding  12/25, PharmD. [facility] Acute Care PGY-1  10/25/2022 1:14 PM

## 2022-10-25 NOTE — Consult Note (Addendum)
Cardiology Consultation   Patient ID: Douglas Matthews MRN: [id number]; DOB: 10-09-1935  Admit date: 10/24/2022 Date of Consult: 10/25/2022  PCP:  [place], [name], MD   [facility] Providers Cardiologist:  New to Douglas Matthews    Patient Profile:   Douglas Matthews is a 87 y.o. male with [name] of HTN, HLD, BPH  who is being seen 10/25/2022 for the evaluation of atrial fib/flutter with RVR on admit for PNA, hypoxemia and CHF, new LBBB  at the request of Dr. Loney Loh.  History of Present Illness:   Douglas Matthews, and CKD 3b, presented to ER 10/24/22 with SOB for 2 days.  No history of CAD and sister may have atrial fib.   On arrival was in atrial flutter with RVR and LVH, new LBBB.  CXR with cardiomegaly, RLL infiltrate.  Concern for STEMI but did not meet Sgarbossa criteria, neg for STEMI.  His sp02 was decreased to 92% and 02 added.  No awareness of atrial fib, no chest pain.  Rate is controlled.    Hgb 9.4 stable for 6 months, BUN 62 and Cr 2.4 stable  Hs troponin 61 and 57 Ddimer 6.07   VQ scan has been ordered BNP 721     SARS-CoV-2 PCR negative   In ER pt received ASA 324, IV dilt and drip, IV heparin, and ABX.  Transferred to [facility] from [place].     EKG:  The EKG was personally reviewed and demonstrates:  atrial flutter with LVH  LBBB Telemetry:  Telemetry was personally reviewed and demonstrates:  atrial fib rate controlled   BP 131/92 to 147/69  P 84 R 16  Sp02 96% afebrile  Dilt drip Hep drip  Past Medical History:  Diagnosis Date   Anemia    BPH (benign prostatic hypertrophy)    Diverticulosis    Hyperlipidemia    Hypertension     Past Surgical History:  Procedure Laterality Date   APPENDECTOMY     HERNIA REPAIR       Home Medications:  Prior to Admission medications   Medication Sig Start Date End Date Taking? Authorizing Provider  amLODipine (NORVASC) 10 MG tablet Take 10 mg by mouth daily.   Yes [provider]   COD LIVER OIL PO Take 1 tablet by mouth daily.   Yes [provider]  GARLIC PO Take 1 tablet by mouth daily.   Yes [provider]  Multiple Vitamin (MULTIVITAMIN) capsule Take 1 capsule by mouth daily.   Yes [provider]  Saw Palmetto 450 MG CAPS Take 900 mg by mouth daily.   Yes [provider]    Inpatient Medications: Scheduled Meds:  Continuous Infusions:  azithromycin (ZITHROMAX) 500 mg in sodium chloride 0.9 % 250 mL IVPB     cefTRIAXone (ROCEPHIN)  IV     diltiazem (CARDIZEM) infusion 0.0735 mg/kg/hr (10/25/22 0259)   heparin 950 Units/hr (10/25/22 0259)   PRN Meds: acetaminophen **OR** acetaminophen  Allergies:   No Known Allergies  Social History:   Social History   Socioeconomic History   Marital status: Married    Spouse name: Not on file   Number of children: Not on file   Years of education: Not on file   Highest education level: Not on file  Occupational History   Occupation: Retired  Tobacco Use   Smoking status: Never   Smokeless tobacco: Never  Vaping Use   Vaping Use: Never used  Substance and  Sexual Activity   Alcohol use: No   Drug use: No   Sexual activity: Not on file  Other Topics Concern   Not on file  Social History Narrative   Married x 50 years    Social Determinants of Health   Financial Resource Strain: Low Risk  (07/08/2022)   Overall Financial Resource Strain (CARDIA)    Difficulty of Paying Living Expenses: Not hard at all  Food Insecurity: No Food Insecurity (10/25/2022)   Hunger Vital Sign    Worried About Running Out of Food in the Last Year: Never true    Ran Out of Food in the Last Year: Never true  Transportation Needs: No Transportation Needs (10/25/2022)   PRAPARE - Hydrologist (Medical): No    Lack of Transportation (Non-Medical): No  Physical Activity: Insufficiently Active (07/08/2022)   Exercise Vital Sign    Days of Exercise per Week: 2 days     Minutes of Exercise per Session: 30 min  Stress: No Stress Concern Present (07/08/2022)   [place]    Feeling of Stress : Not at all  Social Connections: Moderately Integrated (07/02/2021)   Social Connection and Isolation Panel [NHANES]    Frequency of Communication with Friends and Family: Three times a week    Frequency of Social Gatherings with Friends and Family: Three times a week    Attends Religious Services: More than 4 times per year    Active Member of Clubs or Organizations: No    Attends Archivist Meetings: Never    Marital Status: Married  Human resources officer Violence: Not At Risk (10/25/2022)   Humiliation, Afraid, Rape, and Kick questionnaire    Fear of Current or Ex-Partner: No    Emotionally Abused: No    Physically Abused: No    Sexually Abused: No    Family History:    Family History  Problem Relation Age of Onset   Heart disease Other      ROS:  Please see the history of present illness.  General:no colds or fevers, no weight changes Skin:no rashes or ulcers HEENT:no blurred vision, no congestion CV:see HPI PUL:see HPI GI:no diarrhea constipation or melena, no indigestion GU:no hematuria, no dysuria MS:no joint pain, no claudication Neuro:no syncope, no lightheadedness Endo:no diabetes, no thyroid disease  All other ROS reviewed and negative.     Physical Exam/Data:   Vitals:   10/25/22 0008 10/25/22 0103 10/25/22 0421 10/25/22 0737  BP: (!) 143/86 (!) 150/96 (!) 150/91 (!) 131/92  Pulse: (!) 57 (!) 133 67 61  Resp: 14 18 18 16   Temp: 97.8 F (36.6 C) 97.7 F (36.5 C) 98.1 F (36.7 C) 98.4 F (36.9 C)  TempSrc:  Axillary Axillary Axillary  SpO2: 98% 96% 97% 96%  Weight:  66.6 kg    Height:  5\' 8"  (1.727 m)      Intake/Output Summary (Last 24 hours) at 10/25/2022 0901 Last data filed at 10/25/2022 0259 Gross per 24 hour  Intake 521.2 ml  Output --  Net 521.2 ml       10/25/2022    1:03 AM 10/24/2022    3:40 PM 07/08/2022    2:32 PM  Last 3 Weights  Weight (lbs) 146 lb 12.8 oz 150 lb 150 lb 4.8 oz  Weight (kg) 66.588 kg 68.04 kg 68.176 kg     Body mass index is 22.32 kg/m.  General:  Well nourished, thin,  in no acute distress HEENT: normal Neck: some JVD Vascular: No carotid bruits; Distal pulses 2+ bilaterally Cardiac:  irreg irreg; no murmur gallup rub or click Lungs:  diminished breath sounds toRt base to auscultation no wheezing, rhonchi or rales  Abd: soft, nontender, no hepatomegaly  Ext: no edema Musculoskeletal:  No deformities, BUE and BLE strength normal and equal Skin: warm and dry  Neuro:  alert and oriented X 3 MAE, no focal abnormalities noted Psych:  Normal affect   Relevant CV Studies:  Echo pending   Echo 05/06/16 Study Conclusions   - Left ventricle: The cavity size was normal. There was mild focal    basal hypertrophy of the septum. Systolic function was normal.    The estimated ejection fraction was 50%. Wall motion was normal;    there were no regional wall motion abnormalities. There was an    increased relative contribution of atrial contraction to    ventricular filling. Doppler parameters are consistent with    abnormal left ventricular relaxation (grade 1 diastolic    dysfunction).  - Mitral valve: There was trivial regurgitation.  - Pulmonary arteries: PA peak pressure: 35 mm Hg (S).   Impressions:   - The right ventricular systolic pressure was increased consistent    with mild pulmonary hypertension.   Laboratory Data:  High Sensitivity Troponin:   Recent Labs  Lab 10/24/22 1546 10/24/22 1830  TROPONINIHS 61* 57*     Chemistry Recent Labs  Lab 10/24/22 1546 10/25/22 0615  NA 140 141  K 5.0 5.1  CL 109 114*  CO2 17* 15*  GLUCOSE 111* 102*  BUN 62* 57*  CREATININE 2.41* 2.15*  CALCIUM 9.4 9.1  MG  --  2.2  GFRNONAA 26* 30*  ANIONGAP 14 12    No results for input(s): "PROT",  "ALBUMIN", "AST", "ALT", "ALKPHOS", "BILITOT" in the last 168 hours. Lipids No results for input(s): "CHOL", "TRIG", "HDL", "LABVLDL", "LDLCALC", "CHOLHDL" in the last 168 hours.  Hematology Recent Labs  Lab 10/24/22 1546 10/25/22 0205 10/25/22 0615  WBC 6.3 6.3 6.3  RBC 3.44* 3.18* 3.08*  HGB 10.0* 9.4* 9.0*  HCT 31.0* 28.1* 27.1*  MCV 90.1 88.4 88.0  MCH 29.1 29.6 29.2  MCHC 32.3 33.5 33.2  RDW 15.4 15.1 15.4  PLT 263 241 235   Thyroid  Recent Labs  Lab 10/25/22 0615  TSH 4.260    BNP Recent Labs  Lab 10/24/22 1603  BNP 721.7*    DDimer  Recent Labs  Lab 10/24/22 1546  DDIMER 6.07*     Radiology/Studies:  DG Chest Port 1 View  Result Date: 10/24/2022 CLINICAL DATA:  Shortness of breath. EXAM: PORTABLE CHEST 1 VIEW COMPARISON:  None Available. FINDINGS: The heart is enlarged. There is focal patchy density at the LATERAL RIGHT lung base, consistent with infectious or inflammatory process. There is minimal atelectasis in the LEFT lung base. No evidence for pulmonary edema. IMPRESSION: 1. Cardiomegaly. 2. RIGHT LOWER lobe infiltrate. Followup PA and lateral chest X-ray is recommended in 3-4 weeks following trial of antibiotic therapy to ensure resolution and exclude underlying malignancy. Electronically Signed   By: Nolon Nations M.D.   On: 10/24/2022 16:17     Assessment and Plan:   Atrial flutter with RVR on IV dilt and IV heparin with CHA2DS2VASc of 3.  In combination with PNA currently rate controlled.  On IV dilt, plan to change to eliquis, once echo complete, and po dilt if Dr. Sallyanne Kuster agrees.  If only  due to PNA consider stopping anticoagulation after 3-4 weeks once in SR.  If does not convert possible outpt DCCV in 2-3 weeks.  Depending on how he tolerates po meds here.    Troponin 57 and 61 no chest pain,  prob due to a fib.  May consider ischemic eval as outpt.  Chronic CHF no I&O Community acquired PNA and hypoxemia per IM on ABX New LBBB HTN- elevated.    CKD-3b  today Cr 2.15.    Risk Assessment/Risk Scores:          CHA2DS2-VASc Score = 3   This indicates a 3.2% annual risk of stroke. The patient's score is based upon: CHF History: 0 HTN History: 1 Diabetes History: 0 Stroke History: 0 Vascular Disease History: 0 Age Score: 2 Gender Score: 0         For questions or updates, please contact [place] Please consult [email or web address] for contact info under    Signed, Cecilie Kicks, NP  10/25/2022 9:01 AM    Attending Note:   The patient was seen and examined.  Agree with assessment and plan as noted above.  Changes made to the above note as needed.  Patient seen and independently examined with Cecilie Kicks, NP .   We discussed all aspects of the encounter. I agree with the assessment and plan as stated above.   1.  Atrial fibrillation/atrial flutter: Douglas Matthews presents with pneumonia and was found to have atrial fibrillation/atrial flutter.  His rate is currently well-controlled on diltiazem drip.  He is on heparin drip.  I would favor transitioning him to Eliquis.  Will see him in the office in 2 to 3 weeks for follow-up visit.  Will schedule him for a cardioversion if he still in atrial fibrillation time.  2.  Chronic combined systolic and diastolic congestive heart failure: Echocardiogram in June, 2017 reveals mild systolic and diastolic congestive heart failure.  Repeat echocardiogram is pending.  3.  Hypertension: He is currently on amlodipine at home.  He is on diltiazem drip at this time.  4.  Chronic kidney disease: Creatinine is 2.15.  He follows with Dr. Marval Regal.  His elevated BNP is likely exacerbated by his CKD     5.  Pneumonia :  plans per Dr. Sloan Leiter.      I have spent a total of 40 minutes with patient reviewing hospital  notes , telemetry, EKGs, labs and examining patient as well as establishing an assessment and plan that was discussed with the patient.  > 50% of time was spent in  direct patient care.    Thayer Headings, Brooke Bonito., MD, [facility] 10/25/2022, 10:32 AM [address],  [place] Pager [phone]

## 2022-10-25 NOTE — Progress Notes (Signed)
  Echocardiogram 2D Echocardiogram has been performed.  Douglas Matthews 10/25/2022, 3:51 PM

## 2022-10-26 DIAGNOSIS — I5043 Acute on chronic combined systolic (congestive) and diastolic (congestive) heart failure: Secondary | ICD-10-CM

## 2022-10-26 DIAGNOSIS — I4891 Unspecified atrial fibrillation: Secondary | ICD-10-CM | POA: Diagnosis not present

## 2022-10-26 LAB — CBC
HCT: 27.4 % — ABNORMAL LOW (ref 39.0–52.0)
Hemoglobin: 9.3 g/dL — ABNORMAL LOW (ref 13.0–17.0)
MCH: 29.7 pg (ref 26.0–34.0)
MCHC: 33.9 g/dL (ref 30.0–36.0)
MCV: 87.5 fL (ref 80.0–100.0)
Platelets: 258 10*3/uL (ref 150–400)
RBC: 3.13 MIL/uL — ABNORMAL LOW (ref 4.22–5.81)
RDW: 15.5 % (ref 11.5–15.5)
WBC: 7.1 10*3/uL (ref 4.0–10.5)
nRBC: 0 % (ref 0.0–0.2)

## 2022-10-26 LAB — HEPARIN LEVEL (UNFRACTIONATED): Heparin Unfractionated: 0.49 IU/mL (ref 0.30–0.70)

## 2022-10-26 LAB — STREP PNEUMONIAE URINARY ANTIGEN: Strep Pneumo Urinary Antigen: NEGATIVE

## 2022-10-26 MED ORDER — AZITHROMYCIN 250 MG PO TABS
500.0000 mg | ORAL_TABLET | Freq: Every day | ORAL | Status: AC
  Administered 2022-10-26: 500 mg via ORAL
  Filled 2022-10-26: qty 2

## 2022-10-26 MED ORDER — CEFDINIR 300 MG PO CAPS
300.0000 mg | ORAL_CAPSULE | Freq: Every day | ORAL | Status: AC
  Administered 2022-10-26 – 2022-10-28 (×3): 300 mg via ORAL
  Filled 2022-10-26 (×3): qty 1

## 2022-10-26 MED ORDER — APIXABAN 2.5 MG PO TABS
2.5000 mg | ORAL_TABLET | Freq: Two times a day (BID) | ORAL | Status: DC
  Administered 2022-10-26 – 2022-10-30 (×9): 2.5 mg via ORAL
  Filled 2022-10-26 (×9): qty 1

## 2022-10-26 MED ORDER — HYDRALAZINE HCL 10 MG PO TABS
10.0000 mg | ORAL_TABLET | Freq: Three times a day (TID) | ORAL | Status: DC
  Administered 2022-10-26 – 2022-10-30 (×12): 10 mg via ORAL
  Filled 2022-10-26 (×13): qty 1

## 2022-10-26 MED ORDER — OFF THE BEAT BOOK
Freq: Once | Status: AC
  Filled 2022-10-26: qty 1

## 2022-10-26 MED ORDER — CARVEDILOL 3.125 MG PO TABS
3.1250 mg | ORAL_TABLET | Freq: Two times a day (BID) | ORAL | Status: DC
  Administered 2022-10-26 – 2022-10-27 (×2): 3.125 mg via ORAL
  Filled 2022-10-26 (×2): qty 1

## 2022-10-26 MED ORDER — ISOSORBIDE MONONITRATE ER 30 MG PO TB24
30.0000 mg | ORAL_TABLET | Freq: Every day | ORAL | Status: DC
  Administered 2022-10-26 – 2022-10-30 (×5): 30 mg via ORAL
  Filled 2022-10-26 (×5): qty 1

## 2022-10-26 NOTE — Progress Notes (Signed)
PROGRESS NOTE    Douglas Matthews  [id number] DOB: [birth year] DOA: 10/24/2022 PCP: [place], [name], MD    Brief Narrative:  87 year old gentleman with history of hypertension, hyperlipidemia, CKD stage IIIb presented to the emergency room with shortness of breath for about 2 days, however he had some exertional dyspnea for a few weeks.  In the emergency room he was on 2 L oxygen.  Creatinine 2.4 which is about stable.  Slightly elevated troponins.  D-dimer was 6.  BNP was 721.  Chest x-ray showed cardiomegaly and right lower lobe infiltrate.  Patient also was found with new left bundle branch block. Subsequently he was found with rapid A-fib, echocardiogram with ejection fraction 20 to 25%.   Assessment & Plan:   New onset A-fib with RVR: Presented with a heart rate of 140 and on Cardizem drip.  Currently remains on Cardizem infusion.  Heart rate is well controlled. Was on heparin, starting on Eliquis today. Discontinue Cardizem, will start on carvedilol as recommended by cardiology. D-dimer was elevated, already on heparin and planning for Eliquis forever.  Patient unable to get CT angiogram due to renal functions to avoid further complications.  We will treat with Eliquis as above. TSH was 4.2.  Suspected community-acquired pneumonia with acute hypoxemic respiratory failure: Cultures are negative.  Afebrile.  Without leukocytosis.  His symptomatology is likely due to congestive heart failure. Azithromycin for 3 days Rocephin day 2 today, will change to oral cefdinir for total 5 days of duration.  Continue chest physiotherapy and mobility. On minimal oxygen.  Keep on oxygen to keep saturations more than 90%.  Acute on chronic congestive heart failure: Previous known ejection fraction of 50%, repeat echocardiogram with ejection fraction 20%.  Currently euvolemic.  Followed by cardiology. With new onset left bundle branch block, patient in need of ischemic evaluation, however avoiding  contrast due to renal dysfunction. Cardiology following.  Starting on carvedilol.  CKD stage IIIb: His renal functions are at about his baseline.  Monitor on treatment.   DVT prophylaxis: apixaban (ELIQUIS) tablet 2.5 mg Start: 10/26/22 1000 apixaban (ELIQUIS) tablet 2.5 mg   Code Status: Full code Family Communication: None today Disposition Plan: Status is: Inpatient Remains inpatient appropriate because: To control heart rate, IV antibiotics and oxygen dependent.     Consultants:  Cardiology  Procedures:  None  Antimicrobials:  Rocephin azithromycin 11/25---   Subjective: Patient seen and examined.  Denies any complaints.  Denies any chest pain.  He is not mobilized around so does not know whether he will get short of breath.  Remains afebrile.  No cough or sputum production. Telemetry monitor shows persistent A-fib but heart rate is mostly less than 100.  Patient without palpitations.  Objective: Vitals:   10/26/22 0237 10/26/22 0414 10/26/22 0737 10/26/22 1229  BP: (!) 143/75 135/85 (!) 142/84 (!) 128/103  Pulse: 98 72 73 (!) 101  Resp:  18 20 18   Temp:  98.5 F (36.9 C) 98 F (36.7 C) (!) 97.4 F (36.3 C)  TempSrc:  Oral Oral Oral  SpO2: 95% 96% 98% 98%  Weight:      Height:        Intake/Output Summary (Last 24 hours) at 10/26/2022 1430 Last data filed at 10/26/2022 0700 Gross per 24 hour  Intake 1019.1 ml  Output 325 ml  Net 694.1 ml   Filed Weights   10/24/22 1540 10/25/22 0103  Weight: 68 kg 66.6 kg    Examination:  General exam: Appears calm  and comfortable, on minimal oxygen. Respiratory system: Clear to auscultation. Respiratory effort normal.  No added sounds. Cardiovascular system: S1 & S2 heard, irregularly irregular.  No JVD, murmurs, rubs, gallops or clicks. No pedal edema. Gastrointestinal system: Abdomen is nondistended, soft and nontender. No organomegaly or masses felt. Normal bowel sounds heard. Central nervous system: Alert and  oriented. No focal neurological deficits. Extremities: Symmetric 5 x 5 power. Skin: No rashes, lesions or ulcers Psychiatry: Judgement and insight appear normal. Mood & affect appropriate.     Data Reviewed: I have personally reviewed following labs and imaging studies  CBC: Recent Labs  Lab 10/24/22 1546 10/25/22 0205 10/25/22 0615 10/26/22 0200  WBC 6.3 6.3 6.3 7.1  HGB 10.0* 9.4* 9.0* 9.3*  HCT 31.0* 28.1* 27.1* 27.4*  MCV 90.1 88.4 88.0 87.5  PLT 263 241 235 0000000   Basic Metabolic Panel: Recent Labs  Lab 10/24/22 1546 10/25/22 0615  NA 140 141  K 5.0 5.1  CL 109 114*  CO2 17* 15*  GLUCOSE 111* 102*  BUN 62* 57*  CREATININE 2.41* 2.15*  CALCIUM 9.4 9.1  MG  --  2.2   GFR: Estimated Creatinine Clearance: 24.1 mL/min (A) (by C-G formula based on SCr of 2.15 mg/dL (H)). Liver Function Tests: No results for input(s): "AST", "ALT", "ALKPHOS", "BILITOT", "PROT", "ALBUMIN" in the last 168 hours. No results for input(s): "LIPASE", "AMYLASE" in the last 168 hours. No results for input(s): "AMMONIA" in the last 168 hours. Coagulation Profile: No results for input(s): "INR", "PROTIME" in the last 168 hours. Cardiac Enzymes: No results for input(s): "CKTOTAL", "CKMB", "CKMBINDEX", "TROPONINI" in the last 168 hours. BNP (last 3 results) No results for input(s): "PROBNP" in the last 8760 hours. HbA1C: No results for input(s): "HGBA1C" in the last 72 hours. CBG: Recent Labs  Lab 10/25/22 0734 10/25/22 1211 10/25/22 1624  GLUCAP 93 110* 133*   Lipid Profile: No results for input(s): "CHOL", "HDL", "LDLCALC", "TRIG", "CHOLHDL", "LDLDIRECT" in the last 72 hours. Thyroid Function Tests: Recent Labs    10/25/22 0615  TSH 4.260   Anemia Panel: No results for input(s): "VITAMINB12", "FOLATE", "FERRITIN", "TIBC", "IRON", "RETICCTPCT" in the last 72 hours. Sepsis Labs: Recent Labs  Lab 10/24/22 1633  LATICACIDVEN 1.2    Recent Results (from the past 240 hour(s))   Blood culture (routine x 2)     Status: None (Preliminary result)   Collection Time: 10/24/22  4:55 PM   Specimen: BLOOD LEFT FOREARM  Result Value Ref Range Status   Specimen Description   Final    BLOOD LEFT FOREARM Performed at [facility], [place] [address] [place] [address], [place], [place] [address]    Special Requests   Final    BOTTLES DRAWN AEROBIC AND ANAEROBIC Blood Culture adequate volume Performed at [facility], [address] [place], [place] [address]    Culture   Final    NO GROWTH < 24 HOURS Performed at [facility], [place] [address], [place], [place] [address]    Report Status PENDING  Incomplete  Blood culture (routine x 2)     Status: None (Preliminary result)   Collection Time: 10/24/22  4:58 PM   Specimen: BLOOD RIGHT FOREARM  Result Value Ref Range Status   Specimen Description   Final    BLOOD RIGHT FOREARM Performed at [facility], [place] [address] [place], [place] [address]    Special Requests   Final    BOTTLES DRAWN AEROBIC AND ANAEROBIC  Blood Culture adequate volume Performed at [facility], [address] [place], [place] [address]    Culture   Final    NO GROWTH < 24 HOURS Performed at [facility], [place] [address] [place], [place] [address]    Report Status PENDING  Incomplete  SARS Coronavirus 2 by RT PCR (hospital order, performed in [facility] hospital lab) *cepheid single result test* Anterior Nasal Swab     Status: None   Collection Time: 10/24/22  6:30 PM   Specimen: Anterior Nasal Swab  Result Value Ref Range Status   SARS Coronavirus 2 by RT PCR NEGATIVE NEGATIVE Final    Comment: (NOTE) SARS-CoV-2 target nucleic acids are NOT DETECTED.  The SARS-CoV-2 RNA is generally detectable in upper and lower respiratory specimens during the acute phase of infection. The lowest concentration of SARS-CoV-2 viral copies this assay can detect is 250 copies / mL. A negative result does not  preclude SARS-CoV-2 infection and should not be used as the sole basis for treatment or other patient management decisions.  A negative result may occur with improper specimen collection / handling, submission of specimen other than nasopharyngeal swab, presence of viral mutation(s) within the areas targeted by this assay, and inadequate number of viral copies (<250 copies / mL). A negative result must be combined with clinical observations, patient history, and epidemiological information.  Fact Sheet for Patients:   https://www.patel.info/  Fact Sheet for Healthcare Providers: https://hall.com/  This test is not yet approved or  cleared by the [place] FDA and has been authorized for detection and/or diagnosis of SARS-CoV-2 by FDA under an Emergency Use Authorization (EUA).  This EUA will remain in effect (meaning this test can be used) for the duration of the COVID-19 declaration under Section 564(b)(1) of the Act, 21 U.S.C. section 360bbb-3(b)(1), unless the authorization is terminated or revoked sooner.  Performed at [facility], [address], [place],  [address]          Radiology Studies: ECHOCARDIOGRAM COMPLETE  Result Date: 10/25/2022    ECHOCARDIOGRAM REPORT   Patient Name:   JERRON FLAIR Date of Exam: 10/25/2022 Medical Rec #:  [id number]        Height:       68.0 in Accession #:    [id number]       Weight:       146.8 lb Date of Birth:  [birth year]       BSA:          1.792 m Patient Age:    10 years         BP:           153/103 mmHg Patient Gender: M                HR:           93 bpm. Exam Location:  Inpatient Procedure: 2D Echo, Cardiac Doppler and Color Doppler Indications:    Atrial fibrillation  History:        Patient has prior history of Echocardiogram examinations, most                 recent 05/06/2016. CHF, Arrythmias:LBBB, Signs/Symptoms:Shortness                 of Breath; Risk  Factors:Dyslipidemia and Hypertension.  Sonographer:    Clayton Lefort RDCS (AE) Referring Phys: Shela Leff  Sonographer Comments: Suboptimal parasternal window. IMPRESSIONS  1. There is substantial septal-lateral  left ventricular dyssynchrony. Left ventricular ejection fraction, by estimation, is 25 to 30%. The left ventricle has severely decreased function. The left ventricle demonstrates global hypokinesis. There is moderate concentric left ventricular hypertrophy. Left ventricular diastolic function could not be evaluated.  2. Right ventricular systolic function is moderately reduced. The right ventricular size is normal. There is normal pulmonary artery systolic pressure. The estimated right ventricular systolic pressure is 123456 mmHg.  3. Left atrial size was mild to moderately dilated.  4. Right atrial size was mildly dilated.  5. The mitral valve is normal in structure. Mild to moderate mitral valve regurgitation.  6. The aortic valve has an indeterminate number of cusps. There is moderate calcification of the aortic valve. There is moderate thickening of the aortic valve. Mild to moderate aortic valve stenosis. Gradients are low due to poor LV function.  7. The inferior vena cava is dilated in size with <50% respiratory variability, suggesting right atrial pressure of 15 mmHg. Comparison(s): The left ventricular function is significantly worse. The right ventricular systolic function is worse. FINDINGS  Left Ventricle: There is substantial septal-lateral left ventricular dyssynchrony. Left ventricular ejection fraction, by estimation, is 25 to 30%. The left ventricle has severely decreased function. The left ventricle demonstrates global hypokinesis. The left ventricular internal cavity size was normal in size. There is moderate concentric left ventricular hypertrophy. Abnormal (paradoxical) septal motion, consistent with left bundle branch block. Left ventricular diastolic function could not be evaluated  due to atrial fibrillation. Left ventricular diastolic function could not be evaluated. Right Ventricle: The right ventricular size is normal. No increase in right ventricular wall thickness. Right ventricular systolic function is moderately reduced. There is normal pulmonary artery systolic pressure. The tricuspid regurgitant velocity is 2.02 m/s, and with an assumed right atrial pressure of 15 mmHg, the estimated right ventricular systolic pressure is 123456 mmHg. Left Atrium: Left atrial size was mild to moderately dilated. Right Atrium: Right atrial size was mildly dilated. Pericardium: Trivial pericardial effusion is present. Mitral Valve: The mitral valve is normal in structure. Mild to moderate mitral valve regurgitation, with centrally-directed jet. MV peak gradient, 6.5 mmHg. The mean mitral valve gradient is 3.0 mmHg. Tricuspid Valve: The tricuspid valve is normal in structure. Tricuspid valve regurgitation is trivial. Aortic Valve: The aortic valve has an indeterminant number of cusps. There is moderate calcification of the aortic valve. There is moderate thickening of the aortic valve. Aortic valve regurgitation is not visualized. Mild to moderate aortic stenosis is present. Aortic valve mean gradient measures 7.0 mmHg. Aortic valve peak gradient measures 12.0 mmHg. Aortic valve area, by VTI measures 1.47 cm. Pulmonic Valve: The pulmonic valve was grossly normal. Pulmonic valve regurgitation is not visualized. Aorta: The aortic root is normal in size and structure. Venous: The inferior vena cava is dilated in size with less than 50% respiratory variability, suggesting right atrial pressure of 15 mmHg. IAS/Shunts: No atrial level shunt detected by color flow Doppler.  LEFT VENTRICLE PLAX 2D LVIDd:         4.70 cm LV PW:         1.60 cm LV IVS:        1.40 cm LVOT diam:     2.05 cm LV SV:         39 LV SV Index:   22 LVOT Area:     3.30 cm  LV Volumes (MOD) LV vol d, MOD A2C: 153.0 ml LV vol d, MOD A4C:  109.0 ml LV vol s,  MOD A2C: 83.1 ml LV vol s, MOD A4C: 85.1 ml LV SV MOD A2C:     69.9 ml LV SV MOD A4C:     109.0 ml LV SV MOD BP:      46.4 ml RIGHT VENTRICLE            IVC RV Basal diam:  3.10 cm    IVC diam: 2.20 cm RV S prime:     7.62 cm/s TAPSE (M-mode): 1.6 cm LEFT ATRIUM             Index        RIGHT ATRIUM           Index LA diam:        4.30 cm 2.40 cm/m   RA Area:     18.60 cm LA Vol (A2C):   63.7 ml 35.55 ml/m  RA Volume:   54.90 ml  30.64 ml/m LA Vol (A4C):   45.1 ml 25.17 ml/m LA Biplane Vol: 56.2 ml 31.36 ml/m  AORTIC VALVE AV Area (Vmax):    1.71 cm AV Area (Vmean):   1.59 cm AV Area (VTI):     1.47 cm AV Vmax:           173.00 cm/s AV Vmean:          126.000 cm/s AV VTI:            0.268 m AV Peak Grad:      12.0 mmHg AV Mean Grad:      7.0 mmHg LVOT Vmax:         89.70 cm/s LVOT Vmean:        60.733 cm/s LVOT VTI:          0.120 m LVOT/AV VTI ratio: 0.45  AORTA Ao Root diam: 3.20 cm MITRAL VALVE              TRICUSPID VALVE MV Peak grad: 6.5 mmHg    TR Peak grad:   16.3 mmHg MV Mean grad: 3.0 mmHg    TR Vmax:        201.68 cm/s MV Vmax:      1.27 m/s MV Vmean:     87.7 cm/s   SHUNTS MR Peak grad: 68.6 mmHg   Systemic VTI:  0.12 m MR Mean grad: 49.0 mmHg   Systemic Diam: 2.05 cm MR Vmax:      414.00 cm/s MR Vmean:     340.0 cm/s Dani Gobble Croitoru MD Electronically signed by Sanda Klein MD Signature Date/Time: 10/25/2022/4:08:36 PM    Final    DG Chest Port 1 View  Result Date: 10/24/2022 CLINICAL DATA:  Shortness of breath. EXAM: PORTABLE CHEST 1 VIEW COMPARISON:  None Available. FINDINGS: The heart is enlarged. There is focal patchy density at the LATERAL RIGHT lung base, consistent with infectious or inflammatory process. There is minimal atelectasis in the LEFT lung base. No evidence for pulmonary edema. IMPRESSION: 1. Cardiomegaly. 2. RIGHT LOWER lobe infiltrate. Followup PA and lateral chest X-ray is recommended in 3-4 weeks following trial of antibiotic therapy to ensure  resolution and exclude underlying malignancy. Electronically Signed   By: Nolon Nations M.D.   On: 10/24/2022 16:17        Scheduled Meds:  apixaban  2.5 mg Oral BID   azithromycin  500 mg Oral Daily   carvedilol  3.125 mg Oral BID WC   cefdinir  300 mg Oral Daily   hydrALAZINE  10 mg Oral Q8H   isosorbide  mononitrate  30 mg Oral Daily   Continuous Infusions:  diltiazem (CARDIZEM) infusion 5 mg/hr (10/26/22 1150)     LOS: 1 day    Time spent: 35 minutes    Dorcas Carrow, MD [facility] Pager [phone]

## 2022-10-26 NOTE — Progress Notes (Signed)
Rounding Note    Patient Name: Douglas Matthews Date of Encounter: 10/26/2022  [facility] Cardiologist:   New to [facility]   Subjective   87 year old gentleman with a history of hypertension, hyperlipidemia and new onset atrial fibs/atrial flutter associated with pneumonia.  He has a new left bundle branch block.  Rate is currently well-controlled on a diltiazem drip.  Echocardiogram from November 25 revealed severely reduced left ventricular systolic function with an EF of 25 to 30%.  We were not able to evaluate diastolic function  Right ventricular function is moderately reduced.  RV size is normal.  Pulmonary artery pressure is normal. Mild to moderate mitral regurgitation.  Mild to moderate aortic stenosis.  Creatinine is elevated at 2.15.  He is anemic with a hemoglobin of 9.3.    Inpatient Medications    Scheduled Meds:  off the beat book   Does not apply Once   Continuous Infusions:  azithromycin (ZITHROMAX) 500 mg in sodium chloride 0.9 % 250 mL IVPB 500 mg (10/25/22 2110)   cefTRIAXone (ROCEPHIN)  IV 1 g (10/25/22 1852)   diltiazem (CARDIZEM) infusion 10 mg/hr (10/26/22 0219)   heparin 950 Units/hr (10/25/22 1627)   PRN Meds: acetaminophen **OR** acetaminophen   Vital Signs    Vitals:   10/26/22 0213 10/26/22 0237 10/26/22 0414 10/26/22 0737  BP: (!) 152/93 (!) 143/75 135/85 (!) 142/84  Pulse: (!) 146 98 72 73  Resp:   18 20  Temp:   98.5 F (36.9 C) 98 F (36.7 C)  TempSrc:   Oral Oral  SpO2: 91% 95% 96% 98%  Weight:      Height:        Intake/Output Summary (Last 24 hours) at 10/26/2022 0918 Last data filed at 10/26/2022 0700 Gross per 24 hour  Intake 1019.1 ml  Output 325 ml  Net 694.1 ml      10/25/2022    1:03 AM 10/24/2022    3:40 PM 07/08/2022    2:32 PM  Last 3 Weights  Weight (lbs) 146 lb 12.8 oz 150 lb 150 lb 4.8 oz  Weight (kg) 66.588 kg 68.04 kg 68.176 kg      Telemetry    Afib with controlled V response  Personally Reviewed  ECG     - Personally Reviewed  Physical Exam   GEN: No acute distress.   Neck: No JVD Cardiac: irreg. Irreg.  Respiratory: bilat rhonchi, reduced breath sounds in bases.  GI: Soft, nontender, non-distended  MS: No edema; No deformity. Neuro:  Nonfocal  Psych: Normal affect   Labs    High Sensitivity Troponin:   Recent Labs  Lab 10/24/22 1546 10/24/22 1830  TROPONINIHS 61* 57*     Chemistry Recent Labs  Lab 10/24/22 1546 10/25/22 0615  NA 140 141  K 5.0 5.1  CL 109 114*  CO2 17* 15*  GLUCOSE 111* 102*  BUN 62* 57*  CREATININE 2.41* 2.15*  CALCIUM 9.4 9.1  MG  --  2.2  GFRNONAA 26* 30*  ANIONGAP 14 12    Lipids No results for input(s): "CHOL", "TRIG", "HDL", "LABVLDL", "LDLCALC", "CHOLHDL" in the last 168 hours.  Hematology Recent Labs  Lab 10/25/22 0205 10/25/22 0615 10/26/22 0200  WBC 6.3 6.3 7.1  RBC 3.18* 3.08* 3.13*  HGB 9.4* 9.0* 9.3*  HCT 28.1* 27.1* 27.4*  MCV 88.4 88.0 87.5  MCH 29.6 29.2 29.7  MCHC 33.5 33.2 33.9  RDW 15.1 15.4 15.5  PLT 241 235 258  Thyroid  Recent Labs  Lab 10/25/22 0615  TSH 4.260    BNP Recent Labs  Lab 10/24/22 1603  BNP 721.7*    DDimer  Recent Labs  Lab 10/24/22 1546  DDIMER 6.07*     Radiology    ECHOCARDIOGRAM COMPLETE  Result Date: 10/25/2022    ECHOCARDIOGRAM REPORT   Patient Name:   Douglas Matthews Date of Exam: 10/25/2022 Medical Rec #:  [id number]        Height:       68.0 in Accession #:    [id number]       Weight:       146.8 lb Date of Birth:  [birth year]       BSA:          1.792 m Patient Age:    79 years         BP:           153/103 mmHg Patient Gender: M                HR:           93 bpm. Exam Location:  Inpatient Procedure: 2D Echo, Cardiac Doppler and Color Doppler Indications:    Atrial fibrillation  History:        Patient has prior history of Echocardiogram examinations, most                 recent 05/06/2016. CHF, Arrythmias:LBBB, Signs/Symptoms:Shortness                  of Breath; Risk Factors:Dyslipidemia and Hypertension.  Sonographer:    Clayton Lefort RDCS (AE) Referring Phys: Shela Leff  Sonographer Comments: Suboptimal parasternal window. IMPRESSIONS  1. There is substantial septal-lateral left ventricular dyssynchrony. Left ventricular ejection fraction, by estimation, is 25 to 30%. The left ventricle has severely decreased function. The left ventricle demonstrates global hypokinesis. There is moderate concentric left ventricular hypertrophy. Left ventricular diastolic function could not be evaluated.  2. Right ventricular systolic function is moderately reduced. The right ventricular size is normal. There is normal pulmonary artery systolic pressure. The estimated right ventricular systolic pressure is 123456 mmHg.  3. Left atrial size was mild to moderately dilated.  4. Right atrial size was mildly dilated.  5. The mitral valve is normal in structure. Mild to moderate mitral valve regurgitation.  6. The aortic valve has an indeterminate number of cusps. There is moderate calcification of the aortic valve. There is moderate thickening of the aortic valve. Mild to moderate aortic valve stenosis. Gradients are low due to poor LV function.  7. The inferior vena cava is dilated in size with <50% respiratory variability, suggesting right atrial pressure of 15 mmHg. Comparison(s): The left ventricular function is significantly worse. The right ventricular systolic function is worse. FINDINGS  Left Ventricle: There is substantial septal-lateral left ventricular dyssynchrony. Left ventricular ejection fraction, by estimation, is 25 to 30%. The left ventricle has severely decreased function. The left ventricle demonstrates global hypokinesis. The left ventricular internal cavity size was normal in size. There is moderate concentric left ventricular hypertrophy. Abnormal (paradoxical) septal motion, consistent with left bundle branch block. Left ventricular diastolic  function could not be evaluated due to atrial fibrillation. Left ventricular diastolic function could not be evaluated. Right Ventricle: The right ventricular size is normal. No increase in right ventricular wall thickness. Right ventricular systolic function is moderately reduced. There is normal pulmonary artery systolic pressure. The tricuspid regurgitant velocity is 2.02 m/s, and  with an assumed right atrial pressure of 15 mmHg, the estimated right ventricular systolic pressure is 123456 mmHg. Left Atrium: Left atrial size was mild to moderately dilated. Right Atrium: Right atrial size was mildly dilated. Pericardium: Trivial pericardial effusion is present. Mitral Valve: The mitral valve is normal in structure. Mild to moderate mitral valve regurgitation, with centrally-directed jet. MV peak gradient, 6.5 mmHg. The mean mitral valve gradient is 3.0 mmHg. Tricuspid Valve: The tricuspid valve is normal in structure. Tricuspid valve regurgitation is trivial. Aortic Valve: The aortic valve has an indeterminant number of cusps. There is moderate calcification of the aortic valve. There is moderate thickening of the aortic valve. Aortic valve regurgitation is not visualized. Mild to moderate aortic stenosis is present. Aortic valve mean gradient measures 7.0 mmHg. Aortic valve peak gradient measures 12.0 mmHg. Aortic valve area, by VTI measures 1.47 cm. Pulmonic Valve: The pulmonic valve was grossly normal. Pulmonic valve regurgitation is not visualized. Aorta: The aortic root is normal in size and structure. Venous: The inferior vena cava is dilated in size with less than 50% respiratory variability, suggesting right atrial pressure of 15 mmHg. IAS/Shunts: No atrial level shunt detected by color flow Doppler.  LEFT VENTRICLE PLAX 2D LVIDd:         4.70 cm LV PW:         1.60 cm LV IVS:        1.40 cm LVOT diam:     2.05 cm LV SV:         39 LV SV Index:   22 LVOT Area:     3.30 cm  LV Volumes (MOD) LV vol d, MOD  A2C: 153.0 ml LV vol d, MOD A4C: 109.0 ml LV vol s, MOD A2C: 83.1 ml LV vol s, MOD A4C: 85.1 ml LV SV MOD A2C:     69.9 ml LV SV MOD A4C:     109.0 ml LV SV MOD BP:      46.4 ml RIGHT VENTRICLE            IVC RV Basal diam:  3.10 cm    IVC diam: 2.20 cm RV S prime:     7.62 cm/s TAPSE (M-mode): 1.6 cm LEFT ATRIUM             Index        RIGHT ATRIUM           Index LA diam:        4.30 cm 2.40 cm/m   RA Area:     18.60 cm LA Vol (A2C):   63.7 ml 35.55 ml/m  RA Volume:   54.90 ml  30.64 ml/m LA Vol (A4C):   45.1 ml 25.17 ml/m LA Biplane Vol: 56.2 ml 31.36 ml/m  AORTIC VALVE AV Area (Vmax):    1.71 cm AV Area (Vmean):   1.59 cm AV Area (VTI):     1.47 cm AV Vmax:           173.00 cm/s AV Vmean:          126.000 cm/s AV VTI:            0.268 m AV Peak Grad:      12.0 mmHg AV Mean Grad:      7.0 mmHg LVOT Vmax:         89.70 cm/s LVOT Vmean:        60.733 cm/s LVOT VTI:          0.120 m LVOT/AV  VTI ratio: 0.45  AORTA Ao Root diam: 3.20 cm MITRAL VALVE              TRICUSPID VALVE MV Peak grad: 6.5 mmHg    TR Peak grad:   16.3 mmHg MV Mean grad: 3.0 mmHg    TR Vmax:        201.68 cm/s MV Vmax:      1.27 m/s MV Vmean:     87.7 cm/s   SHUNTS MR Peak grad: 68.6 mmHg   Systemic VTI:  0.12 m MR Mean grad: 49.0 mmHg   Systemic Diam: 2.05 cm MR Vmax:      414.00 cm/s MR Vmean:     340.0 cm/s Dani Gobble Croitoru MD Electronically signed by Sanda Klein MD Signature Date/Time: 10/25/2022/4:08:36 PM    Final    DG Chest Port 1 View  Result Date: 10/24/2022 CLINICAL DATA:  Shortness of breath. EXAM: PORTABLE CHEST 1 VIEW COMPARISON:  None Available. FINDINGS: The heart is enlarged. There is focal patchy density at the LATERAL RIGHT lung base, consistent with infectious or inflammatory process. There is minimal atelectasis in the LEFT lung base. No evidence for pulmonary edema. IMPRESSION: 1. Cardiomegaly. 2. RIGHT LOWER lobe infiltrate. Followup PA and lateral chest X-ray is recommended in 3-4 weeks following trial of  antibiotic therapy to ensure resolution and exclude underlying malignancy. Electronically Signed   By: Nolon Nations M.D.   On: 10/24/2022 16:17    Cardiac Studies      Patient Profile     87 y.o. male    Assessment & Plan     1.  Atrial fib:  rate is currently well controlled.  Will add Coreg to assist with rate control ( and elevated BP Cont heparin - ok to switch to Eliquis once we know that he does not need any additional procedures  He is tolerating well Plan is to continue anticoagulation for several weeks , continue tune up of CHF ,  anticipate DC cardioversion in 3-4 weeks   2.   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  Creatinine is elevated.  Will need to be cautious using Entresto or ARB.      3.  HTN:   BP remains elevated.   Adding hydralazine and Imdur  We would need to be cautious about adding ARB or Entresto            For questions or updates, please contact [place] Please consult [email or web address] for contact info under        Signed, Mertie Moores, MD  10/26/2022, 9:18 AM

## 2022-10-26 NOTE — Progress Notes (Signed)
Pt heart rate sustaining in 140s after attempting to use the urinal .  Pt denied any symptoms.  BP 152/93, Sats 90 on 2 L,IV dilt increased to 10 mg/hour with heart rate now 90s to low 100s.  Lungs without change to auscultation.  BP 143/75 after diltiazem titration.  Pt resting comfortably without complaints at this time.

## 2022-10-26 NOTE — Progress Notes (Signed)
ANTICOAGULATION CONSULT NOTE-Follow Up  Pharmacy Consult for heparin Indication: atrial fibrillation  No Known Allergies  Patient Measurements: Height: 5\' 8"  (172.7 cm) Weight: 66.6 kg (146 lb 12.8 oz) IBW/kg (Calculated) : 68.4 Heparin Dosing Weight: 68 kg  Vital Signs: Temp: 98 F (36.7 C) (11/26 0737) Temp Source: Oral (11/26 0737) BP: 142/84 (11/26 0737) Pulse Rate: 73 (11/26 0737)  Labs: Recent Labs    10/24/22 1546 10/24/22 1830 10/25/22 0205 10/25/22 0615 10/25/22 1234 10/26/22 0200  HGB 10.0*  --  9.4* 9.0*  --  9.3*  HCT 31.0*  --  28.1* 27.1*  --  27.4*  PLT 263  --  241 235  --  258  HEPARINUNFRC  --   --  0.60  --  0.56 0.49  CREATININE 2.41*  --   --  2.15*  --   --   TROPONINIHS 61* 57*  --   --   --   --      Estimated Creatinine Clearance: 24.1 mL/min (A) (by C-G formula based on SCr of 2.15 mg/dL (H)).   Medical History: Past Medical History:  Diagnosis Date   Anemia    BPH (benign prostatic hypertrophy)    Diverticulosis    Hyperlipidemia    Hypertension     Assessment: 87 yo M presenting with SHOB x2 days. EKG suspicious for atrial flutter per MD notes. No anticoagulants on last med rec or in fill history. Pharmacy consulted to dose heparin for anticoagulation pending workup.  CBC: Hgb 9/Plt 235/Ddimer 6.07  11/25 update:  Heparin level therapeutic (0.49) and Per RN no issues with infusion or signs/symptoms of bleeding. Per Dr. 12/25, okay to transition patient to oral eliquis 2.5 mg BID for atrial fibrillation  Goal of Therapy:  Heparin level 0.3-0.7 units/ml Monitor platelets by anticoagulation protocol: Yes   Plan:  Discontinue heparin and initiate eliquis 2.5 mg BID per cardiologist Monitor CBC daily Monitor signs/symptoms of bleeding F/u cost of eliquis and education for patient   Elease Hashimoto, PharmD. [facility] Acute Care PGY-1  10/26/2022 7:44 AM

## 2022-10-27 ENCOUNTER — Other Ambulatory Visit (HOSPITAL_COMMUNITY): Payer: Self-pay

## 2022-10-27 ENCOUNTER — Inpatient Hospital Stay (HOSPITAL_COMMUNITY): Payer: Medicare Other

## 2022-10-27 DIAGNOSIS — I4891 Unspecified atrial fibrillation: Secondary | ICD-10-CM | POA: Diagnosis not present

## 2022-10-27 DIAGNOSIS — I5043 Acute on chronic combined systolic (congestive) and diastolic (congestive) heart failure: Secondary | ICD-10-CM | POA: Diagnosis not present

## 2022-10-27 LAB — CBC
HCT: 24.6 % — ABNORMAL LOW (ref 39.0–52.0)
Hemoglobin: 8.2 g/dL — ABNORMAL LOW (ref 13.0–17.0)
MCH: 29.2 pg (ref 26.0–34.0)
MCHC: 33.3 g/dL (ref 30.0–36.0)
MCV: 87.5 fL (ref 80.0–100.0)
Platelets: 212 10*3/uL (ref 150–400)
RBC: 2.81 MIL/uL — ABNORMAL LOW (ref 4.22–5.81)
RDW: 15.5 % (ref 11.5–15.5)
WBC: 7.2 10*3/uL (ref 4.0–10.5)
nRBC: 0 % (ref 0.0–0.2)

## 2022-10-27 LAB — BASIC METABOLIC PANEL
Anion gap: 13 (ref 5–15)
BUN: 65 mg/dL — ABNORMAL HIGH (ref 8–23)
CO2: 17 mmol/L — ABNORMAL LOW (ref 22–32)
Calcium: 8.8 mg/dL — ABNORMAL LOW (ref 8.9–10.3)
Chloride: 111 mmol/L (ref 98–111)
Creatinine, Ser: 2.43 mg/dL — ABNORMAL HIGH (ref 0.61–1.24)
GFR, Estimated: 25 mL/min — ABNORMAL LOW (ref >60)
Glucose, Bld: 108 mg/dL — ABNORMAL HIGH (ref 70–99)
Potassium: 4.5 mmol/L (ref 3.5–5.1)
Sodium: 141 mmol/L (ref 135–145)

## 2022-10-27 LAB — LEGIONELLA PNEUMOPHILA SEROGP 1 UR AG: L. pneumophila Serogp 1 Ur Ag: NEGATIVE

## 2022-10-27 MED ORDER — TECHNETIUM TO 99M ALBUMIN AGGREGATED
4.0000 | Freq: Once | INTRAVENOUS | Status: AC | PRN
  Administered 2022-10-27: 4 via INTRAVENOUS

## 2022-10-27 MED ORDER — CARVEDILOL 6.25 MG PO TABS
6.2500 mg | ORAL_TABLET | Freq: Two times a day (BID) | ORAL | Status: DC
  Administered 2022-10-27 – 2022-10-29 (×5): 6.25 mg via ORAL
  Filled 2022-10-27 (×5): qty 1

## 2022-10-27 MED ORDER — CARVEDILOL 3.125 MG PO TABS: 3.1250 mg | ORAL_TABLET | Freq: Once | ORAL | Status: DC

## 2022-10-27 MED ORDER — CARVEDILOL 3.125 MG PO TABS
3.1250 mg | ORAL_TABLET | Freq: Once | ORAL | Status: AC
  Administered 2022-10-27: 3.125 mg via ORAL
  Filled 2022-10-27: qty 1

## 2022-10-27 MED ORDER — EMPAGLIFLOZIN 10 MG PO TABS
10.0000 mg | ORAL_TABLET | Freq: Every day | ORAL | Status: DC
  Administered 2022-10-27 – 2022-10-29 (×3): 10 mg via ORAL
  Filled 2022-10-27 (×4): qty 1

## 2022-10-27 NOTE — Progress Notes (Signed)
PROGRESS NOTE    Douglas Matthews  [id number] DOB: 1935-04-06 DOA: 10/24/2022 PCP: [place], [name], MD    Brief Narrative:  87 year old gentleman with history of hypertension, hyperlipidemia, CKD stage IIIb presented to the emergency room with shortness of breath for about 2 days, however he had some exertional dyspnea for a few weeks.  In the emergency room he was on 2 L oxygen.  Creatinine 2.4 which is about stable.  Slightly elevated troponins.  D-dimer was 6.  BNP was 721.  Chest x-ray showed cardiomegaly and right lower lobe infiltrate.  Patient also was found with new left bundle branch block. Subsequently he was found with rapid A-fib, echocardiogram with ejection fraction 20 to 25%.   Assessment & Plan:   New onset A-fib with RVR: Presented with a heart rate of 140 and treated with Cardizem drip.   Heart rate remains elevated with minimal activity.   Coreg 3.125 mg twice daily, increased to 6.25 twice daily today.  Will give additional dose today.   Was on heparin, started on Eliquis. D-dimer was significantly elevated. Patient unable to get CT angiogram due to renal functions to avoid further complications.  We will do VQ scan.  If positive patient will need a higher dose of Eliquis. TSH was 4.2.  Suspected community-acquired pneumonia with acute hypoxemic respiratory failure: Cultures are negative.  Afebrile.  Without leukocytosis.  His symptomatology is likely due to congestive heart failure. Complete 5 days of antibiotic therapy.  Acute on chronic congestive heart failure: Previous known ejection fraction of 50%, repeat echocardiogram with ejection fraction 20%.  Currently euvolemic.  Followed by cardiology. With new onset left bundle branch block, patient in need of ischemic evaluation, however avoiding contrast due to renal dysfunction. Cardiology following.  Starting on carvedilol.  Added Jardiance today.  CKD stage IIIb: His renal functions are at about his  baseline.  Monitor on treatment.   DVT prophylaxis: apixaban (ELIQUIS) tablet 2.5 mg Start: 10/26/22 1000 apixaban (ELIQUIS) tablet 2.5 mg   Code Status: Full code Family Communication: None today Disposition Plan: Status is: Inpatient Remains inpatient appropriate because: To control heart rate,      Consultants:  Cardiology  Procedures:  None  Antimicrobials:  Rocephin azithromycin 11/25---   Subjective: Patient seen and examined.  At rest he denies any complaints.  He is not sure he will get short of breath because he has not mobilized in the hallway. Overnight events noted.  His heart rate jumps to 140-150 with minimal activities.  No cough or sputum production.  Afebrile.  On minimal oxygen. Today is [birth year] birthday.  Objective: Vitals:   10/26/22 2014 10/27/22 0431 10/27/22 0945 10/27/22 1219  BP: (!) 150/75 124/70 123/87 (!) 143/91  Pulse: 74 (!) 57 94 (!) 136  Resp: 18   18  Temp: 97.7 F (36.5 C) 98.3 F (36.8 C)    TempSrc: Oral Oral    SpO2: 97% 94%    Weight:  65.5 kg    Height:       No intake or output data in the 24 hours ending 10/27/22 1237  Filed Weights   10/24/22 1540 10/25/22 0103 10/27/22 0431  Weight: 68 kg 66.6 kg 65.5 kg    Examination:  General exam: Appears calm and comfortable, on minimal oxygen at rest.  Appropriately anxious. Respiratory system: Clear to auscultation. Respiratory effort normal.  No added sounds. Cardiovascular system: S1 & S2 heard, irregularly irregular.  No JVD, murmurs, rubs, gallops or clicks. No  pedal edema. Gastrointestinal system: Abdomen is nondistended, soft and nontender. No organomegaly or masses felt. Normal bowel sounds heard. Central nervous system: Alert and oriented. No focal neurological deficits. Extremities: Symmetric 5 x 5 power.    Data Reviewed: I have personally reviewed following labs and imaging studies  CBC: Recent Labs  Lab 10/24/22 1546 10/25/22 0205 10/25/22 0615 10/26/22 0200  10/27/22 0219  WBC 6.3 6.3 6.3 7.1 7.2  HGB 10.0* 9.4* 9.0* 9.3* 8.2*  HCT 31.0* 28.1* 27.1* 27.4* 24.6*  MCV 90.1 88.4 88.0 87.5 87.5  PLT 263 241 235 258 212   Basic Metabolic Panel: Recent Labs  Lab 10/24/22 1546 10/25/22 0615 10/27/22 0219  NA 140 141 141  K 5.0 5.1 4.5  CL 109 114* 111  CO2 17* 15* 17*  GLUCOSE 111* 102* 108*  BUN 62* 57* 65*  CREATININE 2.41* 2.15* 2.43*  CALCIUM 9.4 9.1 8.8*  MG  --  2.2  --    GFR: Estimated Creatinine Clearance: 20.6 mL/min (A) (by C-G formula based on SCr of 2.43 mg/dL (H)). Liver Function Tests: No results for input(s): "AST", "ALT", "ALKPHOS", "BILITOT", "PROT", "ALBUMIN" in the last 168 hours. No results for input(s): "LIPASE", "AMYLASE" in the last 168 hours. No results for input(s): "AMMONIA" in the last 168 hours. Coagulation Profile: No results for input(s): "INR", "PROTIME" in the last 168 hours. Cardiac Enzymes: No results for input(s): "CKTOTAL", "CKMB", "CKMBINDEX", "TROPONINI" in the last 168 hours. BNP (last 3 results) No results for input(s): "PROBNP" in the last 8760 hours. HbA1C: No results for input(s): "HGBA1C" in the last 72 hours. CBG: Recent Labs  Lab 10/25/22 0734 10/25/22 1211 10/25/22 1624  GLUCAP 93 110* 133*   Lipid Profile: No results for input(s): "CHOL", "HDL", "LDLCALC", "TRIG", "CHOLHDL", "LDLDIRECT" in the last 72 hours. Thyroid Function Tests: Recent Labs    10/25/22 0615  TSH 4.260   Anemia Panel: No results for input(s): "VITAMINB12", "FOLATE", "FERRITIN", "TIBC", "IRON", "RETICCTPCT" in the last 72 hours. Sepsis Labs: Recent Labs  Lab 10/24/22 1633  LATICACIDVEN 1.2    Recent Results (from the past 240 hour(s))  Blood culture (routine x 2)     Status: None (Preliminary result)   Collection Time: 10/24/22  4:55 PM   Specimen: BLOOD LEFT FOREARM  Result Value Ref Range Status   Specimen Description   Final    BLOOD LEFT FOREARM Performed at [facility], [address], [place], [place] [address]    Special Requests   Final    BOTTLES DRAWN AEROBIC AND ANAEROBIC Blood Culture adequate volume Performed at [facility], [address] [place], [place] [address]    Culture   Final    NO GROWTH 2 DAYS Performed at [facility], [address], [place], [place] [address]    Report Status PENDING  Incomplete  Blood culture (routine x 2)     Status: None (Preliminary result)   Collection Time: 10/24/22  4:58 PM   Specimen: BLOOD RIGHT FOREARM  Result Value Ref Range Status   Specimen Description   Final    BLOOD RIGHT FOREARM Performed at [facility], [address] [place], [place] [address]    Special Requests   Final    BOTTLES DRAWN AEROBIC AND ANAEROBIC Blood Culture adequate volume Performed at [facility], [address] [place], [place] [address]    Culture   Final    NO GROWTH 2 DAYS Performed at  [facility], [place] [address] [place], [place] [address]    Report Status PENDING  Incomplete  SARS Coronavirus 2 by RT PCR (hospital order, performed in [facility] hospital lab) *cepheid single result test* Anterior Nasal Swab     Status: None   Collection Time: 10/24/22  6:30 PM   Specimen: Anterior Nasal Swab  Result Value Ref Range Status   SARS Coronavirus 2 by RT PCR NEGATIVE NEGATIVE Final    Comment: (NOTE) SARS-CoV-2 target nucleic acids are NOT DETECTED.  The SARS-CoV-2 RNA is generally detectable in upper and lower respiratory specimens during the acute phase of infection. The lowest concentration of SARS-CoV-2 viral copies this assay can detect is 250 copies / mL. A negative result does not preclude SARS-CoV-2 infection and should not be used as the sole basis for treatment or other patient management decisions.  A negative result may occur with improper specimen collection / handling, submission of specimen other than nasopharyngeal swab, presence of viral  mutation(s) within the areas targeted by this assay, and inadequate number of viral copies (<250 copies / mL). A negative result must be combined with clinical observations, patient history, and epidemiological information.  Fact Sheet for Patients:   https://www.patel.info/  Fact Sheet for Healthcare Providers: https://hall.com/  This test is not yet approved or  cleared by the [place] FDA and has been authorized for detection and/or diagnosis of SARS-CoV-2 by FDA under an Emergency Use Authorization (EUA).  This EUA will remain in effect (meaning this test can be used) for the duration of the COVID-19 declaration under Section 564(b)(1) of the Act, 21 U.S.C. section 360bbb-3(b)(1), unless the authorization is terminated or revoked sooner.  Performed at [facility], [address] [place], [place] [address]          Radiology Studies: ECHOCARDIOGRAM COMPLETE  Result Date: 10/25/2022    ECHOCARDIOGRAM REPORT   Patient Name:   Douglas Matthews Date of Exam: 10/25/2022 Medical Rec #:  [id number]        Height:       68.0 in Accession #:    [id number]       Weight:       146.8 lb Date of Birth:  Apr 11, 1935       BSA:          1.792 m Patient Age:    78 years         BP:           153/103 mmHg Patient Gender: M                HR:           93 bpm. Exam Location:  Inpatient Procedure: 2D Echo, Cardiac Doppler and Color Doppler Indications:    Atrial fibrillation  History:        Patient has prior history of Echocardiogram examinations, most                 recent 05/06/2016. CHF, Arrythmias:LBBB, Signs/Symptoms:Shortness                 of Breath; Risk Factors:Dyslipidemia and Hypertension.  Sonographer:    Clayton Lefort RDCS (AE) Referring Phys: Shela Leff  Sonographer Comments: Suboptimal parasternal window. IMPRESSIONS  1. There is substantial septal-lateral left ventricular dyssynchrony. Left ventricular ejection  fraction, by estimation, is 25 to 30%. The left ventricle has severely decreased function. The left ventricle demonstrates global hypokinesis. There is moderate concentric left ventricular hypertrophy.  Left ventricular diastolic function could not be evaluated.  2. Right ventricular systolic function is moderately reduced. The right ventricular size is normal. There is normal pulmonary artery systolic pressure. The estimated right ventricular systolic pressure is 123456 mmHg.  3. Left atrial size was mild to moderately dilated.  4. Right atrial size was mildly dilated.  5. The mitral valve is normal in structure. Mild to moderate mitral valve regurgitation.  6. The aortic valve has an indeterminate number of cusps. There is moderate calcification of the aortic valve. There is moderate thickening of the aortic valve. Mild to moderate aortic valve stenosis. Gradients are low due to poor LV function.  7. The inferior vena cava is dilated in size with <50% respiratory variability, suggesting right atrial pressure of 15 mmHg. Comparison(s): The left ventricular function is significantly worse. The right ventricular systolic function is worse. FINDINGS  Left Ventricle: There is substantial septal-lateral left ventricular dyssynchrony. Left ventricular ejection fraction, by estimation, is 25 to 30%. The left ventricle has severely decreased function. The left ventricle demonstrates global hypokinesis. The left ventricular internal cavity size was normal in size. There is moderate concentric left ventricular hypertrophy. Abnormal (paradoxical) septal motion, consistent with left bundle branch block. Left ventricular diastolic function could not be evaluated due to atrial fibrillation. Left ventricular diastolic function could not be evaluated. Right Ventricle: The right ventricular size is normal. No increase in right ventricular wall thickness. Right ventricular systolic function is moderately reduced. There is normal  pulmonary artery systolic pressure. The tricuspid regurgitant velocity is 2.02 m/s, and with an assumed right atrial pressure of 15 mmHg, the estimated right ventricular systolic pressure is 123456 mmHg. Left Atrium: Left atrial size was mild to moderately dilated. Right Atrium: Right atrial size was mildly dilated. Pericardium: Trivial pericardial effusion is present. Mitral Valve: The mitral valve is normal in structure. Mild to moderate mitral valve regurgitation, with centrally-directed jet. MV peak gradient, 6.5 mmHg. The mean mitral valve gradient is 3.0 mmHg. Tricuspid Valve: The tricuspid valve is normal in structure. Tricuspid valve regurgitation is trivial. Aortic Valve: The aortic valve has an indeterminant number of cusps. There is moderate calcification of the aortic valve. There is moderate thickening of the aortic valve. Aortic valve regurgitation is not visualized. Mild to moderate aortic stenosis is present. Aortic valve mean gradient measures 7.0 mmHg. Aortic valve peak gradient measures 12.0 mmHg. Aortic valve area, by VTI measures 1.47 cm. Pulmonic Valve: The pulmonic valve was grossly normal. Pulmonic valve regurgitation is not visualized. Aorta: The aortic root is normal in size and structure. Venous: The inferior vena cava is dilated in size with less than 50% respiratory variability, suggesting right atrial pressure of 15 mmHg. IAS/Shunts: No atrial level shunt detected by color flow Doppler.  LEFT VENTRICLE PLAX 2D LVIDd:         4.70 cm LV PW:         1.60 cm LV IVS:        1.40 cm LVOT diam:     2.05 cm LV SV:         39 LV SV Index:   22 LVOT Area:     3.30 cm  LV Volumes (MOD) LV vol d, MOD A2C: 153.0 ml LV vol d, MOD A4C: 109.0 ml LV vol s, MOD A2C: 83.1 ml LV vol s, MOD A4C: 85.1 ml LV SV MOD A2C:     69.9 ml LV SV MOD A4C:     109.0 ml LV SV MOD  BP:      46.4 ml RIGHT VENTRICLE            IVC RV Basal diam:  3.10 cm    IVC diam: 2.20 cm RV S prime:     7.62 cm/s TAPSE (M-mode): 1.6 cm  LEFT ATRIUM             Index        RIGHT ATRIUM           Index LA diam:        4.30 cm 2.40 cm/m   RA Area:     18.60 cm LA Vol (A2C):   63.7 ml 35.55 ml/m  RA Volume:   54.90 ml  30.64 ml/m LA Vol (A4C):   45.1 ml 25.17 ml/m LA Biplane Vol: 56.2 ml 31.36 ml/m  AORTIC VALVE AV Area (Vmax):    1.71 cm AV Area (Vmean):   1.59 cm AV Area (VTI):     1.47 cm AV Vmax:           173.00 cm/s AV Vmean:          126.000 cm/s AV VTI:            0.268 m AV Peak Grad:      12.0 mmHg AV Mean Grad:      7.0 mmHg LVOT Vmax:         89.70 cm/s LVOT Vmean:        60.733 cm/s LVOT VTI:          0.120 m LVOT/AV VTI ratio: 0.45  AORTA Ao Root diam: 3.20 cm MITRAL VALVE              TRICUSPID VALVE MV Peak grad: 6.5 mmHg    TR Peak grad:   16.3 mmHg MV Mean grad: 3.0 mmHg    TR Vmax:        201.68 cm/s MV Vmax:      1.27 m/s MV Vmean:     87.7 cm/s   SHUNTS MR Peak grad: 68.6 mmHg   Systemic VTI:  0.12 m MR Mean grad: 49.0 mmHg   Systemic Diam: 2.05 cm MR Vmax:      414.00 cm/s MR Vmean:     340.0 cm/s Mihai Croitoru MD Electronically signed by Sanda Klein MD Signature Date/Time: 10/25/2022/4:08:36 PM    Final         Scheduled Meds:  apixaban  2.5 mg Oral BID   carvedilol  3.125 mg Oral Once   carvedilol  6.25 mg Oral BID WC   cefdinir  300 mg Oral Daily   empagliflozin  10 mg Oral Daily   hydrALAZINE  10 mg Oral Q8H   isosorbide mononitrate  30 mg Oral Daily   Continuous Infusions:     LOS: 2 days    Time spent: 35 minutes    Barb Merino, MD [facility] Pager [phone]

## 2022-10-27 NOTE — Progress Notes (Signed)
Mobility Specialist - Progress Note   10/27/22 1656  Mobility  Activity Ambulated with assistance in hallway  Level of Assistance Standby assist, set-up cues, supervision of patient - no hands on  Assistive Device None  Distance Ambulated (ft) 180 ft  Activity Response Tolerated well  Mobility Referral Yes  $Mobility charge 1 Mobility    During mobility:152 HR  Pt received in bed and agreeable to mobility. Pt hand no complaints throughout ambulation. Pt was returned to bed with all needs met.    Franki Monte  Mobility Specialist Please contact via Solicitor or Rehab office at [phone]

## 2022-10-27 NOTE — TOC Benefit Eligibility Note (Signed)
Patient Product/process development scientist completed.    The patient is currently admitted and upon discharge could be taking Eliquis.  The current 30 day co-pay is $135.45.     Patient Product/process development scientist completed.    The patient is currently admitted and upon discharge could be taking Jardiance.  The current 30 day co-pay is $143.31.      Patient Product/process development scientist completed.    The patient is currently admitted and upon discharge could be taking [place].  The current 30 day co-pay is $136.54.   The patient is insured through [employer]

## 2022-10-27 NOTE — Progress Notes (Signed)
Rounding Note    Patient Name: Douglas Matthews Date of Encounter: 10/27/2022  [facility] Cardiologist:   New to Douglas Matthews   Subjective   87 year old gentleman with a history of hypertension, hyperlipidemia and new onset atrial fibs/atrial flutter associated with pneumonia.  He has a new left bundle branch block.  Rate is currently well-controlled on a diltiazem drip.  Echocardiogram from November 25 revealed severely reduced left ventricular systolic function with an EF of 25 to 30%.  We were not able to evaluate diastolic function  Right ventricular function is moderately reduced.  RV size is normal.  Pulmonary artery pressure is normal. Mild to moderate mitral regurgitation.  Mild to moderate aortic stenosis.  Creatinine is elevated at 2.15.  He is anemic with a hemoglobin of 9.3.  Continues to have episodes of tachycardia , especially when he is moving around     Inpatient Medications    Scheduled Meds:  apixaban  2.5 mg Oral BID   carvedilol  3.125 mg Oral BID WC   cefdinir  300 mg Oral Daily   hydrALAZINE  10 mg Oral Q8H   isosorbide mononitrate  30 mg Oral Daily   Continuous Infusions:   PRN Meds: acetaminophen **OR** acetaminophen   Vital Signs    Vitals:   10/26/22 1545 10/26/22 2014 10/27/22 0431 10/27/22 0945  BP: 128/85 (!) 150/75 124/70 123/87  Pulse: 92 74 (!) 57 94  Resp: 20 18    Temp: 98 F (36.7 C) 97.7 F (36.5 C) 98.3 F (36.8 C)   TempSrc: Oral Oral Oral   SpO2: 93% 97% 94%   Weight:   65.5 kg   Height:       No intake or output data in the 24 hours ending 10/27/22 1010     10/27/2022    4:31 AM 10/25/2022    1:03 AM 10/24/2022    3:40 PM  Last 3 Weights  Weight (lbs) 144 lb 4.8 oz 146 lb 12.8 oz 150 lb  Weight (kg) 65.454 kg 66.588 kg 68.04 kg      Telemetry    Afib ,  HR is well controlled  Personally Reviewed  ECG     - Personally Reviewed  Physical Exam   Physical Exam: Blood pressure 123/87, pulse  94, temperature 98.3 F (36.8 C), temperature source Oral, resp. rate 18, height 5\' 8"  (1.727 m), weight 65.5 kg, SpO2 94 %.       GEN:  elderly man,   in no acute distress HEENT: Normal NECK: No JVD; No carotid bruits LYMPHATICS: No lymphadenopathy CARDIAC:irreg. Irreg.  RESPIRATORY:  few rales, rhonchi posteriorly  ABDOMEN: Soft, non-tender, non-distended MUSCULOSKELETAL:  No edema; No deformity  SKIN: Warm and dry NEUROLOGIC:  Alert and oriented x 3    Labs    High Sensitivity Troponin:   Recent Labs  Lab 10/24/22 1546 10/24/22 1830  TROPONINIHS 61* 57*      Chemistry Recent Labs  Lab 10/24/22 1546 10/25/22 0615 10/27/22 0219  NA 140 141 141  K 5.0 5.1 4.5  CL 109 114* 111  CO2 17* 15* 17*  GLUCOSE 111* 102* 108*  BUN 62* 57* 65*  CREATININE 2.41* 2.15* 2.43*  CALCIUM 9.4 9.1 8.8*  MG  --  2.2  --   GFRNONAA 26* 30* 25*  ANIONGAP 14 12 13      Lipids No results for input(s): "CHOL", "TRIG", "HDL", "LABVLDL", "LDLCALC", "CHOLHDL" in the last 168 hours.  Hematology Recent Labs  Lab 10/25/22 0615 10/26/22 0200 10/27/22 0219  WBC 6.3 7.1 7.2  RBC 3.08* 3.13* 2.81*  HGB 9.0* 9.3* 8.2*  HCT 27.1* 27.4* 24.6*  MCV 88.0 87.5 87.5  MCH 29.2 29.7 29.2  MCHC 33.2 33.9 33.3  RDW 15.4 15.5 15.5  PLT 235 258 212    Thyroid  Recent Labs  Lab 10/25/22 0615  TSH 4.260     BNP Recent Labs  Lab 10/24/22 1603  BNP 721.7*     DDimer  Recent Labs  Lab 10/24/22 1546  DDIMER 6.07*      Radiology    ECHOCARDIOGRAM COMPLETE  Result Date: 10/25/2022    ECHOCARDIOGRAM REPORT   Patient Name:   Douglas Matthews Date of Exam: 10/25/2022 Medical Rec #:  [id number]        Height:       68.0 in Accession #:    [id number]       Weight:       146.8 lb Date of Birth:  [birth year]       BSA:          1.792 m Patient Age:    76 years         BP:           153/103 mmHg Patient Gender: M                HR:           93 bpm. Exam Location:  Inpatient Procedure: 2D  Echo, Cardiac Doppler and Color Doppler Indications:    Atrial fibrillation  History:        Patient has prior history of Echocardiogram examinations, most                 recent 05/06/2016. CHF, Arrythmias:LBBB, Signs/Symptoms:Shortness                 of Breath; Risk Factors:Dyslipidemia and Hypertension.  Sonographer:    Clayton Lefort RDCS (AE) Referring Phys: Shela Leff  Sonographer Comments: Suboptimal parasternal window. IMPRESSIONS  1. There is substantial septal-lateral left ventricular dyssynchrony. Left ventricular ejection fraction, by estimation, is 25 to 30%. The left ventricle has severely decreased function. The left ventricle demonstrates global hypokinesis. There is moderate concentric left ventricular hypertrophy. Left ventricular diastolic function could not be evaluated.  2. Right ventricular systolic function is moderately reduced. The right ventricular size is normal. There is normal pulmonary artery systolic pressure. The estimated right ventricular systolic pressure is 123456 mmHg.  3. Left atrial size was mild to moderately dilated.  4. Right atrial size was mildly dilated.  5. The mitral valve is normal in structure. Mild to moderate mitral valve regurgitation.  6. The aortic valve has an indeterminate number of cusps. There is moderate calcification of the aortic valve. There is moderate thickening of the aortic valve. Mild to moderate aortic valve stenosis. Gradients are low due to poor LV function.  7. The inferior vena cava is dilated in size with <50% respiratory variability, suggesting right atrial pressure of 15 mmHg. Comparison(s): The left ventricular function is significantly worse. The right ventricular systolic function is worse. FINDINGS  Left Ventricle: There is substantial septal-lateral left ventricular dyssynchrony. Left ventricular ejection fraction, by estimation, is 25 to 30%. The left ventricle has severely decreased function. The left ventricle demonstrates global  hypokinesis. The left ventricular internal cavity size was normal in size. There is moderate concentric left ventricular hypertrophy. Abnormal (paradoxical) septal motion, consistent with left bundle branch  block. Left ventricular diastolic function could not be evaluated due to atrial fibrillation. Left ventricular diastolic function could not be evaluated. Right Ventricle: The right ventricular size is normal. No increase in right ventricular wall thickness. Right ventricular systolic function is moderately reduced. There is normal pulmonary artery systolic pressure. The tricuspid regurgitant velocity is 2.02 m/s, and with an assumed right atrial pressure of 15 mmHg, the estimated right ventricular systolic pressure is 123456 mmHg. Left Atrium: Left atrial size was mild to moderately dilated. Right Atrium: Right atrial size was mildly dilated. Pericardium: Trivial pericardial effusion is present. Mitral Valve: The mitral valve is normal in structure. Mild to moderate mitral valve regurgitation, with centrally-directed jet. MV peak gradient, 6.5 mmHg. The mean mitral valve gradient is 3.0 mmHg. Tricuspid Valve: The tricuspid valve is normal in structure. Tricuspid valve regurgitation is trivial. Aortic Valve: The aortic valve has an indeterminant number of cusps. There is moderate calcification of the aortic valve. There is moderate thickening of the aortic valve. Aortic valve regurgitation is not visualized. Mild to moderate aortic stenosis is present. Aortic valve mean gradient measures 7.0 mmHg. Aortic valve peak gradient measures 12.0 mmHg. Aortic valve area, by VTI measures 1.47 cm. Pulmonic Valve: The pulmonic valve was grossly normal. Pulmonic valve regurgitation is not visualized. Aorta: The aortic root is normal in size and structure. Venous: The inferior vena cava is dilated in size with less than 50% respiratory variability, suggesting right atrial pressure of 15 mmHg. IAS/Shunts: No atrial level shunt  detected by color flow Doppler.  LEFT VENTRICLE PLAX 2D LVIDd:         4.70 cm LV PW:         1.60 cm LV IVS:        1.40 cm LVOT diam:     2.05 cm LV SV:         39 LV SV Index:   22 LVOT Area:     3.30 cm  LV Volumes (MOD) LV vol d, MOD A2C: 153.0 ml LV vol d, MOD A4C: 109.0 ml LV vol s, MOD A2C: 83.1 ml LV vol s, MOD A4C: 85.1 ml LV SV MOD A2C:     69.9 ml LV SV MOD A4C:     109.0 ml LV SV MOD BP:      46.4 ml RIGHT VENTRICLE            IVC RV Basal diam:  3.10 cm    IVC diam: 2.20 cm RV S prime:     7.62 cm/s TAPSE (M-mode): 1.6 cm LEFT ATRIUM             Index        RIGHT ATRIUM           Index LA diam:        4.30 cm 2.40 cm/m   RA Area:     18.60 cm LA Vol (A2C):   63.7 ml 35.55 ml/m  RA Volume:   54.90 ml  30.64 ml/m LA Vol (A4C):   45.1 ml 25.17 ml/m LA Biplane Vol: 56.2 ml 31.36 ml/m  AORTIC VALVE AV Area (Vmax):    1.71 cm AV Area (Vmean):   1.59 cm AV Area (VTI):     1.47 cm AV Vmax:           173.00 cm/s AV Vmean:          126.000 cm/s AV VTI:            0.268  m AV Peak Grad:      12.0 mmHg AV Mean Grad:      7.0 mmHg LVOT Vmax:         89.70 cm/s LVOT Vmean:        60.733 cm/s LVOT VTI:          0.120 m LVOT/AV VTI ratio: 0.45  AORTA Ao Root diam: 3.20 cm MITRAL VALVE              TRICUSPID VALVE MV Peak grad: 6.5 mmHg    TR Peak grad:   16.3 mmHg MV Mean grad: 3.0 mmHg    TR Vmax:        201.68 cm/s MV Vmax:      1.27 m/s MV Vmean:     87.7 cm/s   SHUNTS MR Peak grad: 68.6 mmHg   Systemic VTI:  0.12 m MR Mean grad: 49.0 mmHg   Systemic Diam: 2.05 cm MR Vmax:      414.00 cm/s MR Vmean:     340.0 cm/s Rachelle Hora Croitoru MD Electronically signed by Thurmon Fair MD Signature Date/Time: 10/25/2022/4:08:36 PM    Final     Cardiac Studies      Patient Profile     87 y.o. male    Assessment & Plan     1.  Atrial fib:  rate is currently well controlled.   HR is fairly well controlled on Coreg 3.125 BID  Will increase it to Coreg 6.25 BID On eliquis 2.5 BID    2.   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 Add Jardiance 10 mg a day    Creatinine is elevated.  Will need to be cautious using Entresto or ARB.      3.  HTN:   BP remains elevated.   Adding hydralazine and Imdur  We would need to be cautious about adding ARB or Entresto            For questions or updates, please contact [facility] Please consult [email or web address] for contact info under        Signed, Kristeen Miss, MD  10/27/2022, 10:10 AM

## 2022-10-27 NOTE — Progress Notes (Signed)
CARDIAC REHAB PHASE I    Stopped by to see pt for walk and education. Pt is off floor in radiology. Spoke with [facility] RN regarding ambulating pt ,upon his return to floor with mobility team. Will continue to follow.   [id number]    Woodroe Chen, RN BSN 10/27/2022 2:12 PM

## 2022-10-27 NOTE — Progress Notes (Signed)
CCMD notified RN of pt heart rate ^ 150s-160s.  Heather, Consulting civil engineer and [place], NT in with pt while heart rate ^.  Pt was attempting to use bedside commode.  Pt denied any c/o.  Returned to Afib controlled rate in 80s when settled in bed.  Patient currently resting with eyes closed.  Will continue to monitor pt closely.

## 2022-10-27 NOTE — Discharge Instructions (Signed)

## 2022-10-28 DIAGNOSIS — I4891 Unspecified atrial fibrillation: Secondary | ICD-10-CM | POA: Diagnosis not present

## 2022-10-28 DIAGNOSIS — I5043 Acute on chronic combined systolic (congestive) and diastolic (congestive) heart failure: Secondary | ICD-10-CM | POA: Diagnosis not present

## 2022-10-28 LAB — CBC
HCT: 26.3 % — ABNORMAL LOW (ref 39.0–52.0)
Hemoglobin: 8.6 g/dL — ABNORMAL LOW (ref 13.0–17.0)
MCH: 29.3 pg (ref 26.0–34.0)
MCHC: 32.7 g/dL (ref 30.0–36.0)
MCV: 89.5 fL (ref 80.0–100.0)
Platelets: 245 10*3/uL (ref 150–400)
RBC: 2.94 MIL/uL — ABNORMAL LOW (ref 4.22–5.81)
RDW: 15.5 % (ref 11.5–15.5)
WBC: 7.1 10*3/uL (ref 4.0–10.5)
nRBC: 0 % (ref 0.0–0.2)

## 2022-10-28 LAB — BASIC METABOLIC PANEL
Anion gap: 11 (ref 5–15)
BUN: 52 mg/dL — ABNORMAL HIGH (ref 8–23)
CO2: 18 mmol/L — ABNORMAL LOW (ref 22–32)
Calcium: 8.9 mg/dL (ref 8.9–10.3)
Chloride: 111 mmol/L (ref 98–111)
Creatinine, Ser: 2.46 mg/dL — ABNORMAL HIGH (ref 0.61–1.24)
GFR, Estimated: 25 mL/min — ABNORMAL LOW (ref >60)
Glucose, Bld: 103 mg/dL — ABNORMAL HIGH (ref 70–99)
Potassium: 4.2 mmol/L (ref 3.5–5.1)
Sodium: 140 mmol/L (ref 135–145)

## 2022-10-28 MED ORDER — FUROSEMIDE 10 MG/ML IJ SOLN
40.0000 mg | Freq: Two times a day (BID) | INTRAMUSCULAR | Status: AC
  Administered 2022-10-28 – 2022-10-29 (×3): 40 mg via INTRAVENOUS
  Filled 2022-10-28 (×3): qty 4

## 2022-10-28 NOTE — Progress Notes (Signed)
Mobility Specialist - Progress Note   10/28/22 1517  Mobility  Activity Ambulated with assistance to bathroom  Level of Assistance Standby assist, set-up cues, supervision of patient - no hands on  Assistive Device None  Distance Ambulated (ft) 20 ft  Activity Response Tolerated well  Mobility Referral Yes  $Mobility charge 1 Mobility   Pt was received on EOB and requesting assistance to BR. Pt had no complaints. Pt was returned to bed with all needs met.   Franki Monte  Mobility Specialist Please contact via Solicitor or Rehab office at [phone]

## 2022-10-28 NOTE — Progress Notes (Signed)
PROGRESS NOTE    Douglas Matthews  [id number] DOB: 01-19-1935 DOA: 10/24/2022 PCP: [place], [name], MD    Brief Narrative:  87 year old gentleman with history of hypertension, hyperlipidemia, CKD stage IIIb presented to the emergency room with shortness of breath for about 2 days, however he had some exertional dyspnea for a few weeks.  In the emergency room he was on 2 L oxygen.  Creatinine 2.4 which is about stable.  Slightly elevated troponins.  D-dimer was 6.  BNP was 721.  Chest x-ray showed cardiomegaly and right lower lobe infiltrate.  Patient also was found with new left bundle branch block. Subsequently he was found with rapid A-fib, echocardiogram with ejection fraction 20 to 25%.   Assessment & Plan:   New onset A-fib with RVR: Presented with a heart rate of 140 and treated with Cardizem drip.   Heart rate remains elevated with minimal activity.   Currently on Coreg 6.25 twice daily today.  Therapeutic on Eliquis. D-dimer was significantly elevated.  VQ scan with low probability of pulmonary embolism.   TSH was 4.2.  Cardiology following.  Suspected community-acquired pneumonia with acute hypoxemic respiratory failure: Cultures are negative.  Afebrile.  Without leukocytosis.  His symptomatology is likely due to congestive heart failure. Complete 5 days of antibiotic therapy.  Acute on chronic congestive heart failure: Previous known ejection fraction of 50%, repeat echocardiogram with ejection fraction 20%.  Still significantly short of breath. With new onset left bundle branch block, patient in need of ischemic evaluation, however avoiding contrast due to renal dysfunction. Already on carvedilol and Jardiance. Starting on Lasix 40 mg twice daily, intake output monitoring.  Renal function monitoring.  CKD stage IIIb: His renal functions are at about his baseline.  Monitor on treatment.  Now on diuretics.   DVT prophylaxis: apixaban (ELIQUIS) tablet 2.5 mg Start:  10/26/22 1000 apixaban (ELIQUIS) tablet 2.5 mg   Code Status: Full code Family Communication: None today Disposition Plan: Status is: Inpatient Remains inpatient appropriate because: To control heart rate, diuresis.     Consultants:  Cardiology  Procedures:  None  Antimicrobials:  Rocephin azithromycin 11/25---   Subjective: Patient seen and examined.  Himself denies any complaints.  Episode of rapid A-fib but not sustained. Objective: Vitals:   10/28/22 1024 10/28/22 1025 10/28/22 1026 10/28/22 1226  BP:    121/79  Pulse: 94 99 98 (!) 111  Resp:    19  Temp:    (!) 97.3 F (36.3 C)  TempSrc:    Oral  SpO2: 97% 97% 96% 92%  Weight:      Height:        Intake/Output Summary (Last 24 hours) at 10/28/2022 1258 Last data filed at 10/27/2022 1615 Gross per 24 hour  Intake --  Output 425 ml  Net -425 ml    Filed Weights   10/24/22 1540 10/25/22 0103 10/27/22 0431  Weight: 68 kg 66.6 kg 65.5 kg    Examination:  General exam: Appears calm and comfortable, on 2 L oxygen at rest. Respiratory system: Clear to auscultation. Respiratory effort normal.  No added sounds. Cardiovascular system: S1 & S2 heard, irregularly irregular.  No JVD, murmurs, rubs, gallops or clicks. No pedal edema. Gastrointestinal system: Abdomen is nondistended, soft and nontender. No organomegaly or masses felt. Normal bowel sounds heard. Central nervous system: Alert and oriented. No focal neurological deficits. Extremities: Symmetric 5 x 5 power.    Data Reviewed: I have personally reviewed following labs and imaging studies  CBC: Recent Labs  Lab 10/25/22 0205 10/25/22 0615 10/26/22 0200 10/27/22 0219 10/28/22 0244  WBC 6.3 6.3 7.1 7.2 7.1  HGB 9.4* 9.0* 9.3* 8.2* 8.6*  HCT 28.1* 27.1* 27.4* 24.6* 26.3*  MCV 88.4 88.0 87.5 87.5 89.5  PLT 241 235 258 212 [id number]   Basic Metabolic Panel: Recent Labs  Lab 10/24/22 1546 10/25/22 0615 10/27/22 0219  NA 140 141 141  K 5.0 5.1 4.5   CL 109 114* 111  CO2 17* 15* 17*  GLUCOSE 111* 102* 108*  BUN 62* 57* 65*  CREATININE 2.41* 2.15* 2.43*  CALCIUM 9.4 9.1 8.8*  MG  --  2.2  --    GFR: Estimated Creatinine Clearance: 20.6 mL/min (A) (by C-Matthews formula based on SCr of 2.43 mg/dL (H)). Liver Function Tests: No results for input(s): "AST", "ALT", "ALKPHOS", "BILITOT", "PROT", "ALBUMIN" in the last 168 hours. No results for input(s): "LIPASE", "AMYLASE" in the last 168 hours. No results for input(s): "AMMONIA" in the last 168 hours. Coagulation Profile: No results for input(s): "INR", "PROTIME" in the last 168 hours. Cardiac Enzymes: No results for input(s): "CKTOTAL", "CKMB", "CKMBINDEX", "TROPONINI" in the last 168 hours. BNP (last 3 results) No results for input(s): "PROBNP" in the last 8760 hours. HbA1C: No results for input(s): "HGBA1C" in the last 72 hours. CBG: Recent Labs  Lab 10/25/22 0734 10/25/22 1211 10/25/22 1624  GLUCAP 93 110* 133*   Lipid Profile: No results for input(s): "CHOL", "HDL", "LDLCALC", "TRIG", "CHOLHDL", "LDLDIRECT" in the last 72 hours. Thyroid Function Tests: No results for input(s): "TSH", "T4TOTAL", "FREET4", "T3FREE", "THYROIDAB" in the last 72 hours.  Anemia Panel: No results for input(s): "VITAMINB12", "FOLATE", "FERRITIN", "TIBC", "IRON", "RETICCTPCT" in the last 72 hours. Sepsis Labs: Recent Labs  Lab 10/24/22 1633  LATICACIDVEN 1.2    Recent Results (from the past 240 hour(s))  Blood culture (routine x 2)     Status: None (Preliminary result)   Collection Time: 10/24/22  4:55 PM   Specimen: BLOOD LEFT FOREARM  Result Value Ref Range Status   Specimen Description   Final    BLOOD LEFT FOREARM Performed at [facility], [place] [address], [place], [place] [address]    Special Requests   Final    BOTTLES DRAWN AEROBIC AND ANAEROBIC Blood Culture adequate volume Performed at [facility], [address], [place], [place] [address]    Culture    Final    NO GROWTH 3 DAYS Performed at [facility], [place] [address], [place], [place] [address]    Report Status PENDING  Incomplete  Blood culture (routine x 2)     Status: None (Preliminary result)   Collection Time: 10/24/22  4:58 PM   Specimen: BLOOD RIGHT FOREARM  Result Value Ref Range Status   Specimen Description   Final    BLOOD RIGHT FOREARM Performed at [facility], [place] [address] [place], [place] [address]    Special Requests   Final    BOTTLES DRAWN AEROBIC AND ANAEROBIC Blood Culture adequate volume Performed at [facility], [address] [place], [place] [address]    Culture   Final    NO GROWTH 3 DAYS Performed at [facility], [place] [address] [place], [place] [address]    Report Status PENDING  Incomplete  SARS Coronavirus 2 by RT PCR (hospital order, performed in [facility] hospital lab) *cepheid single result test* Anterior Nasal Swab     Status: None  Collection Time: 10/24/22  6:30 PM   Specimen: Anterior Nasal Swab  Result Value Ref Range Status   SARS Coronavirus 2 by RT PCR NEGATIVE NEGATIVE Final    Comment: (NOTE) SARS-CoV-2 target nucleic acids are NOT DETECTED.  The SARS-CoV-2 RNA is generally detectable in upper and lower respiratory specimens during the acute phase of infection. The lowest concentration of SARS-CoV-2 viral copies this assay can detect is 250 copies / mL. A negative result does not preclude SARS-CoV-2 infection and should not be used as the sole basis for treatment or other patient management decisions.  A negative result may occur with improper specimen collection / handling, submission of specimen other than nasopharyngeal swab, presence of viral mutation(s) within the areas targeted by this assay, and inadequate number of viral copies (<250 copies / mL). A negative result must be combined with clinical observations, patient history, and epidemiological information.  Fact Sheet for  Patients:   RoadLapTop.co.za  Fact Sheet for Healthcare Providers: http://kim-miller.com/  This test is not yet approved or  cleared by the [place] FDA and has been authorized for detection and/or diagnosis of SARS-CoV-2 by FDA under an Emergency Use Authorization (EUA).  This EUA will remain in effect (meaning this test can be used) for the duration of the COVID-19 declaration under Section 564(b)(1) of the Act, 21 U.S.C. section 360bbb-3(b)(1), unless the authorization is terminated or revoked sooner.  Performed at [facility], [address], [place], [place] [address]          Radiology Studies: NM Pulmonary Perfusion  Result Date: 10/27/2022 CLINICAL DATA:  Elevated D-dimer. Chronic short of breath. Congestive heart failure. EXAM: NUCLEAR MEDICINE PERFUSION LUNG SCAN TECHNIQUE: Perfusion images were obtained in multiple projections after intravenous injection of radiopharmaceutical. RADIOPHARMACEUTICALS:  4.0 mCi Tc-41m MAA COMPARISON:  Radiograph 10/27/2022 FINDINGS: No wedge-shaped peripheral perfusion defect within LEFT or RIGHT lung to suggest acute pulmonary embolism. IMPRESSION: No evidence acute pulmonary embolism. Electronically Signed   By: Genevive Bi M.D.   On: 10/27/2022 15:50   DG Chest 2 View  Result Date: 10/27/2022 CLINICAL DATA:  Atrial fibrillation EXAM: CHEST - 2 VIEW COMPARISON:  10/24/2022 FINDINGS: Normal cardiac silhouette. There are bilateral pleural effusions increased from comparison exam. Effusions are moderate layering. Upper lungs clear. No pneumothorax. IMPRESSION: Increasing bilateral pleural effusions. Electronically Signed   By: Genevive Bi M.D.   On: 10/27/2022 14:19        Scheduled Meds:  apixaban  2.5 mg Oral BID   carvedilol  3.125 mg Oral Once   carvedilol  6.25 mg Oral BID WC   cefdinir  300 mg Oral Daily   empagliflozin  10 mg Oral Daily    furosemide  40 mg Intravenous BID   hydrALAZINE  10 mg Oral Q8H   isosorbide mononitrate  30 mg Oral Daily   Continuous Infusions:     LOS: 3 days    Time spent: 35 minutes    Dorcas Carrow, MD [facility] Pager [phone]

## 2022-10-28 NOTE — Progress Notes (Signed)
CARDIAC REHAB PHASE I   PRE:  Rate/Rhythm: 105 A-fib  BP:  Sitting: 105/73      SaO2: 96 RA  MODE:  Ambulation: 200 ft   POST:  Rate/Rhythm: 155 A-fib  BP:  Sitting: 146/103      SaO2: 93 RA   Pt ambulated in hall with standby assist only. Tolerating well with no cp or sob. Max heart rate 155. Returned to bed with call bell and bedside table in reach. HF education including heart healthy low sodium diet, exercise guidelines, HF booklet, risk factors, restrictions and CRP2 reviewed. All questions and concerns addressed. Will refer to [facility] for CRP2. Will continue to follow.   [id number]  Woodroe Chen, RN BSN 10/28/2022 10:00 AM

## 2022-10-28 NOTE — Progress Notes (Addendum)
Rounding Note    Patient Name: Douglas Matthews Date of Encounter: 10/28/2022  [facility] Cardiologist:  Kristeen Miss, MD    Subjective   87 year old gentleman with a history of hypertension, hyperlipidemia and new onset atrial fibs/atrial flutter associated with pneumonia.  He has a new left bundle branch block.  Off Dilt drip and on Coreg 6.25 mg bid  Echocardiogram from November 25 revealed severely reduced left ventricular systolic function with an EF of 25 to 30%.  We were not able to evaluate diastolic function  Right ventricular function is moderately reduced.  RV size is normal.  Pulmonary artery pressure is normal. Mild to moderate mitral regurgitation.  Mild to moderate aortic stenosis.  Creatinine is elevated at 2.43.  He is anemic with a hemoglobin of 8.6.  Continues to have episodes of tachycardia , especially when he is moving around     Inpatient Medications    Scheduled Meds:  apixaban  2.5 mg Oral BID   carvedilol  3.125 mg Oral Once   carvedilol  6.25 mg Oral BID WC   cefdinir  300 mg Oral Daily   empagliflozin  10 mg Oral Daily   hydrALAZINE  10 mg Oral Q8H   isosorbide mononitrate  30 mg Oral Daily   Continuous Infusions:   PRN Meds: acetaminophen **OR** acetaminophen   Vital Signs    Vitals:   10/28/22 1023 10/28/22 1024 10/28/22 1025 10/28/22 1026  BP:      Pulse: 89 94 99 98  Resp:      Temp:      TempSrc:      SpO2: 95% 97% 97% 96%  Weight:      Height:        Intake/Output Summary (Last 24 hours) at 10/28/2022 1047 Last data filed at 10/27/2022 1615 Gross per 24 hour  Intake --  Output 425 ml  Net -425 ml      10/27/2022    4:31 AM 10/25/2022    1:03 AM 10/24/2022    3:40 PM  Last 3 Weights  Weight (lbs) 144 lb 4.8 oz 146 lb 12.8 oz 150 lb  Weight (kg) 65.454 kg 66.588 kg 68.04 kg      Telemetry    Afib ,  HR is controlled at rest, but spikes w/ activity  Personally Reviewed  ECG     None today -  Personally Reviewed  Physical Exam   Physical Exam: Blood pressure (!) 146/103, pulse 98, temperature 97.8 F (36.6 C), temperature source Oral, resp. rate 18, height 5\' 8"  (1.727 m), weight 65.5 kg, SpO2 96 %.       GEN:  elderly man, in no acute distress HEENT: Normal NECK: JVD 10 cm; No carotid bruits LYMPHATICS: No lymphadenopathy CARDIAC:irreg. Irreg.  RESPIRATORY:  rales bases posteriorly  ABDOMEN: Soft, non-tender, non-distended MUSCULOSKELETAL:  No edema; No deformity  SKIN: Warm and dry NEUROLOGIC:  Alert and oriented x 3    Labs    High Sensitivity Troponin:   Recent Labs  Lab 10/24/22 1546 10/24/22 1830  TROPONINIHS 61* 57*     Chemistry Recent Labs  Lab 10/24/22 1546 10/25/22 0615 10/27/22 0219  NA 140 141 141  K 5.0 5.1 4.5  CL 109 114* 111  CO2 17* 15* 17*  GLUCOSE 111* 102* 108*  BUN 62* 57* 65*  CREATININE 2.41* 2.15* 2.43*  CALCIUM 9.4 9.1 8.8*  MG  --  2.2  --   GFRNONAA 26* 30* 25*  ANIONGAP 14 12 13     Lipids No results for input(s): "CHOL", "TRIG", "HDL", "LABVLDL", "LDLCALC", "CHOLHDL" in the last 168 hours.  Hematology Recent Labs  Lab 10/26/22 0200 10/27/22 0219 10/28/22 0244  WBC 7.1 7.2 7.1  RBC 3.13* 2.81* 2.94*  HGB 9.3* 8.2* 8.6*  HCT 27.4* 24.6* 26.3*  MCV 87.5 87.5 89.5  MCH 29.7 29.2 29.3  MCHC 33.9 33.3 32.7  RDW 15.5 15.5 15.5  PLT 258 212 245   Thyroid  Recent Labs  Lab 10/25/22 0615  TSH 4.260    BNP Recent Labs  Lab 10/24/22 1603  BNP 721.7*    DDimer  Recent Labs  Lab 10/24/22 1546  DDIMER 6.07*     Radiology    NM Pulmonary Perfusion  Result Date: 10/27/2022 CLINICAL DATA:  Elevated D-dimer. Chronic short of breath. Congestive heart failure. EXAM: NUCLEAR MEDICINE PERFUSION LUNG SCAN TECHNIQUE: Perfusion images were obtained in multiple projections after intravenous injection of radiopharmaceutical. RADIOPHARMACEUTICALS:  4.0 mCi Tc-16m MAA COMPARISON:  Radiograph 10/27/2022 FINDINGS: No  wedge-shaped peripheral perfusion defect within LEFT or RIGHT lung to suggest acute pulmonary embolism. IMPRESSION: No evidence acute pulmonary embolism. Electronically Signed   By: 10/29/2022 M.D.   On: 10/27/2022 15:50   DG Chest 2 View  Result Date: 10/27/2022 CLINICAL DATA:  Atrial fibrillation EXAM: CHEST - 2 VIEW COMPARISON:  10/24/2022 FINDINGS: Normal cardiac silhouette. There are bilateral pleural effusions increased from comparison exam. Effusions are moderate layering. Upper lungs clear. No pneumothorax. IMPRESSION: Increasing bilateral pleural effusions. Electronically Signed   By: 10/26/2022 M.D.   On: 10/27/2022 14:19    Cardiac Studies      Patient Profile     87 y.o. male    Assessment & Plan     1.  Atrial fib:  rate is currently well controlled at rest.  - on Coreg 6.25 BID but HR still spikes w/ activity On eliquis 2.5 BID -  dose reduced 2nd age, elev Cr    2.   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 - Also on Jardiance 10 mg a day    Creatinine is elevated.  No Entresto or ARB until Cr improves.      3.  HTN:   BP improved after hydralazine and Imdur added We would need to be cautious about adding ARB or Entresto            For questions or updates, please contact [facility] Please consult [email or web address] for contact info under        Signed, 85, PA-C  10/28/2022, 10:47 AM     See separate note by me on same day     10/30/2022, MD  10/28/2022 11:55 AM    [facility] [address] [place],  [address] [place], [place]  [place] Phone: [phone]; Fax: [phone]

## 2022-10-28 NOTE — Progress Notes (Signed)
Rounding Note    Patient Name: Douglas Matthews Date of Encounter: 10/28/2022  [facility] Cardiologist:   New to Douglas Matthews   Subjective   87 year old gentleman with a history of hypertension, hyperlipidemia and new onset atrial fibs/atrial flutter associated with pneumonia.  He has a new left bundle branch block.  Rate is currently well-controlled on a diltiazem drip.  Echocardiogram from November 25 revealed severely reduced left ventricular systolic function with an EF of 25 to 30%.  We were not able to evaluate diastolic function  Right ventricular function is moderately reduced.  RV size is normal.  Pulmonary artery pressure is normal. Mild to moderate mitral regurgitation.  Mild to moderate aortic stenosis.   Creatinine has been increasing Will draw another BMP now   I/O are + 790 cc   Will give lasix IV and follow BMP closely     Inpatient Medications    Scheduled Meds:  apixaban  2.5 mg Oral BID   carvedilol  3.125 mg Oral Once   carvedilol  6.25 mg Oral BID WC   cefdinir  300 mg Oral Daily   empagliflozin  10 mg Oral Daily   hydrALAZINE  10 mg Oral Q8H   isosorbide mononitrate  30 mg Oral Daily   Continuous Infusions:   PRN Meds: acetaminophen **OR** acetaminophen   Vital Signs    Vitals:   10/28/22 1023 10/28/22 1024 10/28/22 1025 10/28/22 1026  BP:      Pulse: 89 94 99 98  Resp:      Temp:      TempSrc:      SpO2: 95% 97% 97% 96%  Weight:      Height:        Intake/Output Summary (Last 24 hours) at 10/28/2022 1138 Last data filed at 10/27/2022 1615 Gross per 24 hour  Intake --  Output 425 ml  Net -425 ml       10/27/2022    4:31 AM 10/25/2022    1:03 AM 10/24/2022    3:40 PM  Last 3 Weights  Weight (lbs) 144 lb 4.8 oz 146 lb 12.8 oz 150 lb  Weight (kg) 65.454 kg 66.588 kg 68.04 kg      Telemetry    Afib ,  HR is well controlled  Personally Reviewed  ECG     - Personally Reviewed  Physical Exam   Physical  Exam: Blood pressure (!) 146/103, pulse 98, temperature 97.8 F (36.6 C), temperature source Oral, resp. rate 18, height 5\' 8"  (1.727 m), weight 65.5 kg, SpO2 96 %.       GEN:  elderly male,  in no acute distress HEENT: Normal NECK: + JVD  LYMPHATICS: No lymphadenopathy CARDIAC: irreg. irreg RESPIRATORY:  reduced breath sounds in R base ABDOMEN: Soft, non-tender, non-distended MUSCULOSKELETAL:  No edema; No deformity  SKIN: Warm and dry NEUROLOGIC:  Alert and oriented x 3    Labs    High Sensitivity Troponin:   Recent Labs  Lab 10/24/22 1546 10/24/22 1830  TROPONINIHS 61* 57*      Chemistry Recent Labs  Lab 10/24/22 1546 10/25/22 0615 10/27/22 0219  NA 140 141 141  K 5.0 5.1 4.5  CL 109 114* 111  CO2 17* 15* 17*  GLUCOSE 111* 102* 108*  BUN 62* 57* 65*  CREATININE 2.41* 2.15* 2.43*  CALCIUM 9.4 9.1 8.8*  MG  --  2.2  --   GFRNONAA 26* 30* 25*  ANIONGAP 14 12 13  Lipids No results for input(s): "CHOL", "TRIG", "HDL", "LABVLDL", "LDLCALC", "CHOLHDL" in the last 168 hours.  Hematology Recent Labs  Lab 10/26/22 0200 10/27/22 0219 10/28/22 0244  WBC 7.1 7.2 7.1  RBC 3.13* 2.81* 2.94*  HGB 9.3* 8.2* 8.6*  HCT 27.4* 24.6* 26.3*  MCV 87.5 87.5 89.5  MCH 29.7 29.2 29.3  MCHC 33.9 33.3 32.7  RDW 15.5 15.5 15.5  PLT 258 212 245    Thyroid  Recent Labs  Lab 10/25/22 0615  TSH 4.260     BNP Recent Labs  Lab 10/24/22 1603  BNP 721.7*     DDimer  Recent Labs  Lab 10/24/22 1546  DDIMER 6.07*      Radiology    NM Pulmonary Perfusion  Result Date: 10/27/2022 CLINICAL DATA:  Elevated D-dimer. Chronic short of breath. Congestive heart failure. EXAM: NUCLEAR MEDICINE PERFUSION LUNG SCAN TECHNIQUE: Perfusion images were obtained in multiple projections after intravenous injection of radiopharmaceutical. RADIOPHARMACEUTICALS:  4.0 mCi Tc-60m MAA COMPARISON:  Radiograph 10/27/2022 FINDINGS: No wedge-shaped peripheral perfusion defect within  LEFT or RIGHT lung to suggest acute pulmonary embolism. IMPRESSION: No evidence acute pulmonary embolism. Electronically Signed   By: Suzy Bouchard M.D.   On: 10/27/2022 15:50   DG Chest 2 View  Result Date: 10/27/2022 CLINICAL DATA:  Atrial fibrillation EXAM: CHEST - 2 VIEW COMPARISON:  10/24/2022 FINDINGS: Normal cardiac silhouette. There are bilateral pleural effusions increased from comparison exam. Effusions are moderate layering. Upper lungs clear. No pneumothorax. IMPRESSION: Increasing bilateral pleural effusions. Electronically Signed   By: Suzy Bouchard M.D.   On: 10/27/2022 14:19    Cardiac Studies      Patient Profile     87 y.o. male    Assessment & Plan     1.  Atrial fib:  HR is faster   Continue coreg.  Would prefer to not use Digoxin ( increasing creatinine) or diltiazem ( EF 25%)   On eliquis 2.5 BID    2.   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 Add Jardiance 10 mg a day  Add lasix for the next several days to see if it helps    Creatinine is elevated.  Will need to be cautious using Entresto or ARB.      3.  HTN:    adding lasix            For questions or updates, please contact [facility] Please consult [email or web address] for contact info under        Signed, Mertie Moores, MD  10/28/2022, 11:38 AM

## 2022-10-28 NOTE — Progress Notes (Signed)
   Heart Failure Stewardship Pharmacist Progress Note   PCP: [place], [name], MD PCP-Cardiologist: Kristeen Miss, MD    HPI:  Douglas Matthews is a 87 y.o. male with medical history significant of hypertension, hyperlipidemia, CKD stage IIIb, Paget's disease, and BPH who presented with worsening shortness of breath x 2 days.  He was found to be in new onset atrial fibrillation/flutter with RVR with rate in the 140s.  Cardiology noted presentation not consistent with NSTEMI.  Afib was initially managed with diltiazem, however was stopped after echocardiogram 11/25 showed LVEF of 25-30% with moderately reduced RV, mild to moderate mitral regurgitation and aortic stenosis. LV noted to be dyssynchronous.    Current HF Medications: Beta Blocker: Coreg 6.25 mg BID SGLT2i: Jardiance 10 mg QD Other: Hydralazine 10 mg every 8 hours, Imdur 30 mg QD  Prior to admission HF Medications: None  Pertinent Lab Values: Serum creatinine 2.46, BUN 52, Potassium 4.2, Sodium 140, BNP 721.7, Magnesium 2.2   Vital Signs: Weight: 144.3 lbs (admission weight: 146.8 lbs) Blood pressure: 100-140/70s  Heart rate: 100s in afib I/O: -even yesterday; net positive this admission, doubt I/O accuracy  Medication Assistance / Insurance Benefits Check: Does the patient have prescription insurance?  Yes Type of insurance plan: Medicare  Outpatient Pharmacy:  [facility] to admission [facility] Is the patient willing to use [facility] TOC pharmacy at discharge? Yes Is the patient willing to transition their outpatient pharmacy to utilize a [facility] outpatient pharmacy?   No    Assessment: 1. New onset systolic CHF (LVEF 25-30%), due to non-ICM. NYHA class I-II symptoms. - Maintain strict I/Os, daily weights, Mg >2, and K >4. - Creatinine is elevated, but stable since starting Jardiance yesterday. Still unable to add RAASi or spironolactone. - BP is labile. No changes today to allow for renal perfusion. -  Patient is asymptomatic today, with no shortness of breath or peripheral edema. Cardiology noted JVD and CXR yesterday showed pleural effusion. Lasix ordered by cardiology.   Plan: 1) Medication changes recommended at this time: - None at this time  2) Patient assistance: - Will perform copay checks tomorrow  3)  Education  - To be completed prior to discharge  Thank you for allowing pharmacy to participate in this patient's care.  Enos Fling, PharmD PGY2 Pharmacy Resident 10/28/2022 2:42 PM Check [email or web address] for unit specific pharmacy number

## 2022-10-29 ENCOUNTER — Encounter (HOSPITAL_COMMUNITY)
Admission: EM | Disposition: A | Discharge status: Home / Self Care | Payer: Self-pay | Source: Home / Self Care | Attending: Internal Medicine

## 2022-10-29 ENCOUNTER — Inpatient Hospital Stay (HOSPITAL_COMMUNITY): Payer: Medicare Other

## 2022-10-29 ENCOUNTER — Inpatient Hospital Stay (HOSPITAL_COMMUNITY): Payer: Medicare Other | Admitting: Anesthesiology

## 2022-10-29 ENCOUNTER — Encounter (HOSPITAL_COMMUNITY): Payer: Self-pay | Admitting: Internal Medicine

## 2022-10-29 DIAGNOSIS — I4891 Unspecified atrial fibrillation: Secondary | ICD-10-CM | POA: Diagnosis not present

## 2022-10-29 DIAGNOSIS — N189 Chronic kidney disease, unspecified: Secondary | ICD-10-CM

## 2022-10-29 DIAGNOSIS — I5023 Acute on chronic systolic (congestive) heart failure: Secondary | ICD-10-CM | POA: Diagnosis not present

## 2022-10-29 DIAGNOSIS — I130 Hypertensive heart and chronic kidney disease with heart failure and stage 1 through stage 4 chronic kidney disease, or unspecified chronic kidney disease: Secondary | ICD-10-CM

## 2022-10-29 DIAGNOSIS — N179 Acute kidney failure, unspecified: Secondary | ICD-10-CM

## 2022-10-29 DIAGNOSIS — J189 Pneumonia, unspecified organism: Secondary | ICD-10-CM | POA: Diagnosis not present

## 2022-10-29 DIAGNOSIS — I5043 Acute on chronic combined systolic (congestive) and diastolic (congestive) heart failure: Secondary | ICD-10-CM | POA: Diagnosis not present

## 2022-10-29 DIAGNOSIS — I159 Secondary hypertension, unspecified: Secondary | ICD-10-CM

## 2022-10-29 DIAGNOSIS — I340 Nonrheumatic mitral (valve) insufficiency: Secondary | ICD-10-CM

## 2022-10-29 DIAGNOSIS — D631 Anemia in chronic kidney disease: Secondary | ICD-10-CM

## 2022-10-29 DIAGNOSIS — I509 Heart failure, unspecified: Secondary | ICD-10-CM

## 2022-10-29 HISTORY — PX: CARDIOVERSION: SHX1299

## 2022-10-29 HISTORY — PX: TEE WITHOUT CARDIOVERSION: SHX5443

## 2022-10-29 LAB — BASIC METABOLIC PANEL
Anion gap: 10 (ref 5–15)
BUN: 58 mg/dL — ABNORMAL HIGH (ref 8–23)
CO2: 17 mmol/L — ABNORMAL LOW (ref 22–32)
Calcium: 8.2 mg/dL — ABNORMAL LOW (ref 8.9–10.3)
Chloride: 109 mmol/L (ref 98–111)
Creatinine, Ser: 2.75 mg/dL — ABNORMAL HIGH (ref 0.61–1.24)
GFR, Estimated: 22 mL/min — ABNORMAL LOW (ref >60)
Glucose, Bld: 100 mg/dL — ABNORMAL HIGH (ref 70–99)
Potassium: 3.9 mmol/L (ref 3.5–5.1)
Sodium: 136 mmol/L (ref 135–145)

## 2022-10-29 LAB — CBC
HCT: 27.4 % — ABNORMAL LOW (ref 39.0–52.0)
Hemoglobin: 9 g/dL — ABNORMAL LOW (ref 13.0–17.0)
MCH: 29.2 pg (ref 26.0–34.0)
MCHC: 32.8 g/dL (ref 30.0–36.0)
MCV: 89 fL (ref 80.0–100.0)
Platelets: 268 10*3/uL (ref 150–400)
RBC: 3.08 MIL/uL — ABNORMAL LOW (ref 4.22–5.81)
RDW: 15.1 % (ref 11.5–15.5)
WBC: 6.9 10*3/uL (ref 4.0–10.5)
nRBC: 0 % (ref 0.0–0.2)

## 2022-10-29 LAB — PROTIME-INR
INR: 1.5 — ABNORMAL HIGH (ref 0.8–1.2)
Prothrombin Time: 17.9 seconds — ABNORMAL HIGH (ref 11.4–15.2)

## 2022-10-29 SURGERY — ECHOCARDIOGRAM, TRANSESOPHAGEAL
Anesthesia: Monitor Anesthesia Care

## 2022-10-29 MED ORDER — PROPOFOL 10 MG/ML IV BOLUS
INTRAVENOUS | Status: DC | PRN
  Administered 2022-10-29: 20 mg via INTRAVENOUS
  Administered 2022-10-29: 10 mg via INTRAVENOUS

## 2022-10-29 MED ORDER — PHENYLEPHRINE 80 MCG/ML (10ML) SYRINGE FOR IV PUSH (FOR BLOOD PRESSURE SUPPORT)
PREFILLED_SYRINGE | INTRAVENOUS | Status: DC | PRN
  Administered 2022-10-29 (×2): 160 ug via INTRAVENOUS
  Administered 2022-10-29: 240 ug via INTRAVENOUS
  Administered 2022-10-29: 160 ug via INTRAVENOUS
  Administered 2022-10-29: 80 ug via INTRAVENOUS

## 2022-10-29 MED ORDER — LIDOCAINE 2% (20 MG/ML) 5 ML SYRINGE
INTRAMUSCULAR | Status: DC | PRN
  Administered 2022-10-29: 60 mg via INTRAVENOUS

## 2022-10-29 MED ORDER — PROPOFOL 500 MG/50ML IV EMUL
INTRAVENOUS | Status: DC | PRN
  Administered 2022-10-29: 75 ug/kg/min via INTRAVENOUS

## 2022-10-29 MED ORDER — SODIUM CHLORIDE 0.9 % IV SOLN: INTRAVENOUS | Status: DC

## 2022-10-29 NOTE — Assessment & Plan Note (Signed)
Continue blood pressure control with carvedilol, hydralazine and isosorbide.  

## 2022-10-29 NOTE — Hospital Course (Addendum)
Douglas Matthews was admitted to the hospital with the working diagnosis of atrial fibrillation with RVR.   87 yo male with the past medical history of hypertension, dyslipidemia, chronic kidney disease stage 3b, and PBH who presented with dyspnea. Reported 7 days of dyspnea on exertion, with no edema or orthopnea. On his initial physical examination his blood pressure was 159/109, HR 133, RR 20 and 02 saturation 96%, lungs with no wheezing or rales, heart with S1 and S2 present, irregularly irregular, abdomen with no distention, no lower extremity edema.   Na 141, K 5,1 CL 114 bicarbonate 15 glucose 102 bun 57 cr 2,15  Mg 2.2  Wbc 6,3 hgb 9,4 plt 241  Sars covid 19 negative  Urinary legionella and pneumococcal antigens negative   Chest radiograph with cardiomegaly, bilateral hilar vascular congestion, right lower lobe nodular infiltrate and left pleural effusion.   EKG 70 bpm, normal axis, normal qtc, atrial fibrillation rhythm with no significant ST segment or T wave changes.   Patient was placed on diuresis and antibiotic therapy.   11/29 TEE and direct current cardioversion.  11/30 Patient with improvement in his symptoms, plan to discharge home and follow up as outpatient.

## 2022-10-29 NOTE — Progress Notes (Signed)
   Heart Failure Stewardship Pharmacist Progress Note   PCP: [place], [name], MD PCP-Cardiologist: Kristeen Miss, MD    HPI:  Douglas Matthews is a 87 y.o. male with medical history significant of hypertension, hyperlipidemia, CKD stage IIIb, Paget's disease, and BPH who presented with worsening shortness of breath x 2 days.  He was found to be in new onset atrial fibrillation/flutter with RVR with rate in the 140s.  Cardiology noted presentation not consistent with NSTEMI.  Afib was initially managed with diltiazem, however was stopped after echocardiogram 11/25 showed LVEF of 25-30% with moderately reduced RV, mild to moderate mitral regurgitation and aortic stenosis. LV noted to be dyssynchronous.  Successful DCCV today, planning discharge today.  Current HF Medications: Beta Blocker: Coreg 6.25 mg BID SGLT2i: Jardiance 10 mg QD Other: Hydralazine 10 mg every 8 hours, Imdur 30 mg QD  Prior to admission HF Medications: None  Pertinent Lab Values: Serum creatinine 2.75, BUN 58, Potassium 3.9, Sodium 136, BNP 721.7, Magnesium 2.2   Vital Signs: Weight: 144.3 lbs (admission weight: 146.8 lbs) Blood pressure: 120s/80s  Heart rate: 100s in afib I/O: slightly positive yesterday and this admission  Medication Assistance / Insurance Benefits Check: Does the patient have prescription insurance?  Yes Type of insurance plan: Medicare  Outpatient Pharmacy:  [facility] Is the patient willing to use [facility] TOC pharmacy at discharge? Yes Is the patient willing to transition their outpatient pharmacy to utilize a [facility] outpatient pharmacy?   No    Assessment: 1. New onset systolic CHF (LVEF 25-30%), due to non-ICM. NYHA class I-II symptoms. - Maintain strict I/Os, daily weights, Mg >2, and K >4. - Creatinine is elevated, but stable since starting Jardiance yesterday. Still unable to add RAASi or spironolactone. - BP is labile. No changes today to allow  for renal perfusion. - Patient is asymptomatic today, with no shortness of breath or peripheral edema. Cardiology noted JVD and CXR yesterday showed pleural effusion. Lasix ordered by cardiology.   Plan: 1) Medication changes recommended at this time: - None at this time  2) Patient assistance: - Will perform copay checks tomorrow  3)  Education  - To be completed prior to discharge  Thank you for allowing pharmacy to participate in this patient's care.  Enos Fling, PharmD PGY2 Pharmacy Resident 10/29/2022 8:31 AM Check [email or web address] for unit specific pharmacy number

## 2022-10-29 NOTE — Assessment & Plan Note (Signed)
Patient sp direct current cardioversion, successfully back on sinus rhythm.  Plan to continue rate control with carvedilol and anticoagulation with apixaban. PT with no fallow up needed.

## 2022-10-29 NOTE — Assessment & Plan Note (Addendum)
Blood culture one bottle positive for gram positive.  Patient has been on cedfdinir Will follow up on sensitivities. Hold further antibiotic therapy for now.

## 2022-10-29 NOTE — Assessment & Plan Note (Addendum)
CKD stage 3b to 4 (his base serum cr is 2 to 3)   At the time of his discharge his renal function has a serum cr of 2,95 with K at 4,1 and serum bicarbonate at 19. Plan to continue diuresis as needed with furosemide and follow up renal function as outpatient. Hold on RAS inhibition or SGLT 2 inh for now.

## 2022-10-29 NOTE — Assessment & Plan Note (Addendum)
Echocardiogram with reduced LV systolic function. EF 25 to 30%, global hypokinesis, moderate LVH, RV systolic function with moderate reduction. RVSP 31.3, moderate LA dilatation. No significant valvular disease.   Urine output is 1,250 ml Systolic blood pressure 100 to 120 mmHg,   Acute hypoxemic respiratory failure due to cardiogenic pulmonary edema.  Plan to continue with carvedilol, empagliflozin and isosrobide/ hydralazine. Holding on RAS inhibition until renal function more stable.

## 2022-10-29 NOTE — Transfer of Care (Signed)
Immediate Anesthesia Transfer of Care Note  Patient: Douglas Matthews  Procedure(s) Performed: TRANSESOPHAGEAL ECHOCARDIOGRAM (TEE) CARDIOVERSION  Patient Location: Endoscopy Unit  Anesthesia Type:MAC  Level of Consciousness: drowsy  Airway & Oxygen Therapy: Patient Spontanous Breathing  Post-op Assessment: Report given to RN and Post -op Vital signs reviewed and stable  Post vital signs: Reviewed and stable  Last Vitals:  Vitals Value Taken Time  BP    Temp    Pulse    Resp    SpO2      Last Pain:  Vitals:   10/29/22 1225  TempSrc: Temporal  PainSc: 0-No pain      Patients Stated Pain Goal: 0 (35/59/74 1638)  Complications: No notable events documented.

## 2022-10-29 NOTE — Progress Notes (Signed)
     Transesophageal Echocardiogram Note  Douglas Matthews [id number] 14-Dec-1934  Procedure: Transesophageal Echocardiogram Indications: Atrial fibrillation  Procedure Details Consent: Obtained Time Out: Verified patient identification, verified procedure, site/side was marked, verified correct patient position, special equipment/implants available, Radiology Safety Procedures followed,  medications/allergies/relevent history reviewed, required imaging and test results available.  Performed  Medications:  Pt sedated by anesthesia with diprovan 88 mg IV total.  Severe global reduction in LV function (EF 30-35); moderate biatrial enlargement; no LAA thrombus; mild RVE; small pericardial effusion; mild MR and TR; trace AI.  Pt subsequently underwent synchronized DCCV with 200J to sinus rhythm. Continue apixaban.   Complications: No apparent complications Patient did tolerate procedure well.  Olga Millers, MD

## 2022-10-29 NOTE — Interval H&P Note (Signed)
History and Physical Interval Note:  10/29/2022 11:57 AM  Douglas Matthews  has presented today for surgery, with the diagnosis of a fib.  The various methods of treatment have been discussed with the patient and family. After consideration of risks, benefits and other options for treatment, the patient has consented to  Procedure(s): TRANSESOPHAGEAL ECHOCARDIOGRAM (TEE) (N/A) CARDIOVERSION (N/A) as a surgical intervention.  The patient's history has been reviewed, patient examined, no change in status, stable for surgery.  I have reviewed the patient's chart and labs.  Questions were answered to the patient's satisfaction.     Olga Millers

## 2022-10-29 NOTE — Progress Notes (Signed)
Mobility Specialist - Progress Note   10/29/22 1059  Mobility  Activity Ambulated with assistance in hallway  Level of Assistance Standby assist, set-up cues, supervision of patient - no hands on  Assistive Device None  Distance Ambulated (ft) 400 ft  Activity Response Tolerated well  Mobility Referral Yes  $Mobility charge 1 Mobility    Pre-mobility: 98 HR During mobility:157 HR Post-mobility:96  HR  Pt received in bed and agreeable to mobility. No complaints throughout ambulation. Pt was returned to bed with all needs met.   Franki Monte  Mobility Specialist Please contact via Solicitor or Rehab office at [phone]

## 2022-10-29 NOTE — Progress Notes (Signed)
PHARMACY - PHYSICIAN COMMUNICATION CRITICAL VALUE ALERT - BLOOD CULTURE IDENTIFICATION (BCID)  Douglas Matthews is an 87 y.o. male who presented to [facility] on 10/24/2022 with a chief complaint of shortness of breath.  Assessment:  Patient presented with shortness of breath, received a short course of antibiotics now with GPR in 1/2 blood cultures.   Name of physician (or Provider) Contacted: Arrien MD  Current antibiotics: Off abx as of 11/28.  Changes to prescribed antibiotics recommended:  Physician is going to review patient and consider changes as appropriate.   Sheppard Coil PharmD., BCPS Clinical Pharmacist 10/29/2022 1:33 PM

## 2022-10-29 NOTE — Anesthesia Preprocedure Evaluation (Addendum)
Anesthesia Evaluation  Patient identified by MRN, date of birth, ID band Patient awake    Reviewed: Allergy & Precautions, NPO status , Patient's Chart, lab work & pertinent test results  History of Anesthesia Complications Negative for: history of anesthetic complications  Airway Mallampati: III  TM Distance: >3 FB Neck ROM: Full    Dental  (+) Edentulous Upper, Edentulous Lower   Pulmonary neg pulmonary ROS   Pulmonary exam normal breath sounds clear to auscultation       Cardiovascular hypertension, Pt. on home beta blockers (-) angina +CHF (EF 25-30%, moderately reduced RV systolic function)  (-) Past MI, (-) Cardiac Stents and (-) CABG + Valvular Problems/Murmurs (mild-moderate) MR and AS  Rhythm:Irregular Rate:Normal  HLD  TTE 10/25/2022: 1. There is substantial septal-lateral left ventricular dyssynchrony.  Left ventricular ejection fraction, by estimation, is 25 to 30%. The left  ventricle has severely decreased function. The left ventricle demonstrates  global hypokinesis. There is  moderate concentric left ventricular hypertrophy. Left ventricular  diastolic function could not be evaluated.   2. Right ventricular systolic function is moderately reduced. The right  ventricular size is normal. There is normal pulmonary artery systolic  pressure. The estimated right ventricular systolic pressure is 34.2 mmHg.   3. Left atrial size was mild to moderately dilated.   4. Right atrial size was mildly dilated.   5. The mitral valve is normal in structure. Mild to moderate mitral valve  regurgitation.   6. The aortic valve has an indeterminate number of cusps. There is  moderate calcification of the aortic valve. There is moderate thickening  of the aortic valve. Mild to moderate aortic valve stenosis. Gradients are  low due to poor LV function.   7. The inferior vena cava is dilated in size with <50% respiratory   variability, suggesting right atrial pressure of 15 mmHg.     Neuro/Psych negative neurological ROS     GI/Hepatic Neg liver ROS,neg GERD  ,,diverticulosis   Endo/Other  negative endocrine ROS    Renal/GU CRFRenal disease     Musculoskeletal   Abdominal  (+) - obese  Peds  Hematology  (+) Blood dyscrasia, anemia   Anesthesia Other Findings   Reproductive/Obstetrics                             Anesthesia Physical Anesthesia Plan  ASA: 4  Anesthesia Plan: MAC   Post-op Pain Management:    Induction: Intravenous  PONV Risk Score and Plan: 1  Airway Management Planned: Nasal Cannula and Natural Airway  Additional Equipment:   Intra-op Plan:   Post-operative Plan:   Informed Consent: I have reviewed the patients History and Physical, chart, labs and discussed the procedure including the risks, benefits and alternatives for the proposed anesthesia with the patient or authorized representative who has indicated his/her understanding and acceptance.     Dental advisory given  Plan Discussed with: CRNA and Anesthesiologist  Anesthesia Plan Comments: (Discussed with patient risks of MAC including, but not limited to, minor pain or discomfort, hearing people in the room, and possible need for backup general anesthesia. Risks for general anesthesia also discussed including, but not limited to, sore throat, hoarse voice, chipped/damaged teeth, injury to vocal cords, nausea and vomiting, allergic reactions, lung infection, heart attack, stroke, and death. All questions answered. )       Anesthesia Quick Evaluation

## 2022-10-29 NOTE — Anesthesia Postprocedure Evaluation (Signed)
Anesthesia Post Note  Patient: Douglas Matthews  Procedure(s) Performed: TRANSESOPHAGEAL ECHOCARDIOGRAM (TEE) CARDIOVERSION     Patient location during evaluation: PACU Anesthesia Type: MAC Level of consciousness: awake Pain management: pain level controlled Vital Signs Assessment: post-procedure vital signs reviewed and stable Respiratory status: spontaneous breathing, nonlabored ventilation and respiratory function stable Cardiovascular status: stable and blood pressure returned to baseline Postop Assessment: no apparent nausea or vomiting Anesthetic complications: no   No notable events documented.  Last Vitals:  Vitals:   10/29/22 1320 10/29/22 1330  BP: 96/62 103/69  Pulse: 81 78  Resp: 15 11  Temp: (!) 36.3 C   SpO2: 97% 97%    Last Pain:  Vitals:   10/29/22 1330  TempSrc:   PainSc: 0-No pain                 Nilda Simmer

## 2022-10-29 NOTE — Progress Notes (Signed)
Progress Note   Patient: Douglas Matthews DGU:[id number] DOB: 08/02/1935 DOA: 10/24/2022     4 DOS: the patient was seen and examined on 10/29/2022   Brief hospital course: Mr. Rijo was admitted to the hospital with the working diagnosis of atrial fibrillation with RVR.   87 yo male with the past medical history of hypertension, dyslipidemia, chronic kidney disease stage 3b, and PBH who presented with dyspnea. Reported 7 days of dyspnea on exertion, with no edema or orthopnea. On his initial physical examination his blood pressure was 159/109, HR 133, RR 20 and 02 saturation 96%, lungs with no wheezing or rales, heart with S1 and S2 present, irregularly irregular, abdomen with no distention, no lower extremity edema.   Na 141, K 5,1 CL 114 bicarbonate 15 glucose 102 bun 57 cr 2,15  Mg 2.2  Wbc 6,3 hgb 9,4 plt 241  Sars covid 19 negative  Urinary legionella and pneumococcal antigens negative   Chest radiograph with cardiomegaly, bilateral hilar vascular congestion, right lower lobe nodular infiltrate and left pleural effusion.   EKG 70 bpm, normal axis, normal qtc, atrial fibrillation rhythm with no significant ST segment or T wave changes.   Patient was placed on diuresis and antibiotic therapy.   11/29 TEE and direct current cardioversion.   Assessment and Plan: * New onset atrial fibrillation (HCC) Patient sp direct current cardioversion, successfully back on sinus rhythm.  Plan to continue rate control with carvedilol and anticoagulation with apixaban. Out of bed to chair tid with meals, Pt and Ot.  Continue telemetry monitoring   Acute on chronic systolic CHF (congestive heart failure) (HCC) Echocardiogram with reduced LV systolic function. EF 25 to 30%, global hypokinesis, moderate LVH, RV systolic function with moderate reduction. RVSP 31.3, moderate LA dilatation. No significant valvular disease.   Urine output is 1,250 ml Systolic blood pressure 100 to 120 mmHg,    Acute hypoxemic respiratory failure due to cardiogenic pulmonary edema.  Plan to continue with carvedilol, empagliflozin and isosrobide/ hydralazine. Holding on RAS inhibition until renal function more stable.   CAP (community acquired pneumonia) Blood culture one bottle positive for gram positive.  Patient has been on cedfdinir Will follow up on sensitivities. Hold further antibiotic therapy for now.   Acute kidney injury superimposed on chronic kidney disease (HCC) CKD stage 3b  Renal function with serum cr at 2,75 with K at 3,9 and serum bicarbonate at 17. Plan to continue close follow up renal function, continue holding on loop diuretic for now.   HTN (hypertension) Continue blood pressure control with carvedilol, hydralazine and isosorbide.        Subjective: Patient with no chest pain, dyspnea has improved,   Physical Exam: Vitals:   10/29/22 1330 10/29/22 1340 10/29/22 1350 10/29/22 1536  BP: 103/69 113/69 113/76 121/83  Pulse: 78 85 80   Resp: 11 15 (!) 9   Temp:      TempSrc:      SpO2: 97% 98% 99%   Weight:      Height:       Neurology awake and alert ENT with mild pallor Cardiovascular with S1 and S2 present and rhythmic with no gallops, rubs or murmurs Respiratory with no rales or wheezing Abdomen with no distention  No lower extremity edema  Data Reviewed:    Family Communication: I spoke with patient's wife at the bedside, we talked in detail about patient's condition, plan of care and prognosis and all questions were addressed.   Disposition: Status is:  Inpatient Remains inpatient appropriate because: telemetry monitoring post cardioversion   Planned Discharge Destination: Home     Author: Tawni Millers, MD 10/29/2022 4:02 PM  For on call review [email or web address].

## 2022-10-29 NOTE — H&P (View-Only) (Signed)
Rounding Note    Patient Name: Douglas Matthews Date of Encounter: 10/29/2022  [place] Cardiologist: Mertie Moores, MD   Subjective   Patient seen on AM rounds. Denies any chest pain or shortness of breath. Remains in atrial fibrillation on telemetry that has been rate controlled. -770 mls output in the last 24 hours. Serum creatine with slight increase this morning to 2.75 from 2.46.  Inpatient Medications    Scheduled Meds:  apixaban  2.5 mg Oral BID   carvedilol  3.125 mg Oral Once   carvedilol  6.25 mg Oral BID WC   empagliflozin  10 mg Oral Daily   furosemide  40 mg Intravenous BID   hydrALAZINE  10 mg Oral Q8H   isosorbide mononitrate  30 mg Oral Daily   Continuous Infusions:  PRN Meds: acetaminophen **OR** acetaminophen   Vital Signs    Vitals:   10/28/22 1226 10/28/22 2005 10/29/22 0433 10/29/22 0549  BP: 121/79 108/78 (!) 122/97 122/89  Pulse: (!) 111 83 100   Resp: 19 18 18    Temp: (!) 97.3 F (36.3 C) 99.2 F (37.3 C) 99.7 F (37.6 C)   TempSrc: Oral Axillary Oral   SpO2: 92% 96% 97%   Weight:      Height:        Intake/Output Summary (Last 24 hours) at 10/29/2022 0832 Last data filed at 10/29/2022 0100 Gross per 24 hour  Intake 480 ml  Output 1250 ml  Net -770 ml      10/27/2022    4:31 AM 10/25/2022    1:03 AM 10/24/2022    3:40 PM  Last 3 Weights  Weight (lbs) 144 lb 4.8 oz 146 lb 12.8 oz 150 lb  Weight (kg) 65.454 kg 66.588 kg 68.04 kg      Telemetry    Rate controlled atrial fibrillation - Personally Reviewed  ECG    No new tracings - Personally Reviewed  Physical Exam   GEN: No acute distress.   Neck: JVD noted Cardiac: irregularly irregular, no murmurs, rubs, or gallops.  Respiratory: Clear upper lobes with diminished bases to auscultation bilaterally. Respirations are unlabored on room air at rest GI: Soft, nontender, non-distended  MS: No edema; No deformity. Neuro:  Nonfocal  Psych: Normal affect    Labs    High Sensitivity Troponin:   Recent Labs  Lab 10/24/22 1546 10/24/22 1830  TROPONINIHS 61* 57*     Chemistry Recent Labs  Lab 10/25/22 0615 10/27/22 0219 10/28/22 1156 10/29/22 0326  NA 141 141 140 136  K 5.1 4.5 4.2 3.9  CL 114* 111 111 109  CO2 15* 17* 18* 17*  GLUCOSE 102* 108* 103* 100*  BUN 57* 65* 52* 58*  CREATININE 2.15* 2.43* 2.46* 2.75*  CALCIUM 9.1 8.8* 8.9 8.2*  MG 2.2  --   --   --   GFRNONAA 30* 25* 25* 22*  ANIONGAP 12 13 11 10     Lipids No results for input(s): "CHOL", "TRIG", "HDL", "LABVLDL", "LDLCALC", "CHOLHDL" in the last 168 hours.  Hematology Recent Labs  Lab 10/27/22 0219 10/28/22 0244 10/29/22 0326  WBC 7.2 7.1 6.9  RBC 2.81* 2.94* 3.08*  HGB 8.2* 8.6* 9.0*  HCT 24.6* 26.3* 27.4*  MCV 87.5 89.5 89.0  MCH 29.2 29.3 29.2  MCHC 33.3 32.7 32.8  RDW 15.5 15.5 15.1  PLT 212 245 268   Thyroid  Recent Labs  Lab 10/25/22 0615  TSH 4.260    BNP Recent Labs  Lab 10/24/22 1603  BNP 721.7*    DDimer  Recent Labs  Lab 10/24/22 1546  DDIMER 6.07*     Radiology    NM Pulmonary Perfusion  Result Date: 10/27/2022 CLINICAL DATA:  Elevated D-dimer. Chronic short of breath. Congestive heart failure. EXAM: NUCLEAR MEDICINE PERFUSION LUNG SCAN TECHNIQUE: Perfusion images were obtained in multiple projections after intravenous injection of radiopharmaceutical. RADIOPHARMACEUTICALS:  4.0 mCi Tc-69m MAA COMPARISON:  Radiograph 10/27/2022 FINDINGS: No wedge-shaped peripheral perfusion defect within LEFT or RIGHT lung to suggest acute pulmonary embolism. IMPRESSION: No evidence acute pulmonary embolism. Electronically Signed   By: Suzy Bouchard M.D.   On: 10/27/2022 15:50   DG Chest 2 View  Result Date: 10/27/2022 CLINICAL DATA:  Atrial fibrillation EXAM: CHEST - 2 VIEW COMPARISON:  10/24/2022 FINDINGS: Normal cardiac silhouette. There are bilateral pleural effusions increased from comparison exam. Effusions are moderate layering.  Upper lungs clear. No pneumothorax. IMPRESSION: Increasing bilateral pleural effusions. Electronically Signed   By: Suzy Bouchard M.D.   On: 10/27/2022 14:19    Cardiac Studies  TTE 10/25/2022  1. There is substantial septal-lateral left ventricular dyssynchrony.  Left ventricular ejection fraction, by estimation, is 25 to 30%. The left  ventricle has severely decreased function. The left ventricle demonstrates global hypokinesis. There is moderate concentric left ventricular hypertrophy. Left ventricular diastolic function could not be evaluated.   2. Right ventricular systolic function is moderately reduced. The right  ventricular size is normal. There is normal pulmonary artery systolic  pressure. The estimated right ventricular systolic pressure is 123456 mmHg.   3. Left atrial size was mild to moderately dilated.   4. Right atrial size was mildly dilated.   5. The mitral valve is normal in structure. Mild to moderate mitral valve regurgitation.   6. The aortic valve has an indeterminate number of cusps. There is  moderate calcification of the aortic valve. There is moderate thickening  of the aortic valve. Mild to moderate aortic valve stenosis. Gradients are low due to poor LV function.   7. The inferior vena cava is dilated in size with <50% respiratory  variability, suggesting right atrial pressure of 15 mmHg.   Patient Profile     87 y.o. male with a history of hypertension, hyperlipidemia, BPH who is being seen and evaluated for new onset atrial fibrillation/atrial flutter RVR secondary to PNA, hypoxia, CHF, and new LBBB.  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 -denies shortness of breath at rest on room air -euvolemic on exam - -770 mls output in the last 24 hours -continued on coreg 6.25 mg bid -continued on jardiance 10 mg daily -unable to escalate GDMT due to elevated creatinine -daily weight, I&O, low sodium diet   Hypertension -blood pressure 122/89 -continued on coreg, hydralazine,imdur, and furosemide -vitals per unit protocol  Chronic kidney disease stage IIIb -serum creatine increasing 2.75 -baseline serum creatine 2.15-2.4 -daily bmp -monitor I&O -monitor/trend/replete electrolytes as needed -avoid nephrotoxic agents where able    CHA2DS2-VASc Score = 4   This indicates  a 4.8% annual risk of stroke. The patient's score is based upon: CHF History: 1 HTN History: 1 Diabetes History: 0 Stroke History: 0 Vascular Disease History: 0 Age Score: 2 Gender Score: 0      For questions or updates, please contact [facility] Please consult [email or web address] for contact info under        Signed, SHERI HAMMOCK, NP  10/29/2022, 8:32 AM    Attending Note:   The patient was seen and examined.  Agree with assessment and plan as noted above.  Changes made to the above note as needed.  Patient seen and independently examined with Frutoso Schatz, NP .   We discussed all aspects of the encounter. I agree with the assessment and plan as stated above.     Atrial fib:  still has RVR with ambulation .   He has been on Eliquis for several days.  Will schedule him for TEE / CV today  2.  CHF:   cont meds.  We are limited by his CKD .   He may improve with cardioversion  3.  CKD:   further plans per Dr. Ella Jubilee   4.  HTN:   BP is well controlled     I have spent a total of 40 minutes with patient reviewing hospital   notes , telemetry, EKGs, labs and examining patient as well as establishing an assessment and plan that was discussed with the patient.  > 50% of time was spent in direct patient care.    Vesta Mixer, Montez Hageman., MD, [facility] 10/29/2022, 9:00 AM [address],  [address] Office [phone] Pager [phone]

## 2022-10-29 NOTE — Progress Notes (Addendum)
Rounding Note    Patient Name: Douglas Matthews Date of Encounter: 10/29/2022  [place] Cardiologist: Mertie Moores, MD   Subjective   Patient seen on AM rounds. Denies any chest pain or shortness of breath. Remains in atrial fibrillation on telemetry that has been rate controlled. -770 mls output in the last 24 hours. Serum creatine with slight increase this morning to 2.75 from 2.46.  Inpatient Medications    Scheduled Meds:  apixaban  2.5 mg Oral BID   carvedilol  3.125 mg Oral Once   carvedilol  6.25 mg Oral BID WC   empagliflozin  10 mg Oral Daily   furosemide  40 mg Intravenous BID   hydrALAZINE  10 mg Oral Q8H   isosorbide mononitrate  30 mg Oral Daily   Continuous Infusions:  PRN Meds: acetaminophen **OR** acetaminophen   Vital Signs    Vitals:   10/28/22 1226 10/28/22 2005 10/29/22 0433 10/29/22 0549  BP: 121/79 108/78 (!) 122/97 122/89  Pulse: (!) 111 83 100   Resp: 19 18 18    Temp: (!) 97.3 F (36.3 C) 99.2 F (37.3 C) 99.7 F (37.6 C)   TempSrc: Oral Axillary Oral   SpO2: 92% 96% 97%   Weight:      Height:        Intake/Output Summary (Last 24 hours) at 10/29/2022 0832 Last data filed at 10/29/2022 0100 Gross per 24 hour  Intake 480 ml  Output 1250 ml  Net -770 ml      10/27/2022    4:31 AM 10/25/2022    1:03 AM 10/24/2022    3:40 PM  Last 3 Weights  Weight (lbs) 144 lb 4.8 oz 146 lb 12.8 oz 150 lb  Weight (kg) 65.454 kg 66.588 kg 68.04 kg      Telemetry    Rate controlled atrial fibrillation - Personally Reviewed  ECG    No new tracings - Personally Reviewed  Physical Exam   GEN: No acute distress.   Neck: JVD noted Cardiac: irregularly irregular, no murmurs, rubs, or gallops.  Respiratory: Clear upper lobes with diminished bases to auscultation bilaterally. Respirations are unlabored on room air at rest GI: Soft, nontender, non-distended  MS: No edema; No deformity. Neuro:  Nonfocal  Psych: Normal affect    Labs    High Sensitivity Troponin:   Recent Labs  Lab 10/24/22 1546 10/24/22 1830  TROPONINIHS 61* 57*     Chemistry Recent Labs  Lab 10/25/22 0615 10/27/22 0219 10/28/22 1156 10/29/22 0326  NA 141 141 140 136  K 5.1 4.5 4.2 3.9  CL 114* 111 111 109  CO2 15* 17* 18* 17*  GLUCOSE 102* 108* 103* 100*  BUN 57* 65* 52* 58*  CREATININE 2.15* 2.43* 2.46* 2.75*  CALCIUM 9.1 8.8* 8.9 8.2*  MG 2.2  --   --   --   GFRNONAA 30* 25* 25* 22*  ANIONGAP 12 13 11 10     Lipids No results for input(s): "CHOL", "TRIG", "HDL", "LABVLDL", "LDLCALC", "CHOLHDL" in the last 168 hours.  Hematology Recent Labs  Lab 10/27/22 0219 10/28/22 0244 10/29/22 0326  WBC 7.2 7.1 6.9  RBC 2.81* 2.94* 3.08*  HGB 8.2* 8.6* 9.0*  HCT 24.6* 26.3* 27.4*  MCV 87.5 89.5 89.0  MCH 29.2 29.3 29.2  MCHC 33.3 32.7 32.8  RDW 15.5 15.5 15.1  PLT 212 245 268   Thyroid  Recent Labs  Lab 10/25/22 0615  TSH 4.260    BNP Recent Labs  Lab 10/24/22 1603  BNP 721.7*    DDimer  Recent Labs  Lab 10/24/22 1546  DDIMER 6.07*     Radiology    NM Pulmonary Perfusion  Result Date: 10/27/2022 CLINICAL DATA:  Elevated D-dimer. Chronic short of breath. Congestive heart failure. EXAM: NUCLEAR MEDICINE PERFUSION LUNG SCAN TECHNIQUE: Perfusion images were obtained in multiple projections after intravenous injection of radiopharmaceutical. RADIOPHARMACEUTICALS:  4.0 mCi Tc-36m MAA COMPARISON:  Radiograph 10/27/2022 FINDINGS: No wedge-shaped peripheral perfusion defect within LEFT or RIGHT lung to suggest acute pulmonary embolism. IMPRESSION: No evidence acute pulmonary embolism. Electronically Signed   By: Suzy Bouchard M.D.   On: 10/27/2022 15:50   DG Chest 2 View  Result Date: 10/27/2022 CLINICAL DATA:  Atrial fibrillation EXAM: CHEST - 2 VIEW COMPARISON:  10/24/2022 FINDINGS: Normal cardiac silhouette. There are bilateral pleural effusions increased from comparison exam. Effusions are moderate layering.  Upper lungs clear. No pneumothorax. IMPRESSION: Increasing bilateral pleural effusions. Electronically Signed   By: Suzy Bouchard M.D.   On: 10/27/2022 14:19    Cardiac Studies  TTE 10/25/2022  1. There is substantial septal-lateral left ventricular dyssynchrony.  Left ventricular ejection fraction, by estimation, is 25 to 30%. The left  ventricle has severely decreased function. The left ventricle demonstrates global hypokinesis. There is moderate concentric left ventricular hypertrophy. Left ventricular diastolic function could not be evaluated.   2. Right ventricular systolic function is moderately reduced. The right  ventricular size is normal. There is normal pulmonary artery systolic  pressure. The estimated right ventricular systolic pressure is 123456 mmHg.   3. Left atrial size was mild to moderately dilated.   4. Right atrial size was mildly dilated.   5. The mitral valve is normal in structure. Mild to moderate mitral valve regurgitation.   6. The aortic valve has an indeterminate number of cusps. There is  moderate calcification of the aortic valve. There is moderate thickening  of the aortic valve. Mild to moderate aortic valve stenosis. Gradients are low due to poor LV function.   7. The inferior vena cava is dilated in size with <50% respiratory  variability, suggesting right atrial pressure of 15 mmHg.   Patient Profile     87 y.o. male with a history of hypertension, hyperlipidemia, BPH who is being seen and evaluated for new onset atrial fibrillation/atrial flutter RVR secondary to PNA, hypoxia, CHF, and new LBBB.  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 -denies shortness of breath at rest on room air -euvolemic on exam - -770 mls output in the last 24 hours -continued on coreg 6.25 mg bid -continued on jardiance 10 mg daily -unable to escalate GDMT due to elevated creatinine -daily weight, I&O, low sodium diet   Hypertension -blood pressure 122/89 -continued on coreg, hydralazine,imdur, and furosemide -vitals per unit protocol  Chronic kidney disease stage IIIb -serum creatine increasing 2.75 -baseline serum creatine 2.15-2.4 -daily bmp -monitor I&O -monitor/trend/replete electrolytes as needed -avoid nephrotoxic agents where able    CHA2DS2-VASc Score = 4   This indicates  a 4.8% annual risk of stroke. The patient's score is based upon: CHF History: 1 HTN History: 1 Diabetes History: 0 Stroke History: 0 Vascular Disease History: 0 Age Score: 2 Gender Score: 0      For questions or updates, please contact [facility] Please consult [email or web address] for contact info under        Signed, SHERI HAMMOCK, NP  10/29/2022, 8:32 AM    Attending Note:   The patient was seen and examined.  Agree with assessment and plan as noted above.  Changes made to the above note as needed.  Patient seen and independently examined with Frutoso Schatz, NP .   We discussed all aspects of the encounter. I agree with the assessment and plan as stated above.     Atrial fib:  still has RVR with ambulation .   He has been on Eliquis for several days.  Will schedule him for TEE / CV today  2.  CHF:   cont meds.  We are limited by his CKD .   He may improve with cardioversion  3.  CKD:   further plans per Dr. Ella Jubilee   4.  HTN:   BP is well controlled     I have spent a total of 40 minutes with patient reviewing hospital   notes , telemetry, EKGs, labs and examining patient as well as establishing an assessment and plan that was discussed with the patient.  > 50% of time was spent in direct patient care.    Vesta Mixer, Montez Hageman., MD, [facility] 10/29/2022, 9:00 AM [address],  [address] Office [phone] Pager [phone]

## 2022-10-29 NOTE — Progress Notes (Signed)
  Echocardiogram 2D Echocardiogram has been performed.  Gerda Diss 10/29/2022, 1:39 PM

## 2022-10-29 NOTE — Anesthesia Procedure Notes (Signed)
Procedure Name: MAC Date/Time: 10/29/2022 12:56 PM  Performed by: Dorann Lodge, CRNAPre-anesthesia Checklist: Patient identified, Emergency Drugs available, Suction available and Patient being monitored Patient Re-evaluated:Patient Re-evaluated prior to induction Oxygen Delivery Method: Nasal cannula Airway Equipment and Method: Bite block Dental Injury: Teeth and Oropharynx as per pre-operative assessment

## 2022-10-30 ENCOUNTER — Other Ambulatory Visit: Payer: Self-pay

## 2022-10-30 ENCOUNTER — Other Ambulatory Visit (HOSPITAL_COMMUNITY): Payer: Self-pay

## 2022-10-30 DIAGNOSIS — I4891 Unspecified atrial fibrillation: Secondary | ICD-10-CM | POA: Diagnosis not present

## 2022-10-30 DIAGNOSIS — I5023 Acute on chronic systolic (congestive) heart failure: Secondary | ICD-10-CM | POA: Diagnosis not present

## 2022-10-30 DIAGNOSIS — I4819 Other persistent atrial fibrillation: Secondary | ICD-10-CM | POA: Diagnosis not present

## 2022-10-30 DIAGNOSIS — J189 Pneumonia, unspecified organism: Secondary | ICD-10-CM | POA: Diagnosis not present

## 2022-10-30 DIAGNOSIS — I5043 Acute on chronic combined systolic (congestive) and diastolic (congestive) heart failure: Secondary | ICD-10-CM | POA: Diagnosis not present

## 2022-10-30 DIAGNOSIS — I159 Secondary hypertension, unspecified: Secondary | ICD-10-CM | POA: Diagnosis not present

## 2022-10-30 LAB — CULTURE, BLOOD (ROUTINE X 2)
Culture: NO GROWTH
Special Requests: ADEQUATE

## 2022-10-30 LAB — BASIC METABOLIC PANEL
Anion gap: 12 (ref 5–15)
BUN: 62 mg/dL — ABNORMAL HIGH (ref 8–23)
CO2: 19 mmol/L — ABNORMAL LOW (ref 22–32)
Calcium: 8.6 mg/dL — ABNORMAL LOW (ref 8.9–10.3)
Chloride: 110 mmol/L (ref 98–111)
Creatinine, Ser: 2.95 mg/dL — ABNORMAL HIGH (ref 0.61–1.24)
GFR, Estimated: 20 mL/min — ABNORMAL LOW (ref >60)
Glucose, Bld: 93 mg/dL (ref 70–99)
Potassium: 4.1 mmol/L (ref 3.5–5.1)
Sodium: 141 mmol/L (ref 135–145)

## 2022-10-30 MED ORDER — APIXABAN 2.5 MG PO TABS
2.5000 mg | ORAL_TABLET | Freq: Two times a day (BID) | ORAL | 0 refills | Status: DC
  Filled 2022-10-30: qty 60, 30d supply, fill #0

## 2022-10-30 MED ORDER — FUROSEMIDE 40 MG PO TABS: 40.0000 mg | ORAL_TABLET | Freq: Every day | ORAL | Status: DC | PRN

## 2022-10-30 MED ORDER — CARVEDILOL 12.5 MG PO TABS: 12.5000 mg | ORAL_TABLET | Freq: Two times a day (BID) | ORAL | Status: DC

## 2022-10-30 MED ORDER — CARVEDILOL 12.5 MG PO TABS
12.5000 mg | ORAL_TABLET | Freq: Two times a day (BID) | ORAL | 0 refills | Status: DC
  Filled 2022-10-30: qty 60, 30d supply, fill #0

## 2022-10-30 MED ORDER — ISOSORBIDE MONONITRATE ER 30 MG PO TB24
30.0000 mg | ORAL_TABLET | Freq: Every day | ORAL | 0 refills | Status: DC
  Filled 2022-10-30: qty 30, 30d supply, fill #0

## 2022-10-30 MED ORDER — HYDRALAZINE HCL 10 MG PO TABS
10.0000 mg | ORAL_TABLET | Freq: Three times a day (TID) | ORAL | 0 refills | Status: DC
  Filled 2022-10-30: qty 90, 30d supply, fill #0

## 2022-10-30 MED ORDER — CARVEDILOL 12.5 MG PO TABS
12.5000 mg | ORAL_TABLET | Freq: Two times a day (BID) | ORAL | Status: DC
  Administered 2022-10-30: 12.5 mg via ORAL
  Filled 2022-10-30: qty 1

## 2022-10-30 MED ORDER — FUROSEMIDE 40 MG PO TABS
40.0000 mg | ORAL_TABLET | Freq: Every day | ORAL | 0 refills | Status: DC | PRN
  Filled 2022-10-30: qty 30, 30d supply, fill #0

## 2022-10-30 NOTE — Progress Notes (Addendum)
Rounding Note    Patient Name: Douglas Matthews Date of Encounter: 10/30/2022  [place] Cardiologist: Mertie Moores, MD   Subjective   Patient seen on AM rounds. Denies any chest pain or shortness of breath. Stating that he is ready to go home. Underwent successful TEE/DCCV 10/29/2022. Remains in sinus this morning. Serum creatinine continues to increase, 2.95 this morning.   He has a long hx of CKD Has had creatinine levels ranging from 2.5 - 3.0 over the past several years   Has ambulated without any issues. Has maintained NSR   Inpatient Medications    Scheduled Meds:  apixaban  2.5 mg Oral BID   carvedilol  3.125 mg Oral Once   carvedilol  6.25 mg Oral BID WC   empagliflozin  10 mg Oral Daily   hydrALAZINE  10 mg Oral Q8H   isosorbide mononitrate  30 mg Oral Daily   Continuous Infusions:  PRN Meds: acetaminophen **OR** acetaminophen   Vital Signs    Vitals:   10/29/22 1817 10/29/22 1940 10/29/22 2212 10/30/22 0406  BP: 122/74 104/68 112/79 121/76  Pulse: 100 99  (!) 101  Resp:  20    Temp:  97.6 F (36.4 C)  99 F (37.2 C)  TempSrc:  Oral  Oral  SpO2:  98%  99%  Weight:      Height:        Intake/Output Summary (Last 24 hours) at 10/30/2022 0732 Last data filed at 10/29/2022 2200 Gross per 24 hour  Intake 560 ml  Output --  Net 560 ml      10/27/2022    4:31 AM 10/25/2022    1:03 AM 10/24/2022    3:40 PM  Last 3 Weights  Weight (lbs) 144 lb 4.8 oz 146 lb 12.8 oz 150 lb  Weight (kg) 65.454 kg 66.588 kg 68.04 kg      Telemetry    Sinus to sinus tach with PAC's rates 80-110 - Personally Reviewed  ECG    No new tracings - Personally Reviewed  Physical Exam   GEN: No acute distress.   Neck: No JVD Cardiac: RRR, no murmurs, rubs, or gallops.  Respiratory: Clear to upper lobes with diminished bases to auscultation bilaterally.  Fines rales especially in RLL.   Respirations remain unlabored on room air. GI: Soft, nontender,  non-distended  MS: No edema; No deformity. Neuro:  Nonfocal  Psych: Normal affect   Labs    High Sensitivity Troponin:   Recent Labs  Lab 10/24/22 1546 10/24/22 1830  TROPONINIHS 61* 57*     Chemistry Recent Labs  Lab 10/25/22 0615 10/27/22 0219 10/28/22 1156 10/29/22 0326 10/30/22 0358  NA 141   < > 140 136 141  K 5.1   < > 4.2 3.9 4.1  CL 114*   < > 111 109 110  CO2 15*   < > 18* 17* 19*  GLUCOSE 102*   < > 103* 100* 93  BUN 57*   < > 52* 58* 62*  CREATININE 2.15*   < > 2.46* 2.75* 2.95*  CALCIUM 9.1   < > 8.9 8.2* 8.6*  MG 2.2  --   --   --   --   GFRNONAA 30*   < > 25* 22* 20*  ANIONGAP 12   < > 11 10 12    < > = values in this interval not displayed.    Lipids No results for input(s): "CHOL", "TRIG", "HDL", "LABVLDL", "LDLCALC", "CHOLHDL" in  the last 168 hours.  Hematology Recent Labs  Lab 10/27/22 0219 10/28/22 0244 10/29/22 0326  WBC 7.2 7.1 6.9  RBC 2.81* 2.94* 3.08*  HGB 8.2* 8.6* 9.0*  HCT 24.6* 26.3* 27.4*  MCV 87.5 89.5 89.0  MCH 29.2 29.3 29.2  MCHC 33.3 32.7 32.8  RDW 15.5 15.5 15.1  PLT 212 245 268   Thyroid  Recent Labs  Lab 10/25/22 0615  TSH 4.260    BNP Recent Labs  Lab 10/24/22 1603  BNP 721.7*    DDimer  Recent Labs  Lab 10/24/22 1546  DDIMER 6.07*     Radiology      Cardiac Studies  TEE/DCCV 10/30/2022 1. Pt subsequently underwent DCCV with 200J to sinus rhythm.   2. Left ventricular ejection fraction, by estimation, is 30 to 35%. The  left ventricle has moderately decreased function. The left ventricle  demonstrates global hypokinesis.   3. Right ventricular systolic function is normal. The right ventricular  size is mildly enlarged.   4. Left atrial size was moderately dilated. No left atrial/left atrial  appendage thrombus was detected.   5. Right atrial size was moderately dilated.   6. A small pericardial effusion is present.   7. The mitral valve is normal in structure. Mild mitral valve  regurgitation.    8. The aortic valve is tricuspid. Aortic valve regurgitation is trivial.   9. There is mild (Grade II) plaque involving the descending aorta.   Patient Profile     87 y.o. male with a history of hypertension, hyperlipidemia, BPH, who is being seen and evaluated for new onset atrial fibrillation/atrial flutter RVR secondary to PNA, hypoxia, CHF, and new LBBB.  Assessment & Plan    New onset atrial fibrillation/atrial flutter RVR -underwent successful TEE/DCCV 10/29/2022 -remains in sinus on bedside cardiac monitor this morning -increased coreg to 12.5 mg bid with continued tachycardia with talking and activity -continued on apixaban 2.5 mg bid fir a CHA2DS2-Vasc of at least 4  Acute on chronic combined systolic and diastolic congestive heart failure -denies shortness of breath and remains on room air -euvolemic on exam -continued on coreg ( dose increased to 12. 5 BID today ) and jardiance -unable to currently escalate GDMT due to elevated serum creatinine  -will likely need continued ischemic work-up for decreased EF  Hypertension -blood pressure 121/76 -continued on coreg, imdur, and hydralazine -vital signs per unit protocol  Chronic kidney disease stage IIIb -serum creatine increased to 2.95 -daily bmp -monitor urine output -monitor/trend/replete electrolytes as needed -avoid nephrotoxic agents where able     For questions or updates, please contact  [facility] Please consult [email or web address] for contact info under        Signed, SHERI HAMMOCK, NP  10/30/2022, 7:32 AM     Attending Note:   The patient was seen and examined.  Agree with assessment and plan as noted above.  Changes made to the above note as needed.  Patient seen and independently examined with Frutoso Schatz , NP .   We discussed all aspects of the encounter. I agree with the assessment and plan as stated above.    Atrial Fib:   s/p TEE cardioversion .  Is maintaining NSR   2.   Acute  on chronic combined CHF :   on coreg, Jardiance. Creatinine is 2.95 so we are limited in what we can add.  Hold lasix - he appears euvolemic   3.  CKD :   creatinine is slightly higher.  I will see him on Monday, Dec. 4 for a follow up office visit and BP  Advised him to avoid foods that are high in potassium for now          I have spent a total of 40 minutes with patient reviewing hospital  notes , telemetry, EKGs, labs and examining patient as well as establishing an assessment and plan that was discussed with the patient.  > 50% of time was spent in direct patient care.    Vesta Mixer, Montez Hageman., MD, [facility] 10/30/2022, 9:59 AM [address]  [address] Office [phone] Pager [phone]

## 2022-10-30 NOTE — Progress Notes (Signed)
Heart Failure Nurse Navigator Progress Note  Visited with patient at time of discharge. Pt and spouse present. Reviewed HF educational material. Pt aware and able to attend close cardiology follow up appt with Dr. Elease Hashimoto 12/4. Inquired about driving, told to hold off until after appt on Monday. Spouse able to drive patient.   Dressed and ready for DC.   No further HF Navigation team needs noted.   Ozella Rocks, MSN, RN Heart Failure Nurse Navigator

## 2022-10-30 NOTE — Evaluation (Signed)
Occupational Therapy Evaluation Patient Details Name: Douglas Matthews MRN: [id number] DOB: Jul 05, 1935 Today's Date: 10/30/2022   History of Present Illness The pt is an 87 yo male presenting 11/24 with SOB on exertion. EKG concerning for atrial fibrillation with RVR and chest CT concerning for PNA. Work up also showed new LBBB, s/p TEE 11/29. PMH includes: HTN, HLD, BPH,   Clinical Impression   PTA pt lives independently at home with his wife and is very active, including going to the gym. VSS on RA and pt completing mobility and ADL tasks at baseline. Pt would benefit from further education on management of CHF and had questions about fluid intake - MD/nsg made aware. No further OT needs. OT signing off.      Recommendations for follow up therapy are one component of a multi-disciplinary discharge planning process, led by the attending physician.  Recommendations may be updated based on patient status, additional functional criteria and insurance authorization.   Follow Up Recommendations  No OT follow up     Assistance Recommended at Discharge Intermittent Supervision/Assistance  Patient can return home with the following      Functional Status Assessment  Patient has not had a recent decline in their functional status  Equipment Recommendations  None recommended by OT    Recommendations for Other Services       Precautions / Restrictions Precautions Precautions: None      Mobility Bed Mobility Overal bed mobility: Independent                  Transfers Overall transfer level: Independent                        Balance Overall balance assessment: No apparent balance deficits (not formally assessed)                                         ADL either performed or assessed with clinical judgement   ADL Overall ADL's : At baseline                                             Vision         Perception      Praxis      Pertinent Vitals/Pain Pain Assessment Pain Assessment: No/denies pain     Hand Dominance     Extremity/Trunk Assessment Upper Extremity Assessment Upper Extremity Assessment: Overall WFL for tasks assessed   Lower Extremity Assessment Lower Extremity Assessment: Overall WFL for tasks assessed   Cervical / Trunk Assessment Cervical / Trunk Assessment: Normal   Communication     Cognition Arousal/Alertness: Awake/alert Behavior During Therapy: WFL for tasks assessed/performed Overall Cognitive Status: Within Functional Limits for tasks assessed                                       General Comments  VSS on RA    Exercises     Shoulder Instructions      Home Living Family/patient expects to be discharged to:: Private residence Living Arrangements: Spouse/significant other Available Help at Discharge: Available 24 hours/day Type of Home: House Home Access: Stairs to enter [employer]  of Steps: 1   Home Layout: One level     Bathroom Shower/Tub: Tub/shower unit;Door;Curtain   Firefighter: Handicapped height Bathroom Accessibility: Yes How Accessible: Accessible via walker Home Equipment: None          Prior Functioning/Environment Prior Level of Function : Independent/Modified Independent;Driving;Other (comment) (goes to the gym)                        OT Problem List: Cardiopulmonary status limiting activity      OT Treatment/Interventions:      OT Goals(Current goals can be found in the care plan section) Acute Rehab OT Goals Patient Stated Goal: to not come back to the hospital OT Goal Formulation: All assessment and education complete, DC therapy  OT Frequency:      Co-evaluation              AM-PAC OT "6 Clicks" Daily Activity     Outcome Measure Help from another person eating meals?: None Help from another person taking care of personal grooming?: None Help from another person  toileting, which includes using toliet, bedpan, or urinal?: None Help from another person bathing (including washing, rinsing, drying)?: None Help from another person to put on and taking off regular upper body clothing?: None Help from another person to put on and taking off regular lower body clothing?: None 6 Click Score: 24   End of Session Nurse Communication: Other (comment) (CHF education needed)  Activity Tolerance: Patient tolerated treatment well Patient left: in bed;with call bell/phone within reach  OT Visit Diagnosis: Muscle weakness (generalized) (M62.81)                Time: 4098-1191 OT Time Calculation (min): 16 min Charges:  OT General Charges $OT Visit: 1 Visit OT Evaluation $OT Eval Low Complexity: 1 Low  Luisa Dago, OT/L   Acute OT Clinical Specialist Acute Rehabilitation Services Pager [phone] Office [phone]   [facility] 10/30/2022, 9:50 AM

## 2022-10-30 NOTE — Discharge Summary (Addendum)
Physician Discharge Summary   Patient: Douglas Matthews MRN: [id number] DOB: 1934-12-15  Admit date:     10/24/2022  Discharge date: 10/30/22  Discharge Physician: York Ram Marveline Profeta   PCP: [place], [name], MD   Recommendations at discharge:    Patient has been placed on after load reduction with hydralazine and isosorbide. Heart rate control with metoprolol and anticoagulation with apixaban As needed furosemide for dyspnea, lower extremity edema or weight gain 2 to 3 lbs in 24 hrs or 5 lbs in 7 days/ Follow up renal function and electrolytes within 7 days. Follow up with Dr [place] in 7 to 10 days. Follow up with Cardiology as scheduled.  Follow up with Nephrology as scheduled.   I spoke with patient's wife at the bedside, we talked in detail about patient's condition, plan of care and prognosis and all questions were addressed.   Discharge Diagnoses: Principal Problem:   New onset atrial fibrillation (HCC) Active Problems:   Acute on chronic systolic CHF (congestive heart failure) (HCC)   CAP (community acquired pneumonia)   Acute kidney injury superimposed on chronic kidney disease (HCC)   HTN (hypertension)  Resolved Problems:   * No resolved hospital problems. [facility] Course: Mr. [name] was admitted to the hospital with the working diagnosis of atrial fibrillation with RVR.   87 yo male with the past medical history of hypertension, dyslipidemia, chronic kidney disease stage 3b, and PBH who presented with dyspnea. Reported 7 days of dyspnea on exertion, with no edema or orthopnea. On his initial physical examination his blood pressure was 159/109, HR 133, RR 20 and 02 saturation 96%, lungs with no wheezing or rales, heart with S1 and S2 present, irregularly irregular, abdomen with no distention, no lower extremity edema.   Na 141, K 5,1 CL 114 bicarbonate 15 glucose 102 bun 57 cr 2,15  Mg 2.2  Wbc 6,3 hgb 9,4 plt 241  Sars covid 19 negative  Urinary  legionella and pneumococcal antigens negative   Chest radiograph with cardiomegaly, bilateral hilar vascular congestion, right lower lobe nodular infiltrate and left pleural effusion.   EKG 70 bpm, normal axis, normal qtc, atrial fibrillation rhythm with no significant ST segment or T wave changes.   Patient was placed on diuresis and antibiotic therapy.   11/29 TEE and direct current cardioversion.  11/30 Patient with improvement in his symptoms, plan to discharge home and follow up as outpatient.   Assessment and Plan: * New onset atrial fibrillation (HCC) Patient sp direct current cardioversion, successfully back on sinus rhythm.  Plan to continue rate control with carvedilol and anticoagulation with apixaban. PT with no fallow up needed.   Acute on chronic systolic CHF (congestive heart failure) (HCC) Echocardiogram with reduced LV systolic function. EF 25 to 30%, global hypokinesis, moderate LVH, RV systolic function with moderate reduction. RVSP 31.3, moderate LA dilatation. No significant valvular disease.   Patient will continue rate control with metoprolol.  Limited pharmacologic options due to low GFR. Added as needed furosemide for signs of volume overload.   Plan to continue with carvedilol, isosorbide/ hydralazine. Holding on RAS inhibition and SGLT 2 ing until renal function more stable.   Acute hypoxemic respiratory failure due to acute cardiogenic pulmonary edema.  Has improved with medical management.  At the time of his discharge his 02 saturation is 99% on room air.    CAP (community acquired pneumonia) Blood culture one bottle positive for gram positive rods Patient has been on cedfdinir for 5  days for pneumonia (present on admission). At the time of the discharge the sensitivities are still pending. It is possible a contaminated culture.  Patient has been afebrile.    Acute kidney injury superimposed on chronic kidney disease (HCC) CKD stage 3b to 4 (his  base serum cr is 2 to 3)   At the time of his discharge his renal function has a serum cr of 2,95 with K at 4,1 and serum bicarbonate at 19. Plan to continue diuresis as needed with furosemide and follow up renal function as outpatient. Hold on RAS inhibition or SGLT 2 inh for now.   HTN (hypertension) Continue blood pressure control with carvedilol, hydralazine and isosorbide.         Consultants: cardiology  Procedures performed: TEE and direct current cardioversion   Disposition: Home Diet recommendation:  Cardiac diet DISCHARGE MEDICATION: Allergies as of 10/30/2022   No Known Allergies      Medication List     STOP taking these medications    amLODipine 10 MG tablet Commonly known as: NORVASC       TAKE these medications    apixaban 2.5 MG Tabs tablet Commonly known as: ELIQUIS Take 1 tablet (2.5 mg total) by mouth 2 (two) times daily.   carvedilol 12.5 MG tablet Commonly known as: COREG Take 1 tablet (12.5 mg total) by mouth 2 (two) times daily with a meal.   COD LIVER OIL PO Take 1 tablet by mouth daily.   furosemide 40 MG tablet Commonly known as: LASIX Take 1 tablet (40 mg total) by mouth daily as needed for edema or fluid (as needed in case of weight gain 2 to 3 lbs in 24 hrs or 5 lbs in 7 days, leg swelling or shortness of breath.).   GARLIC PO Take 1 tablet by mouth daily.   hydrALAZINE 10 MG tablet Commonly known as: APRESOLINE Take 1 tablet (10 mg total) by mouth every 8 (eight) hours.   isosorbide mononitrate 30 MG 24 hr tablet Commonly known as: IMDUR Take 1 tablet (30 mg total) by mouth daily.   multivitamin capsule Take 1 capsule by mouth daily.   Saw Palmetto 450 MG Caps Take 900 mg by mouth daily.        Discharge Exam: Filed Weights   10/24/22 1540 10/25/22 0103 10/27/22 0431  Weight: 68 kg 66.6 kg 65.5 kg   BP 110/72 (BP Location: Left Arm)   Pulse 94   Temp 99 F (37.2 C) (Oral)   Resp 18   Ht 5\' 8"  (1.727 m)    Wt 65.5 kg   SpO2 99%   BMI 21.94 kg/m   Patient with no chest pain, palpitations or dyspnea.   Neurology awake and alert ENT with no pallor Cardiovascular with S1 and S2 present, positive extra beats, with no gallops or rubs, no murmurs No JVD No lower extremity edema Respiratory with no rales or wheezing Abdomen with no distention    Condition at discharge: stable  The results of significant diagnostics from this hospitalization (including imaging, microbiology, ancillary and laboratory) are listed below for reference.   Imaging Studies: ECHO TEE  Result Date: 10/29/2022    TRANSESOPHOGEAL ECHO REPORT   Patient Name:   SHALAMAR SHIMODA Date of Exam: 10/29/2022 Medical Rec #:  [id number]        Height:       68.0 in Accession #:    [id number]       Weight:  144.3 lb Date of Birth:  [birth year]       BSA:          1.779 m Patient Age:    87 years         BP:           82/62 mmHg Patient Gender: M                HR:           89 bpm. Exam Location:  Inpatient Procedure: Cardiac Doppler, Color Doppler and Transesophageal Echo Indications:     Atrial fibrillation  History:         Patient has prior history of Echocardiogram examinations, most                  recent 10/25/2022. CHF, Arrythmias:LBBB,                  Signs/Symptoms:Shortness of Breath; Risk Factors:Dyslipidemia                  and Hypertension.  Sonographer:     Clayton Lefort RDCS (AE) Referring Phys:  [id number] Wonda Cheng NAHSER Diagnosing Phys: Kirk Ruths MD PROCEDURE: After discussion of the risks and benefits of a TEE, an informed consent was obtained from the patient. The transesophogeal probe was passed without difficulty through the esophogus of the patient. Sedation performed by different physician. The patient was monitored while under deep sedation. Anesthestetic sedation was provided intravenously by Anesthesiology: 87.97mg  of Propofol, 60mg  of Lidocaine. Image quality was good. The patient developed no complications  during the procedure. A successful direct current cardioversion was performed at 200 joules with 1 attempt.  IMPRESSIONS  1. Pt subsequently underwent DCCV with 200J to sinus rhythm.  2. Left ventricular ejection fraction, by estimation, is 30 to 35%. The left ventricle has moderately decreased function. The left ventricle demonstrates global hypokinesis.  3. Right ventricular systolic function is normal. The right ventricular size is mildly enlarged.  4. Left atrial size was moderately dilated. No left atrial/left atrial appendage thrombus was detected.  5. Right atrial size was moderately dilated.  6. A small pericardial effusion is present.  7. The mitral valve is normal in structure. Mild mitral valve regurgitation.  8. The aortic valve is tricuspid. Aortic valve regurgitation is trivial.  9. There is mild (Grade II) plaque involving the descending aorta. FINDINGS  Left Ventricle: Left ventricular ejection fraction, by estimation, is 30 to 35%. The left ventricle has moderately decreased function. The left ventricle demonstrates global hypokinesis. The left ventricular internal cavity size was normal in size. Right Ventricle: The right ventricular size is mildly enlarged. Right ventricular systolic function is normal. Left Atrium: Left atrial size was moderately dilated. No left atrial/left atrial appendage thrombus was detected. Right Atrium: Right atrial size was moderately dilated. Pericardium: A small pericardial effusion is present. Mitral Valve: The mitral valve is normal in structure. Mild mitral valve regurgitation. Tricuspid Valve: The tricuspid valve is normal in structure. Tricuspid valve regurgitation is mild. Aortic Valve: The aortic valve is tricuspid. Aortic valve regurgitation is trivial. Pulmonic Valve: The pulmonic valve was normal in structure. Pulmonic valve regurgitation is trivial. Aorta: The aortic root is normal in size and structure. There is mild (Grade II) plaque involving the  descending aorta. IAS/Shunts: No atrial level shunt detected by color flow Doppler. Additional Comments: Pt subsequently underwent DCCV with 200J to sinus rhythm. RIGHT VENTRICLE RV Basal diam:  5.00 cm  AORTA Ao  Root diam: 3.70 cm Ao Asc diam:  3.50 cm Olga Millers MD Electronically signed by Olga Millers MD Signature Date/Time: 10/29/2022/2:17:42 PM    Final    NM Pulmonary Perfusion  Result Date: 10/27/2022 CLINICAL DATA:  Elevated D-dimer. Chronic short of breath. Congestive heart failure. EXAM: NUCLEAR MEDICINE PERFUSION LUNG SCAN TECHNIQUE: Perfusion images were obtained in multiple projections after intravenous injection of radiopharmaceutical. RADIOPHARMACEUTICALS:  4.0 mCi Tc-37m MAA COMPARISON:  Radiograph 10/27/2022 FINDINGS: No wedge-shaped peripheral perfusion defect within LEFT or RIGHT lung to suggest acute pulmonary embolism. IMPRESSION: No evidence acute pulmonary embolism. Electronically Signed   By: Genevive Bi M.D.   On: 10/27/2022 15:50   DG Chest 2 View  Result Date: 10/27/2022 CLINICAL DATA:  Atrial fibrillation EXAM: CHEST - 2 VIEW COMPARISON:  10/24/2022 FINDINGS: Normal cardiac silhouette. There are bilateral pleural effusions increased from comparison exam. Effusions are moderate layering. Upper lungs clear. No pneumothorax. IMPRESSION: Increasing bilateral pleural effusions. Electronically Signed   By: Genevive Bi M.D.   On: 10/27/2022 14:19   ECHOCARDIOGRAM COMPLETE  Result Date: 10/25/2022    ECHOCARDIOGRAM REPORT   Patient Name:   Douglas Matthews Date of Exam: 10/25/2022 Medical Rec #:  [id number]        Height:       68.0 in Accession #:    [id number]       Weight:       146.8 lb Date of Birth:  Nov 25, 1935       BSA:          1.792 m Patient Age:    86 years         BP:           153/103 mmHg Patient Gender: M                HR:           93 bpm. Exam Location:  Inpatient Procedure: 2D Echo, Cardiac Doppler and Color Doppler Indications:    Atrial  fibrillation  History:        Patient has prior history of Echocardiogram examinations, most                 recent 05/06/2016. CHF, Arrythmias:LBBB, Signs/Symptoms:Shortness                 of Breath; Risk Factors:Dyslipidemia and Hypertension.  Sonographer:    Ross Ludwig RDCS (AE) Referring Phys: John Giovanni  Sonographer Comments: Suboptimal parasternal window. IMPRESSIONS  1. There is substantial septal-lateral left ventricular dyssynchrony. Left ventricular ejection fraction, by estimation, is 25 to 30%. The left ventricle has severely decreased function. The left ventricle demonstrates global hypokinesis. There is moderate concentric left ventricular hypertrophy. Left ventricular diastolic function could not be evaluated.  2. Right ventricular systolic function is moderately reduced. The right ventricular size is normal. There is normal pulmonary artery systolic pressure. The estimated right ventricular systolic pressure is 31.3 mmHg.  3. Left atrial size was mild to moderately dilated.  4. Right atrial size was mildly dilated.  5. The mitral valve is normal in structure. Mild to moderate mitral valve regurgitation.  6. The aortic valve has an indeterminate number of cusps. There is moderate calcification of the aortic valve. There is moderate thickening of the aortic valve. Mild to moderate aortic valve stenosis. Gradients are low due to poor LV function.  7. The inferior vena cava is dilated in size with <50% respiratory variability, suggesting right atrial pressure of 15 mmHg.  Comparison(s): The left ventricular function is significantly worse. The right ventricular systolic function is worse. FINDINGS  Left Ventricle: There is substantial septal-lateral left ventricular dyssynchrony. Left ventricular ejection fraction, by estimation, is 25 to 30%. The left ventricle has severely decreased function. The left ventricle demonstrates global hypokinesis. The left ventricular internal cavity size was normal  in size. There is moderate concentric left ventricular hypertrophy. Abnormal (paradoxical) septal motion, consistent with left bundle branch block. Left ventricular diastolic function could not be evaluated due to atrial fibrillation. Left ventricular diastolic function could not be evaluated. Right Ventricle: The right ventricular size is normal. No increase in right ventricular wall thickness. Right ventricular systolic function is moderately reduced. There is normal pulmonary artery systolic pressure. The tricuspid regurgitant velocity is 2.02 m/s, and with an assumed right atrial pressure of 15 mmHg, the estimated right ventricular systolic pressure is 123456 mmHg. Left Atrium: Left atrial size was mild to moderately dilated. Right Atrium: Right atrial size was mildly dilated. Pericardium: Trivial pericardial effusion is present. Mitral Valve: The mitral valve is normal in structure. Mild to moderate mitral valve regurgitation, with centrally-directed jet. MV peak gradient, 6.5 mmHg. The mean mitral valve gradient is 3.0 mmHg. Tricuspid Valve: The tricuspid valve is normal in structure. Tricuspid valve regurgitation is trivial. Aortic Valve: The aortic valve has an indeterminant number of cusps. There is moderate calcification of the aortic valve. There is moderate thickening of the aortic valve. Aortic valve regurgitation is not visualized. Mild to moderate aortic stenosis is present. Aortic valve mean gradient measures 7.0 mmHg. Aortic valve peak gradient measures 12.0 mmHg. Aortic valve area, by VTI measures 1.47 cm. Pulmonic Valve: The pulmonic valve was grossly normal. Pulmonic valve regurgitation is not visualized. Aorta: The aortic root is normal in size and structure. Venous: The inferior vena cava is dilated in size with less than 50% respiratory variability, suggesting right atrial pressure of 15 mmHg. IAS/Shunts: No atrial level shunt detected by color flow Doppler.  LEFT VENTRICLE PLAX 2D LVIDd:          4.70 cm LV PW:         1.60 cm LV IVS:        1.40 cm LVOT diam:     2.05 cm LV SV:         39 LV SV Index:   22 LVOT Area:     3.30 cm  LV Volumes (MOD) LV vol d, MOD A2C: 153.0 ml LV vol d, MOD A4C: 109.0 ml LV vol s, MOD A2C: 83.1 ml LV vol s, MOD A4C: 85.1 ml LV SV MOD A2C:     69.9 ml LV SV MOD A4C:     109.0 ml LV SV MOD BP:      46.4 ml RIGHT VENTRICLE            IVC RV Basal diam:  3.10 cm    IVC diam: 2.20 cm RV S prime:     7.62 cm/s TAPSE (M-mode): 1.6 cm LEFT ATRIUM             Index        RIGHT ATRIUM           Index LA diam:        4.30 cm 2.40 cm/m   RA Area:     18.60 cm LA Vol (A2C):   63.7 ml 35.55 ml/m  RA Volume:   54.90 ml  30.64 ml/m LA Vol (A4C):   45.1 ml 25.17  ml/m LA Biplane Vol: 56.2 ml 31.36 ml/m  AORTIC VALVE AV Area (Vmax):    1.71 cm AV Area (Vmean):   1.59 cm AV Area (VTI):     1.47 cm AV Vmax:           173.00 cm/s AV Vmean:          126.000 cm/s AV VTI:            0.268 m AV Peak Grad:      12.0 mmHg AV Mean Grad:      7.0 mmHg LVOT Vmax:         89.70 cm/s LVOT Vmean:        60.733 cm/s LVOT VTI:          0.120 m LVOT/AV VTI ratio: 0.45  AORTA Ao Root diam: 3.20 cm MITRAL VALVE              TRICUSPID VALVE MV Peak grad: 6.5 mmHg    TR Peak grad:   16.3 mmHg MV Mean grad: 3.0 mmHg    TR Vmax:        201.68 cm/s MV Vmax:      1.27 m/s MV Vmean:     87.7 cm/s   SHUNTS MR Peak grad: 68.6 mmHg   Systemic VTI:  0.12 m MR Mean grad: 49.0 mmHg   Systemic Diam: 2.05 cm MR Vmax:      414.00 cm/s MR Vmean:     340.0 cm/s Dani Gobble Croitoru MD Electronically signed by Sanda Klein MD Signature Date/Time: 10/25/2022/4:08:36 PM    Final    DG Chest Port 1 View  Result Date: 10/24/2022 CLINICAL DATA:  Shortness of breath. EXAM: PORTABLE CHEST 1 VIEW COMPARISON:  None Available. FINDINGS: The heart is enlarged. There is focal patchy density at the LATERAL RIGHT lung base, consistent with infectious or inflammatory process. There is minimal atelectasis in the LEFT lung base. No  evidence for pulmonary edema. IMPRESSION: 1. Cardiomegaly. 2. RIGHT LOWER lobe infiltrate. Followup PA and lateral chest X-ray is recommended in 3-4 weeks following trial of antibiotic therapy to ensure resolution and exclude underlying malignancy. Electronically Signed   By: Nolon Nations M.D.   On: 10/24/2022 16:17    Microbiology: Results for orders placed or performed during the hospital encounter of 10/24/22  Blood culture (routine x 2)     Status: None   Collection Time: 10/24/22  4:55 PM   Specimen: BLOOD LEFT FOREARM  Result Value Ref Range Status   Specimen Description   Final    BLOOD LEFT FOREARM Performed at [facility], [place] [address], [place], [place] [address]    Special Requests   Final    BOTTLES DRAWN AEROBIC AND ANAEROBIC Blood Culture adequate volume Performed at [facility], [address], [place], [place] [address]    Culture   Final    NO GROWTH 5 DAYS Performed at [facility], [place] [address], [place], [place] [address]    Report Status 10/30/2022 FINAL  Final  Blood culture (routine x 2)     Status: None (Preliminary result)   Collection Time: 10/24/22  4:58 PM   Specimen: BLOOD RIGHT FOREARM  Result Value Ref Range Status   Specimen Description   Final    BLOOD RIGHT FOREARM Performed at [facility], [place] [address], [place], [place] [address]    Special Requests   Final    BOTTLES DRAWN AEROBIC AND ANAEROBIC Blood  Culture adequate volume Performed at [facility], [place], [place] [address]    Culture  Setup Time   Final    GRAM POSITIVE RODS ANAEROBIC BOTTLE ONLY CRITICAL RESULT CALLED TO, READ BACK BY AND VERIFIED WITH:  PHARMD ANNIE Scio 11.29.23 AT 1250 BY EM    Culture   Final    GRAM POSITIVE RODS TOO YOUNG TO READ Performed at [facility], [place] [address], [place], [place] [address]    Report Status PENDING  Incomplete  SARS Coronavirus 2 by RT PCR  (hospital order, performed in [facility] hospital lab) *cepheid single result test* Anterior Nasal Swab     Status: None   Collection Time: 10/24/22  6:30 PM   Specimen: Anterior Nasal Swab  Result Value Ref Range Status   SARS Coronavirus 2 by RT PCR NEGATIVE NEGATIVE Final    Comment: (NOTE) SARS-CoV-2 target nucleic acids are NOT DETECTED.  The SARS-CoV-2 RNA is generally detectable in upper and lower respiratory specimens during the acute phase of infection. The lowest concentration of SARS-CoV-2 viral copies this assay can detect is 250 copies / mL. A negative result does not preclude SARS-CoV-2 infection and should not be used as the sole basis for treatment or other patient management decisions.  A negative result may occur with improper specimen collection / handling, submission of specimen other than nasopharyngeal swab, presence of viral mutation(s) within the areas targeted by this assay, and inadequate number of viral copies (<250 copies / mL). A negative result must be combined with clinical observations, patient history, and epidemiological information.  Fact Sheet for Patients:   https://www.patel.info/  Fact Sheet for Healthcare Providers: https://hall.com/  This test is not yet approved or  cleared by the [place] FDA and has been authorized for detection and/or diagnosis of SARS-CoV-2 by FDA under an Emergency Use Authorization (EUA).  This EUA will remain in effect (meaning this test can be used) for the duration of the COVID-19 declaration under Section 564(b)(1) of the Act, 21 U.S.C. section 360bbb-3(b)(1), unless the authorization is terminated or revoked sooner.  Performed at [facility], [address], [place], [place] [address]     Labs: CBC: Recent Labs  Lab 10/25/22 0615 10/26/22 0200 10/27/22 0219 10/28/22 0244 10/29/22 0326  WBC 6.3 7.1 7.2 7.1 6.9  HGB 9.0* 9.3* 8.2*  8.6* 9.0*  HCT 27.1* 27.4* 24.6* 26.3* 27.4*  MCV 88.0 87.5 87.5 89.5 89.0  PLT 235 258 212 245 [id number]   Basic Metabolic Panel: Recent Labs  Lab 10/25/22 0615 10/27/22 0219 10/28/22 1156 10/29/22 0326 10/30/22 0358  NA 141 141 140 136 141  K 5.1 4.5 4.2 3.9 4.1  CL 114* 111 111 109 110  CO2 15* 17* 18* 17* 19*  GLUCOSE 102* 108* 103* 100* 93  BUN 57* 65* 52* 58* 62*  CREATININE 2.15* 2.43* 2.46* 2.75* 2.95*  CALCIUM 9.1 8.8* 8.9 8.2* 8.6*  MG 2.2  --   --   --   --    Liver Function Tests: No results for input(s): "AST", "ALT", "ALKPHOS", "BILITOT", "PROT", "ALBUMIN" in the last 168 hours. CBG: Recent Labs  Lab 10/25/22 0734 10/25/22 1211 10/25/22 1624  GLUCAP 93 110* 133*    Discharge time spent: greater than 30 minutes.  Signed: Tawni Millers, MD [facility] 10/30/2022

## 2022-10-30 NOTE — Progress Notes (Signed)
   Heart Failure Stewardship Pharmacist Progress Note   PCP: [place], [name], MD PCP-Cardiologist: Kristeen Miss, MD    HPI:  Douglas Matthews is a 87 y.o. male with medical history significant of hypertension, hyperlipidemia, CKD stage IIIb, Paget's disease, and BPH who presented with worsening shortness of breath x 2 days.  He was found to be in new onset atrial fibrillation/flutter with RVR with rate in the 140s.  Cardiology noted presentation not consistent with NSTEMI.  Afib was initially managed with diltiazem, however was stopped after echocardiogram 11/25 showed LVEF of 25-30% with moderately reduced RV, mild to moderate mitral regurgitation and aortic stenosis. LV noted to be dyssynchronous.  Successful DCCV 11/29.  Discharge HF Medications: Diuretic: furosemide 40 mg daily PRN Beta Blocker: Coreg 12.5 mg BID Other: Hydralazine 10 mg every 8 hours, Imdur 30 mg QD  Prior to admission HF Medications: None  Pertinent Lab Values: Serum creatinine 2.95, BUN 62, Potassium 4.1, Sodium 141, BNP 721.7, Magnesium 2.2   Vital Signs: Weight: 144 lbs (admission weight: 146.8 lbs) Blood pressure: 120/70s  Heart rate: 90s - NSR I/O: slightly positive yesterday and this admission  Medication Assistance / Insurance Benefits Check: Does the patient have prescription insurance?  Yes Type of insurance plan: Medicare  Outpatient Pharmacy:  Prior to admission [facility] Is the patient willing to use [facility] TOC pharmacy at discharge? Yes Is the patient willing to transition their outpatient pharmacy to utilize a [facility] outpatient pharmacy?   No    Assessment: 1. New onset systolic CHF (LVEF 25-30%), due to non-ICM. NYHA class I-II symptoms. - Maintain strict I/Os, daily weights, Mg >2, and K >4. - Creatinine is elevated, Jardiance stopped. Still unable to add RAASi or spironolactone. - BP is labile. No changes today to allow for renal perfusion. - Agree with PRN lasix at  discharge - Agree with increasing carvedilol 12.5 mg BID   Plan: 1) Medication changes recommended at this time: - None at this time  2) Patient assistance: - None  3)  Education  - Patient has been educated on current HF medications and potential additions to HF medication regimen - Patient verbalizes understanding that over the next few months, these medication doses may change and more medications may be added to optimize HF regimen - Patient has been educated on basic disease state pathophysiology and goals of therapy  Sharen Hones, PharmD, BCPS Heart Failure Stewardship Pharmacist Phone [phone]

## 2022-10-30 NOTE — Progress Notes (Signed)
PT Cancellation Note  Patient Details Name: KAEDON FANELLI MRN: [id number] DOB: [birth year]   Cancelled Treatment:    Reason Eval/Treat Not Completed: PT screened, no needs identified, will sign off. Per discussion with OT, pt is mobilizing independently and has no current needs for acute PT or follow up therapies. Please feel free to re-consult if change in status or mobility.   Vickki Muff, PT, DPT   Acute Rehabilitation Department   Ronnie Derby 10/30/2022, 10:50 AM

## 2022-10-30 NOTE — Care Management Important Message (Signed)
Important Message  Patient Details  Name: Douglas Matthews MRN: [id number] Date of Birth: [birth year]   Medicare Important Message Given:  Yes     Renie Ora 10/30/2022, 10:44 AM

## 2022-10-30 NOTE — Progress Notes (Signed)
CARDIAC REHAB PHASE I   PRE:  Rate/Rhythm: 81 SR   BP:  Sitting: 121/78      SaO2: 96 RA   MODE:  Ambulation: 470 ft   POST:  Rate/Rhythm: 131 ST   BP:  Sitting: 149/87      SaO2: 95 RA    Pt ambulated in hall with standby assist only. Tolerating well with no cp, dizziness or sob. Max heart rate 133 returned quickly to 90's range upon returning to bed. In bed with call bell and bedside table in reach. Will continue to follow.   [id number]  Woodroe Chen, RN BSN 10/30/2022 8:42 AM

## 2022-10-31 ENCOUNTER — Encounter (HOSPITAL_COMMUNITY): Payer: Self-pay | Admitting: Cardiology

## 2022-10-31 LAB — CULTURE, BLOOD (ROUTINE X 2): Special Requests: ADEQUATE

## 2022-11-02 ENCOUNTER — Encounter: Payer: Self-pay | Admitting: Cardiovascular Disease

## 2022-11-02 NOTE — Progress Notes (Unsigned)
Cardiology Office Note:    Date:  11/03/2022   ID:  Douglas Matthews, DOB 03/23/1935, MRN [id number]  PCP:  [place], [name], [place] Providers Cardiologist:  Mertie Moores, MD {     Referring MD: [place], [name], MD   Chief Complaint  Patient presents with   Atrial Fibrillation          History of Present Illness:    Douglas Matthews is a 87 y.o. male with a hx of  Atrial fib/ flutter, HLD, HTN  I met him in the hospital in Nov. 2023 with atrial flutter, new LBBB ,  Has new Dx of CHF Had successful TEE / CV  Is on eliquis 2.5 BID   Breathing has continued to improve.   Pt is on coreg 12.5 BID , hydralazine 10 mg TId, , Imdur 30 mg a day  Has lasix as needed - has not needed it    Past Medical History:  Diagnosis Date   Anemia    BPH (benign prostatic hypertrophy)    Diverticulosis    Hyperlipidemia    Hypertension     Past Surgical History:  Procedure Laterality Date   APPENDECTOMY     CARDIOVERSION N/A 10/29/2022   Procedure: CARDIOVERSION;  Surgeon: Lelon Perla, MD;  Location: [place];  Service: Cardiovascular;  Laterality: N/A;   HERNIA REPAIR     TEE WITHOUT CARDIOVERSION N/A 10/29/2022   Procedure: TRANSESOPHAGEAL ECHOCARDIOGRAM (TEE);  Surgeon: Lelon Perla, MD;  Location: [facility] ENDOSCOPY;  Service: Cardiovascular;  Laterality: N/A;    Current Medications: Current Meds  Medication Sig   apixaban (ELIQUIS) 2.5 MG TABS tablet Take 1 tablet (2.5 mg total) by mouth 2 (two) times daily.   carvedilol (COREG) 12.5 MG tablet Take 1 tablet (12.5 mg total) by mouth 2 (two) times daily with a meal.   COD LIVER OIL PO Take 1 tablet by mouth daily.   furosemide (LASIX) 40 MG tablet Take 1 tablet (40 mg total) by mouth daily as needed for edema or fluid (as needed in case of weight gain 2 to 3 lbs in 24 hrs or 5 lbs in 7 days, leg swelling or shortness of breath.).   GARLIC PO Take 1 tablet by mouth daily.   hydrALAZINE  (APRESOLINE) 10 MG tablet Take 1 tablet (10 mg total) by mouth every 8 (eight) hours.   isosorbide mononitrate (IMDUR) 30 MG 24 hr tablet Take 1 tablet (30 mg total) by mouth daily.   Multiple Vitamin (MULTIVITAMIN) capsule Take 1 capsule by mouth daily.   Saw Palmetto 450 MG CAPS Take 900 mg by mouth daily.     Allergies:   Patient has no known allergies.   Social History   Socioeconomic History   Marital status: Married    Spouse name: Not on file   Number of children: Not on file   Years of education: Not on file   Highest education level: Not on file  Occupational History   Occupation: Retired  Tobacco Use   Smoking status: Never   Smokeless tobacco: Never  Vaping Use   Vaping Use: Never used  Substance and Sexual Activity   Alcohol use: No   Drug use: No   Sexual activity: Not on file  Other Topics Concern   Not on file  Social History Narrative   Married x 50 years    Social Determinants of Health   Financial Resource Strain: Low Risk  (07/08/2022)  Overall Financial Resource Strain (CARDIA)    Difficulty of Paying Living Expenses: Not hard at all  Food Insecurity: No Food Insecurity (10/25/2022)   Hunger Vital Sign    Worried About Running Out of Food in the Last Year: Never true    Ran Out of Food in the Last Year: Never true  Transportation Needs: No Transportation Needs (10/25/2022)   PRAPARE - Hydrologist (Medical): No    Lack of Transportation (Non-Medical): No  Physical Activity: Insufficiently Active (07/08/2022)   Exercise Vital Sign    Days of Exercise per Week: 2 days    Minutes of Exercise per Session: 30 min  Stress: No Stress Concern Present (07/08/2022)   [place]    Feeling of Stress : Not at all  Social Connections: Moderately Integrated (07/02/2021)   Social Connection and Isolation Panel [NHANES]    Frequency of Communication with Friends and Family:  Three times a week    Frequency of Social Gatherings with Friends and Family: Three times a week    Attends Religious Services: More than 4 times per year    Active Member of Clubs or Organizations: No    Attends Archivist Meetings: Never    Marital Status: Married     Family History: The patient's family history includes Heart disease in an other family member.  ROS:   Please see the history of present illness.     All other systems reviewed and are negative.  EKGs/Labs/Other Studies Reviewed:    The following studies were reviewed today:   EKG:   Recent Labs: 10/24/2022: B Natriuretic Peptide 721.7 10/25/2022: Magnesium 2.2; TSH 4.260 10/29/2022: Hemoglobin 9.0; Platelets 268 10/30/2022: BUN 62; Creatinine, Ser 2.95; Potassium 4.1; Sodium 141  Recent Lipid Panel    Component Value Date/Time   CHOL 259 (H) 05/08/2021 0950   TRIG 87.0 05/08/2021 0950   HDL 59.40 05/08/2021 0950   CHOLHDL 4 05/08/2021 0950   VLDL 17.4 05/08/2021 0950   LDLCALC 182 (H) 05/08/2021 0950   LDLDIRECT 220.5 12/15/2011 1137     Risk Assessment/Calculations:    CHA2DS2-VASc Score = 4   This indicates a 4.8% annual risk of stroke. The patient's score is based upon: CHF History: 1 HTN History: 1 Diabetes History: 0 Stroke History: 0 Vascular Disease History: 0 Age Score: 2 Gender Score: 0               Physical Exam:    VS:  BP 110/66   Pulse 68   Ht _0  (1.727 m)   Wt 144 lb 9.6 oz (65.6 kg)   SpO2 99%   BMI 21.99 kg/m     Wt Readings from Last 3 Encounters:  11/03/22 144 lb 9.6 oz (65.6 kg)  10/27/22 144 lb 4.8 oz (65.5 kg)  07/08/22 150 lb 4.8 oz (68.2 kg)     GEN:  Well nourished, well developed in no acute distress HEENT: Normal NECK: No JVD; No carotid bruits LYMPHATICS: No lymphadenopathy CARDIAC: RRR, no murmurs, rubs, gallops RESPIRATORY:  Clear to auscultation without rales, wheezing or rhonchi  ABDOMEN: Soft, non-tender,  non-distended MUSCULOSKELETAL:  No edema; No deformity  SKIN: Warm and dry NEUROLOGIC:  Alert and oriented x 3 PSYCHIATRIC:  Normal affect   ASSESSMENT:    1. Chronic combined systolic and diastolic heart failure ([place])   2. PAF (paroxysmal atrial fibrillation) ([place])   3. Acute on chronic systolic  CHF (congestive heart failure) ([place])   4. Primary hypertension    PLAN:    In order of problems listed above:  Acute on chronic combined systolic and diastolic congestive heart failure: Janes is seen today for follow-up of his recently diagnosed congestive heart failure.  I met him in the hospital several weeks ago.  He had rapid atrial fibrillation and hypertension.  We have cardioverted him back to normal and his heart rate is much better.  He is on good medical therapy including isosorbide, hydralazine, carvedilol.  He has had some acute worsening of his CKD so we have avoided spironolactone and Entresto for now.  I will see him again in 3 months.  Will get an echocardiogram a week before his visit.  2.  Atrial fibrillation: His rate is very regular and with a normal heart rate. We do not have an EKG from today but I suspect he is in normal sinus rhythm.    Cardiac Rehabilitation Eligibility Assessment            Medication Adjustments/Labs and Tests Ordered: Current medicines are reviewed at length with the patient today.  Concerns regarding medicines are outlined above.  Orders Placed This Encounter  Procedures   Basic metabolic panel   CBC   ECHOCARDIOGRAM COMPLETE   No orders of the defined types were placed in this encounter.   Patient Instructions  Medication Instructions:  Your physician recommends that you continue on your current medications as directed. Please refer to the Current Medication list given to you today.  *If you need a refill on your cardiac medications before your next appointment, please call your pharmacy*   Lab Work: BMET, CBC today If you  have labs (blood work) drawn today and your tests are completely normal, you will receive your results only by: [place] (if you have MyChart) OR A paper copy in the mail If you have any lab test that is abnormal or we need to change your treatment, we will call you to review the results.   Testing/Procedures: ECHO (in 3 months) Your physician has requested that you have an echocardiogram. Echocardiography is a painless test that uses sound waves to create images of your heart. It provides your doctor with information about the size and shape of your heart and how well your heart's chambers and valves are working. This procedure takes approximately one hour. There are no restrictions for this procedure. Please do NOT wear cologne, perfume, aftershave, or lotions (deodorant is allowed). Please arrive 15 minutes prior to your appointment time.  Follow-Up: At [facility], you and your health needs are our priority.  As part of our continuing mission to provide you with exceptional heart care, we have created designated Provider Care Teams.  These Care Teams include your primary Cardiologist (physician) and Advanced Practice Providers (APPs -  Physician Assistants and Nurse Practitioners) who all work together to provide you with the care you need, when you need it.  Your next appointment:   3 month(s)  The format for your next appointment:   In Person  Provider:   Mertie Moores, MD       Important Information About Sugar         Signed, Mertie Moores, MD  11/03/2022 5:16 PM    [place]

## 2022-11-03 ENCOUNTER — Encounter: Payer: Self-pay | Admitting: Cardiovascular Disease

## 2022-11-03 ENCOUNTER — Ambulatory Visit: Payer: Medicare Other | Attending: Cardiovascular Disease | Admitting: Cardiovascular Disease

## 2022-11-03 VITALS — BP 110/66 | HR 68 | Ht 68.0 in | Wt 144.6 lb

## 2022-11-03 DIAGNOSIS — I5023 Acute on chronic systolic (congestive) heart failure: Secondary | ICD-10-CM | POA: Diagnosis not present

## 2022-11-03 DIAGNOSIS — I5042 Chronic combined systolic (congestive) and diastolic (congestive) heart failure: Secondary | ICD-10-CM | POA: Diagnosis not present

## 2022-11-03 DIAGNOSIS — I480 Paroxysmal atrial fibrillation: Secondary | ICD-10-CM | POA: Diagnosis not present

## 2022-11-03 DIAGNOSIS — I10 Essential (primary) hypertension: Secondary | ICD-10-CM | POA: Insufficient documentation

## 2022-11-03 NOTE — Patient Instructions (Signed)
Medication Instructions:  Your physician recommends that you continue on your current medications as directed. Please refer to the Current Medication list given to you today.  *If you need a refill on your cardiac medications before your next appointment, please call your pharmacy*   Lab Work: BMET, CBC today If you have labs (blood work) drawn today and your tests are completely normal, you will receive your results only by: MyChart Message (if you have MyChart) OR A paper copy in the mail If you have any lab test that is abnormal or we need to change your treatment, we will call you to review the results.   Testing/Procedures: ECHO (in 3 months) Your physician has requested that you have an echocardiogram. Echocardiography is a painless test that uses sound waves to create images of your heart. It provides your doctor with information about the size and shape of your heart and how well your heart's chambers and valves are working. This procedure takes approximately one hour. There are no restrictions for this procedure. Please do NOT wear cologne, perfume, aftershave, or lotions (deodorant is allowed). Please arrive 15 minutes prior to your appointment time.  Follow-Up: At [facility], you and your health needs are our priority.  As part of our continuing mission to provide you with exceptional heart care, we have created designated Provider Care Teams.  These Care Teams include your primary Cardiologist (physician) and Advanced Practice Providers (APPs -  Physician Assistants and Nurse Practitioners) who all work together to provide you with the care you need, when you need it.  Your next appointment:   3 month(s)  The format for your next appointment:   In Person  Provider:   Kristeen Miss, MD       Important Information About Sugar

## 2022-11-06 ENCOUNTER — Telehealth: Payer: Self-pay | Admitting: Cardiovascular Disease

## 2022-11-06 DIAGNOSIS — E876 Hypokalemia: Secondary | ICD-10-CM

## 2022-11-06 DIAGNOSIS — I5042 Chronic combined systolic (congestive) and diastolic (congestive) heart failure: Secondary | ICD-10-CM

## 2022-11-06 LAB — CBC
Hematocrit: 28.3 % — ABNORMAL LOW (ref 37.5–51.0)
Hemoglobin: 8.9 g/dL — ABNORMAL LOW (ref 13.0–17.7)
MCH: 28.4 pg (ref 26.6–33.0)
MCHC: 31.4 g/dL — ABNORMAL LOW (ref 31.5–35.7)
MCV: 90 fL (ref 79–97)
Platelets: 326 10*3/uL (ref 150–450)
RBC: 3.13 x10E6/uL — ABNORMAL LOW (ref 4.14–5.80)
RDW: 13.8 % (ref 11.6–15.4)
WBC: 4.2 10*3/uL (ref 3.4–10.8)

## 2022-11-06 LAB — BASIC METABOLIC PANEL
BUN/Creatinine Ratio: 26 — ABNORMAL HIGH (ref 10–24)
BUN: 73 mg/dL — ABNORMAL HIGH (ref 8–27)
CO2: 18 mmol/L — ABNORMAL LOW (ref 20–29)
Calcium: 8.8 mg/dL (ref 8.6–10.2)
Chloride: 109 mmol/L — ABNORMAL HIGH (ref 96–106)
Creatinine, Ser: 2.78 mg/dL — ABNORMAL HIGH (ref 0.76–1.27)
Glucose: 123 mg/dL — ABNORMAL HIGH (ref 70–99)
Potassium: 5.9 mmol/L (ref 3.5–5.2)
Sodium: 144 mmol/L (ref 134–144)
eGFR: 22 mL/min/{1.73_m2} — ABNORMAL LOW (ref >59)

## 2022-11-06 NOTE — Telephone Encounter (Signed)
Returned call to patient and reviewed lab results and Dr Harvie Bridge recommendations. Patient verbalized understanding and had no further questions.

## 2022-11-06 NOTE — Telephone Encounter (Signed)
   Pam- [facility] to give critical result

## 2022-11-06 NOTE — Telephone Encounter (Signed)
Critical results called in from [facility] for patient. Potassium is 5.9.  Reviewed with Dr Lynnette Caffey and spoke with patient. Patient will watch potassium intake for several days and come in for repeat lab work on 11/11/22.  Provided education on potassium rich foods to avoid. Patient verbalized understanding.

## 2022-11-06 NOTE — Telephone Encounter (Signed)
Patient was returning call for results. Please advise °

## 2022-11-11 ENCOUNTER — Ambulatory Visit: Payer: Medicare Other | Attending: Internal Medicine

## 2022-11-11 ENCOUNTER — Telehealth (HOSPITAL_COMMUNITY): Payer: Self-pay

## 2022-11-11 DIAGNOSIS — I5042 Chronic combined systolic (congestive) and diastolic (congestive) heart failure: Secondary | ICD-10-CM

## 2022-11-11 DIAGNOSIS — E876 Hypokalemia: Secondary | ICD-10-CM

## 2022-11-11 NOTE — Telephone Encounter (Signed)
Attempted to call patient in regards to Cardiac Rehab - LM on VM 

## 2022-11-12 LAB — BASIC METABOLIC PANEL
BUN/Creatinine Ratio: 31 — ABNORMAL HIGH (ref 10–24)
BUN: 84 mg/dL (ref 8–27)
CO2: 17 mmol/L — ABNORMAL LOW (ref 20–29)
Calcium: 9.1 mg/dL (ref 8.6–10.2)
Chloride: 110 mmol/L — ABNORMAL HIGH (ref 96–106)
Creatinine, Ser: 2.71 mg/dL — ABNORMAL HIGH (ref 0.76–1.27)
Glucose: 82 mg/dL (ref 70–99)
Potassium: 5.5 mmol/L — ABNORMAL HIGH (ref 3.5–5.2)
Sodium: 141 mmol/L (ref 134–144)
eGFR: 22 mL/min/{1.73_m2} — ABNORMAL LOW (ref >59)

## 2022-11-21 ENCOUNTER — Other Ambulatory Visit: Payer: Self-pay

## 2022-11-21 ENCOUNTER — Other Ambulatory Visit (HOSPITAL_COMMUNITY): Payer: Self-pay

## 2022-11-21 MED ORDER — HYDRALAZINE HCL 10 MG PO TABS: 10.0000 mg | ORAL_TABLET | Freq: Three times a day (TID) | ORAL | 1 refills | Status: DC

## 2022-11-21 MED ORDER — CARVEDILOL 12.5 MG PO TABS: 12.5000 mg | ORAL_TABLET | Freq: Two times a day (BID) | ORAL | 1 refills | Status: DC

## 2022-11-21 MED ORDER — FUROSEMIDE 40 MG PO TABS: 40.0000 mg | ORAL_TABLET | Freq: Every day | ORAL | 3 refills | Status: DC | PRN

## 2022-11-21 MED ORDER — APIXABAN 2.5 MG PO TABS: 2.5000 mg | ORAL_TABLET | Freq: Two times a day (BID) | ORAL | 1 refills | Status: DC

## 2022-11-21 MED ORDER — ISOSORBIDE MONONITRATE ER 30 MG PO TB24: 30.0000 mg | ORAL_TABLET | Freq: Every day | ORAL | 1 refills | Status: DC

## 2022-12-17 ENCOUNTER — Telehealth (HOSPITAL_COMMUNITY): Payer: Self-pay

## 2022-12-17 ENCOUNTER — Encounter (HOSPITAL_COMMUNITY): Payer: Self-pay

## 2022-12-17 NOTE — Telephone Encounter (Signed)
Pt insurance is active and benefits verified through Medicare a/b Co-pay 0, DED $240/0 met, out of pocket 0/0 met, co-insurance 20%. no pre-authorization required. Passport, 12/17/2022@1 :50pm, REF# [id number]   2ndary insurance is active and benefits verified through [place]. Co-pay 0, DED $350/0 met, out of pocket $6,000/0 met, co-insurance 0%. No pre-authorization required. Passport, 12/17/2022@1 :54pm, REF# [id number]   How many CR sessions are covered? (72 sessions for ICR)72 Is this a lifetime maximum or an annual maximum? annual Has the member used any of these services to date? no Is there a time limit (weeks/months) on start of program and/or program completion? no

## 2022-12-17 NOTE — Telephone Encounter (Signed)
Called patient to see if he was interested in participating in the Cardiac Rehab Program. Patient stated yes. Patient will come in for orientation on 12/24/22@10am  and will attend the 1:45pm exercise class.   Sent information to [employer]

## 2022-12-19 ENCOUNTER — Telehealth (HOSPITAL_COMMUNITY): Payer: Self-pay

## 2022-12-19 NOTE — Telephone Encounter (Signed)
Called pt to confirm appt for 12/24/22 @ 1000. No answer, LM on VM.   Colbert Ewing, MS 12/19/2022 5:05 PM

## 2022-12-24 ENCOUNTER — Encounter (HOSPITAL_COMMUNITY)
Admission: RE | Admit: 2022-12-24 | Discharge: 2022-12-24 | Disposition: A | Discharge status: Home / Self Care | Payer: Medicare Other | Source: Ambulatory Visit | Attending: Cardiovascular Disease | Admitting: Cardiovascular Disease

## 2022-12-24 VITALS — BP 116/64 | Ht 68.0 in | Wt 152.1 lb

## 2022-12-24 DIAGNOSIS — I5022 Chronic systolic (congestive) heart failure: Secondary | ICD-10-CM | POA: Diagnosis not present

## 2022-12-24 NOTE — Progress Notes (Signed)
Cardiac Rehab Medication Review   Does the patient  feel that his/her medications are working for him/her?  YES   Has the patient been experiencing any side effects to the medications prescribed?   NO  Does the patient measure his/her own blood pressure or blood glucose at home?  YES    Does the patient have any problems obtaining medications due to transportation or finances?  NO  Understanding of regimen: good Understanding of indications: good Potential of compliance: good    Comments: Treshun has a good understanding of his medications and why he is taking them. He checks his BP everyday at home in the morning. Awesome also checks his weight in the mornings and knows what to do when he has gained.     Colbert Ewing, MS 12/24/2022 3:05 PM

## 2022-12-24 NOTE — Progress Notes (Signed)
Pt currently in Cardiac Rehab orientation. Mel Almond, EP notified RN that patient may have converted to Afib from NSR. RN assessed patient, pt denied SOB, fluttering, syncope. Pt on anticoag and beta blocker. Obtained EKG strips and consulted Diona Browner, DNP onsite. Ordered pt a 12 lead. 12 lead obtained, see EKG documentation. Pt's VSS, cleared to continue his 6 min walk test.

## 2022-12-24 NOTE — Progress Notes (Signed)
Cardiac Individual Treatment Plan  Patient Details  Name: Douglas Matthews MRN: [id number] Date of Birth: 02-10-1935 Referring Provider:   Flowsheet Row CARDIAC REHAB PHASE II ORIENTATION from 12/24/2022 in [facility] [place]  Referring Provider [name], MD       Initial Encounter Date:  [place] PHASE II ORIENTATION from 12/24/2022 in [facility] [place]  Date 12/24/22       Visit Diagnosis: Heart failure, chronic systolic ([place]) - Plan: EKG 12-Lead, EKG 12-Lead  Patient's Home Medications on Admission:  Current Outpatient Medications:    apixaban (ELIQUIS) 2.5 MG TABS tablet, Take 1 tablet (2.5 mg total) by mouth 2 (two) times daily., Disp: 180 tablet, Rfl: 1   carvedilol (COREG) 12.5 MG tablet, Take 1 tablet (12.5 mg total) by mouth 2 (two) times daily with a meal., Disp: 180 tablet, Rfl: 1   COD LIVER OIL PO, Take 1 tablet by mouth daily., Disp: , Rfl:    furosemide (LASIX) 40 MG tablet, Take 1 tablet (40 mg total) by mouth daily as needed for edema or fluid (as needed in case of weight gain 2 to 3 lbs in 24 hrs or 5 lbs in 7 days, leg swelling or shortness of breath.)., Disp: 30 tablet, Rfl: 3   GARLIC PO, Take 1 tablet by mouth daily., Disp: , Rfl:    hydrALAZINE (APRESOLINE) 10 MG tablet, Take 1 tablet (10 mg total) by mouth 3 (three) times daily., Disp: 270 tablet, Rfl: 1   isosorbide mononitrate (IMDUR) 30 MG 24 hr tablet, Take 1 tablet (30 mg total) by mouth daily., Disp: 90 tablet, Rfl: 1   Multiple Vitamin (MULTIVITAMIN) capsule, Take 1 capsule by mouth daily., Disp: , Rfl:    Saw Palmetto 450 MG CAPS, Take 900 mg by mouth daily., Disp: , Rfl:   Past Medical History: Past Medical History:  Diagnosis Date   Anemia    BPH (benign prostatic hypertrophy)    Diverticulosis    Hyperlipidemia    Hypertension     Tobacco Use: Social History    Tobacco Use  Smoking Status Never  Smokeless Tobacco Never    Labs: Review Flowsheet  More data exists      Latest Ref Rng & Units 04/17/2017 04/19/2018 04/29/2019 04/16/2020 05/08/2021  Labs for ITP Cardiac and Pulmonary Rehab  Cholestrol 0 - 200 mg/dL 292  242  307  310  259   LDL (calc) 0 - 99 mg/dL 217  183  228  236  182   HDL-C >39.00 mg/dL 48.70  44.80  57.80  51.40  59.40   Trlycerides 0.0 - 149.0 mg/dL 130.0  70.0  103.0  117.0  87.0     Capillary Blood Glucose: Lab Results  Component Value Date   GLUCAP 133 (H) 10/25/2022   GLUCAP 110 (H) 10/25/2022   GLUCAP 93 10/25/2022     Exercise Target Goals: Exercise Program Goal: Individual exercise prescription set using results from initial 6 min walk test and THRR while considering  patient's activity barriers and safety.   Exercise Prescription Goal: Initial exercise prescription builds to 30-45 minutes a day of aerobic activity, 2-3 days per week.  Home exercise guidelines will be given to patient during program as part of exercise prescription that the participant will acknowledge.  Activity Barriers & Risk Stratification:  Activity Barriers & Cardiac Risk Stratification - 12/24/22 1424  Activity Barriers & Cardiac Risk Stratification   Activity Barriers Balance Concerns;Decreased Ventricular Function;Deconditioning    Cardiac Risk Stratification High   <5 METS on            6 Minute Walk:  6 Minute Walk     Row Name 12/24/22 1420         6 Minute Walk   Phase Initial     Distance 630 feet     Walk Time 6 minutes     # of Rest Breaks 2  break from 1:15-1:50, 3:10-4:30 due to high HR     MPH 1.19     METS 1.5     RPE 11     Perceived Dyspnea  0     VO2 Peak 5.39     Symptoms No     Resting HR 84 bpm     Resting BP 116/64     Resting Oxygen Saturation  100 %     Exercise Oxygen Saturation  during 6 min walk 97 %     Max Ex. HR 129 bpm     Max Ex. BP 138/64     2 Minute Post BP 128/68               Oxygen Initial Assessment:   Oxygen Re-Evaluation:   Oxygen Discharge (Final Oxygen Re-Evaluation):   Initial Exercise Prescription:  Initial Exercise Prescription - 12/24/22 1400       Date of Initial Exercise RX and Referring Provider   Date 12/24/22    Referring Provider Leodis Sias, MD    Expected Discharge Date 02/23/23      NuStep   Level 1    SPM 70    Minutes 25    METs 1.5      Prescription Details   Frequency (times per week) 3    Duration Progress to 30 minutes of continuous aerobic without signs/symptoms of physical distress      Intensity   THRR 40-80% of Max Heartrate 54-108    Ratings of Perceived Exertion 11-13    Perceived Dyspnea 0-4      Progression   Progression Continue to progress workloads to maintain intensity without signs/symptoms of physical distress.      Resistance Training   Training Prescription Yes    Weight 2    Reps 10-15             Perform Capillary Blood Glucose checks as needed.  Exercise Prescription Changes:   Exercise Comments:   Exercise Goals and Review:   Exercise Goals Re-Evaluation :   Discharge Exercise Prescription (Final Exercise Prescription Changes):   Nutrition:  Target Goals: Understanding of nutrition guidelines, daily intake of sodium 1500mg , cholesterol 200mg , calories 30% from fat and 7% or less from saturated fats, daily to have 5 or more servings of fruits and vegetables.  Biometrics:  Pre Biometrics - 12/24/22 1418       Pre Biometrics   Waist Circumference 42 inches    Hip Circumference 39 inches    Waist to Hip Ratio 1.08 %    Triceps Skinfold 10 mm    % Body Fat 25.9 %    Grip Strength 30 kg    Flexibility 10.5 in    Single Leg Stand 3.75 seconds              Nutrition Therapy Plan and Nutrition Goals:   Nutrition Assessments:  MEDIFICTS Score Key: ?70 Need to make dietary changes  40-70  Heart Healthy Diet ? 40 Therapeutic Level  Cholesterol Diet    Picture Your Plate Scores: <96<40 Unhealthy dietary pattern with much room for improvement. 41-50 Dietary pattern unlikely to meet recommendations for good health and room for improvement. 51-60 More healthful dietary pattern, with some room for improvement.  >60 Healthy dietary pattern, although there may be some specific behaviors that could be improved.    Nutrition Goals Re-Evaluation:   Nutrition Goals Re-Evaluation:   Nutrition Goals Discharge (Final Nutrition Goals Re-Evaluation):   Psychosocial: Target Goals: Acknowledge presence or absence of significant depression and/or stress, maximize coping skills, provide positive support system. Participant is able to verbalize types and ability to use techniques and skills needed for reducing stress and depression.  Initial Review & Psychosocial Screening:  Initial Psych Review & Screening - 12/24/22 1430       Initial Review   Current issues with None Identified      Family Dynamics   Good Support System? Yes   Fayrene FearingJames has his wife for support   Comments Fayrene FearingJames states he has no current feelings of depression, anxiety, or stress.      Barriers   Psychosocial barriers to participate in program There are no identifiable barriers or psychosocial needs.      Screening Interventions   Interventions Encouraged to exercise;Provide feedback about the scores to participant    Expected Outcomes Short Term goal: Identification and review with participant of any Quality of Life or Depression concerns found by scoring the questionnaire.;Long Term goal: The participant improves quality of Life and PHQ9 Scores as seen by post scores and/or verbalization of changes             Quality of Life Scores:  Quality of Life - 12/24/22 1432       Quality of Life   Select Quality of Life      Quality of Life Scores   Health/Function Pre 26.4 %    Socioeconomic Pre 28.8 %    Psych/Spiritual Pre 29.14 %    Family Pre 27.6  %    GLOBAL Pre 27.56 %            Scores of 19 and below usually indicate a poorer quality of life in these areas.  A difference of  2-3 points is a clinically meaningful difference.  A difference of 2-3 points in the total score of the Quality of Life Index has been associated with significant improvement in overall quality of life, self-image, physical symptoms, and general health in studies assessing change in quality of life.  PHQ-9: Review Flowsheet  More data exists      12/24/2022 07/08/2022 06/10/2022 12/11/2021 07/02/2021  Depression screen PHQ 2/9  Decreased Interest 0 0 0 0 0  Down, Depressed, Hopeless 0 0 0 0 0  PHQ - 2 Score 0 0 0 0 0  Altered sleeping 0 - - - -  Tired, decreased energy 1 - - - -  Change in appetite 0 - - - -  Feeling bad or failure about yourself  0 - - - -  Trouble concentrating 0 - - - -  Moving slowly or fidgety/restless 0 - - - -  Suicidal thoughts 0 - - - -  PHQ-9 Score 1 - - - -  Difficult doing work/chores Not difficult at all - - - -   Interpretation of Total Score  Total Score Depression Severity:  1-4 = Minimal depression, 5-9 = Mild depression, 10-14 = Moderate depression, 15-19 =  Moderately severe depression, 20-27 = Severe depression   Psychosocial Evaluation and Intervention:   Psychosocial Re-Evaluation:   Psychosocial Discharge (Final Psychosocial Re-Evaluation):   Vocational Rehabilitation: Provide vocational rehab assistance to qualifying candidates.   Vocational Rehab Evaluation & Intervention:  Vocational Rehab - 12/24/22 1449       Initial Vocational Rehab Evaluation & Intervention   Assessment shows need for Vocational Rehabilitation No   [place] is retired and does not need vocational rehab at this time            Education: Education Goals: Education classes will be provided on a weekly basis, covering required topics. Participant will state understanding/return demonstration of topics presented.     Core  Videos: Exercise    Move It!  Clinical staff conducted group or individual video education with verbal and written material and guidebook.  Patient learns the recommended Pritikin exercise program. Exercise with the goal of living a long, healthy life. Some of the health benefits of exercise include controlled diabetes, healthier blood pressure levels, improved cholesterol levels, improved heart and lung capacity, improved sleep, and better body composition. Everyone should speak with their doctor before starting or changing an exercise routine.  Biomechanical Limitations Clinical staff conducted group or individual video education with verbal and written material and guidebook.  Patient learns how biomechanical limitations can impact exercise and how we can mitigate and possibly overcome limitations to have an impactful and balanced exercise routine.  Body Composition Clinical staff conducted group or individual video education with verbal and written material and guidebook.  Patient learns that body composition (ratio of muscle mass to fat mass) is a key component to assessing overall fitness, rather than body weight alone. Increased fat mass, especially visceral belly fat, can put [place] at increased risk for metabolic syndrome, type 2 diabetes, heart disease, and even death. It is recommended to combine diet and exercise (cardiovascular and resistance training) to improve your body composition. Seek guidance from your physician and exercise physiologist before implementing an exercise routine.  Exercise Action Plan Clinical staff conducted group or individual video education with verbal and written material and guidebook.  Patient learns the recommended strategies to achieve and enjoy long-term exercise adherence, including variety, self-motivation, self-efficacy, and positive decision making. Benefits of exercise include fitness, good health, weight management, more energy, better sleep, less  stress, and overall well-being.  Medical   Heart Disease Risk Reduction Clinical staff conducted group or individual video education with verbal and written material and guidebook.  Patient learns our heart is our most vital organ as it circulates oxygen, nutrients, white blood cells, and hormones throughout the entire body, and carries waste away. Data supports a plant-based eating plan like the Pritikin Program for its effectiveness in slowing progression of and reversing heart disease. The video provides a number of recommendations to address heart disease.   Metabolic Syndrome and Belly Fat  Clinical staff conducted group or individual video education with verbal and written material and guidebook.  Patient learns what metabolic syndrome is, how it leads to heart disease, and how one can reverse it and keep it from coming back. You have metabolic syndrome if you have 3 of the following 5 criteria: abdominal obesity, high blood pressure, high triglycerides, low HDL cholesterol, and high blood sugar.  Hypertension and Heart Disease Clinical staff conducted group or individual video education with verbal and written material and guidebook.  Patient learns that high blood pressure, or hypertension, is very common in the [place].  Hypertension is largely due to excessive salt intake, but other important risk factors include being overweight, physical inactivity, drinking too much alcohol, smoking, and not eating enough potassium from fruits and vegetables. High blood pressure is a leading risk factor for heart attack, stroke, congestive heart failure, dementia, kidney failure, and premature death. Long-term effects of excessive salt intake include stiffening of the arteries and thickening of heart muscle and organ damage. Recommendations include ways to reduce hypertension and the risk of heart disease.  Diseases of Our Time - Focusing on Diabetes Clinical staff conducted group or individual  video education with verbal and written material and guidebook.  Patient learns why the best way to stop diseases of our time is prevention, through food and other lifestyle changes. Medicine (such as prescription pills and surgeries) is often only a Band-Aid on the problem, not a long-term solution. Most common diseases of our time include obesity, type 2 diabetes, hypertension, heart disease, and cancer. The Pritikin Program is recommended and has been proven to help reduce, reverse, and/or prevent the damaging effects of metabolic syndrome.  Nutrition   Overview of the Pritikin Eating Plan  Clinical staff conducted group or individual video education with verbal and written material and guidebook.  Patient learns about the Pritikin Eating Plan for disease risk reduction. The Pritikin Eating Plan emphasizes a wide variety of unrefined, minimally-processed carbohydrates, like fruits, vegetables, whole grains, and legumes. Go, Caution, and Stop food choices are explained. Plant-based and lean animal proteins are emphasized. Rationale provided for low sodium intake for blood pressure control, low added sugars for blood sugar stabilization, and low added fats and oils for coronary artery disease risk reduction and weight management.  Calorie Density  Clinical staff conducted group or individual video education with verbal and written material and guidebook.  Patient learns about calorie density and how it impacts the Pritikin Eating Plan. Knowing the characteristics of the food you choose will help you decide whether those foods will lead to weight gain or weight loss, and whether you want to consume more or less of them. Weight loss is usually a side effect of the Pritikin Eating Plan because of its focus on low calorie-dense foods.  Label Reading  Clinical staff conducted group or individual video education with verbal and written material and guidebook.  Patient learns about the Pritikin recommended  label reading guidelines and corresponding recommendations regarding calorie density, added sugars, sodium content, and whole grains.  Dining Out - Part 1  Clinical staff conducted group or individual video education with verbal and written material and guidebook.  Patient learns that restaurant meals can be sabotaging because they can be so high in calories, fat, sodium, and/or sugar. Patient learns recommended strategies on how to positively address this and avoid unhealthy pitfalls.  Facts on Fats  Clinical staff conducted group or individual video education with verbal and written material and guidebook.  Patient learns that lifestyle modifications can be just as effective, if not more so, as many medications for lowering your risk of heart disease. A Pritikin lifestyle can help to reduce your risk of inflammation and atherosclerosis (cholesterol build-up, or plaque, in the artery walls). Lifestyle interventions such as dietary choices and physical activity address the cause of atherosclerosis. A review of the types of fats and their impact on blood cholesterol levels, along with dietary recommendations to reduce fat intake is also included.  Nutrition Action Plan  Clinical staff conducted group or individual video education with verbal and written material  and guidebook.  Patient learns how to incorporate Pritikin recommendations into their lifestyle. Recommendations include planning and keeping personal health goals in mind as an important part of their success.  Healthy Mind-Set    Healthy Minds, Bodies, Hearts  Clinical staff conducted group or individual video education with verbal and written material and guidebook.  Patient learns how to identify when they are stressed. Video will discuss the impact of that stress, as well as the many benefits of stress management. Patient will also be introduced to stress management techniques. The way we think, act, and feel has an impact on our  hearts.  How Our Thoughts Can Heal Our Hearts  Clinical staff conducted group or individual video education with verbal and written material and guidebook.  Patient learns that negative thoughts can cause depression and anxiety. This can result in negative lifestyle behavior and serious health problems. Cognitive behavioral therapy is an effective method to help control our thoughts in order to change and improve our emotional outlook.  Additional Videos:  Exercise    Improving Performance  Clinical staff conducted group or individual video education with verbal and written material and guidebook.  Patient learns to use a non-linear approach by alternating intensity levels and lengths of time spent exercising to help burn more calories and lose more body fat. Cardiovascular exercise helps improve heart health, metabolism, hormonal balance, blood sugar control, and recovery from fatigue. Resistance training improves strength, endurance, balance, coordination, reaction time, metabolism, and muscle mass. Flexibility exercise improves circulation, posture, and balance. Seek guidance from your physician and exercise physiologist before implementing an exercise routine and learn your capabilities and proper form for all exercise.  Introduction to Yoga  Clinical staff conducted group or individual video education with verbal and written material and guidebook.  Patient learns about yoga, a discipline of the coming together of mind, breath, and body. The benefits of yoga include improved flexibility, improved range of motion, better posture and core strength, increased lung function, weight loss, and positive self-image. Yoga's heart health benefits include lowered blood pressure, healthier heart rate, decreased cholesterol and triglyceride levels, improved immune function, and reduced stress. Seek guidance from your physician and exercise physiologist before implementing an exercise routine and learn your  capabilities and proper form for all exercise.  Medical   Aging: Enhancing Your Quality of Life  Clinical staff conducted group or individual video education with verbal and written material and guidebook.  Patient learns key strategies and recommendations to stay in good physical health and enhance quality of life, such as prevention strategies, having an advocate, securing a Health Care Proxy and Power of Attorney, and keeping a list of medications and system for tracking them. It also discusses how to avoid risk for bone loss.  Biology of Weight Control  Clinical staff conducted group or individual video education with verbal and written material and guidebook.  Patient learns that weight gain occurs because we consume more calories than we burn (eating more, moving less). Even if your body weight is normal, you may have higher ratios of fat compared to muscle mass. Too much body fat puts you at increased risk for cardiovascular disease, heart attack, stroke, type 2 diabetes, and obesity-related cancers. In addition to exercise, following the Pritikin Eating Plan can help reduce your risk.  Decoding Lab Results  Clinical staff conducted group or individual video education with verbal and written material and guidebook.  Patient learns that lab test reflects one measurement whose values change over time  and are influenced by many factors, including medication, stress, sleep, exercise, food, hydration, pre-existing medical conditions, and more. It is recommended to use the knowledge from this video to become more involved with your lab results and evaluate your numbers to speak with your doctor.   Diseases of Our Time - Overview  Clinical staff conducted group or individual video education with verbal and written material and guidebook.  Patient learns that according to the CDC, 50% to 70% of chronic diseases (such as obesity, type 2 diabetes, elevated lipids, hypertension, and heart disease) are  avoidable through lifestyle improvements including healthier food choices, listening to satiety cues, and increased physical activity.  Sleep Disorders Clinical staff conducted group or individual video education with verbal and written material and guidebook.  Patient learns how good quality and duration of sleep are important to overall health and well-being. Patient also learns about sleep disorders and how they impact health along with recommendations to address them, including discussing with a physician.  Nutrition  Dining Out - Part 2 Clinical staff conducted group or individual video education with verbal and written material and guidebook.  Patient learns how to plan ahead and communicate in order to maximize their dining experience in a healthy and nutritious manner. Included are recommended food choices based on the type of restaurant the patient is visiting.   Fueling a Banker conducted group or individual video education with verbal and written material and guidebook.  There is a strong connection between our food choices and our health. Diseases like obesity and type 2 diabetes are very prevalent and are in large-part due to lifestyle choices. The Pritikin Eating Plan provides plenty of food and hunger-curbing satisfaction. It is easy to follow, affordable, and helps reduce health risks.  Menu Workshop  Clinical staff conducted group or individual video education with verbal and written material and guidebook.  Patient learns that restaurant meals can sabotage health goals because they are often packed with calories, fat, sodium, and sugar. Recommendations include strategies to plan ahead and to communicate with the manager, chef, or server to help order a healthier meal.  Planning Your Eating Strategy  Clinical staff conducted group or individual video education with verbal and written material and guidebook.  Patient learns about the Pritikin Eating Plan and  its benefit of reducing the risk of disease. The Pritikin Eating Plan does not focus on calories. Instead, it emphasizes high-quality, nutrient-rich foods. By knowing the characteristics of the foods, we choose, we can determine their calorie density and make informed decisions.  Targeting Your Nutrition Priorities  Clinical staff conducted group or individual video education with verbal and written material and guidebook.  Patient learns that lifestyle habits have a tremendous impact on disease risk and progression. This video provides eating and physical activity recommendations based on your personal health goals, such as reducing LDL cholesterol, losing weight, preventing or controlling type 2 diabetes, and reducing high blood pressure.  Vitamins and Minerals  Clinical staff conducted group or individual video education with verbal and written material and guidebook.  Patient learns different ways to obtain key vitamins and minerals, including through a recommended healthy diet. It is important to discuss all supplements you take with your doctor.   Healthy Mind-Set    Smoking Cessation  Clinical staff conducted group or individual video education with verbal and written material and guidebook.  Patient learns that cigarette smoking and tobacco addiction pose a serious health risk which affects millions of people.  Stopping smoking will significantly reduce the risk of heart disease, lung disease, and many forms of cancer. Recommended strategies for quitting are covered, including working with your doctor to develop a successful plan.  Culinary   Becoming a Set designer conducted group or individual video education with verbal and written material and guidebook.  Patient learns that cooking at home can be healthy, cost-effective, quick, and puts them in control. Keys to cooking healthy recipes will include looking at your recipe, assessing your equipment needs, planning ahead,  making it simple, choosing cost-effective seasonal ingredients, and limiting the use of added fats, salts, and sugars.  Cooking - Breakfast and Snacks  Clinical staff conducted group or individual video education with verbal and written material and guidebook.  Patient learns how important breakfast is to satiety and nutrition through the entire day. Recommendations include key foods to eat during breakfast to help stabilize blood sugar levels and to prevent overeating at meals later in the day. Planning ahead is also a key component.  Cooking - Educational psychologist conducted group or individual video education with verbal and written material and guidebook.  Patient learns eating strategies to improve overall health, including an approach to cook more at home. Recommendations include thinking of animal protein as a side on your plate rather than center stage and focusing instead on lower calorie dense options like vegetables, fruits, whole grains, and plant-based proteins, such as beans. Making sauces in large quantities to freeze for later and leaving the skin on your vegetables are also recommended to maximize your experience.  Cooking - Healthy Salads and Dressing Clinical staff conducted group or individual video education with verbal and written material and guidebook.  Patient learns that vegetables, fruits, whole grains, and legumes are the foundations of the Pritikin Eating Plan. Recommendations include how to incorporate each of these in flavorful and healthy salads, and how to create homemade salad dressings. Proper handling of ingredients is also covered. Cooking - Soups and [employer] - Soups and Desserts Clinical staff conducted group or individual video education with verbal and written material and guidebook.  Patient learns that Pritikin soups and desserts make for easy, nutritious, and delicious snacks and meal components that are low in sodium, fat, sugar, and  calorie density, while high in vitamins, minerals, and filling fiber. Recommendations include simple and healthy ideas for soups and desserts.   Overview     The Pritikin Solution Program Overview Clinical staff conducted group or individual video education with verbal and written material and guidebook.  Patient learns that the results of the Pritikin Program have been documented in more than 100 articles published in peer-reviewed journals, and the benefits include reducing risk factors for (and, in some cases, even reversing) high cholesterol, high blood pressure, type 2 diabetes, obesity, and more! An overview of the three key pillars of the Pritikin Program will be covered: eating well, doing regular exercise, and having a healthy mind-set.  WORKSHOPS  Exercise: Exercise Basics: Building Your Action Plan Clinical staff led group instruction and group discussion with PowerPoint presentation and patient guidebook. To enhance the learning environment the use of posters, models and videos may be added. At the conclusion of this workshop, patients will comprehend the difference between physical activity and exercise, as well as the benefits of incorporating both, into their routine. Patients will understand the FITT (Frequency, Intensity, Time, and Type) principle and how to use it to build an exercise action plan.  In addition, safety concerns and other considerations for exercise and cardiac rehab will be addressed by the presenter. The purpose of this lesson is to promote a comprehensive and effective weekly exercise routine in order to improve patients' overall level of fitness.   Managing Heart Disease: Your Path to a Healthier Heart Clinical staff led group instruction and group discussion with PowerPoint presentation and patient guidebook. To enhance the learning environment the use of posters, models and videos may be added.At the conclusion of this workshop, patients will understand the  anatomy and physiology of the heart. Additionally, they will understand how Pritikin's three pillars impact the risk factors, the progression, and the management of heart disease.  The purpose of this lesson is to provide a high-level overview of the heart, heart disease, and how the Pritikin lifestyle positively impacts risk factors.  Exercise Biomechanics Clinical staff led group instruction and group discussion with PowerPoint presentation and patient guidebook. To enhance the learning environment the use of posters, models and videos may be added. Patients will learn how the structural parts of their bodies function and how these functions impact their daily activities, movement, and exercise. Patients will learn how to promote a neutral spine, learn how to manage pain, and identify ways to improve their physical movement in order to promote healthy living. The purpose of this lesson is to expose patients to common physical limitations that impact physical activity. Participants will learn practical ways to adapt and manage aches and pains, and to minimize their effect on regular exercise. Patients will learn how to maintain good posture while sitting, walking, and lifting.  Balance Training and Fall Prevention  Clinical staff led group instruction and group discussion with PowerPoint presentation and patient guidebook. To enhance the learning environment the use of posters, models and videos may be added. At the conclusion of this workshop, patients will understand the importance of their sensorimotor skills (vision, proprioception, and the vestibular system) in maintaining their ability to balance as they age. Patients will apply a variety of balancing exercises that are appropriate for their current level of function. Patients will understand the common causes for poor balance, possible solutions to these problems, and ways to modify their physical environment in order to minimize their  fall risk. The purpose of this lesson is to teach patients about the importance of maintaining balance as they age and ways to minimize their risk of falling.  WORKSHOPS   Nutrition:  Fueling a Ship broker led group instruction and group discussion with PowerPoint presentation and patient guidebook. To enhance the learning environment the use of posters, models and videos may be added. Patients will review the foundational principles of the Pritikin Eating Plan and understand what constitutes a serving size in each of the food groups. Patients will also learn Pritikin-friendly foods that are better choices when away from home and review make-ahead meal and snack options. Calorie density will be reviewed and applied to three nutrition priorities: weight maintenance, weight loss, and weight gain. The purpose of this lesson is to reinforce (in a group setting) the key concepts around what patients are recommended to eat and how to apply these guidelines when away from home by planning and selecting Pritikin-friendly options. Patients will understand how calorie density may be adjusted for different weight management goals.  Mindful Eating  Clinical staff led group instruction and group discussion with PowerPoint presentation and patient guidebook. To enhance the learning environment the use of posters, models and videos may be  added. Patients will briefly review the concepts of the Pritikin Eating Plan and the importance of low-calorie dense foods. The concept of mindful eating will be introduced as well as the importance of paying attention to internal hunger signals. Triggers for non-hunger eating and techniques for dealing with triggers will be explored. The purpose of this lesson is to provide patients with the opportunity to review the basic principles of the Pritikin Eating Plan, discuss the value of eating mindfully and how to measure internal cues of hunger and fullness using the  Hunger Scale. Patients will also discuss reasons for non-hunger eating and learn strategies to use for controlling emotional eating.  Targeting Your Nutrition Priorities Clinical staff led group instruction and group discussion with PowerPoint presentation and patient guidebook. To enhance the learning environment the use of posters, models and videos may be added. Patients will learn how to determine their genetic susceptibility to disease by reviewing their family history. Patients will gain insight into the importance of diet as part of an overall healthy lifestyle in mitigating the impact of genetics and other environmental insults. The purpose of this lesson is to provide patients with the opportunity to assess their personal nutrition priorities by looking at their family history, their own health history and current risk factors. Patients will also be able to discuss ways of prioritizing and modifying the Pritikin Eating Plan for their highest risk areas  Menu  Clinical staff led group instruction and group discussion with PowerPoint presentation and patient guidebook. To enhance the learning environment the use of posters, models and videos may be added. Using menus brought in from [employer] restaurants, or printed from [employer] menus, patients will apply the Pritikin dining out guidelines that were presented in the [employer] Dining Out educational video. Patients will also be able to practice these guidelines in a variety of provided scenarios. The purpose of this lesson is to provide patients with the opportunity to practice hands-on learning of the Pritikin Dining Out guidelines with actual menus and practice scenarios.  Label Reading Clinical staff led group instruction and group discussion with PowerPoint presentation and patient guidebook. To enhance the learning environment the use of posters, models and videos may be added. Patients will review and discuss the Pritikin label reading guidelines  presented in Pritikin's Label Reading Educational series video. Using fool labels brought in from local grocery stores and markets, patients will apply the label reading guidelines and determine if the packaged food meet the Pritikin guidelines. The purpose of this lesson is to provide patients with the opportunity to review, discuss, and practice hands-on learning of the Pritikin Label Reading guidelines with actual packaged food labels. Cooking School  Pritikin's [employer] Workshops are designed to teach patients ways to prepare quick, simple, and affordable recipes at home. The importance of nutrition's role in chronic disease risk reduction is reflected in its emphasis in the overall Pritikin program. By learning how to prepare essential core Pritikin Eating Plan recipes, patients will increase control over what they eat; be able to customize the flavor of foods without the use of added salt, sugar, or fat; and improve the quality of the food they consume. By learning a set of core recipes which are easily assembled, quickly prepared, and affordable, patients are more likely to prepare more healthy foods at home. These workshops focus on convenient breakfasts, simple entres, side dishes, and desserts which can be prepared with minimal effort and are consistent with nutrition recommendations for cardiovascular risk reduction. Cooking [employer] Workshops  are taught by a chef or registered dietitian (RD) who has been trained by the [employer] team. The chef or RD has a clear understanding of the importance of minimizing - if not completely eliminating - added fat, sugar, and sodium in recipes. Throughout the series of [place] Workshop sessions, patients will learn about healthy ingredients and efficient methods of cooking to build confidence in their capability to prepare    Cooking School weekly topics:  Adding Flavor- Sodium-Free  Fast and Healthy Breakfasts  Powerhouse Plant-Based  Proteins  Satisfying Salads and Dressings  Simple Sides and Sauces  International Cuisine-Spotlight on the [place] Zones  Delicious Desserts  Savory Soups  Efficiency Cooking - Meals in a Snap  Tasty Appetizers and Snacks  Comforting Weekend Breakfasts  One-Pot Wonders   Fast Evening Meals  Easy [place] (Psychosocial): New Thoughts, New Behaviors Clinical staff led group instruction and group discussion with PowerPoint presentation and patient guidebook. To enhance the learning environment the use of posters, models and videos may be added. Patients will learn and practice techniques for developing effective health and lifestyle goals. Patients will be able to effectively apply the goal setting process learned to develop at least one new personal goal.  The purpose of this lesson is to expose patients to a new skill set of behavior modification techniques such as techniques setting SMART goals, overcoming barriers, and achieving new thoughts and new behaviors.  Managing Moods and Relationships Clinical staff led group instruction and group discussion with PowerPoint presentation and patient guidebook. To enhance the learning environment the use of posters, models and videos may be added. Patients will learn how emotional and chronic stress factors can impact their health and relationships. They will learn healthy ways to manage their moods and utilize positive coping mechanisms. In addition, ICR patients will learn ways to improve communication skills. The purpose of this lesson is to expose patients to ways of understanding how one's mood and health are intimately connected. Developing a healthy outlook can help build positive relationships and connections with others. Patients will understand the importance of utilizing effective communication skills that include actively listening and being heard. They will learn and  understand the importance of the "4 Cs" and especially Connections in fostering of a Healthy Mind-Set.  Healthy Sleep for a Healthy Heart Clinical staff led group instruction and group discussion with PowerPoint presentation and patient guidebook. To enhance the learning environment the use of posters, models and videos may be added. At the conclusion of this workshop, patients will be able to demonstrate knowledge of the importance of sleep to overall health, well-being, and quality of life. They will understand the symptoms of, and treatments for, common sleep disorders. Patients will also be able to identify daytime and nighttime behaviors which impact sleep, and they will be able to apply these tools to help manage sleep-related challenges. The purpose of this lesson is to provide patients with a general overview of sleep and outline the importance of quality sleep. Patients will learn about a few of the most common sleep disorders. Patients will also be introduced to the concept of "sleep hygiene," and discover ways to self-manage certain sleeping problems through simple daily behavior changes. Finally, the workshop will motivate patients by clarifying the links between quality sleep and their goals of heart-healthy living.   Recognizing and Reducing Stress Clinical staff led group instruction and group discussion with PowerPoint presentation and patient  guidebook. To enhance the learning environment the use of posters, models and videos may be added. At the conclusion of this workshop, patients will be able to understand the types of stress reactions, differentiate between acute and chronic stress, and recognize the impact that chronic stress has on their health. They will also be able to apply different coping mechanisms, such as reframing negative self-talk. Patients will have the opportunity to practice a variety of stress management techniques, such as deep abdominal breathing, progressive muscle  relaxation, and/or guided imagery.  The purpose of this lesson is to educate patients on the role of stress in their lives and to provide healthy techniques for coping with it.  Learning Barriers/Preferences:  Learning Barriers/Preferences - 12/24/22 1435       Learning Barriers/Preferences   Learning Barriers Sight   wears glasses   Learning Preferences Audio;Computer/Internet;Group Instruction;Individual Instruction;Skilled Demonstration;Pictoral;Verbal Instruction;Video;Written Material             Education Topics:  Knowledge Questionnaire Score:  Knowledge Questionnaire Score - 12/24/22 1448       Knowledge Questionnaire Score   Pre Score 21/24             Core Components/Risk Factors/Patient Goals at Admission:  Personal Goals and Risk Factors at Admission - 12/24/22 1449       Core Components/Risk Factors/Patient Goals on Admission   Heart Failure Yes    Intervention Provide a combined exercise and nutrition program that is supplemented with education, support and counseling about heart failure. Directed toward relieving symptoms such as shortness of breath, decreased exercise tolerance, and extremity edema.    Expected Outcomes Improve functional capacity of life;Short term: Attendance in program 2-3 days a week with increased exercise capacity. Reported lower sodium intake. Reported increased fruit and vegetable intake. Reports medication compliance.;Short term: Daily weights obtained and reported for increase. Utilizing diuretic protocols set by physician.;Long term: Adoption of self-care skills and reduction of barriers for early signs and symptoms recognition and intervention leading to self-care maintenance.    Hypertension Yes    Intervention Monitor prescription use compliance.;Provide education on lifestyle modifcations including regular physical activity/exercise, weight management, moderate sodium restriction and increased consumption of fresh fruit,  vegetables, and low fat dairy, alcohol moderation, and smoking cessation.    Expected Outcomes Long Term: Maintenance of blood pressure at goal levels.;Short Term: Continued assessment and intervention until BP is < 140/27mm HG in hypertensive participants. < 130/70mm HG in hypertensive participants with diabetes, heart failure or chronic kidney disease.    Lipids Yes    Intervention Provide education and support for participant on nutrition & aerobic/resistive exercise along with prescribed medications to achieve LDL 70mg , HDL >40mg .    Expected Outcomes Short Term: Participant states understanding of desired cholesterol values and is compliant with medications prescribed. Participant is following exercise prescription and nutrition guidelines.;Long Term: Cholesterol controlled with medications as prescribed, with individualized exercise RX and with personalized nutrition plan. Value goals: LDL < 70mg , HDL > 40 mg.             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 Introduction to pritikin education/ intensive cardiac rehab. Initital orientation packet reviewed with patient.  Comments: Participant attended orientation for the cardiac rehabilitation program on  12/24/2022  to perform initial intake and exercise walk test. Patient introduced to the Pritikin Program education and orientation packet was reviewed. Completed 6-minute walk test, measurements, initial ITP, and exercise prescription. Vital signs stable. Telemetry-normal sinus rhythm with PACs, questionable Afib. During , at minute 1:17, pt noted to have HR increase to 120-130's. Walk test aborted, pt seated and VSS. RN Corrie Dandy made aware, see note from today's visit for further details. Pt cleared to restart  after 12-lead EKG obtained. Pt required 2 rest breaks due to HR increase above target during walk test, VSS and pt asymptomatic.   Service time was from 1000 to 1245.      Jonna Coup, MS 12/24/2022 2:59 PM

## 2022-12-30 ENCOUNTER — Telehealth: Payer: Self-pay | Admitting: Cardiovascular Disease

## 2022-12-30 MED ORDER — METOPROLOL SUCCINATE ER 25 MG PO TB24: 75.0000 mg | ORAL_TABLET | Freq: Every day | ORAL | 3 refills | Status: DC

## 2022-12-30 NOTE — Telephone Encounter (Signed)
Called and spoke with patient who verbalized understanding to stop Coreg and start Toprol XL. He states he will pick this up from pharmacy today so that he can take it prior to his cardiac rehab tomorrow. Appt scheduled for 01/20/23 for closer follow-up. No further questions.

## 2022-12-30 NOTE — Telephone Encounter (Signed)
-----  Message from Thayer Headings, MD sent at 12/29/2022  5:35 PM EST ----- Lets DC his Coreg and Start Toprol XL 75 mg ( 3 - 25 mg tabs a day) This should help keep his HR controlled  I would let him exercise as long as he is feeling well and HR is < 150 with exertion   We can see him back in the office is several weeks to follow up with this med change.  thanks  PN   ----- Message ----- From: Magda Kiel, RN Sent: 12/25/2022   8:51 AM EST To: Thayer Headings, MD; Molli Barrows  Dr Cathie Olden,  Mr Sek was noted to have an irregular heart rate yesterday. Diona Browner NP ordered 12 lead. Please review. We proceeded with his walk test. We had to stop the patient 3 times during the walk test as his heart went up to the 130's with activity. Patient is supposed to start exercise next week. Are you okay with the patient proceeding with exercise? What is an acceptable upper limit / target heart rate for exercise?  Thanks for your assistance, [place]

## 2022-12-31 ENCOUNTER — Encounter (HOSPITAL_COMMUNITY)
Admission: RE | Admit: 2022-12-31 | Discharge: 2022-12-31 | Disposition: A | Discharge status: Home / Self Care | Payer: Medicare Other | Source: Ambulatory Visit | Attending: Cardiovascular Disease | Admitting: Cardiovascular Disease

## 2022-12-31 DIAGNOSIS — I5022 Chronic systolic (congestive) heart failure: Secondary | ICD-10-CM | POA: Diagnosis not present

## 2022-12-31 NOTE — Progress Notes (Signed)
Cardiac Individual Treatment Plan  Patient Details  Name: Douglas Matthews MRN: [id number] Date of Birth: 1935/05/30 Referring Provider:   Flowsheet Row CARDIAC REHAB PHASE II ORIENTATION [facility]24[facility]20[facility]  Referring Provider Leodis SiasPhillip Nahser, MD       Initial Encounter Date:  Flowsheet Row CARDIAC REHAB PHASE II ORIENTATION [facility]  Date 12/24/22       Visit Diagnosis: Heart Failure Chronic Systolic  Patient's Home Medications on Admission:  Current Outpatient Medications:    apixaban (ELIQUIS) 2.5 MG TABS tablet, Take 1 tablet (2.5 mg total) by mouth 2 (two) times daily., Disp: 180 tablet, Rfl: 1   COD LIVER OIL PO, Take 1 tablet by mouth daily., Disp: , Rfl:    furosemide (LASIX) 40 MG tablet, Take 1 tablet (40 mg total) by mouth daily as needed for edema or fluid (as needed in case of weight gain 2 to 3 lbs in 24 hrs or 5 lbs in 7 days, leg swelling or shortness of breath.)., Disp: 30 tablet, Rfl: 3   GARLIC PO, Take 1 tablet by mouth daily., Disp: , Rfl:    hydrALAZINE (APRESOLINE) 10 MG tablet, Take 1 tablet (10 mg total) by mouth 3 (three) times daily., Disp: 270 tablet, Rfl: 1   isosorbide mononitrate (IMDUR) 30 MG 24 hr tablet, Take 1 tablet (30 mg total) by mouth daily., Disp: 90 tablet, Rfl: 1   metoprolol succinate (TOPROL XL) 25 MG 24 hr tablet, Take 3 tablets (75 mg total) by mouth daily., Disp: 270 tablet, Rfl: 3   Multiple Vitamin (MULTIVITAMIN) capsule, Take 1 capsule by mouth daily., Disp: , Rfl:    Saw Palmetto 450 MG CAPS, Take 900 mg by mouth daily., Disp: , Rfl:   Past Medical History: Past Medical History:  Diagnosis Date   Anemia    BPH (benign prostatic hypertrophy)    Diverticulosis    Hyperlipidemia    Hypertension     Tobacco Use: Social History   Tobacco Use  Smoking Status Never  Smokeless  Tobacco Never    Labs: Review Flowsheet  More data exists      Latest Ref Rng & Units 04/17/2017 04/19/2018 04/29/2019 04/16/2020 05/08/2021  Labs for ITP Cardiac and Pulmonary Rehab  Cholestrol 0 - 200 mg/dL [id number]  [id number]  696307  295310  [id number]   LDL (calc) 0 - 99 mg/dL 132217  440183  102228  725236  366182   HDL-C >39.00 mg/dL [id number]  47.4244.80  59.5657.80  38.7551.40  59.40   Trlycerides 0.0 - 149.0 mg/dL [phone]  29.570.0  188.4103.0  166.0117.0  87.0     Capillary Blood Glucose: Lab Results  Component Value Date   GLUCAP 133 (H) 10/25/2022   GLUCAP 110 (H) 10/25/2022   GLUCAP 93 10/25/2022     Exercise Target Goals: Exercise Program Goal: Individual exercise prescription set using results from initial 6 min walk test and THRR while considering  patient's activity barriers and safety.   Exercise Prescription Goal: Initial exercise prescription builds to 30-45 minutes a day of aerobic activity, 2-3 days per week.  Home exercise guidelines will be given to patient during program as part of exercise prescription that the participant will acknowledge.  Activity Barriers & Risk Stratification:  Activity Barriers & Cardiac Risk Stratification - 12/24/22 1424       Activity Barriers & Cardiac Risk Stratification  Activity Barriers Balance Concerns;Decreased Ventricular Function;Deconditioning    Cardiac Risk Stratification High   <5 METS on            6 Minute Walk:  6 Minute Walk     Row Name 12/24/22 1420         6 Minute Walk   Phase Initial     Distance 630 feet     Walk Time 6 minutes     # of Rest Breaks 2  break from 1:15-1:50, 3:10-4:30 due to high HR     MPH 1.19     METS 1.5     RPE 11     Perceived Dyspnea  0     VO2 Peak 5.39     Symptoms No     Resting HR 84 bpm     Resting BP 116/64     Resting Oxygen Saturation  100 %     Exercise Oxygen Saturation  during 6 min walk 97 %     Max Ex. HR 129 bpm     Max Ex. BP 138/64     2 Minute Post BP 128/68              Oxygen Initial  Assessment:   Oxygen Re-Evaluation:   Oxygen Discharge (Final Oxygen Re-Evaluation):   Initial Exercise Prescription:  Initial Exercise Prescription - 12/24/22 1400       Date of Initial Exercise RX and Referring Provider   Date 12/24/22    Referring Provider Leodis Sias, MD    Expected Discharge Date 02/23/23      NuStep   Level 1    SPM 70    Minutes 25    METs 1.5      Prescription Details   Frequency (times per week) 3    Duration Progress to 30 minutes of continuous aerobic without signs/symptoms of physical distress      Intensity   THRR 40-80% of Max Heartrate 54-108    Ratings of Perceived Exertion 11-13    Perceived Dyspnea 0-4      Progression   Progression Continue to progress workloads to maintain intensity without signs/symptoms of physical distress.      Resistance Training   Training Prescription Yes    Weight 2    Reps 10-15             Perform Capillary Blood Glucose checks as needed.  Exercise Prescription Changes:   Exercise Comments:   Exercise Goals and Review:   Exercise Goals Re-Evaluation :   Discharge Exercise Prescription (Final Exercise Prescription Changes):   Nutrition:  Target Goals: Understanding of nutrition guidelines, daily intake of sodium 1500mg , cholesterol 200mg , calories 30% from fat and 7% or less from saturated fats, daily to have 5 or more servings of fruits and vegetables.  Biometrics:  Pre Biometrics - 12/24/22 1418       Pre Biometrics   Waist Circumference 42 inches    Hip Circumference 39 inches    Waist to Hip Ratio 1.08 %    Triceps Skinfold 10 mm    % Body Fat 25.9 %    Grip Strength 30 kg    Flexibility 10.5 in    Single Leg Stand 3.75 seconds              Nutrition Therapy Plan and Nutrition Goals:   Nutrition Assessments:  MEDIFICTS Score Key: ?70 Need to make dietary changes  40-70 Heart Healthy Diet ? 40 Therapeutic Level Cholesterol  Diet    Picture Your Plate  Scores: <78 Unhealthy dietary pattern with much room for improvement. 41-50 Dietary pattern unlikely to meet recommendations for good health and room for improvement. 51-60 More healthful dietary pattern, with some room for improvement.  >60 Healthy dietary pattern, although there may be some specific behaviors that could be improved.    Nutrition Goals Re-Evaluation:   Nutrition Goals Re-Evaluation:   Nutrition Goals Discharge (Final Nutrition Goals Re-Evaluation):   Psychosocial: Target Goals: Acknowledge presence or absence of significant depression and/or stress, maximize coping skills, provide positive support system. Participant is able to verbalize types and ability to use techniques and skills needed for reducing stress and depression.  Initial Review & Psychosocial Screening:  Initial Psych Review & Screening - 12/24/22 1430       Initial Review   Current issues with None Identified      Family Dynamics   Good Support System? Yes   Chananya has his wife for support   Comments Conlee states he has no current feelings of depression, anxiety, or stress.      Barriers   Psychosocial barriers to participate in program There are no identifiable barriers or psychosocial needs.      Screening Interventions   Interventions Encouraged to exercise;Provide feedback about the scores to participant    Expected Outcomes Short Term goal: Identification and review with participant of any Quality of Life or Depression concerns found by scoring the questionnaire.;Long Term goal: The participant improves quality of Life and PHQ9 Scores as seen by post scores and/or verbalization of changes             Quality of Life Scores:  Quality of Life - 12/24/22 1432       Quality of Life   Select Quality of Life      Quality of Life Scores   Health/Function Pre 26.4 %    Socioeconomic Pre 28.8 %    Psych/Spiritual Pre 29.14 %    Family Pre 27.6 %    GLOBAL Pre 27.56 %             Scores of 19 and below usually indicate a poorer quality of life in these areas.  A difference of  2-3 points is a clinically meaningful difference.  A difference of 2-3 points in the total score of the Quality of Life Index has been associated with significant improvement in overall quality of life, self-image, physical symptoms, and general health in studies assessing change in quality of life.  PHQ-9: Review Flowsheet  More data exists      12/24/2022 07/08/2022 06/10/2022 12/11/2021 07/02/2021  Depression screen PHQ 2/9  Decreased Interest 0 0 0 0 0  Down, Depressed, Hopeless 0 0 0 0 0  PHQ - 2 Score 0 0 0 0 0  Altered sleeping 0 - - - -  Tired, decreased energy 1 - - - -  Change in appetite 0 - - - -  Feeling bad or failure about yourself  0 - - - -  Trouble concentrating 0 - - - -  Moving slowly or fidgety/restless 0 - - - -  Suicidal thoughts 0 - - - -  PHQ-9 Score 1 - - - -  Difficult doing work/chores Not difficult at all - - - -   Interpretation of Total Score  Total Score Depression Severity:  1-4 = Minimal depression, 5-9 = Mild depression, 10-14 = Moderate depression, 15-19 = Moderately severe depression, 20-27 = Severe depression  Psychosocial Evaluation and Intervention:   Psychosocial Re-Evaluation:   Psychosocial Discharge (Final Psychosocial Re-Evaluation):   Vocational Rehabilitation: Provide vocational rehab assistance to qualifying candidates.   Vocational Rehab Evaluation & Intervention:  Vocational Rehab - 12/24/22 1449       Initial Vocational Rehab Evaluation & Intervention   Assessment shows need for Vocational Rehabilitation No   Perrion is retired and does not need vocational rehab at this time            Education: Education Goals: Education classes will be provided on a weekly basis, covering required topics. Participant will state understanding/return demonstration of topics presented.     Core Videos: Exercise    Move It!  Clinical  staff conducted group or individual video education with verbal and written material and guidebook.  Patient learns the recommended Pritikin exercise program. Exercise with the goal of living a long, healthy life. Some of the health benefits of exercise include controlled diabetes, healthier blood pressure levels, improved cholesterol levels, improved heart and lung capacity, improved sleep, and better body composition. Everyone should speak with their doctor before starting or changing an exercise routine.  Biomechanical Limitations Clinical staff conducted group or individual video education with verbal and written material and guidebook.  Patient learns how biomechanical limitations can impact exercise and how we can mitigate and possibly overcome limitations to have an impactful and balanced exercise routine.  Body Composition Clinical staff conducted group or individual video education with verbal and written material and guidebook.  Patient learns that body composition (ratio of muscle mass to fat mass) is a key component to assessing overall fitness, rather than body weight alone. Increased fat mass, especially visceral belly fat, can put [place] at increased risk for metabolic syndrome, type 2 diabetes, heart disease, and even death. It is recommended to combine diet and exercise (cardiovascular and resistance training) to improve your body composition. Seek guidance from your physician and exercise physiologist before implementing an exercise routine.  Exercise Action Plan Clinical staff conducted group or individual video education with verbal and written material and guidebook.  Patient learns the recommended strategies to achieve and enjoy long-term exercise adherence, including variety, self-motivation, self-efficacy, and positive decision making. Benefits of exercise include fitness, good health, weight management, more energy, better sleep, less stress, and overall well-being.  Medical    Heart Disease Risk Reduction Clinical staff conducted group or individual video education with verbal and written material and guidebook.  Patient learns our heart is our most vital organ as it circulates oxygen, nutrients, white blood cells, and hormones throughout the entire body, and carries waste away. Data supports a plant-based eating plan like the Pritikin Program for its effectiveness in slowing progression of and reversing heart disease. The video provides a number of recommendations to address heart disease.   Metabolic Syndrome and Belly Fat  Clinical staff conducted group or individual video education with verbal and written material and guidebook.  Patient learns what metabolic syndrome is, how it leads to heart disease, and how one can reverse it and keep it from coming back. You have metabolic syndrome if you have 3 of the following 5 criteria: abdominal obesity, high blood pressure, high triglycerides, low HDL cholesterol, and high blood sugar.  Hypertension and Heart Disease Clinical staff conducted group or individual video education with verbal and written material and guidebook.  Patient learns that high blood pressure, or hypertension, is very common in the [place]. Hypertension is largely due to excessive salt intake, but  other important risk factors include being overweight, physical inactivity, drinking too much alcohol, smoking, and not eating enough potassium from fruits and vegetables. High blood pressure is a leading risk factor for heart attack, stroke, congestive heart failure, dementia, kidney failure, and premature death. Long-term effects of excessive salt intake include stiffening of the arteries and thickening of heart muscle and organ damage. Recommendations include ways to reduce hypertension and the risk of heart disease.  Diseases of Our Time - Focusing on Diabetes Clinical staff conducted group or individual video education with verbal and written material  and guidebook.  Patient learns why the best way to stop diseases of our time is prevention, through food and other lifestyle changes. Medicine (such as prescription pills and surgeries) is often only a Band-Aid on the problem, not a long-term solution. Most common diseases of our time include obesity, type 2 diabetes, hypertension, heart disease, and cancer. The Pritikin Program is recommended and has been proven to help reduce, reverse, and/or prevent the damaging effects of metabolic syndrome.  Nutrition   Overview of the Pritikin Eating Plan  Clinical staff conducted group or individual video education with verbal and written material and guidebook.  Patient learns about the [place] for disease risk reduction. The [place] emphasizes a wide variety of unrefined, minimally-processed carbohydrates, like fruits, vegetables, whole grains, and legumes. Go, Caution, and Stop food choices are explained. Plant-based and lean animal proteins are emphasized. Rationale provided for low sodium intake for blood pressure control, low added sugars for blood sugar stabilization, and low added fats and oils for coronary artery disease risk reduction and weight management.  Calorie Density  Clinical staff conducted group or individual video education with verbal and written material and guidebook.  Patient learns about calorie density and how it impacts the Pritikin Eating Plan. Knowing the characteristics of the food you choose will help you decide whether those foods will lead to weight gain or weight loss, and whether you want to consume more or less of them. Weight loss is usually a side effect of the Pritikin Eating Plan because of its focus on low calorie-dense foods.  Label Reading  Clinical staff conducted group or individual video education with verbal and written material and guidebook.  Patient learns about the Pritikin recommended label reading guidelines and corresponding  recommendations regarding calorie density, added sugars, sodium content, and whole grains.  Dining Out - Part 1  Clinical staff conducted group or individual video education with verbal and written material and guidebook.  Patient learns that restaurant meals can be sabotaging because they can be so high in calories, fat, sodium, and/or sugar. Patient learns recommended strategies on how to positively address this and avoid unhealthy pitfalls.  Facts on Fats  Clinical staff conducted group or individual video education with verbal and written material and guidebook.  Patient learns that lifestyle modifications can be just as effective, if not more so, as many medications for lowering your risk of heart disease. A Pritikin lifestyle can help to reduce your risk of inflammation and atherosclerosis (cholesterol build-up, or plaque, in the artery walls). Lifestyle interventions such as dietary choices and physical activity address the cause of atherosclerosis. A review of the types of fats and their impact on blood cholesterol levels, along with dietary recommendations to reduce fat intake is also included.  Nutrition Action Plan  Clinical staff conducted group or individual video education with verbal and written material and guidebook.  Patient learns how to incorporate Pritikin  recommendations into their lifestyle. Recommendations include planning and keeping personal health goals in mind as an important part of their success.  Healthy Mind-Set    Healthy Minds, Bodies, Hearts  Clinical staff conducted group or individual video education with verbal and written material and guidebook.  Patient learns how to identify when they are stressed. Video will discuss the impact of that stress, as well as the many benefits of stress management. Patient will also be introduced to stress management techniques. The way we think, act, and feel has an impact on our hearts.  How Our Thoughts Can Heal Our Hearts   Clinical staff conducted group or individual video education with verbal and written material and guidebook.  Patient learns that negative thoughts can cause depression and anxiety. This can result in negative lifestyle behavior and serious health problems. Cognitive behavioral therapy is an effective method to help control our thoughts in order to change and improve our emotional outlook.  Additional Videos:  Exercise    Improving Performance  Clinical staff conducted group or individual video education with verbal and written material and guidebook.  Patient learns to use a non-linear approach by alternating intensity levels and lengths of time spent exercising to help burn more calories and lose more body fat. Cardiovascular exercise helps improve heart health, metabolism, hormonal balance, blood sugar control, and recovery from fatigue. Resistance training improves strength, endurance, balance, coordination, reaction time, metabolism, and muscle mass. Flexibility exercise improves circulation, posture, and balance. Seek guidance from your physician and exercise physiologist before implementing an exercise routine and learn your capabilities and proper form for all exercise.  Introduction to Yoga  Clinical staff conducted group or individual video education with verbal and written material and guidebook.  Patient learns about yoga, a discipline of the coming together of mind, breath, and body. The benefits of yoga include improved flexibility, improved range of motion, better posture and core strength, increased lung function, weight loss, and positive self-image. Yoga's heart health benefits include lowered blood pressure, healthier heart rate, decreased cholesterol and triglyceride levels, improved immune function, and reduced stress. Seek guidance from your physician and exercise physiologist before implementing an exercise routine and learn your capabilities and proper form for all  exercise.  Medical   Aging: Enhancing Your Quality of Life  Clinical staff conducted group or individual video education with verbal and written material and guidebook.  Patient learns key strategies and recommendations to stay in good physical health and enhance quality of life, such as prevention strategies, having an advocate, securing a Health Care Proxy and Power of Attorney, and keeping a list of medications and system for tracking them. It also discusses how to avoid risk for bone loss.  Biology of Weight Control  Clinical staff conducted group or individual video education with verbal and written material and guidebook.  Patient learns that weight gain occurs because we consume more calories than we burn (eating more, moving less). Even if your body weight is normal, you may have higher ratios of fat compared to muscle mass. Too much body fat puts you at increased risk for cardiovascular disease, heart attack, stroke, type 2 diabetes, and obesity-related cancers. In addition to exercise, following the Pritikin Eating Plan can help reduce your risk.  Decoding Lab Results  Clinical staff conducted group or individual video education with verbal and written material and guidebook.  Patient learns that lab test reflects one measurement whose values change over time and are influenced by many factors, including medication, stress,  sleep, exercise, food, hydration, pre-existing medical conditions, and more. It is recommended to use the knowledge from this video to become more involved with your lab results and evaluate your numbers to speak with your doctor.   Diseases of Our Time - Overview  Clinical staff conducted group or individual video education with verbal and written material and guidebook.  Patient learns that according to the CDC, 50% to 70% of chronic diseases (such as obesity, type 2 diabetes, elevated lipids, hypertension, and heart disease) are avoidable through lifestyle  improvements including healthier food choices, listening to satiety cues, and increased physical activity.  Sleep Disorders Clinical staff conducted group or individual video education with verbal and written material and guidebook.  Patient learns how good quality and duration of sleep are important to overall health and well-being. Patient also learns about sleep disorders and how they impact health along with recommendations to address them, including discussing with a physician.  Nutrition  Dining Out - Part 2 Clinical staff conducted group or individual video education with verbal and written material and guidebook.  Patient learns how to plan ahead and communicate in order to maximize their dining experience in a healthy and nutritious manner. Included are recommended food choices based on the type of restaurant the patient is visiting.   Fueling a BankerHealthy Body  Clinical staff conducted group or individual video education with verbal and written material and guidebook.  There is a strong connection between our food choices and our health. Diseases like obesity and type 2 diabetes are very prevalent and are in large-part due to lifestyle choices. The Pritikin Eating Plan provides plenty of food and hunger-curbing satisfaction. It is easy to follow, affordable, and helps reduce health risks.  Menu Workshop  Clinical staff conducted group or individual video education with verbal and written material and guidebook.  Patient learns that restaurant meals can sabotage health goals because they are often packed with calories, fat, sodium, and sugar. Recommendations include strategies to plan ahead and to communicate with the manager, chef, or server to help order a healthier meal.  Planning Your Eating Strategy  Clinical staff conducted group or individual video education with verbal and written material and guidebook.  Patient learns about the Pritikin Eating Plan and its benefit of reducing the  risk of disease. The Pritikin Eating Plan does not focus on calories. Instead, it emphasizes high-quality, nutrient-rich foods. By knowing the characteristics of the foods, we choose, we can determine their calorie density and make informed decisions.  Targeting Your Nutrition Priorities  Clinical staff conducted group or individual video education with verbal and written material and guidebook.  Patient learns that lifestyle habits have a tremendous impact on disease risk and progression. This video provides eating and physical activity recommendations based on your personal health goals, such as reducing LDL cholesterol, losing weight, preventing or controlling type 2 diabetes, and reducing high blood pressure.  Vitamins and Minerals  Clinical staff conducted group or individual video education with verbal and written material and guidebook.  Patient learns different ways to obtain key vitamins and minerals, including through a recommended healthy diet. It is important to discuss all supplements you take with your doctor.   Healthy Mind-Set    Smoking Cessation  Clinical staff conducted group or individual video education with verbal and written material and guidebook.  Patient learns that cigarette smoking and tobacco addiction pose a serious health risk which affects millions of people. Stopping smoking will significantly reduce the risk of heart  disease, lung disease, and many forms of cancer. Recommended strategies for quitting are covered, including working with your doctor to develop a successful plan.  Culinary   Becoming a Set designer conducted group or individual video education with verbal and written material and guidebook.  Patient learns that cooking at home can be healthy, cost-effective, quick, and puts them in control. Keys to cooking healthy recipes will include looking at your recipe, assessing your equipment needs, planning ahead, making it simple, choosing  cost-effective seasonal ingredients, and limiting the use of added fats, salts, and sugars.  Cooking - Breakfast and Snacks  Clinical staff conducted group or individual video education with verbal and written material and guidebook.  Patient learns how important breakfast is to satiety and nutrition through the entire day. Recommendations include key foods to eat during breakfast to help stabilize blood sugar levels and to prevent overeating at meals later in the day. Planning ahead is also a key component.  Cooking - Educational psychologist conducted group or individual video education with verbal and written material and guidebook.  Patient learns eating strategies to improve overall health, including an approach to cook more at home. Recommendations include thinking of animal protein as a side on your plate rather than center stage and focusing instead on lower calorie dense options like vegetables, fruits, whole grains, and plant-based proteins, such as beans. Making sauces in large quantities to freeze for later and leaving the skin on your vegetables are also recommended to maximize your experience.  Cooking - Healthy Salads and Dressing Clinical staff conducted group or individual video education with verbal and written material and guidebook.  Patient learns that vegetables, fruits, whole grains, and legumes are the foundations of the Pritikin Eating Plan. Recommendations include how to incorporate each of these in flavorful and healthy salads, and how to create homemade salad dressings. Proper handling of ingredients is also covered. Cooking - Soups and [employer] - Soups and Desserts Clinical staff conducted group or individual video education with verbal and written material and guidebook.  Patient learns that Pritikin soups and desserts make for easy, nutritious, and delicious snacks and meal components that are low in sodium, fat, sugar, and calorie density, while high in  vitamins, minerals, and filling fiber. Recommendations include simple and healthy ideas for soups and desserts.   Overview     The Pritikin Solution Program Overview Clinical staff conducted group or individual video education with verbal and written material and guidebook.  Patient learns that the results of the Pritikin Program have been documented in more than 100 articles published in peer-reviewed journals, and the benefits include reducing risk factors for (and, in some cases, even reversing) high cholesterol, high blood pressure, type 2 diabetes, obesity, and more! An overview of the three key pillars of the Pritikin Program will be covered: eating well, doing regular exercise, and having a healthy mind-set.  WORKSHOPS  Exercise: Exercise Basics: Building Your Action Plan Clinical staff led group instruction and group discussion with PowerPoint presentation and patient guidebook. To enhance the learning environment the use of posters, models and videos may be added. At the conclusion of this workshop, patients will comprehend the difference between physical activity and exercise, as well as the benefits of incorporating both, into their routine. Patients will understand the FITT (Frequency, Intensity, Time, and Type) principle and how to use it to build an exercise action plan. In addition, safety concerns and other considerations for exercise  and cardiac rehab will be addressed by the presenter. The purpose of this lesson is to promote a comprehensive and effective weekly exercise routine in order to improve patients' overall level of fitness.   Managing Heart Disease: Your Path to a Healthier Heart Clinical staff led group instruction and group discussion with PowerPoint presentation and patient guidebook. To enhance the learning environment the use of posters, models and videos may be added.At the conclusion of this workshop, patients will understand the anatomy and physiology of the  heart. Additionally, they will understand how Pritikin's three pillars impact the risk factors, the progression, and the management of heart disease.  The purpose of this lesson is to provide a high-level overview of the heart, heart disease, and how the Pritikin lifestyle positively impacts risk factors.  Exercise Biomechanics Clinical staff led group instruction and group discussion with PowerPoint presentation and patient guidebook. To enhance the learning environment the use of posters, models and videos may be added. Patients will learn how the structural parts of their bodies function and how these functions impact their daily activities, movement, and exercise. Patients will learn how to promote a neutral spine, learn how to manage pain, and identify ways to improve their physical movement in order to promote healthy living. The purpose of this lesson is to expose patients to common physical limitations that impact physical activity. Participants will learn practical ways to adapt and manage aches and pains, and to minimize their effect on regular exercise. Patients will learn how to maintain good posture while sitting, walking, and lifting.  Balance Training and Fall Prevention  Clinical staff led group instruction and group discussion with PowerPoint presentation and patient guidebook. To enhance the learning environment the use of posters, models and videos may be added. At the conclusion of this workshop, patients will understand the importance of their sensorimotor skills (vision, proprioception, and the vestibular system) in maintaining their ability to balance as they age. Patients will apply a variety of balancing exercises that are appropriate for their current level of function. Patients will understand the common causes for poor balance, possible solutions to these problems, and ways to modify their physical environment in order to minimize their fall risk. The purpose of this  lesson is to teach patients about the importance of maintaining balance as they age and ways to minimize their risk of falling.  WORKSHOPS   Nutrition:  Fueling a Scientist, research (physical sciences) led group instruction and group discussion with PowerPoint presentation and patient guidebook. To enhance the learning environment the use of posters, models and videos may be added. Patients will review the foundational principles of the [employer] and understand what constitutes a serving size in each of the food groups. Patients will also learn Pritikin-friendly foods that are better choices when away from home and review make-ahead meal and snack options. Calorie density will be reviewed and applied to three nutrition priorities: weight maintenance, weight loss, and weight gain. The purpose of this lesson is to reinforce (in a group setting) the key concepts around what patients are recommended to eat and how to apply these guidelines when away from home by planning and selecting Pritikin-friendly options. Patients will understand how calorie density may be adjusted for different weight management goals.  Mindful Eating  Clinical staff led group instruction and group discussion with PowerPoint presentation and patient guidebook. To enhance the learning environment the use of posters, models and videos may be added. Patients will briefly review the concepts of the  Pritikin Eating Plan and the importance of low-calorie dense foods. The concept of mindful eating will be introduced as well as the importance of paying attention to internal hunger signals. Triggers for non-hunger eating and techniques for dealing with triggers will be explored. The purpose of this lesson is to provide patients with the opportunity to review the basic principles of the Pritikin Eating Plan, discuss the value of eating mindfully and how to measure internal cues of hunger and fullness using the Hunger Scale. Patients will also  discuss reasons for non-hunger eating and learn strategies to use for controlling emotional eating.  Targeting Your Nutrition Priorities Clinical staff led group instruction and group discussion with PowerPoint presentation and patient guidebook. To enhance the learning environment the use of posters, models and videos may be added. Patients will learn how to determine their genetic susceptibility to disease by reviewing their family history. Patients will gain insight into the importance of diet as part of an overall healthy lifestyle in mitigating the impact of genetics and other environmental insults. The purpose of this lesson is to provide patients with the opportunity to assess their personal nutrition priorities by looking at their family history, their own health history and current risk factors. Patients will also be able to discuss ways of prioritizing and modifying the Pritikin Eating Plan for their highest risk areas  Menu  Clinical staff led group instruction and group discussion with PowerPoint presentation and patient guidebook. To enhance the learning environment the use of posters, models and videos may be added. Using menus brought in from [employer], or printed from [employer], patients will apply the Pritikin dining out guidelines that were presented in the [employer] video. Patients will also be able to practice these guidelines in a variety of provided scenarios. The purpose of this lesson is to provide patients with the opportunity to practice hands-on learning of the Pritikin Dining Out guidelines with actual menus and practice scenarios.  Label Reading Clinical staff led group instruction and group discussion with PowerPoint presentation and patient guidebook. To enhance the learning environment the use of posters, models and videos may be added. Patients will review and discuss the Pritikin label reading guidelines presented in Pritikin's Label Reading  Educational series video. Using fool labels brought in from local grocery stores and markets, patients will apply the label reading guidelines and determine if the packaged food meet the Pritikin guidelines. The purpose of this lesson is to provide patients with the opportunity to review, discuss, and practice hands-on learning of the Pritikin Label Reading guidelines with actual packaged food labels. Cooking School  Pritikin's [employer] are designed to teach patients ways to prepare quick, simple, and affordable recipes at home. The importance of nutrition's role in chronic disease risk reduction is reflected in its emphasis in the overall Pritikin program. By learning how to prepare essential core Pritikin Eating Plan recipes, patients will increase control over what they eat; be able to customize the flavor of foods without the use of added salt, sugar, or fat; and improve the quality of the food they consume. By learning a set of core recipes which are easily assembled, quickly prepared, and affordable, patients are more likely to prepare more healthy foods at home. These workshops focus on convenient breakfasts, simple entres, side dishes, and desserts which can be prepared with minimal effort and are consistent with nutrition recommendations for cardiovascular risk reduction. Cooking [employer] are taught by a Armed forces logistics/support/administrative officer (RD)  who has been trained by the [employer] team. The chef or RD has a clear understanding of the importance of minimizing - if not completely eliminating - added fat, sugar, and sodium in recipes. Throughout the series of [employer] Workshop sessions, patients will learn about healthy ingredients and efficient methods of cooking to build confidence in their capability to prepare    Cooking School weekly topics:  Adding Flavor- Sodium-Free  Fast and Healthy Breakfasts  Powerhouse Plant-Based Proteins  Satisfying Salads and  Dressings  Simple Sides and Sauces  International Cuisine-Spotlight on the [place] Zones  Delicious Desserts  Savory Soups  Efficiency Cooking - Meals in a Snap  Tasty Appetizers and Snacks  Comforting Weekend Breakfasts  One-Pot Wonders   Fast Evening Meals  Easy [place] (Psychosocial): New Thoughts, New Behaviors Clinical staff led group instruction and group discussion with PowerPoint presentation and patient guidebook. To enhance the learning environment the use of posters, models and videos may be added. Patients will learn and practice techniques for developing effective health and lifestyle goals. Patients will be able to effectively apply the goal setting process learned to develop at least one new personal goal.  The purpose of this lesson is to expose patients to a new skill set of behavior modification techniques such as techniques setting SMART goals, overcoming barriers, and achieving new thoughts and new behaviors.  Managing Moods and Relationships Clinical staff led group instruction and group discussion with PowerPoint presentation and patient guidebook. To enhance the learning environment the use of posters, models and videos may be added. Patients will learn how emotional and chronic stress factors can impact their health and relationships. They will learn healthy ways to manage their moods and utilize positive coping mechanisms. In addition, ICR patients will learn ways to improve communication skills. The purpose of this lesson is to expose patients to ways of understanding how one's mood and health are intimately connected. Developing a healthy outlook can help build positive relationships and connections with others. Patients will understand the importance of utilizing effective communication skills that include actively listening and being heard. They will learn and understand the importance of the "4 Cs"  and especially Connections in fostering of a Healthy Mind-Set.  Healthy Sleep for a Healthy Heart Clinical staff led group instruction and group discussion with PowerPoint presentation and patient guidebook. To enhance the learning environment the use of posters, models and videos may be added. At the conclusion of this workshop, patients will be able to demonstrate knowledge of the importance of sleep to overall health, well-being, and quality of life. They will understand the symptoms of, and treatments for, common sleep disorders. Patients will also be able to identify daytime and nighttime behaviors which impact sleep, and they will be able to apply these tools to help manage sleep-related challenges. The purpose of this lesson is to provide patients with a general overview of sleep and outline the importance of quality sleep. Patients will learn about a few of the most common sleep disorders. Patients will also be introduced to the concept of "sleep hygiene," and discover ways to self-manage certain sleeping problems through simple daily behavior changes. Finally, the workshop will motivate patients by clarifying the links between quality sleep and their goals of heart-healthy living.   Recognizing and Reducing Stress Clinical staff led group instruction and group discussion with PowerPoint presentation and patient guidebook. To enhance the learning environment the use of  posters, models and videos may be added. At the conclusion of this workshop, patients will be able to understand the types of stress reactions, differentiate between acute and chronic stress, and recognize the impact that chronic stress has on their health. They will also be able to apply different coping mechanisms, such as reframing negative self-talk. Patients will have the opportunity to practice a variety of stress management techniques, such as deep abdominal breathing, progressive muscle relaxation, and/or guided imagery.  The  purpose of this lesson is to educate patients on the role of stress in their lives and to provide healthy techniques for coping with it.  Learning Barriers/Preferences:  Learning Barriers/Preferences - 12/24/22 1435       Learning Barriers/Preferences   Learning Barriers Sight   wears glasses   Learning Preferences Audio;Computer/Internet;Group Instruction;Individual Instruction;Skilled Demonstration;Pictoral;Verbal Instruction;Video;Written Material             Education Topics:  Knowledge Questionnaire Score:  Knowledge Questionnaire Score - 12/24/22 1448       Knowledge Questionnaire Score   Pre Score 21/24             Core Components/Risk Factors/Patient Goals at Admission:  Personal Goals and Risk Factors at Admission - 12/24/22 1449       Core Components/Risk Factors/Patient Goals on Admission   Heart Failure Yes    Intervention Provide a combined exercise and nutrition program that is supplemented with education, support and counseling about heart failure. Directed toward relieving symptoms such as shortness of breath, decreased exercise tolerance, and extremity edema.    Expected Outcomes Improve functional capacity of life;Short term: Attendance in program 2-3 days a week with increased exercise capacity. Reported lower sodium intake. Reported increased fruit and vegetable intake. Reports medication compliance.;Short term: Daily weights obtained and reported for increase. Utilizing diuretic protocols set by physician.;Long term: Adoption of self-care skills and reduction of barriers for early signs and symptoms recognition and intervention leading to self-care maintenance.    Hypertension Yes    Intervention Monitor prescription use compliance.;Provide education on lifestyle modifcations including regular physical activity/exercise, weight management, moderate sodium restriction and increased consumption of fresh fruit, vegetables, and low fat dairy, alcohol  moderation, and smoking cessation.    Expected Outcomes Long Term: Maintenance of blood pressure at goal levels.;Short Term: Continued assessment and intervention until BP is < 140/55mm HG in hypertensive participants. < 130/11mm HG in hypertensive participants with diabetes, heart failure or chronic kidney disease.    Lipids Yes    Intervention Provide education and support for participant on nutrition & aerobic/resistive exercise along with prescribed medications to achieve LDL 70mg , HDL >40mg .    Expected Outcomes Short Term: Participant states understanding of desired cholesterol values and is compliant with medications prescribed. Participant is following exercise prescription and nutrition guidelines.;Long Term: Cholesterol controlled with medications as prescribed, with individualized exercise RX and with personalized nutrition plan. Value goals: LDL < 70mg , HDL > 40 mg.             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 Introduction to pritikin education/ intensive cardiac rehab. Initital orientation packet reviewed with patient. 30 Day ITP Review, Tamim is scheduled to start cardiac rehab today 12/31/22  Comments: See ITP comments.Harrell Gave RN BSN

## 2022-12-31 NOTE — Progress Notes (Signed)
Daily Session Note  Patient Details  Name: Douglas Matthews MRN: [id number] Date of Birth: [birth year] Referring Provider:   Flowsheet Row CARDIAC REHAB PHASE II ORIENTATION from 12/24/2022 in [facility] [place]  Referring Provider Douglas Jack, MD       Encounter Date: 12/31/2022  Check In:  Session Check In - 12/31/22 1520       Check-In   Supervising physician immediately available to respond to emergencies [facility] MD immediately available    Physician(s) Christen Bame, NP    Location [facility]    Staff Present Sandy Salaam, MS, Exercise Physiologist;David Lilyan Punt, MS, ACSM-CEP, CCRP, Exercise Physiologist;Olinty Celesta Aver, MS, ACSM-CEP, Exercise Physiologist;Johnny Starleen Blue, MS, Exercise Physiologist;Kellar Westberg, RN, BSN    Virtual Visit No    Medication changes reported     Yes    Comments Coreg was D/C: Toprol XL 75 mg daily added    Fall or balance concerns reported    No    Tobacco Cessation No Change    Warm-up and Cool-down Performed as group-led instruction    Resistance Training Performed No    VAD Patient? No    PAD/SET Patient? No      Pain Assessment   Currently in Pain? No/denies    Pain Score 0-No pain    Multiple Pain Sites No             Capillary Blood Glucose: No results found for this or any previous visit (from the past 24 hour(s)).    Social History   Tobacco Use  Smoking Status Never  Smokeless Tobacco Never    Goals Met:  {CHL AMB CP REHAB GOALS [phone]}  Goals Unmet:  {CHL AMB CP REHAB GOALS UNMET:[phone]}  Comments: ***   {CHL AMB CP REHAB MEDICAL DIRECTORS:[phone]}

## 2023-01-02 ENCOUNTER — Encounter (HOSPITAL_COMMUNITY)
Admission: RE | Admit: 2023-01-02 | Discharge: 2023-01-02 | Disposition: A | Discharge status: Home / Self Care | Payer: Medicare Other | Source: Ambulatory Visit | Attending: Cardiovascular Disease | Admitting: Cardiovascular Disease

## 2023-01-02 DIAGNOSIS — Z7901 Long term (current) use of anticoagulants: Secondary | ICD-10-CM | POA: Insufficient documentation

## 2023-01-02 DIAGNOSIS — I110 Hypertensive heart disease with heart failure: Secondary | ICD-10-CM | POA: Diagnosis not present

## 2023-01-02 DIAGNOSIS — Z5189 Encounter for other specified aftercare: Secondary | ICD-10-CM | POA: Diagnosis not present

## 2023-01-02 DIAGNOSIS — Z79899 Other long term (current) drug therapy: Secondary | ICD-10-CM | POA: Insufficient documentation

## 2023-01-02 DIAGNOSIS — I5022 Chronic systolic (congestive) heart failure: Secondary | ICD-10-CM | POA: Insufficient documentation

## 2023-01-02 DIAGNOSIS — I447 Left bundle-branch block, unspecified: Secondary | ICD-10-CM | POA: Insufficient documentation

## 2023-01-02 DIAGNOSIS — I4892 Unspecified atrial flutter: Secondary | ICD-10-CM | POA: Insufficient documentation

## 2023-01-02 DIAGNOSIS — I480 Paroxysmal atrial fibrillation: Secondary | ICD-10-CM | POA: Diagnosis not present

## 2023-01-05 ENCOUNTER — Encounter (HOSPITAL_COMMUNITY)
Admission: RE | Admit: 2023-01-05 | Discharge: 2023-01-05 | Disposition: A | Discharge status: Home / Self Care | Payer: Medicare Other | Source: Ambulatory Visit | Attending: Cardiovascular Disease | Admitting: Cardiovascular Disease

## 2023-01-05 DIAGNOSIS — I480 Paroxysmal atrial fibrillation: Secondary | ICD-10-CM | POA: Diagnosis not present

## 2023-01-05 DIAGNOSIS — I5022 Chronic systolic (congestive) heart failure: Secondary | ICD-10-CM | POA: Diagnosis not present

## 2023-01-05 DIAGNOSIS — I110 Hypertensive heart disease with heart failure: Secondary | ICD-10-CM | POA: Diagnosis not present

## 2023-01-05 DIAGNOSIS — Z5189 Encounter for other specified aftercare: Secondary | ICD-10-CM | POA: Diagnosis not present

## 2023-01-05 DIAGNOSIS — I4892 Unspecified atrial flutter: Secondary | ICD-10-CM | POA: Diagnosis not present

## 2023-01-05 DIAGNOSIS — I447 Left bundle-branch block, unspecified: Secondary | ICD-10-CM | POA: Diagnosis not present

## 2023-01-07 ENCOUNTER — Encounter (HOSPITAL_COMMUNITY)
Admission: RE | Admit: 2023-01-07 | Discharge: 2023-01-07 | Disposition: A | Discharge status: Home / Self Care | Payer: Medicare Other | Source: Ambulatory Visit | Attending: Cardiovascular Disease | Admitting: Cardiovascular Disease

## 2023-01-07 DIAGNOSIS — I110 Hypertensive heart disease with heart failure: Secondary | ICD-10-CM | POA: Diagnosis not present

## 2023-01-07 DIAGNOSIS — I447 Left bundle-branch block, unspecified: Secondary | ICD-10-CM | POA: Diagnosis not present

## 2023-01-07 DIAGNOSIS — Z5189 Encounter for other specified aftercare: Secondary | ICD-10-CM | POA: Diagnosis not present

## 2023-01-07 DIAGNOSIS — I5022 Chronic systolic (congestive) heart failure: Secondary | ICD-10-CM

## 2023-01-07 DIAGNOSIS — I480 Paroxysmal atrial fibrillation: Secondary | ICD-10-CM | POA: Diagnosis not present

## 2023-01-07 DIAGNOSIS — I4892 Unspecified atrial flutter: Secondary | ICD-10-CM | POA: Diagnosis not present

## 2023-01-09 ENCOUNTER — Encounter (HOSPITAL_COMMUNITY)
Admission: RE | Admit: 2023-01-09 | Discharge: 2023-01-09 | Disposition: A | Discharge status: Home / Self Care | Payer: Medicare Other | Source: Ambulatory Visit | Attending: Cardiovascular Disease | Admitting: Cardiovascular Disease

## 2023-01-09 DIAGNOSIS — Z5189 Encounter for other specified aftercare: Secondary | ICD-10-CM | POA: Diagnosis not present

## 2023-01-09 DIAGNOSIS — I5022 Chronic systolic (congestive) heart failure: Secondary | ICD-10-CM | POA: Diagnosis not present

## 2023-01-09 DIAGNOSIS — I447 Left bundle-branch block, unspecified: Secondary | ICD-10-CM | POA: Diagnosis not present

## 2023-01-09 DIAGNOSIS — I4892 Unspecified atrial flutter: Secondary | ICD-10-CM | POA: Diagnosis not present

## 2023-01-09 DIAGNOSIS — I110 Hypertensive heart disease with heart failure: Secondary | ICD-10-CM | POA: Diagnosis not present

## 2023-01-09 DIAGNOSIS — I480 Paroxysmal atrial fibrillation: Secondary | ICD-10-CM | POA: Diagnosis not present

## 2023-01-12 ENCOUNTER — Encounter (HOSPITAL_COMMUNITY)
Admission: RE | Admit: 2023-01-12 | Discharge: 2023-01-12 | Disposition: A | Discharge status: Home / Self Care | Payer: Medicare Other | Source: Ambulatory Visit | Attending: Cardiovascular Disease | Admitting: Cardiovascular Disease

## 2023-01-12 DIAGNOSIS — I5022 Chronic systolic (congestive) heart failure: Secondary | ICD-10-CM

## 2023-01-12 DIAGNOSIS — I447 Left bundle-branch block, unspecified: Secondary | ICD-10-CM | POA: Diagnosis not present

## 2023-01-12 DIAGNOSIS — Z5189 Encounter for other specified aftercare: Secondary | ICD-10-CM | POA: Diagnosis not present

## 2023-01-12 DIAGNOSIS — I4892 Unspecified atrial flutter: Secondary | ICD-10-CM | POA: Diagnosis not present

## 2023-01-12 DIAGNOSIS — I480 Paroxysmal atrial fibrillation: Secondary | ICD-10-CM | POA: Diagnosis not present

## 2023-01-12 DIAGNOSIS — I110 Hypertensive heart disease with heart failure: Secondary | ICD-10-CM | POA: Diagnosis not present

## 2023-01-14 ENCOUNTER — Encounter (HOSPITAL_COMMUNITY): Payer: Medicare Other

## 2023-01-14 DIAGNOSIS — H40013 Open angle with borderline findings, low risk, bilateral: Secondary | ICD-10-CM | POA: Diagnosis not present

## 2023-01-14 DIAGNOSIS — H18513 Endothelial corneal dystrophy, bilateral: Secondary | ICD-10-CM | POA: Diagnosis not present

## 2023-01-14 DIAGNOSIS — H43821 Vitreomacular adhesion, right eye: Secondary | ICD-10-CM | POA: Diagnosis not present

## 2023-01-14 DIAGNOSIS — H2513 Age-related nuclear cataract, bilateral: Secondary | ICD-10-CM | POA: Diagnosis not present

## 2023-01-14 DIAGNOSIS — H18413 Arcus senilis, bilateral: Secondary | ICD-10-CM | POA: Diagnosis not present

## 2023-01-16 ENCOUNTER — Encounter (HOSPITAL_COMMUNITY)
Admission: RE | Admit: 2023-01-16 | Discharge: 2023-01-16 | Disposition: A | Discharge status: Home / Self Care | Payer: Medicare Other | Source: Ambulatory Visit | Attending: Cardiovascular Disease | Admitting: Cardiovascular Disease

## 2023-01-16 DIAGNOSIS — I5022 Chronic systolic (congestive) heart failure: Secondary | ICD-10-CM | POA: Diagnosis not present

## 2023-01-16 DIAGNOSIS — I4892 Unspecified atrial flutter: Secondary | ICD-10-CM | POA: Diagnosis not present

## 2023-01-16 DIAGNOSIS — I447 Left bundle-branch block, unspecified: Secondary | ICD-10-CM | POA: Diagnosis not present

## 2023-01-16 DIAGNOSIS — I480 Paroxysmal atrial fibrillation: Secondary | ICD-10-CM | POA: Diagnosis not present

## 2023-01-16 DIAGNOSIS — Z5189 Encounter for other specified aftercare: Secondary | ICD-10-CM | POA: Diagnosis not present

## 2023-01-16 DIAGNOSIS — I110 Hypertensive heart disease with heart failure: Secondary | ICD-10-CM | POA: Diagnosis not present

## 2023-01-16 NOTE — Progress Notes (Signed)
CARDIAC REHAB PHASE 2  Reviewed home exercise with pt today. Pt is tolerating exercise well. Pt will continue to exercise on his own by walking for 10-30 minutes per session 1-2 days a week in addition to the 3 days in CRP2. Advised pt on THRR, RPE scale, hydration and temperature/humidity precautions. Reinforced S/S to stop exercise and when to call MD vs 911. Encouraged warm up cool down and stretches with exercise sessions. Pt verbalized understanding, all questions were answered and pt was given a copy to take home.    Kirby Funk ACSM-CEP 01/16/2023 4:27 PM

## 2023-01-19 ENCOUNTER — Encounter (HOSPITAL_COMMUNITY)
Admission: RE | Admit: 2023-01-19 | Discharge: 2023-01-19 | Disposition: A | Discharge status: Home / Self Care | Payer: Medicare Other | Source: Ambulatory Visit | Attending: Cardiovascular Disease | Admitting: Cardiovascular Disease

## 2023-01-19 DIAGNOSIS — I447 Left bundle-branch block, unspecified: Secondary | ICD-10-CM | POA: Diagnosis not present

## 2023-01-19 DIAGNOSIS — Z5189 Encounter for other specified aftercare: Secondary | ICD-10-CM | POA: Diagnosis not present

## 2023-01-19 DIAGNOSIS — I110 Hypertensive heart disease with heart failure: Secondary | ICD-10-CM | POA: Diagnosis not present

## 2023-01-19 DIAGNOSIS — I4892 Unspecified atrial flutter: Secondary | ICD-10-CM | POA: Diagnosis not present

## 2023-01-19 DIAGNOSIS — I5022 Chronic systolic (congestive) heart failure: Secondary | ICD-10-CM | POA: Diagnosis not present

## 2023-01-19 DIAGNOSIS — I480 Paroxysmal atrial fibrillation: Secondary | ICD-10-CM | POA: Diagnosis not present

## 2023-01-20 ENCOUNTER — Encounter (INDEPENDENT_AMBULATORY_CARE_PROVIDER_SITE_OTHER): Payer: Medicare Other | Admitting: Cardiovascular Disease

## 2023-01-20 ENCOUNTER — Encounter: Payer: Self-pay | Admitting: Cardiovascular Disease

## 2023-01-20 VITALS — BP 126/68 | HR 96 | Ht 68.0 in | Wt 141.4 lb

## 2023-01-20 DIAGNOSIS — I480 Paroxysmal atrial fibrillation: Secondary | ICD-10-CM

## 2023-01-20 DIAGNOSIS — I110 Hypertensive heart disease with heart failure: Secondary | ICD-10-CM | POA: Diagnosis not present

## 2023-01-20 DIAGNOSIS — I447 Left bundle-branch block, unspecified: Secondary | ICD-10-CM | POA: Diagnosis not present

## 2023-01-20 DIAGNOSIS — Z5189 Encounter for other specified aftercare: Secondary | ICD-10-CM | POA: Diagnosis not present

## 2023-01-20 DIAGNOSIS — I4892 Unspecified atrial flutter: Secondary | ICD-10-CM | POA: Diagnosis not present

## 2023-01-20 DIAGNOSIS — I5022 Chronic systolic (congestive) heart failure: Secondary | ICD-10-CM | POA: Diagnosis not present

## 2023-01-20 NOTE — Patient Instructions (Signed)
Medication Instructions:  The current medical regimen is effective;  continue present plan and medications.  *If you need a refill on your cardiac medications before your next appointment, please call your pharmacy*  Follow-Up: At [facility], you and your health needs are our priority.  As part of our continuing mission to provide you with exceptional heart care, we have created designated Provider Care Teams.  These Care Teams include your primary Cardiologist (physician) and Advanced Practice Providers (APPs -  Physician Assistants and Nurse Practitioners) who all work together to provide you with the care you need, when you need it.  We recommend signing up for the patient portal called "MyChart".  Sign up information is provided on this After Visit Summary.  MyChart is used to connect with patients for Virtual Visits (Telemedicine).  Patients are able to view lab/test results, encounter notes, upcoming appointments, etc.  Non-urgent messages can be sent to your provider as well.   To learn more about what you can do with MyChart, go to [email or web address].    Your next appointment:   3 month(s)  Provider:   Mertie Moores, MD

## 2023-01-20 NOTE — Progress Notes (Addendum)
Cardiology Office Note:    Date:  01/20/2023   ID:  Douglas Matthews, DOB 1935-11-09, MRN [id number]  PCP:  [place], [name], [place] Providers Cardiologist:  Mertie Moores, MD {     Referring MD: [place]ique, [name], MD   Chief Complaint  Patient presents with   Congestive Heart Failure     History of Present Illness:    Douglas Matthews is a 88 y.o. male with a hx of  Atrial fib/ flutter, HLD, HTN  I met him in the hospital in Nov. 2023 with atrial flutter, new LBBB ,  Has new Dx of CHF Had successful TEE / CV  Is on eliquis 2.5 BID   Breathing has continued to improve.   Pt is on coreg 12.5 BID , hydralazine 10 mg TId, , Imdur 30 mg a day  Has lasix as needed - has not needed it    Feb. 20, 2024 Complains that he cannot taste his food , not covid related.   Occasionally feels fatigued  Still exercising ,  Goes to rehab.  Echo from Nov, 2023 shows EF 25-30% Repeat echo is scheduled for 2 weeks    He has gone back into atrial fib .    Past Medical History:  Diagnosis Date   Anemia    BPH (benign prostatic hypertrophy)    Diverticulosis    Hyperlipidemia    Hypertension     Past Surgical History:  Procedure Laterality Date   APPENDECTOMY     CARDIOVERSION N/A 10/29/2022   Procedure: CARDIOVERSION;  Surgeon: Lelon Perla, MD;  Location: [facility];  Service: Cardiovascular;  Laterality: N/A;   HERNIA REPAIR     TEE WITHOUT CARDIOVERSION N/A 10/29/2022   Procedure: TRANSESOPHAGEAL ECHOCARDIOGRAM (TEE);  Surgeon: Lelon Perla, MD;  Location: [facility] ENDOSCOPY;  Service: Cardiovascular;  Laterality: N/A;    Current Medications: Current Meds  Medication Sig   apixaban (ELIQUIS) 2.5 MG TABS tablet Take 1 tablet (2.5 mg total) by mouth 2 (two) times daily.   COD LIVER OIL PO Take 1 tablet by mouth daily.   furosemide (LASIX) 40 MG tablet Take 1 tablet (40 mg total) by mouth daily as needed for edema or fluid (as needed in case of  weight gain 2 to 3 lbs in 24 hrs or 5 lbs in 7 days, leg swelling or shortness of breath.).   GARLIC PO Take 1 tablet by mouth daily.   hydrALAZINE (APRESOLINE) 10 MG tablet Take 1 tablet (10 mg total) by mouth 3 (three) times daily.   isosorbide mononitrate (IMDUR) 30 MG 24 hr tablet Take 1 tablet (30 mg total) by mouth daily.   metoprolol succinate (TOPROL XL) 25 MG 24 hr tablet Take 3 tablets (75 mg total) by mouth daily.   Multiple Vitamin (MULTIVITAMIN) capsule Take 1 capsule by mouth daily.   Saw Palmetto 450 MG CAPS Take 900 mg by mouth daily.     Allergies:   Patient has no known allergies.   Social History   Socioeconomic History   Marital status: Married    Spouse name: Not on file   Number of children: Not on file   Years of education: Not on file   Highest education level: Not on file  Occupational History   Occupation: Retired  Tobacco Use   Smoking status: Never   Smokeless tobacco: Never  Vaping Use   Vaping Use: Never used  Substance and Sexual Activity  Alcohol use: No   Drug use: No   Sexual activity: Not on file  Other Topics Concern   Not on file  Social History Narrative   Married x 50 years    Social Determinants of Health   Financial Resource Strain: Low Risk  (07/08/2022)   Overall Financial Resource Strain (CARDIA)    Difficulty of Paying Living Expenses: Not hard at all  Food Insecurity: No Food Insecurity (10/25/2022)   Hunger Vital Sign    Worried About Running Out of Food in the Last Year: Never true    Ran Out of Food in the Last Year: Never true  Transportation Needs: No Transportation Needs (10/25/2022)   PRAPARE - Hydrologist (Medical): No    Lack of Transportation (Non-Medical): No  Physical Activity: Insufficiently Active (07/08/2022)   Exercise Vital Sign    Days of Exercise per Week: 2 days    Minutes of Exercise per Session: 30 min  Stress: No Stress Concern Present (07/08/2022)   [place]    Feeling of Stress : Not at all  Social Connections: Moderately Integrated (07/02/2021)   Social Connection and Isolation Panel [NHANES]    Frequency of Communication with Friends and Family: Three times a week    Frequency of Social Gatherings with Friends and Family: Three times a week    Attends Religious Services: More than 4 times per year    Active Member of Clubs or Organizations: No    Attends Archivist Meetings: Never    Marital Status: Married     Family History: The patient's family history includes Heart disease in an other family member.  ROS:   Please see the history of present illness.     All other systems reviewed and are negative.  EKGs/Labs/Other Studies Reviewed:    The following studies were reviewed today:    Recent Labs: 10/24/2022: B Natriuretic Peptide 721.7 10/25/2022: Magnesium 2.2; TSH 4.260 11/03/2022: Hemoglobin 8.9; Platelets 326 11/11/2022: BUN 84; Creatinine, Ser 2.71; Potassium 5.5; Sodium 141  Recent Lipid Panel    Component Value Date/Time   CHOL 259 (H) 05/08/2021 0950   TRIG 87.0 05/08/2021 0950   HDL 59.40 05/08/2021 0950   CHOLHDL 4 05/08/2021 0950   VLDL 17.4 05/08/2021 0950   LDLCALC 182 (H) 05/08/2021 0950   LDLDIRECT 220.5 12/15/2011 1137     Risk Assessment/Calculations:    CHA2DS2-VASc Score = 4   This indicates a 4.8% annual risk of stroke. The patient's score is based upon: CHF History: 1 HTN History: 1 Diabetes History: 0 Stroke History: 0 Vascular Disease History: 0 Age Score: 2 Gender Score: 0         Physical Exam:    Physical Exam: Blood pressure 126/68, pulse 96, height '5\' 8"'$  (1.727 m), weight 141 lb 6.4 oz (64.1 kg), SpO2 99 %.       GEN:  Well nourished, elderly male in no acute distress HEENT: Normal NECK: No JVD; No carotid bruits LYMPHATICS: No lymphadenopathy CARDIAC: ireg. Irreg.  RESPIRATORY:  Clear to auscultation  without rales, wheezing or rhonchi  ABDOMEN: Soft, non-tender, non-distended MUSCULOSKELETAL:  No edema; No deformity  SKIN: Warm and dry NEUROLOGIC:  Alert and oriented x 3   EKG:    January 20, 2023: Atrial fibrillation with heart rate of 81.  Possible lateral wall infarction.  T wave inversions in the inferior leads.  No changes from previous EKGs.  Possible limb lead reversal    ASSESSMENT:    1. PAF (paroxysmal atrial fibrillation) (HCC)     PLAN:       Acute on chronic combined systolic and diastolic congestive heart failure: [place] is seen today for follow-up of his recently diagnosed congestive heart failure.     His EF is severely depressed.   Has significant CKD with creatinine of > 3.    He is on hydralazine and nitrates, toprol XL. I doubt we will be able to add additional CHF medications given his severe renal dysfunction.   2.  Atrial fibrillation:   He is back in atrial fibrillation today.  Fortunately his rate is well-controlled.  Continue current medications.  He has an echocardiogram scheduled for several weeks from now.  I will plan on seeing him back in the office in 3 months for follow-up visit.  At this point , I would favor rate control and anticoauglation instead of a rhythm control strategy.              Medication Adjustments/Labs and Tests Ordered: Current medicines are reviewed at length with the patient today.  Concerns regarding medicines are outlined above.  Orders Placed This Encounter  Procedures   EKG 12-Lead   No orders of the defined types were placed in this encounter.   Patient Instructions  Medication Instructions:  The current medical regimen is effective;  continue present plan and medications.  *If you need a refill on your cardiac medications before your next appointment, please call your pharmacy*  Follow-Up: At [facility], you and your health needs are our priority.  As part of our continuing mission to provide  you with exceptional heart care, we have created designated Provider Care Teams.  These Care Teams include your primary Cardiologist (physician) and Advanced Practice Providers (APPs -  Physician Assistants and Nurse Practitioners) who all work together to provide you with the care you need, when you need it.  We recommend signing up for the patient portal called "MyChart".  Sign up information is provided on this After Visit Summary.  MyChart is used to connect with patients for Virtual Visits (Telemedicine).  Patients are able to view lab/test results, encounter notes, upcoming appointments, etc.  Non-urgent messages can be sent to your provider as well.   To learn more about what you can do with MyChart, go to [email or web address].    Your next appointment:   3 month(s)  Provider:   Mertie Moores, MD       Signed, Mertie Moores, MD  01/20/2023 2:25 PM    [place]

## 2023-01-21 ENCOUNTER — Encounter (HOSPITAL_COMMUNITY)
Admission: RE | Admit: 2023-01-21 | Discharge: 2023-01-21 | Disposition: A | Discharge status: Home / Self Care | Payer: Medicare Other | Source: Ambulatory Visit | Attending: Cardiovascular Disease | Admitting: Cardiovascular Disease

## 2023-01-21 DIAGNOSIS — Z5189 Encounter for other specified aftercare: Secondary | ICD-10-CM | POA: Diagnosis not present

## 2023-01-21 DIAGNOSIS — I4892 Unspecified atrial flutter: Secondary | ICD-10-CM | POA: Diagnosis not present

## 2023-01-21 DIAGNOSIS — I447 Left bundle-branch block, unspecified: Secondary | ICD-10-CM | POA: Diagnosis not present

## 2023-01-21 DIAGNOSIS — I5022 Chronic systolic (congestive) heart failure: Secondary | ICD-10-CM | POA: Diagnosis not present

## 2023-01-21 DIAGNOSIS — I110 Hypertensive heart disease with heart failure: Secondary | ICD-10-CM | POA: Diagnosis not present

## 2023-01-21 DIAGNOSIS — I480 Paroxysmal atrial fibrillation: Secondary | ICD-10-CM | POA: Diagnosis not present

## 2023-01-23 ENCOUNTER — Encounter (HOSPITAL_COMMUNITY): Payer: Medicare Other

## 2023-01-26 ENCOUNTER — Encounter (HOSPITAL_COMMUNITY): Payer: Medicare Other

## 2023-01-27 ENCOUNTER — Other Ambulatory Visit: Payer: Self-pay

## 2023-01-27 ENCOUNTER — Telehealth: Payer: Self-pay | Admitting: Cardiovascular Disease

## 2023-01-27 ENCOUNTER — Inpatient Hospital Stay (HOSPITAL_BASED_OUTPATIENT_CLINIC_OR_DEPARTMENT_OTHER)
Admission: EM | Admit: 2023-01-27 | Discharge: 2023-01-30 | DRG: 683 | Disposition: A | Discharge status: Home / Self Care | Payer: Medicare Other | Attending: Internal Medicine | Admitting: Internal Medicine

## 2023-01-27 ENCOUNTER — Emergency Department (HOSPITAL_BASED_OUTPATIENT_CLINIC_OR_DEPARTMENT_OTHER): Payer: Medicare Other | Admitting: Radiology

## 2023-01-27 DIAGNOSIS — E875 Hyperkalemia: Secondary | ICD-10-CM | POA: Diagnosis present

## 2023-01-27 DIAGNOSIS — N183 Chronic kidney disease, stage 3 unspecified: Secondary | ICD-10-CM

## 2023-01-27 DIAGNOSIS — I2510 Atherosclerotic heart disease of native coronary artery without angina pectoris: Secondary | ICD-10-CM | POA: Diagnosis not present

## 2023-01-27 DIAGNOSIS — I4819 Other persistent atrial fibrillation: Secondary | ICD-10-CM | POA: Diagnosis present

## 2023-01-27 DIAGNOSIS — Z79899 Other long term (current) drug therapy: Secondary | ICD-10-CM | POA: Diagnosis not present

## 2023-01-27 DIAGNOSIS — I5020 Unspecified systolic (congestive) heart failure: Secondary | ICD-10-CM | POA: Diagnosis not present

## 2023-01-27 DIAGNOSIS — N401 Enlarged prostate with lower urinary tract symptoms: Secondary | ICD-10-CM | POA: Diagnosis present

## 2023-01-27 DIAGNOSIS — I130 Hypertensive heart and chronic kidney disease with heart failure and stage 1 through stage 4 chronic kidney disease, or unspecified chronic kidney disease: Secondary | ICD-10-CM | POA: Diagnosis present

## 2023-01-27 DIAGNOSIS — R351 Nocturia: Secondary | ICD-10-CM | POA: Diagnosis present

## 2023-01-27 DIAGNOSIS — I447 Left bundle-branch block, unspecified: Secondary | ICD-10-CM | POA: Diagnosis present

## 2023-01-27 DIAGNOSIS — I482 Chronic atrial fibrillation, unspecified: Secondary | ICD-10-CM | POA: Diagnosis not present

## 2023-01-27 DIAGNOSIS — E872 Acidosis, unspecified: Secondary | ICD-10-CM | POA: Diagnosis present

## 2023-01-27 DIAGNOSIS — E785 Hyperlipidemia, unspecified: Secondary | ICD-10-CM | POA: Diagnosis present

## 2023-01-27 DIAGNOSIS — R112 Nausea with vomiting, unspecified: Secondary | ICD-10-CM | POA: Diagnosis not present

## 2023-01-27 DIAGNOSIS — N179 Acute kidney failure, unspecified: Principal | ICD-10-CM | POA: Diagnosis present

## 2023-01-27 DIAGNOSIS — N184 Chronic kidney disease, stage 4 (severe): Secondary | ICD-10-CM | POA: Diagnosis present

## 2023-01-27 DIAGNOSIS — E869 Volume depletion, unspecified: Secondary | ICD-10-CM | POA: Diagnosis present

## 2023-01-27 DIAGNOSIS — Z7901 Long term (current) use of anticoagulants: Secondary | ICD-10-CM

## 2023-01-27 DIAGNOSIS — I5042 Chronic combined systolic (congestive) and diastolic (congestive) heart failure: Secondary | ICD-10-CM | POA: Diagnosis present

## 2023-01-27 DIAGNOSIS — I129 Hypertensive chronic kidney disease with stage 1 through stage 4 chronic kidney disease, or unspecified chronic kidney disease: Secondary | ICD-10-CM | POA: Diagnosis not present

## 2023-01-27 DIAGNOSIS — I10 Essential (primary) hypertension: Secondary | ICD-10-CM | POA: Diagnosis present

## 2023-01-27 DIAGNOSIS — R0602 Shortness of breath: Secondary | ICD-10-CM | POA: Diagnosis not present

## 2023-01-27 DIAGNOSIS — Z1152 Encounter for screening for COVID-19: Secondary | ICD-10-CM | POA: Diagnosis not present

## 2023-01-27 DIAGNOSIS — R110 Nausea: Secondary | ICD-10-CM | POA: Diagnosis present

## 2023-01-27 DIAGNOSIS — J90 Pleural effusion, not elsewhere classified: Secondary | ICD-10-CM | POA: Diagnosis not present

## 2023-01-27 DIAGNOSIS — R0789 Other chest pain: Secondary | ICD-10-CM | POA: Diagnosis not present

## 2023-01-27 DIAGNOSIS — N189 Chronic kidney disease, unspecified: Secondary | ICD-10-CM | POA: Diagnosis not present

## 2023-01-27 LAB — CBC
HCT: 39.1 % (ref 39.0–52.0)
Hemoglobin: 12.7 g/dL — ABNORMAL LOW (ref 13.0–17.0)
MCH: 29.1 pg (ref 26.0–34.0)
MCHC: 32.5 g/dL (ref 30.0–36.0)
MCV: 89.5 fL (ref 80.0–100.0)
Platelets: 172 10*3/uL (ref 150–400)
RBC: 4.37 MIL/uL (ref 4.22–5.81)
RDW: 16.6 % — ABNORMAL HIGH (ref 11.5–15.5)
WBC: 4.7 10*3/uL (ref 4.0–10.5)
nRBC: 0 % (ref 0.0–0.2)

## 2023-01-27 LAB — I-STAT VENOUS BLOOD GAS, ED
Acid-base deficit: 9 mmol/L — ABNORMAL HIGH (ref 0.0–2.0)
Bicarbonate: 17.5 mmol/L — ABNORMAL LOW (ref 20.0–28.0)
Calcium, Ion: 1.13 mmol/L — ABNORMAL LOW (ref 1.15–1.40)
HCT: 33 % — ABNORMAL LOW (ref 39.0–52.0)
Hemoglobin: 11.2 g/dL — ABNORMAL LOW (ref 13.0–17.0)
O2 Saturation: 20 %
Potassium: 5.7 mmol/L — ABNORMAL HIGH (ref 3.5–5.1)
Sodium: 142 mmol/L (ref 135–145)
TCO2: 19 mmol/L — ABNORMAL LOW (ref 22–32)
pCO2, Ven: 40.6 mmHg — ABNORMAL LOW (ref 44–60)
pH, Ven: 7.242 — ABNORMAL LOW (ref 7.25–7.43)
pO2, Ven: 18 mmHg — CL (ref 32–45)

## 2023-01-27 LAB — CBG MONITORING, ED: Glucose-Capillary: 129 mg/dL — ABNORMAL HIGH (ref 70–99)

## 2023-01-27 LAB — BASIC METABOLIC PANEL
Anion gap: 13 (ref 5–15)
Anion gap: 11 (ref 5–15)
BUN: 98 mg/dL — ABNORMAL HIGH (ref 8–23)
BUN: 97 mg/dL — ABNORMAL HIGH (ref 8–23)
CO2: 17 mmol/L — ABNORMAL LOW (ref 22–32)
CO2: 18 mmol/L — ABNORMAL LOW (ref 22–32)
Calcium: 9.8 mg/dL (ref 8.9–10.3)
Calcium: 9.5 mg/dL (ref 8.9–10.3)
Chloride: 109 mmol/L (ref 98–111)
Chloride: 112 mmol/L — ABNORMAL HIGH (ref 98–111)
Creatinine, Ser: 4.13 mg/dL — ABNORMAL HIGH (ref 0.61–1.24)
Creatinine, Ser: 3.93 mg/dL — ABNORMAL HIGH (ref 0.61–1.24)
GFR, Estimated: 13 mL/min — ABNORMAL LOW (ref >60)
GFR, Estimated: 14 mL/min — ABNORMAL LOW (ref >60)
Glucose, Bld: 133 mg/dL — ABNORMAL HIGH (ref 70–99)
Glucose, Bld: 132 mg/dL — ABNORMAL HIGH (ref 70–99)
Potassium: 6 mmol/L — ABNORMAL HIGH (ref 3.5–5.1)
Potassium: 4.9 mmol/L (ref 3.5–5.1)
Sodium: 139 mmol/L (ref 135–145)
Sodium: 141 mmol/L (ref 135–145)

## 2023-01-27 LAB — RESP PANEL BY RT-PCR (RSV, FLU A&B, COVID)  RVPGX2
Influenza A by PCR: NEGATIVE
Influenza B by PCR: NEGATIVE
Resp Syncytial Virus by PCR: NEGATIVE
SARS Coronavirus 2 by RT PCR: NEGATIVE

## 2023-01-27 LAB — HEPATIC FUNCTION PANEL
ALT: 42 U/L (ref 0–44)
AST: 34 U/L (ref 15–41)
Albumin: 4.5 g/dL (ref 3.5–5.0)
Alkaline Phosphatase: 135 U/L — ABNORMAL HIGH (ref 38–126)
Bilirubin, Direct: 0.1 mg/dL (ref 0.0–0.2)
Indirect Bilirubin: 0.6 mg/dL (ref 0.3–0.9)
Total Bilirubin: 0.7 mg/dL (ref 0.3–1.2)
Total Protein: 8.1 g/dL (ref 6.5–8.1)

## 2023-01-27 LAB — TROPONIN I (HIGH SENSITIVITY)
Troponin I (High Sensitivity): 130 ng/L (ref <18)
Troponin I (High Sensitivity): 106 ng/L (ref <18)

## 2023-01-27 LAB — BRAIN NATRIURETIC PEPTIDE: B Natriuretic Peptide: 856.9 pg/mL — ABNORMAL HIGH (ref 0.0–100.0)

## 2023-01-27 LAB — LIPASE, BLOOD: Lipase: 19 U/L (ref 11–51)

## 2023-01-27 LAB — POTASSIUM: Potassium: 6.6 mmol/L (ref 3.5–5.1)

## 2023-01-27 MED ORDER — SODIUM BICARBONATE 8.4 % IV SOLN
INTRAVENOUS | Status: AC
  Filled 2023-01-27: qty 50

## 2023-01-27 MED ORDER — CALCIUM GLUCONATE-NACL 1-0.675 GM/50ML-% IV SOLN
1.0000 g | Freq: Once | INTRAVENOUS | Status: AC
  Administered 2023-01-27: 1000 mg via INTRAVENOUS
  Filled 2023-01-27: qty 50

## 2023-01-27 MED ORDER — ALBUTEROL SULFATE (2.5 MG/3ML) 0.083% IN NEBU
10.0000 mg | INHALATION_SOLUTION | Freq: Once | RESPIRATORY_TRACT | Status: AC
  Administered 2023-01-27: 10 mg via RESPIRATORY_TRACT
  Filled 2023-01-27: qty 12

## 2023-01-27 MED ORDER — SODIUM CHLORIDE 0.9 % IV BOLUS
500.0000 mL | Freq: Once | INTRAVENOUS | Status: AC
  Administered 2023-01-27: 500 mL via INTRAVENOUS

## 2023-01-27 MED ORDER — DEXTROSE 50 % IV SOLN
1.0000 | Freq: Once | INTRAVENOUS | Status: AC
  Administered 2023-01-27: 50 mL via INTRAVENOUS
  Filled 2023-01-27: qty 50

## 2023-01-27 MED ORDER — SODIUM ZIRCONIUM CYCLOSILICATE 10 G PO PACK
10.0000 g | PACK | Freq: Once | ORAL | Status: AC
  Administered 2023-01-27: 10 g via ORAL
  Filled 2023-01-27: qty 1

## 2023-01-27 MED ORDER — INSULIN ASPART 100 UNIT/ML IV SOLN
5.0000 [IU] | Freq: Once | INTRAVENOUS | Status: AC
  Administered 2023-01-27: 5 [IU] via INTRAVENOUS

## 2023-01-27 MED ORDER — SODIUM CHLORIDE 0.45 % IV SOLN
INTRAVENOUS | Status: DC
  Filled 2023-01-27 (×3): qty 75

## 2023-01-27 NOTE — Telephone Encounter (Signed)
Patient wife called stating pt has decreased appetite, vomiting on and off for 2-3 days, and shortness of breath hasn't worsen. Pt has not contact PCP pertaining to symptoms, he is complaining of itching around his neck. Someone mentioned it maybe IMDUR causing this symptoms. Pt stated on day last week he did not take the medication and the itching subsided. Pt is going to urgent care and will like advise from MD. Will forward to MD and nurse.

## 2023-01-27 NOTE — ED Notes (Signed)
Patient resting quietly in stretcher, respirations even, unlabored, no acute distress noted. Denies needs at this time.

## 2023-01-27 NOTE — ED Triage Notes (Signed)
CAOx4. NAD. Ambulatory.  Describes chest discomfort and occasional SOB. Difficulty keeping down food down since yesterday. No urinary changes.

## 2023-01-27 NOTE — Telephone Encounter (Signed)
Pt is calling back stating they have not heard back and would like to know if they should go to the ED.

## 2023-01-27 NOTE — ED Provider Notes (Signed)
[facility] Provider Note   CSN: [id number] Arrival date & time: 01/27/23  1603     History  Chief Complaint  Patient presents with   Shortness of Breath   Emesis    Douglas Matthews is a 88 y.o. male.  HPI   88 year old male with past medical history of HTN, HLD, anemia, CKD, atrial fibrillation on Eliquis presents to the emergency department with concern for nausea/vomiting, fatigue and an episode of chest discomfort that is currently resolved.  Patient states he has had vomiting since yesterday, difficulty keeping food down.  Today no appetite with decreased p.o. intake.  He is been compliant with his medications currently.  Describes mid and upper abdominal mild discomfort that is currently resolved.  No urinary symptoms.  Had an episode of shortness of breath that was resolved without any cough or swelling of the lower extremities.  No documented fever but intermittent chills.  Home Medications Prior to Admission medications   Medication Sig Start Date End Date Taking? Authorizing Provider  apixaban (ELIQUIS) 2.5 MG TABS tablet Take 1 tablet (2.5 mg total) by mouth 2 (two) times daily. 11/21/22   Nahser, Wonda Cheng, MD  COD LIVER OIL PO Take 1 tablet by mouth daily.    [provider]  furosemide (LASIX) 40 MG tablet Take 1 tablet (40 mg total) by mouth daily as needed for edema or fluid (as needed in case of weight gain 2 to 3 lbs in 24 hrs or 5 lbs in 7 days, leg swelling or shortness of breath.). 11/21/22   Nahser, Wonda Cheng, MD  GARLIC PO Take 1 tablet by mouth daily.    [provider]  hydrALAZINE (APRESOLINE) 10 MG tablet Take 1 tablet (10 mg total) by mouth 3 (three) times daily. 11/21/22   Nahser, Wonda Cheng, MD  isosorbide mononitrate (IMDUR) 30 MG 24 hr tablet Take 1 tablet (30 mg total) by mouth daily. 11/21/22   Nahser, Wonda Cheng, MD  metoprolol succinate (TOPROL XL) 25 MG 24 hr tablet Take 3 tablets (75 mg  total) by mouth daily. 12/30/22   Nahser, Wonda Cheng, MD  Multiple Vitamin (MULTIVITAMIN) capsule Take 1 capsule by mouth daily.    [provider]  Saw Palmetto 450 MG CAPS Take 900 mg by mouth daily.    [provider]      Allergies    Patient has no known allergies.    Review of Systems   Review of Systems  Constitutional:  Positive for appetite change, chills and fatigue. Negative for fever.  Respiratory:  Negative for chest tightness and shortness of breath.   Cardiovascular:  Negative for chest pain, palpitations and leg swelling.  Gastrointestinal:  Positive for nausea and vomiting. Negative for abdominal pain and diarrhea.  Genitourinary:  Negative for flank pain.  Musculoskeletal:  Negative for back pain.  Skin:  Negative for rash.  Neurological:  Negative for syncope and headaches.    Physical Exam Updated Vital Signs BP (!) 142/88   Pulse (!) 51   Temp 97.7 F (36.5 C) (Oral)   Resp 12   Ht '5\' 8"'$  (1.727 m)   Wt 63.5 kg   SpO2 100%   BMI 21.29 kg/m  Physical Exam Vitals and nursing note reviewed.  Constitutional:      General: He is not in acute distress.    Appearance: Normal appearance.  HENT:     Head: Normocephalic.     Mouth/Throat:  Mouth: Mucous membranes are moist.  Cardiovascular:     Rate and Rhythm: Normal rate.  Pulmonary:     Effort: Pulmonary effort is normal. No tachypnea or respiratory distress.  Abdominal:     Palpations: Abdomen is soft.     Tenderness: There is no abdominal tenderness. There is no guarding or rebound.  Musculoskeletal:     Right lower leg: No edema.     Left lower leg: No edema.  Skin:    General: Skin is warm.  Neurological:     Mental Status: He is alert and oriented to person, place, and time. Mental status is at baseline.  Psychiatric:        Mood and Affect: Mood normal.     ED Results / Procedures / Treatments   Labs (all labs ordered are listed, but only abnormal results are  displayed) Labs Reviewed  BASIC METABOLIC PANEL - Abnormal; Notable for the following components:      Result Value   Potassium 6.0 (*)    CO2 17 (*)    Glucose, Bld 133 (*)    BUN 98 (*)    Creatinine, Ser 4.13 (*)    GFR, Estimated 13 (*)    All other components within normal limits  CBC - Abnormal; Notable for the following components:   Hemoglobin 12.7 (*)    RDW 16.6 (*)    All other components within normal limits  I-STAT VENOUS BLOOD GAS, ED - Abnormal; Notable for the following components:   pH, Ven 7.242 (*)    pCO2, Ven 40.6 (*)    pO2, Ven 18 (*)    Bicarbonate 17.5 (*)    TCO2 19 (*)    Acid-base deficit 9.0 (*)    Potassium 5.7 (*)    Calcium, Ion 1.13 (*)    HCT 33.0 (*)    Hemoglobin 11.2 (*)    All other components within normal limits  TROPONIN I (HIGH SENSITIVITY) - Abnormal; Notable for the following components:   Troponin I (High Sensitivity) 130 (*)    All other components within normal limits  RESP PANEL BY RT-PCR (RSV, FLU A&B, COVID)  RVPGX2  LIPASE, BLOOD  URINALYSIS, ROUTINE W REFLEX MICROSCOPIC  BLOOD GAS, VENOUS  HEPATIC FUNCTION PANEL  POTASSIUM  BRAIN NATRIURETIC PEPTIDE  TROPONIN I (HIGH SENSITIVITY)    EKG EKG Interpretation  Date/Time:  Tuesday January 27 2023 16:36:55 EST Ventricular Rate:  110 PR Interval:    QRS Duration: 134 QT Interval:  336 QTC Calculation: 454 R Axis:   -82 Text Interpretation: Atrial fibrillation with rapid ventricular response Left axis deviation Left ventricular hypertrophy with QRS widening and repolarization abnormality ( Cornell product ) Abnormal ECG When compared with ECG of 24-Dec-2022 12:01, Atrial fibrillation has replaced Sinus rhythm ST elevation now present in Anterior leads Similar to previous Confirmed by Lavenia Atlas [phone]) on 01/27/2023 4:38:50 PM  Radiology DG Chest 2 View  Result Date: 01/27/2023 CLINICAL DATA:  Shortness of breath.  Chest discomfort. EXAM: CHEST - 2 VIEW COMPARISON:   10/27/2022 FINDINGS: Heart size is normal. There is blunting of the right costophrenic angle. Opacity within the right base is noted which may reflect atelectasis or airspace disease. No interstitial edema. Left lung appears clear. IMPRESSION: Small right pleural effusion with right base opacity which may reflect atelectasis or airspace disease. Electronically Signed   By: Kerby Moors M.D.   On: 01/27/2023 16:55    Procedures .Critical Care  Performed by: Lorelle Gibbs,  DO Authorized by: Lorelle Gibbs, DO   Critical care provider statement:    Critical care time (minutes):  75   Critical care time was exclusive of:  Separately billable procedures and treating other patients   Critical care was necessary to treat or prevent imminent or life-threatening deterioration of the following conditions:  Renal failure, metabolic crisis, dehydration and cardiac failure   Critical care was time spent personally by me on the following activities:  Development of treatment plan with patient or surrogate, discussions with consultants, evaluation of patient's response to treatment, examination of patient, ordering and review of laboratory studies, ordering and review of radiographic studies, ordering and performing treatments and interventions, pulse oximetry, re-evaluation of patient's condition and review of old charts   I assumed direction of critical care for this patient from another provider in my specialty: no     Care discussed with: admitting provider       Medications Ordered in ED Medications  insulin aspart (novoLOG) injection 5 Units (5 Units Intravenous Given 01/27/23 1927)    And  dextrose 50 % solution 50 mL (50 mLs Intravenous Given 01/27/23 1927)  albuterol (PROVENTIL) (2.5 MG/3ML) 0.083% nebulizer solution 10 mg (10 mg Nebulization Given 01/27/23 1911)  sodium chloride 0.9 % bolus 500 mL (500 mLs Intravenous New Bag/Given 01/27/23 1931)    ED Course/ Medical Decision Making/  A&P                             Medical Decision Making Amount and/or Complexity of Data Reviewed Labs: ordered.  Risk OTC drugs. Prescription drug management. Decision regarding hospitalization.   88 year old male presents emergency department with nausea/vomiting, inability to tolerate any p.o. and weakness.  Well-appearing on arrival, stable vitals.  EKG shows atrial fibrillation with a left bundle branch block which is baseline for the patient.  Concerning is renal failure on baseline CKD with hyperkalemia, not hemolyzed.  Repeat potassium is genuine with a level of 6.6.  Slight T wave peaking on EKG.  Treating hyperkalemia per protocol.  Troponin elevated at 130, baseline in the 50s.  Most likely secondary to renal disease, no active chest pain and no acute EKG changes.  No significant leukocytosis, liver/lipase lab evaluation is normal.  No active vomiting here in department.  Consulted with nephrologist, Dr. Hollie Salk who agrees with treatment plan for hyperkalemia, advises bicarb fluid and admission to [facility].  Spoke with on-call cardiology fellow, Dr. Conley Canal who agrees with troponin most likely being secondary to renal disease.  In the absence of acute chest pain and EKG changes no plan to give heparin.  Repeat blood work shows normalized potassium and improved kidney function, we are still pending delta troponin.  However unless significant change will not consider heparin it.  Respiratory panel is negative chest x-ray shows a small pleural effusion versus possible infection but no other acute findings of CHF, BNP is around baseline.  No fever or cough.  No lower leg swelling.  Patients evaluation and results requires admission for further treatment and care.  Spoke with hospitalist, reviewed patient's ED course and they accept admission.  Patient agrees with admission plan, offers no new complaints and is stable/unchanged at time of admit.        Final Clinical  Impression(s) / ED Diagnoses Final diagnoses:  None    Rx / DC Orders ED Discharge Orders     None  Lorelle Gibbs, DO 01/27/23 2148

## 2023-01-27 NOTE — ED Notes (Signed)
Report called to nurse Mindy at [facility]. No further questions at this time.

## 2023-01-27 NOTE — Telephone Encounter (Signed)
Pt c/o Shortness Of Breath: STAT if SOB developed within the last 24 hours or pt is noticeably SOB on the phone  1. Are you currently SOB (can you hear that pt is SOB on the phone)? No   2. How long have you been experiencing SOB? Since yesterday   3. Are you SOB when sitting or when up moving around? Mostly moving around    4. Are you currently experiencing any other symptoms?  Loss of appetite, vomiting

## 2023-01-27 NOTE — Progress Notes (Signed)
Cardiac Individual Treatment Plan  Patient Details  Name: Douglas Matthews MRN: [id number] Date of Birth: Mar 12, 1935 Referring Provider:   Flowsheet Row CARDIAC REHAB PHASE II ORIENTATION from 12/24/2022 in [facility] for Heart, Vascular, & [place]  Referring Provider [name], MD       Initial Encounter Date:  [place] PHASE II ORIENTATION from 12/24/2022 in [facility] for Heart, Vascular, & [place]  Date 12/24/22       Visit Diagnosis: Heart failure, chronic systolic ([place])  Patient's Home Medications on Admission:  Current Outpatient Medications:    apixaban (ELIQUIS) 2.5 MG TABS tablet, Take 1 tablet (2.5 mg total) by mouth 2 (two) times daily., Disp: 180 tablet, Rfl: 1   COD LIVER OIL PO, Take 1 tablet by mouth daily., Disp: , Rfl:    furosemide (LASIX) 40 MG tablet, Take 1 tablet (40 mg total) by mouth daily as needed for edema or fluid (as needed in case of weight gain 2 to 3 lbs in 24 hrs or 5 lbs in 7 days, leg swelling or shortness of breath.)., Disp: 30 tablet, Rfl: 3   GARLIC PO, Take 1 tablet by mouth daily., Disp: , Rfl:    hydrALAZINE (APRESOLINE) 10 MG tablet, Take 1 tablet (10 mg total) by mouth 3 (three) times daily., Disp: 270 tablet, Rfl: 1   isosorbide mononitrate (IMDUR) 30 MG 24 hr tablet, Take 1 tablet (30 mg total) by mouth daily., Disp: 90 tablet, Rfl: 1   metoprolol succinate (TOPROL XL) 25 MG 24 hr tablet, Take 3 tablets (75 mg total) by mouth daily., Disp: 270 tablet, Rfl: 3   Multiple Vitamin (MULTIVITAMIN) capsule, Take 1 capsule by mouth daily., Disp: , Rfl:    Saw Palmetto 450 MG CAPS, Take 900 mg by mouth daily., Disp: , Rfl:   Past Medical History: Past Medical History:  Diagnosis Date   Anemia    BPH (benign prostatic hypertrophy)    Diverticulosis    Hyperlipidemia    Hypertension     Tobacco Use: Social History   Tobacco Use  Smoking Status Never   Smokeless Tobacco Never    Labs: Review Flowsheet  More data exists      Latest Ref Rng & Units 04/17/2017 04/19/2018 04/29/2019 04/16/2020 05/08/2021  Labs for ITP Cardiac and Pulmonary Rehab  Cholestrol 0 - 200 mg/dL 292  242  307  310  259   LDL (calc) 0 - 99 mg/dL 217  183  228  236  182   HDL-C >39.00 mg/dL 48.70  44.80  57.80  51.40  59.40   Trlycerides 0.0 - 149.0 mg/dL 130.0  70.0  103.0  117.0  87.0     Capillary Blood Glucose: Lab Results  Component Value Date   GLUCAP 133 (H) 10/25/2022   GLUCAP 110 (H) 10/25/2022   GLUCAP 93 10/25/2022     Exercise Target Goals: Exercise Program Goal: Individual exercise prescription set using results from initial 6 min walk test and THRR while considering  patient's activity barriers and safety.   Exercise Prescription Goal: Initial exercise prescription builds to 30-45 minutes [name] of aerobic activity, 2-3 days per week.  Home exercise guidelines will be given to patient during program as part of exercise prescription that the participant will acknowledge.  Activity Barriers & Risk Stratification:  Activity Barriers & Cardiac Risk Stratification - 12/24/22 1424       Activity Barriers & Cardiac Risk  Stratification   Activity Barriers Balance Concerns;Decreased Ventricular Function;Deconditioning    Cardiac Risk Stratification High   <5 METS on 6MWT            6 Minute Walk:  6 Minute Walk     Row Name 12/24/22 1420         6 Minute Walk   Phase Initial     Distance 630 feet     Walk Time 6 minutes     # of Rest Breaks 2  break from 1:15-1:50, 3:10-4:30 due to high HR     MPH 1.19     METS 1.5     RPE 11     Perceived Dyspnea  0     VO2 Peak 5.39     Symptoms No     Resting HR 84 bpm     Resting BP 116/64     Resting Oxygen Saturation  100 %     Exercise Oxygen Saturation  during 6 min walk 97 %     Max Ex. HR 129 bpm     Max Ex. BP 138/64     2 Minute Post BP 128/68              Oxygen Initial  Assessment:   Oxygen Re-Evaluation:   Oxygen Discharge (Final Oxygen Re-Evaluation):   Initial Exercise Prescription:  Initial Exercise Prescription - 12/24/22 1400       Date of Initial Exercise RX and Referring Provider   Date 12/24/22    Referring Provider [name], MD    Expected Discharge Date 02/23/23      NuStep   Level 1    SPM 70    Minutes 25    METs 1.5      Prescription Details   Frequency (times per week) 3    Duration Progress to 30 minutes of continuous aerobic without signs/symptoms of physical distress      Intensity   THRR 40-80% of Max Heartrate 54-108    Ratings of Perceived Exertion 11-13    Perceived Dyspnea 0-4      Progression   Progression Continue to progress workloads to maintain intensity without signs/symptoms of physical distress.      Resistance Training   Training Prescription Yes    Weight 2    Reps 10-15             Perform Capillary Blood Glucose checks as needed.  Exercise Prescription Changes:   Exercise Prescription Changes     Row Name 12/31/22 1600 01/16/23 1621           Response to Exercise   Blood Pressure (Admit) 102/62 130/72      Blood Pressure (Exercise) 138/80 108/60      Blood Pressure (Exit) 124/70 120/80      Heart Rate (Admit) 95 bpm 81 bpm      Heart Rate (Exercise) 120 bpm 108 bpm      Heart Rate (Exit) 102 bpm 90 bpm      Rating of Perceived Exertion (Exercise) 11 11      Perceived Dyspnea (Exercise) 0 0      Symptoms none none      Comments Pt first Matthews in CRP2 program Reviewed MET, goals and home Ex      Duration Progress to 30 minutes of  aerobic without signs/symptoms of physical distress Progress to 30 minutes of  aerobic without signs/symptoms of physical distress      Intensity THRR unchanged THRR  unchanged        Progression   Progression Continue to progress workloads to maintain intensity without signs/symptoms of physical distress. Continue to progress workloads to maintain  intensity without signs/symptoms of physical distress.      Average METs 2 2.2        Resistance Training   Training Prescription Yes Yes      Weight 2 2      Reps 10-15 10-15      Time 1 Minutes 10 Minutes        NuStep   Level 1 1      SPM 70 80      Minutes 25 30      METs 2 2.2               Exercise Comments:   Exercise Comments     Row Name 12/31/22 1634 01/16/23 1626         Exercise Comments Pt first Matthews in CRP2 program. Pt tolerated exercise well with an average MET level of 2.0. Pt is learning his THRR, ExRx, and RPE scale. Will continue to monitor and progress workloads as tolerated without s/sx. Reviewed MET's, goals and home ExRx. Pt tolerated exercise well with an average MET level of 2.2 and Matthews now increased from 25-30 mins on teh Nustep. Pt will exercise by walking 1-2 days for 10-30 mins as tolerated. Pt feels good about his goals of gaining, strength, stamina and adding flavor with his diet.               Exercise Goals and Review:   Exercise Goals     Row Name 12/31/22 1633             Exercise Goals   Increase Physical Activity Yes       Intervention Provide advice, education, support and counseling about physical activity/exercise needs.;Develop an individualized exercise prescription for aerobic and resistive training based on initial evaluation findings, risk stratification, comorbidities and participant's personal goals.       Expected Outcomes Short Term: Attend rehab on a regular basis to increase amount of physical activity.;Long Term: Exercising regularly at least 3-5 days a week.;Long Term: Add in home exercise to make exercise part of routine and to increase amount of physical activity.       Increase Strength and Stamina Yes       Intervention Provide advice, education, support and counseling about physical activity/exercise needs.;Develop an individualized exercise prescription for aerobic and resistive training based on initial  evaluation findings, risk stratification, comorbidities and participant's personal goals.       Expected Outcomes Short Term: Perform resistance training exercises routinely during rehab and add in resistance training at home;Short Term: Increase workloads from initial exercise prescription for resistance, speed, and METs.;Long Term: Improve cardiorespiratory fitness, muscular endurance and strength as measured by increased METs and functional capacity (6MWT)       Able to understand and use rate of perceived exertion (RPE) scale Yes       Intervention Provide education and explanation on how to use RPE scale       Expected Outcomes Short Term: Able to use RPE daily in rehab to express subjective intensity level;Long Term:  Able to use RPE to guide intensity level when exercising independently       Knowledge and understanding of Target Heart Rate Range (THRR) Yes       Intervention Provide education and explanation of THRR including how the numbers were  predicted and where they are located for reference       Expected Outcomes Short Term: Able to state/look up THRR;Long Term: Able to use THRR to govern intensity when exercising independently;Short Term: Able to use daily as guideline for intensity in rehab       Understanding of Exercise Prescription Yes       Intervention Provide education, explanation, and written materials on patient's individual exercise prescription       Expected Outcomes Short Term: Able to explain program exercise prescription;Long Term: Able to explain home exercise prescription to exercise independently                Exercise Goals Re-Evaluation :  Exercise Goals Re-Evaluation     Row Name 12/31/22 1633 01/16/23 1623           Exercise Goal Re-Evaluation   Exercise Goals Review Increase Physical Activity;Understanding of Exercise Prescription;Increase Strength and Stamina;Knowledge and understanding of Target Heart Rate Range (THRR);Able to understand and use  rate of perceived exertion (RPE) scale Increase Physical Activity;Understanding of Exercise Prescription;Increase Strength and Stamina;Knowledge and understanding of Target Heart Rate Range (THRR);Able to understand and use rate of perceived exertion (RPE) scale      Comments Pt first Matthews in CRP2 program. Pt tolerated exercise well with an average MET level of 2.0, no s/sx. Pt is learning his THRR, RPE scale, and exrx. Reviewed MET's, goals and home ExRx. Pt tolerated exercise well with an average MET level of 2.2 and Matthews now increased from 25-30 mins on teh Nustep. Pt will exercise by walking 1-2 days for 10-30 mins as tolerated. Pt feels good about his goals of gaining, strength, stamina and adding flavor with his diet.      Expected Outcomes Will continue to monitor and progress workloads as tolerated without s/sx. Pt will add in 1-2 days of exercise. Will continue to monitor and progress workloads as tolerated without s/sx.               Discharge Exercise Prescription (Final Exercise Prescription Changes):  Exercise Prescription Changes - 01/16/23 1621       Response to Exercise   Blood Pressure (Admit) 130/72    Blood Pressure (Exercise) 108/60    Blood Pressure (Exit) 120/80    Heart Rate (Admit) 81 bpm    Heart Rate (Exercise) 108 bpm    Heart Rate (Exit) 90 bpm    Rating of Perceived Exertion (Exercise) 11    Perceived Dyspnea (Exercise) 0    Symptoms none    Comments Reviewed MET, goals and home Ex    Duration Progress to 30 minutes of  aerobic without signs/symptoms of physical distress    Intensity THRR unchanged      Progression   Progression Continue to progress workloads to maintain intensity without signs/symptoms of physical distress.    Average METs 2.2      Resistance Training   Training Prescription Yes    Weight 2    Reps 10-15    Time 10 Minutes      NuStep   Level 1    SPM 80    Minutes 30    METs 2.2             Nutrition:  Target Goals:  Understanding of nutrition guidelines, daily intake of sodium '1500mg'$ , cholesterol '200mg'$ , calories 30% from fat and 7% or less from saturated fats, daily to have 5 or more servings of fruits and vegetables.  Biometrics:  Pre  Biometrics - 12/24/22 1418       Pre Biometrics   Waist Circumference 42 inches    Hip Circumference 39 inches    Waist to Hip Ratio 1.08 %    Triceps Skinfold 10 mm    % Body Fat 25.9 %    Grip Strength 30 kg    Flexibility 10.5 in    Single Leg Stand 3.75 seconds              Nutrition Therapy Plan and Nutrition Goals:  Nutrition Therapy & Goals - 01/02/23 1541       Nutrition Therapy   Diet Heart Healthy/Renal diet      Personal Nutrition Goals   Nutrition Goal Patient to identify strategies for improving cardiovascular by attending the weekly Pritikin education and nutrition series    Personal Goal #2 Patient to identify food sources and limit daily intake of saturated fats, sodium, refined carbohydrates, and trans fats    Personal Goal #3 Patient to reduce sodium to '1500mg'$  per Matthews.    Personal Goal #4 Patient to identify strategies for weight gain of 0.5-2.0#.    Comments Douglas Matthews medical history of afib, congestive heart failure, HTN, CKD. His lipids remain elevated but per documentation he continues to decline statin intervention. He reports some decreased appetite. He does report motivation to gain weight; we discussed strategies for weight gain including increasing eating frequency and introducing supplements  (ex. Nepro 420kcals, 19g). He does continue regular follow-up with outside nephrology every 86month; he Matthews been given educational resources from nephrology on reducing high potassium foods. Gave additional resources on reducing high potassium foods. Patient will continue to benefit from participation in intensive cardiac rehab for nutrition,exercise, and lifestyle modification.      Intervention Plan   Intervention Prescribe, educate and  counsel regarding individualized specific dietary modifications aiming towards targeted core components such as weight, hypertension, lipid management, diabetes, heart failure and other comorbidities.;Nutrition handout(s) given to patient.    Expected Outcomes Short Term Goal: Understand basic principles of dietary content, such as calories, fat, sodium, cholesterol and nutrients.;Long Term Goal: Adherence to prescribed nutrition plan.             Nutrition Assessments:  Nutrition Assessments - 01/02/23 0846       Rate Your Plate Scores   Pre Score 61            MEDIFICTS Score Key: ?70 Need to make dietary changes  40-70 Heart Healthy Diet ? 40 Therapeutic Level Cholesterol Diet   Flowsheet Row [facility]  Picture Your Plate Total Score on Admission 61      Picture Your Plate Scores: <[id number] dietary pattern with much room for improvement. 41-50 Dietary pattern unlikely to meet recommendations for good health and room for improvement. 51-60 More healthful dietary pattern, with some room for improvement.  >60 Healthy dietary pattern, although there may be some specific behaviors that could be improved.    Nutrition Goals Re-Evaluation:  Nutrition Goals Re-Evaluation     [place]villeName 01/02/23 1541             Goals   Current Weight 145 lb 1 oz (65.8 kg)       Comment cholesterol 259, LDL 182, GFR 22, Potassium 5.5, Cr 2.71       Expected Outcome JKantrellhas medical history of afib, congestive heart failure, HTN, CKD. His lipids  remain elevated but per documentation he continues to decline statin intervention. He reports some decreased appetite. He does report motivation to gain weight; we discussed strategies for weight gain including increasing eating frequency and introducing supplements (ex. Nepro 420kcals, 19g). He does continue regular follow-up with outside  nephrology every 7month; he Matthews been given educational resources from nephrology on reducing high potassium foods. Gave additional resources on reducing high potassium foods. Patient will continue to benefit from participation in intensive cardiac rehab for nutrition,exercise, and lifestyle modification.                Nutrition Goals Re-Evaluation:  Nutrition Goals Re-Evaluation     RPenfieldName 01/02/23 1541             Goals   Current Weight 145 lb 1 oz (65.8 kg)       Comment cholesterol 259, LDL 182, GFR 22, Potassium 5.5, Cr 2.71       Expected Outcome JQuadrihas medical history of afib, congestive heart failure, HTN, CKD. His lipids remain elevated but per documentation he continues to decline statin intervention. He reports some decreased appetite. He does report motivation to gain weight; we discussed strategies for weight gain including increasing eating frequency and introducing supplements (ex. Nepro 420kcals, 19g). He does continue regular follow-up with outside nephrology every 663month he Matthews been given educational resources from nephrology on reducing high potassium foods. Gave additional resources on reducing high potassium foods. Patient will continue to benefit from participation in intensive cardiac rehab for nutrition,exercise, and lifestyle modification.                Nutrition Goals Discharge (Final Nutrition Goals Re-Evaluation):  Nutrition Goals Re-Evaluation - 01/02/23 1541       Goals   Current Weight 145 lb 1 oz (65.8 kg)    Comment cholesterol 259, LDL 182, GFR 22, Potassium 5.5, Cr 2.71    Expected Outcome JaBorysas medical history of afib, congestive heart failure, HTN, CKD. His lipids remain elevated but per documentation he continues to decline statin intervention. He reports some decreased appetite. He does report motivation to gain weight; we discussed strategies for weight gain including increasing eating frequency and introducing supplements (ex.  Nepro 420kcals, 19g). He does continue regular follow-up with outside nephrology every 16m32monthhe Matthews been given educational resources from nephrology on reducing high potassium foods. Gave additional resources on reducing high potassium foods. Patient will continue to benefit from participation in intensive cardiac rehab for nutrition,exercise, and lifestyle modification.             Psychosocial: Target Goals: Acknowledge presence or absence of significant depression and/or stress, maximize coping skills, provide positive support system. Participant is able to verbalize types and ability to use techniques and skills needed for reducing stress and depression.  Initial Review & Psychosocial Screening:  Initial Psych Review & Screening - 12/24/22 1430       Initial Review   Current issues with None Identified      Family Dynamics   Good Support System? Yes   JamWojciechs his wife for support   Comments Douglas Matthews he Matthews no current feelings of depression, anxiety, or stress.      Barriers   Psychosocial barriers to participate in program There are no identifiable barriers or psychosocial needs.      Screening Interventions   Interventions Encouraged to exercise;Provide feedback about the scores to participant    Expected Outcomes Short Term goal: Identification and review  with participant of any Quality of Life or Depression concerns found by scoring the questionnaire.;Long Term goal: The participant improves quality of Life and PHQ9 Scores as seen by post scores and/or verbalization of changes             Quality of Life Scores:  Quality of Life - 12/24/22 1432       Quality of Life   Select Quality of Life      Quality of Life Scores   Health/Function Pre 26.4 %    Socioeconomic Pre 28.8 %    Psych/Spiritual Pre 29.14 %    Family Pre 27.6 %    GLOBAL Pre 27.56 %            Scores of 19 and below usually indicate a poorer quality of life in these areas.  A  difference of  2-3 points is a clinically meaningful difference.  A difference of 2-3 points in the total score of the Quality of Life Index Matthews been associated with significant improvement in overall quality of life, self-image, physical symptoms, and general health in studies assessing change in quality of life.  PHQ-9: Review Flowsheet  More data exists      12/24/2022 07/08/2022 06/10/2022 12/11/2021 07/02/2021  Depression screen PHQ 2/9  Decreased Interest 0 0 0 0 0  Down, Depressed, Hopeless 0 0 0 0 0  PHQ - 2 Score 0 0 0 0 0  Altered sleeping 0 - - - -  Tired, decreased energy 1 - - - -  Change in appetite 0 - - - -  Feeling bad or failure about yourself  0 - - - -  Trouble concentrating 0 - - - -  Moving slowly or fidgety/restless 0 - - - -  Suicidal thoughts 0 - - - -  PHQ-9 Score 1 - - - -  Difficult doing work/chores Not difficult at all - - - -   Interpretation of Total Score  Total Score Depression Severity:  1-4 = Minimal depression, 5-9 = Mild depression, 10-14 = Moderate depression, 15-19 = Moderately severe depression, 20-27 = Severe depression   Psychosocial Evaluation and Intervention:   Psychosocial Re-Evaluation:  Psychosocial Re-Evaluation     Row Name 01/27/23 1125             Psychosocial Re-Evaluation   Current issues with None Identified       Interventions Encouraged to attend Cardiac Rehabilitation for the exercise       Continue Psychosocial Services  No Follow up required                Psychosocial Discharge (Final Psychosocial Re-Evaluation):  Psychosocial Re-Evaluation - 01/27/23 1125       Psychosocial Re-Evaluation   Current issues with None Identified    Interventions Encouraged to attend Cardiac Rehabilitation for the exercise    Continue Psychosocial Services  No Follow up required             Vocational Rehabilitation: Provide vocational rehab assistance to qualifying candidates.   Vocational Rehab Evaluation &  Intervention:  Vocational Rehab - 12/24/22 1449       Initial Vocational Rehab Evaluation & Intervention   Assessment shows need for Vocational Rehabilitation No   Aquilla is retired and does not need vocational rehab at this time            Education: Education Goals: Education classes will be provided on a weekly basis, covering required topics. Participant will state understanding/return demonstration  of topics presented.    Education     Row Name 12/31/22 1600     Education   Cardiac Education Topics Pritikin   Financial trader   Weekly Topic Efficiency Cooking - Meals in a Snap   Instruction Review Code 1- Verbalizes Understanding   Class Start Time [id number]   Class Stop Time 1437   Class Time Calculation (min) 42 min    [place] Name 01/02/23 1500     Education   Cardiac Education Topics Pritikin   Academic librarian Exercise Education   Exercise Education Move It!   Instruction Review Code 1- Verbalizes Understanding   Class Start Time 1405   Class Stop Time 1442   Class Time Calculation (min) 37 min    Row Name 01/05/23 1500     Education   Cardiac Education Topics Pritikin   Select Core Videos     Core Videos   Educator Dietitian   Select Nutrition   Nutrition Calorie Density   Instruction Review Code 1- Verbalizes Understanding   Class Start Time 1400   Class Stop Time 1447   Class Time Calculation (min) 47 min    [place] Name 01/07/23 1500     Education   Cardiac Education Topics Pritikin   Financial trader   Weekly Topic One-Pot Wonders   Instruction Review Code 1- Verbalizes Understanding   Class Start Time 1400   Class Stop Time 1450   Class Time Calculation (min) 50 min    [place] Name 01/09/23 1600     Education   Cardiac Education Topics Pritikin   Research officer, political party Education   General Education Hypertension and Heart Disease   Instruction Review Code 1- Verbalizes Understanding   Class Start Time 1410   Class Stop Time 1450   Class Time Calculation (min) 40 min    [place] Name 01/12/23 1500     Education   Cardiac Education Topics Pritikin   Environmental consultant Psychosocial   Psychosocial Workshop Healthy Sleep for a Healthy Heart   Instruction Review Code 1- Verbalizes Understanding   Class Start Time 1400   Class Stop Time 1450   Class Time Calculation (min) 50 min    [place] Name 01/16/23 1500     Education   Cardiac Education Topics Pritikin   Lexicographer Nutrition   Nutrition Dining Out - Part 1   Instruction Review Code 1- Verbalizes Understanding   Class Start Time 1405   Class Stop Time 1440   Class Time Calculation (min) 35 min    [place] Name 01/19/23 1600     Education   Cardiac Education Topics Pritikin   Select Core Videos     Core Videos   Educator Exercise Physiologist   Select Exercise Education   Exercise Education Biomechanial Limitations   Instruction Review Code 1- Verbalizes Understanding   Class Start Time 1406   Class Stop Time 1442   Class Time Calculation (min) 36 min    [place] Name 01/21/23 1600     Education  Cardiac Education Topics Pritikin   Financial trader   Weekly Topic Fast Evening Meals   Instruction Review Code 1- Verbalizes Understanding   Class Start Time 1400   Class Stop Time 1445   Class Time Calculation (min) 45 min            Core Videos: Exercise    Move It!  Clinical staff conducted group or individual video education with verbal and written material and guidebook.  Patient learns the recommended Pritikin exercise program. Exercise with the goal of living a long, healthy  life. Some of the health benefits of exercise include controlled diabetes, healthier blood pressure levels, improved cholesterol levels, improved heart and lung capacity, improved sleep, and better body composition. Everyone should speak with their doctor before starting or changing an exercise routine.  Biomechanical Limitations Clinical staff conducted group or individual video education with verbal and written material and guidebook.  Patient learns how biomechanical limitations can impact exercise and how we can mitigate and possibly overcome limitations to have an impactful and balanced exercise routine.  Body Composition Clinical staff conducted group or individual video education with verbal and written material and guidebook.  Patient learns that body composition (ratio of muscle mass to fat mass) is a key component to assessing overall fitness, rather than body weight alone. Increased fat mass, especially visceral belly fat, can put [place] at increased risk for metabolic syndrome, type 2 diabetes, heart disease, and even death. It is recommended to combine diet and exercise (cardiovascular and resistance training) to improve your body composition. Seek guidance from your physician and exercise physiologist before implementing an exercise routine.  Exercise Action Plan Clinical staff conducted group or individual video education with verbal and written material and guidebook.  Patient learns the recommended strategies to achieve and enjoy long-term exercise adherence, including variety, self-motivation, self-efficacy, and positive decision making. Benefits of exercise include fitness, good health, weight management, more energy, better sleep, less stress, and overall well-being.  Medical   Heart Disease Risk Reduction Clinical staff conducted group or individual video education with verbal and written material and guidebook.  Patient learns our heart is our most vital organ as it circulates  oxygen, nutrients, white blood cells, and hormones throughout the entire body, and carries waste away. Data supports a plant-based eating plan like the Pritikin Program for its effectiveness in slowing progression of and reversing heart disease. The video provides a number of recommendations to address heart disease.   Metabolic Syndrome and Belly Fat  Clinical staff conducted group or individual video education with verbal and written material and guidebook.  Patient learns what metabolic syndrome is, how it leads to heart disease, and how one can reverse it and keep it from coming back. You have metabolic syndrome if you have 3 of the following 5 criteria: abdominal obesity, high blood pressure, high triglycerides, low HDL cholesterol, and high blood sugar.  Hypertension and Heart Disease Clinical staff conducted group or individual video education with verbal and written material and guidebook.  Patient learns that high blood pressure, or hypertension, is very common in the [place]. Hypertension is largely due to excessive salt intake, but other important risk factors include being overweight, physical inactivity, drinking too much alcohol, smoking, and not eating enough potassium from fruits and vegetables. High blood pressure is a leading risk factor for heart attack, stroke, congestive heart failure, dementia, kidney failure, and premature death. Long-term effects of  excessive salt intake include stiffening of the arteries and thickening of heart muscle and organ damage. Recommendations include ways to reduce hypertension and the risk of heart disease.  Diseases of Our Time - Focusing on Diabetes Clinical staff conducted group or individual video education with verbal and written material and guidebook.  Patient learns why the best way to stop diseases of our time is prevention, through food and other lifestyle changes. Medicine (such as prescription pills and surgeries) is often only a  Band-Aid on the problem, not a long-term solution. Most common diseases of our time include obesity, type 2 diabetes, hypertension, heart disease, and cancer. The Pritikin Program is recommended and [place] been proven to help reduce, reverse, and/or prevent the damaging effects of metabolic syndrome.  Nutrition   Overview of the Pritikin Eating Plan  Clinical staff conducted group or individual video education with verbal and written material and guidebook.  Patient learns about the [place] for disease risk reduction. The [place] emphasizes a wide variety of unrefined, minimally-processed carbohydrates, like fruits, vegetables, whole grains, and legumes. Go, Caution, and Stop food choices are explained. Plant-based and lean animal proteins are emphasized. Rationale provided for low sodium intake for blood pressure control, low added sugars for blood sugar stabilization, and low added fats and oils for coronary artery disease risk reduction and weight management.  Calorie Density  Clinical staff conducted group or individual video education with verbal and written material and guidebook.  Patient learns about calorie density and how it impacts the Pritikin Eating Plan. Knowing the characteristics of the food you choose will help you decide whether those foods will lead to weight gain or weight loss, and whether you want to consume more or less of them. Weight loss is usually a side effect of the Pritikin Eating Plan because of its focus on low calorie-dense foods.  Label Reading  Clinical staff conducted group or individual video education with verbal and written material and guidebook.  Patient learns about the Pritikin recommended label reading guidelines and corresponding recommendations regarding calorie density, added sugars, sodium content, and whole grains.  Dining Out - Part 1  Clinical staff conducted group or individual video education with verbal and written material  and guidebook.  Patient learns that restaurant meals can be sabotaging because they can be so high in calories, fat, sodium, and/or sugar. Patient learns recommended strategies on how to positively address this and avoid unhealthy pitfalls.  Facts on Fats  Clinical staff conducted group or individual video education with verbal and written material and guidebook.  Patient learns that lifestyle modifications can be just as effective, if not more so, as many medications for lowering your risk of heart disease. A Pritikin lifestyle can help to reduce your risk of inflammation and atherosclerosis (cholesterol build-up, or plaque, in the artery walls). Lifestyle interventions such as dietary choices and physical activity address the cause of atherosclerosis. A review of the types of fats and their impact on blood cholesterol levels, along with dietary recommendations to reduce fat intake is also included.  Nutrition Action Plan  Clinical staff conducted group or individual video education with verbal and written material and guidebook.  Patient learns how to incorporate Pritikin recommendations into their lifestyle. Recommendations include planning and keeping personal health goals in mind as an important part of their success.  Healthy Mind-Set    Healthy Minds, Bodies, Hearts  Clinical staff conducted group or individual video education with verbal and written material and guidebook.  Patient learns how to identify when they are stressed. Video will discuss the impact of that stress, as well as the many benefits of stress management. Patient will also be introduced to stress management techniques. The way we think, act, and feel [place] an impact on our hearts.  How Our Thoughts Can Heal Our Hearts  Clinical staff conducted group or individual video education with verbal and written material and guidebook.  Patient learns that negative thoughts can cause depression and anxiety. This can result in negative  lifestyle behavior and serious health problems. Cognitive behavioral therapy is an effective method to help control our thoughts in order to change and improve our emotional outlook.  Additional Videos:  Exercise    Improving Performance  Clinical staff conducted group or individual video education with verbal and written material and guidebook.  Patient learns to use a non-linear approach by alternating intensity levels and lengths of time spent exercising to help burn more calories and lose more body fat. Cardiovascular exercise helps improve heart health, metabolism, hormonal balance, blood sugar control, and recovery from fatigue. Resistance training improves strength, endurance, balance, coordination, reaction time, metabolism, and muscle mass. Flexibility exercise improves circulation, posture, and balance. Seek guidance from your physician and exercise physiologist before implementing an exercise routine and learn your capabilities and proper form for all exercise.  Introduction to Yoga  Clinical staff conducted group or individual video education with verbal and written material and guidebook.  Patient learns about yoga, a discipline of the coming together of mind, breath, and body. The benefits of yoga include improved flexibility, improved range of motion, better posture and core strength, increased lung function, weight loss, and positive self-image. Yoga's heart health benefits include lowered blood pressure, healthier heart rate, decreased cholesterol and triglyceride levels, improved immune function, and reduced stress. Seek guidance from your physician and exercise physiologist before implementing an exercise routine and learn your capabilities and proper form for all exercise.  Medical   Aging: Enhancing Your Quality of Life  Clinical staff conducted group or individual video education with verbal and written material and guidebook.  Patient learns key strategies and recommendations  to stay in good physical health and enhance quality of life, such as prevention strategies, having an advocate, securing a [place], and keeping a list of medications and system for tracking them. It also discusses how to avoid risk for bone loss.  Biology of Weight Control  Clinical staff conducted group or individual video education with verbal and written material and guidebook.  Patient learns that weight gain occurs because we consume more calories than we burn (eating more, moving less). Even if your body weight is normal, you may have higher ratios of fat compared to muscle mass. Too much body fat puts you at increased risk for cardiovascular disease, heart attack, stroke, type 2 diabetes, and obesity-related cancers. In addition to exercise, following the [place] can help reduce your risk.  Decoding Lab Results  Clinical staff conducted group or individual video education with verbal and written material and guidebook.  Patient learns that lab test reflects one measurement whose values change over time and are influenced by many factors, including medication, stress, sleep, exercise, food, hydration, pre-existing medical conditions, and more. It is recommended to use the knowledge from this video to become more involved with your lab results and evaluate your numbers to speak with your doctor.   Diseases of Our Time - Overview  Clinical staff conducted  group or individual video education with verbal and written material and guidebook.  Patient learns that according to the CDC, 50% to 70% of chronic diseases (such as obesity, type 2 diabetes, elevated lipids, hypertension, and heart disease) are avoidable through lifestyle improvements including healthier food choices, listening to satiety cues, and increased physical activity.  Sleep Disorders Clinical staff conducted group or individual video education with verbal and written material and  guidebook.  Patient learns how good quality and duration of sleep are important to overall health and well-being. Patient also learns about sleep disorders and how they impact health along with recommendations to address them, including discussing with a physician.  Nutrition  Dining Out - Part 2 Clinical staff conducted group or individual video education with verbal and written material and guidebook.  Patient learns how to plan ahead and communicate in order to maximize their dining experience in a healthy and nutritious manner. Included are recommended food choices based on the type of restaurant the patient is visiting.   Fueling a Best boy conducted group or individual video education with verbal and written material and guidebook.  There is a strong connection between our food choices and our health. Diseases like obesity and type 2 diabetes are very prevalent and are in large-part due to lifestyle choices. The Pritikin Eating Plan provides plenty of food and hunger-curbing satisfaction. It is easy to follow, affordable, and helps reduce health risks.  Menu Workshop  Clinical staff conducted group or individual video education with verbal and written material and guidebook.  Patient learns that restaurant meals can sabotage health goals because they are often packed with calories, fat, sodium, and sugar. Recommendations include strategies to plan ahead and to communicate with the manager, chef, or server to help order a healthier meal.  Planning Your Eating Strategy  Clinical staff conducted group or individual video education with verbal and written material and guidebook.  Patient learns about the [place] and its benefit of reducing the risk of disease. The [place] does not focus on calories. Instead, it emphasizes high-quality, nutrient-rich foods. By knowing the characteristics of the foods, we choose, we can determine their calorie density  and make informed decisions.  Targeting Your Nutrition Priorities  Clinical staff conducted group or individual video education with verbal and written material and guidebook.  Patient learns that lifestyle habits have a tremendous impact on disease risk and progression. This video provides eating and physical activity recommendations based on your personal health goals, such as reducing LDL cholesterol, losing weight, preventing or controlling type 2 diabetes, and reducing high blood pressure.  Vitamins and Minerals  Clinical staff conducted group or individual video education with verbal and written material and guidebook.  Patient learns different ways to obtain key vitamins and minerals, including through a recommended healthy diet. It is important to discuss all supplements you take with your doctor.   Healthy Mind-Set    Smoking Cessation  Clinical staff conducted group or individual video education with verbal and written material and guidebook.  Patient learns that cigarette smoking and tobacco addiction pose a serious health risk which affects millions of people. Stopping smoking will significantly reduce the risk of heart disease, lung disease, and many forms of cancer. Recommended strategies for quitting are covered, including working with your doctor to develop a successful plan.  Culinary   Becoming a Financial trader conducted group or individual video education with verbal and written material and guidebook.  Patient learns that cooking at home can be healthy, cost-effective, quick, and puts them in control. Keys to cooking healthy recipes will include looking at your recipe, assessing your equipment needs, planning ahead, making it simple, choosing cost-effective seasonal ingredients, and limiting the use of added fats, salts, and sugars.  Cooking - Breakfast and Snacks  Clinical staff conducted group or individual video education with verbal and written material  and guidebook.  Patient learns how important breakfast is to satiety and nutrition through the entire [place]. Recommendations include key foods to eat during breakfast to help stabilize blood sugar levels and to prevent overeating at meals later in the [place]. Planning ahead is also a key component.  Cooking - Human resources officer conducted group or individual video education with verbal and written material and guidebook.  Patient learns eating strategies to improve overall health, including an approach to cook more at home. Recommendations include thinking of animal protein as a side on your plate rather than center stage and focusing instead on lower calorie dense options like vegetables, fruits, whole grains, and plant-based proteins, such as beans. Making sauces in large quantities to freeze for later and leaving the skin on your vegetables are also recommended to maximize your experience.  Cooking - Healthy Salads and Dressing Clinical staff conducted group or individual video education with verbal and written material and guidebook.  Patient learns that vegetables, fruits, whole grains, and legumes are the foundations of the [place]. Recommendations include how to incorporate each of these in flavorful and healthy salads, and how to create homemade salad dressings. Proper handling of ingredients is also covered. Cooking - Soups and [employer] - Soups and Desserts Clinical staff conducted group or individual video education with verbal and written material and guidebook.  Patient learns that Pritikin soups and desserts make for easy, nutritious, and delicious snacks and meal components that are low in sodium, fat, sugar, and calorie density, while high in vitamins, minerals, and filling fiber. Recommendations include simple and healthy ideas for soups and desserts.   Overview     The Pritikin Solution Program Overview Clinical staff conducted group or individual video  education with verbal and written material and guidebook.  Patient learns that the results of the [place] Program have been documented in more than 100 articles published in peer-reviewed journals, and the benefits include reducing risk factors for (and, in some cases, even reversing) high cholesterol, high blood pressure, type 2 diabetes, obesity, and more! An overview of the three key pillars of the Pritikin Program will be covered: eating well, doing regular exercise, and having a healthy mind-set.  WORKSHOPS  Exercise: Exercise Basics: Building Your Action Plan Clinical staff led group instruction and group discussion with PowerPoint presentation and patient guidebook. To enhance the learning environment the use of posters, models and videos may be added. At the conclusion of this workshop, patients will comprehend the difference between physical activity and exercise, as well as the benefits of incorporating both, into their routine. Patients will understand the FITT (Frequency, Intensity, Time, and Type) principle and how to use it to build an exercise action plan. In addition, safety concerns and other considerations for exercise and cardiac rehab will be addressed by the presenter. The purpose of this lesson is to promote a comprehensive and effective weekly exercise routine in order to improve patients' overall level of fitness.   Managing Heart Disease: Your Path to a Healthier Heart Clinical staff led group instruction  and group discussion with PowerPoint presentation and patient guidebook. To enhance the learning environment the use of posters, models and videos may be added.At the conclusion of this workshop, patients will understand the anatomy and physiology of the heart. Additionally, they will understand how Pritikin's three pillars impact the risk factors, the progression, and the management of heart disease.  The purpose of this lesson is to provide a high-level overview of the  heart, heart disease, and how the Pritikin lifestyle positively impacts risk factors.  Exercise Biomechanics Clinical staff led group instruction and group discussion with PowerPoint presentation and patient guidebook. To enhance the learning environment the use of posters, models and videos may be added. Patients will learn how the structural parts of their bodies function and how these functions impact their daily activities, movement, and exercise. Patients will learn how to promote a neutral spine, learn how to manage pain, and identify ways to improve their physical movement in order to promote healthy living. The purpose of this lesson is to expose patients to common physical limitations that impact physical activity. Participants will learn practical ways to adapt and manage aches and pains, and to minimize their effect on regular exercise. Patients will learn how to maintain good posture while sitting, walking, and lifting.  Balance Training and Fall Prevention  Clinical staff led group instruction and group discussion with PowerPoint presentation and patient guidebook. To enhance the learning environment the use of posters, models and videos may be added. At the conclusion of this workshop, patients will understand the importance of their sensorimotor skills (vision, proprioception, and the vestibular system) in maintaining their ability to balance as they age. Patients will apply a variety of balancing exercises that are appropriate for their current level of function. Patients will understand the common causes for poor balance, possible solutions to these problems, and ways to modify their physical environment in order to minimize their fall risk. The purpose of this lesson is to teach patients about the importance of maintaining balance as they age and ways to minimize their risk of falling.  WORKSHOPS   Nutrition:  Fueling a Scientist, research (physical sciences) led group instruction and  group discussion with PowerPoint presentation and patient guidebook. To enhance the learning environment the use of posters, models and videos may be added. Patients will review the foundational principles of the [place] and understand what constitutes a serving size in each of the food groups. Patients will also learn Pritikin-friendly foods that are better choices when away from home and review make-ahead meal and snack options. Calorie density will be reviewed and applied to three nutrition priorities: weight maintenance, weight loss, and weight gain. The purpose of this lesson is to reinforce (in a group setting) the key concepts around what patients are recommended to eat and how to apply these guidelines when away from home by planning and selecting Pritikin-friendly options. Patients will understand how calorie density may be adjusted for different weight management goals.  Mindful Eating  Clinical staff led group instruction and group discussion with PowerPoint presentation and patient guidebook. To enhance the learning environment the use of posters, models and videos may be added. Patients will briefly review the concepts of the [place] and the importance of low-calorie dense foods. The concept of mindful eating will be introduced as well as the importance of paying attention to internal hunger signals. Triggers for non-hunger eating and techniques for dealing with triggers will be explored. The purpose of this lesson is  to provide patients with the opportunity to review the basic principles of the [place], discuss the value of eating mindfully and how to measure internal cues of hunger and fullness using the Hunger Scale. Patients will also discuss reasons for non-hunger eating and learn strategies to use for controlling emotional eating.  Targeting Your Nutrition Priorities Clinical staff led group instruction and group discussion with PowerPoint presentation  and patient guidebook. To enhance the learning environment the use of posters, models and videos may be added. Patients will learn how to determine their genetic susceptibility to disease by reviewing their family history. Patients will gain insight into the importance of diet as part of an overall healthy lifestyle in mitigating the impact of genetics and other environmental insults. The purpose of this lesson is to provide patients with the opportunity to assess their personal nutrition priorities by looking at their family history, their own health history and current risk factors. Patients will also be able to discuss ways of prioritizing and modifying the [place] for their highest risk areas  Menu  Clinical staff led group instruction and group discussion with PowerPoint presentation and patient guidebook. To enhance the learning environment the use of posters, models and videos may be added. Using menus brought in from [employer], or printed from [employer], patients will apply the [name] dining out guidelines that were presented in the [employer] video. Patients will also be able to practice these guidelines in a variety of provided scenarios. The purpose of this lesson is to provide patients with the opportunity to practice hands-on learning of the [place] with actual menus and practice scenarios.  Label Reading Clinical staff led group instruction and group discussion with PowerPoint presentation and patient guidebook. To enhance the learning environment the use of posters, models and videos may be added. Patients will review and discuss the [employer]rit[employer] label reading guidelines presented in [employer]'s Label Reading Educational series video. Using fool labels brought in from local grocery stores and markets, patients will apply the label reading guidelines and determine if the packaged food meet the Pritikin guidelines. The purpose of  this lesson is to provide patients with the opportunity to review, discuss, and practice hands-on learning of the [employer] Label Reading guidelines with actual packaged food labels. [place] Workshops are designed to teach patients ways to prepare quick, simple, and affordable recipes at home. The importance of nutrition's role in chronic disease risk reduction is reflected in its emphasis in the overall Prit[employer]in program. By learning how to prepare essential core Pritikin Eating Plan recipes, patients will increase control over what they eat; be able to customize the flavor of foods without the use of added salt, sugar, or fat; and improve the quality of the food they consume. By learning a set of core recipes which are easily assembled, quickly prepared, and affordable, patients are more likely to prepare more healthy foods at home. These workshops focus on convenient breakfasts, simple entres, side dishes, and desserts which can be prepared with minimal effort and are consistent with nutrition recommendations for cardiovascular risk reduction. Cooking [employer] are taught by a Engineer, materials (RD) who Matthews been trained by the [employer]. The chef or RD Matthews a clear understanding of the importance of minimizing - if not completely eliminating - added fat, sugar, and sodium in recipes. Throughout the series of [place] Workshop sessions, patients will learn about healthy  ingredients and efficient methods of cooking to build confidence in their capability to prepare    Cooking School weekly topics:  Adding Flavor- Sodium-Free  Fast and Healthy Breakfasts  Powerhouse Plant-Based Proteins  Satisfying Salads and Dressings  Simple Sides and Sauces  International Cuisine-Spotlight on the [place] Zones  Delicious Desserts  Savory Soups  Efficiency Cooking - Meals in a Snap  Tasty Appetizers and Snacks  Comforting Weekend  Breakfasts  One-Pot Wonders   Fast Evening Meals  Easy Entertaining  Personalizing Your Pritikin Plate  WORKSHOPS   Healthy Mindset (Psychosocial): New Thoughts, New Behaviors Clinical staff led group instruction and group discussion with PowerPoint presentation and patient guidebook. To enhance the learning environment the use of posters, models and videos may be added. Patients will learn and practice techniques for developing effective health and lifestyle goals. Patients will be able to effectively apply the goal setting process learned to develop at least one new personal goal.  The purpose of this lesson is to expose patients to a new skill set of behavior modification techniques such as techniques setting SMART goals, overcoming barriers, and achieving new thoughts and new behaviors.  Managing Moods and Relationships Clinical staff led group instruction and group discussion with PowerPoint presentation and patient guidebook. To enhance the learning environment the use of posters, models and videos may be added. Patients will learn how emotional and chronic stress factors can impact their health and relationships. They will learn healthy ways to manage their moods and utilize positive coping mechanisms. In addition, ICR patients will learn ways to improve communication skills. The purpose of this lesson is to expose patients to ways of understanding how one's mood and health are intimately connected. Developing a healthy outlook can help build positive relationships and connections with others. Patients will understand the importance of utilizing effective communication skills that include actively listening and being heard. They will learn and understand the importance of the "4 Cs" and especially Connections in fostering of a Healthy Mind-Set.  Healthy Sleep for a Healthy Heart Clinical staff led group instruction and group discussion with PowerPoint presentation and patient guidebook. To  enhance the learning environment the use of posters, models and videos may be added. At the conclusion of this workshop, patients will be able to demonstrate knowledge of the importance of sleep to overall health, well-being, and quality of life. They will understand the symptoms of, and treatments for, common sleep disorders. Patients will also be able to identify daytime and nighttime behaviors which impact sleep, and they will be able to apply these tools to help manage sleep-related challenges. The purpose of this lesson is to provide patients with a general overview of sleep and outline the importance of quality sleep. Patients will learn about a few of the most common sleep disorders. Patients will also be introduced to the concept of "sleep hygiene," and discover ways to self-manage certain sleeping problems through simple daily behavior changes. Finally, the workshop will motivate patients by clarifying the links between quality sleep and their goals of heart-healthy living.   Recognizing and Reducing Stress Clinical staff led group instruction and group discussion with PowerPoint presentation and patient guidebook. To enhance the learning environment the use of posters, models and videos may be added. At the conclusion of this workshop, patients will be able to understand the types of stress reactions, differentiate between acute and chronic stress, and recognize the impact that chronic stress Matthews on their health. They will also be able to apply different  coping mechanisms, such as reframing negative self-talk. Patients will have the opportunity to practice a variety of stress management techniques, such as deep abdominal breathing, progressive muscle relaxation, and/or guided imagery.  The purpose of this lesson is to educate patients on the role of stress in their lives and to provide healthy techniques for coping with it.  Learning Barriers/Preferences:  Learning Barriers/Preferences - 12/24/22  1435       Learning Barriers/Preferences   Learning Barriers Sight   wears glasses   Learning Preferences Audio;Computer/Internet;Group Instruction;Individual Instruction;Skilled Demonstration;Pictoral;Verbal Instruction;Video;Written Material             Education Topics:  Knowledge Questionnaire Score:  Knowledge Questionnaire Score - 12/24/22 1448       Knowledge Questionnaire Score   Pre Score 21/24             Core Components/Risk Factors/Patient Goals at Admission:  Personal Goals and Risk Factors at Admission - 12/24/22 1449       Core Components/Risk Factors/Patient Goals on Admission   Heart Failure Yes    Intervention Provide a combined exercise and nutrition program that is supplemented with education, support and counseling about heart failure. Directed toward relieving symptoms such as shortness of breath, decreased exercise tolerance, and extremity edema.    Expected Outcomes Improve functional capacity of life;Short term: Attendance in program 2-3 days a week with increased exercise capacity. Reported lower sodium intake. Reported increased fruit and vegetable intake. Reports medication compliance.;Short term: Daily weights obtained and reported for increase. Utilizing diuretic protocols set by physician.;Long term: Adoption of self-care skills and reduction of barriers for early signs and symptoms recognition and intervention leading to self-care maintenance.    Hypertension Yes    Intervention Monitor prescription use compliance.;Provide education on lifestyle modifcations including regular physical activity/exercise, weight management, moderate sodium restriction and increased consumption of fresh fruit, vegetables, and low fat dairy, alcohol moderation, and smoking cessation.    Expected Outcomes Long Term: Maintenance of blood pressure at goal levels.;Short Term: Continued assessment and intervention until BP is < 140/6m HG in hypertensive participants. <  130/859mHG in hypertensive participants with diabetes, heart failure or chronic kidney disease.    Lipids Yes    Intervention Provide education and support for participant on nutrition & aerobic/resistive exercise along with prescribed medications to achieve LDL '70mg'$ , HDL >'40mg'$ .    Expected Outcomes Short Term: Participant states understanding of desired cholesterol values and is compliant with medications prescribed. Participant is following exercise prescription and nutrition guidelines.;Long Term: Cholesterol controlled with medications as prescribed, with individualized exercise RX and with personalized nutrition plan. Value goals: LDL < '70mg'$ , HDL > 40 mg.             Core Components/Risk Factors/Patient Goals Review:   Goals and Risk Factor Review     Row Name 01/27/23 1131             Core Components/Risk Factors/Patient Goals Review   Personal Goals Review Weight Management/Obesity;Hypertension;Lipids       Review Douglas Matthews been doing well with exercise at intensive cardiac rehab. Douglas Matthews lost 5.2 since starting cardiac rehab. Vital sings have been stable       Expected Outcomes Douglas Matthews continue to participate in intensive cardiac rehab for exercise, nutrition and lifestyle modifciatons                Core Components/Risk Factors/Patient Goals at Discharge (Final Review):   Goals and Risk Factor Review - 01/27/23 1131  Core Components/Risk Factors/Patient Goals Review   Personal Goals Review Weight Management/Obesity;Hypertension;Lipids    Review Douglas Matthews Matthews been doing well with exercise at intensive cardiac rehab. Douglas Matthews Matthews lost 5.2 since starting cardiac rehab. Vital sings have been stable    Expected Outcomes Douglas Matthews will continue to participate in intensive cardiac rehab for exercise, nutrition and lifestyle modifciatons             ITP Comments:  ITP Comments     Row Name 12/24/22 1049 12/31/22 1313 01/27/23 1124       ITP Comments Dr. Fransico Him medical director. Introduction to pritikin education/ intensive cardiac rehab. Initital orientation packet reviewed with patient. 30 Matthews ITP Review, Guinness is scheduled to start cardiac rehab today 12/31/22 30 Matthews ITP Review, Vong Matthews good attendance and participation in intensive cardiac rehab              Comments: See ITP comments.Harrell Gave RN BSN

## 2023-01-27 NOTE — Progress Notes (Signed)
Plan of Care Note for accepted transfer   Patient: Douglas Matthews MRN: [id number]   DOA: 01/27/2023  Facility requesting transfer: [facility]. Requesting Provider: Dr. Dina Rich, [place] Reason for transfer: AKI, hyperkalemia, metabolic acidosis, elevated troponin.  Facility course: The patient is a 88 year old male with past medical history significant for CKD 4 followed by nephrology outpatient, hypertension, persistent atrial fibrillation on Eliquis, who presented to [facility] ED with complaints of decreased oral intake, nausea and vomiting for the past 2-3 days.  Associated with exertional dyspnea and chest discomfort today.  He presented to the ED for further evaluation.  In the ED, workup revealed AKI on CKD 4, hyperkalemia with peak potassium of 6.6, normal anion gap metabolic acidosis.  Nephrology was consulted, EDP discussed the case with nephrology.  Treated in the ED with 1 g IV calcium gluconate, IV insulin along with IV D50, Lokelma, NS bolus, and isotonic bicarb continuous infusion, with improvement of renal function, hyperkalemia and metabolic acidosis.  Lab studies were also remarkable for elevated BNP 856, elevated first set high-sensitivity troponin 130 for which cardiology was consulted.  Second set of troponin is pending.  EDP discussed the case with cardiology fellow on-call, will start heparin drip if the troponin delta is increased.  The patient was admitted by [facility], hospitalist service, at [facility] progressive care unit as inpatient status.  Plan of care: The patient is accepted for admission to Progressive unit, at [facility]. Inpatient status.  Author: Kayleen Memos, DO 01/27/2023  Check [email or web address] for on-call coverage.  Nursing staff, Please call [place] number on Amion as soon as patient's arrival, so appropriate admitting provider can evaluate the pt.

## 2023-01-28 ENCOUNTER — Encounter (HOSPITAL_COMMUNITY): Payer: Self-pay | Admitting: Internal Medicine

## 2023-01-28 ENCOUNTER — Encounter (HOSPITAL_COMMUNITY): Payer: Medicare Other

## 2023-01-28 DIAGNOSIS — N179 Acute kidney failure, unspecified: Principal | ICD-10-CM | POA: Diagnosis present

## 2023-01-28 DIAGNOSIS — N184 Chronic kidney disease, stage 4 (severe): Secondary | ICD-10-CM

## 2023-01-28 DIAGNOSIS — R112 Nausea with vomiting, unspecified: Secondary | ICD-10-CM

## 2023-01-28 DIAGNOSIS — E872 Acidosis, unspecified: Secondary | ICD-10-CM | POA: Diagnosis present

## 2023-01-28 DIAGNOSIS — E785 Hyperlipidemia, unspecified: Secondary | ICD-10-CM

## 2023-01-28 DIAGNOSIS — R110 Nausea: Secondary | ICD-10-CM | POA: Diagnosis present

## 2023-01-28 DIAGNOSIS — E875 Hyperkalemia: Secondary | ICD-10-CM

## 2023-01-28 DIAGNOSIS — I482 Chronic atrial fibrillation, unspecified: Secondary | ICD-10-CM

## 2023-01-28 DIAGNOSIS — I5020 Unspecified systolic (congestive) heart failure: Secondary | ICD-10-CM

## 2023-01-28 LAB — CBC WITH DIFFERENTIAL/PLATELET
Abs Immature Granulocytes: 0.01 10*3/uL (ref 0.00–0.07)
Basophils Absolute: 0 10*3/uL (ref 0.0–0.1)
Basophils Relative: 1 %
Eosinophils Absolute: 0 10*3/uL (ref 0.0–0.5)
Eosinophils Relative: 0 %
HCT: 36 % — ABNORMAL LOW (ref 39.0–52.0)
Hemoglobin: 11.8 g/dL — ABNORMAL LOW (ref 13.0–17.0)
Immature Granulocytes: 0 %
Lymphocytes Relative: 15 %
Lymphs Abs: 0.6 10*3/uL — ABNORMAL LOW (ref 0.7–4.0)
MCH: 29.1 pg (ref 26.0–34.0)
MCHC: 32.8 g/dL (ref 30.0–36.0)
MCV: 88.7 fL (ref 80.0–100.0)
Monocytes Absolute: 0.8 10*3/uL (ref 0.1–1.0)
Monocytes Relative: 19 %
Neutro Abs: 2.6 10*3/uL (ref 1.7–7.7)
Neutrophils Relative %: 65 %
Platelets: 144 10*3/uL — ABNORMAL LOW (ref 150–400)
RBC: 4.06 MIL/uL — ABNORMAL LOW (ref 4.22–5.81)
RDW: 16.2 % — ABNORMAL HIGH (ref 11.5–15.5)
WBC: 4.1 10*3/uL (ref 4.0–10.5)
nRBC: 0 % (ref 0.0–0.2)

## 2023-01-28 LAB — BASIC METABOLIC PANEL
Anion gap: 12 (ref 5–15)
Anion gap: 11 (ref 5–15)
BUN: 101 mg/dL — ABNORMAL HIGH (ref 8–23)
BUN: 96 mg/dL — ABNORMAL HIGH (ref 8–23)
CO2: 16 mmol/L — ABNORMAL LOW (ref 22–32)
CO2: 17 mmol/L — ABNORMAL LOW (ref 22–32)
Calcium: 9 mg/dL (ref 8.9–10.3)
Calcium: 8.7 mg/dL — ABNORMAL LOW (ref 8.9–10.3)
Chloride: 112 mmol/L — ABNORMAL HIGH (ref 98–111)
Chloride: 111 mmol/L (ref 98–111)
Creatinine, Ser: 3.84 mg/dL — ABNORMAL HIGH (ref 0.61–1.24)
Creatinine, Ser: 3.59 mg/dL — ABNORMAL HIGH (ref 0.61–1.24)
GFR, Estimated: 15 mL/min — ABNORMAL LOW (ref >60)
GFR, Estimated: 16 mL/min — ABNORMAL LOW (ref >60)
Glucose, Bld: 93 mg/dL (ref 70–99)
Glucose, Bld: 115 mg/dL — ABNORMAL HIGH (ref 70–99)
Potassium: 5.8 mmol/L — ABNORMAL HIGH (ref 3.5–5.1)
Potassium: 4.8 mmol/L (ref 3.5–5.1)
Sodium: 140 mmol/L (ref 135–145)
Sodium: 139 mmol/L (ref 135–145)

## 2023-01-28 LAB — URINALYSIS, ROUTINE W REFLEX MICROSCOPIC
Bilirubin Urine: NEGATIVE
Glucose, UA: NEGATIVE mg/dL
Hgb urine dipstick: NEGATIVE
Ketones, ur: NEGATIVE mg/dL
Leukocytes,Ua: NEGATIVE
Nitrite: NEGATIVE
Protein, ur: 100 mg/dL — AB
Specific Gravity, Urine: 1.025 (ref 1.005–1.030)
pH: 5.5 (ref 5.0–8.0)

## 2023-01-28 LAB — URINALYSIS, MICROSCOPIC (REFLEX)

## 2023-01-28 MED ORDER — ONDANSETRON HCL 4 MG PO TABS: 4.0000 mg | ORAL_TABLET | Freq: Four times a day (QID) | ORAL | Status: DC | PRN

## 2023-01-28 MED ORDER — METOPROLOL SUCCINATE ER 25 MG PO TB24
75.0000 mg | ORAL_TABLET | Freq: Every day | ORAL | Status: DC
  Administered 2023-01-28 – 2023-01-30 (×3): 75 mg via ORAL
  Filled 2023-01-28 (×3): qty 3

## 2023-01-28 MED ORDER — SODIUM ZIRCONIUM CYCLOSILICATE 10 G PO PACK
10.0000 g | PACK | Freq: Two times a day (BID) | ORAL | Status: DC
  Administered 2023-01-28: 10 g via ORAL
  Filled 2023-01-28 (×2): qty 1

## 2023-01-28 MED ORDER — CALCIUM GLUCONATE-NACL 1-0.675 GM/50ML-% IV SOLN
1.0000 g | Freq: Once | INTRAVENOUS | Status: AC
  Administered 2023-01-28: 1000 mg via INTRAVENOUS
  Filled 2023-01-28: qty 50

## 2023-01-28 MED ORDER — ISOSORBIDE MONONITRATE ER 30 MG PO TB24
30.0000 mg | ORAL_TABLET | Freq: Every day | ORAL | Status: DC
  Administered 2023-01-28 – 2023-01-30 (×3): 30 mg via ORAL
  Filled 2023-01-28 (×3): qty 1

## 2023-01-28 MED ORDER — APIXABAN 2.5 MG PO TABS
2.5000 mg | ORAL_TABLET | Freq: Two times a day (BID) | ORAL | Status: DC
  Administered 2023-01-28 – 2023-01-30 (×5): 2.5 mg via ORAL
  Filled 2023-01-28 (×6): qty 1

## 2023-01-28 MED ORDER — HYDRALAZINE HCL 10 MG PO TABS
10.0000 mg | ORAL_TABLET | Freq: Three times a day (TID) | ORAL | Status: DC
  Administered 2023-01-28 – 2023-01-30 (×8): 10 mg via ORAL
  Filled 2023-01-28 (×8): qty 1

## 2023-01-28 MED ORDER — DEXTROSE 50 % IV SOLN
1.0000 | Freq: Once | INTRAVENOUS | Status: AC
  Administered 2023-01-28: 50 mL via INTRAVENOUS
  Filled 2023-01-28: qty 50

## 2023-01-28 MED ORDER — ACETAMINOPHEN 325 MG PO TABS: 650.0000 mg | ORAL_TABLET | Freq: Four times a day (QID) | ORAL | Status: DC | PRN

## 2023-01-28 MED ORDER — SENNOSIDES-DOCUSATE SODIUM 8.6-50 MG PO TABS: 1.0000 | ORAL_TABLET | Freq: Every evening | ORAL | Status: DC | PRN

## 2023-01-28 MED ORDER — SODIUM BICARBONATE 650 MG PO TABS: 650.0000 mg | ORAL_TABLET | Freq: Two times a day (BID) | ORAL | Status: DC

## 2023-01-28 MED ORDER — ACETAMINOPHEN 650 MG RE SUPP: 650.0000 mg | Freq: Four times a day (QID) | RECTAL | Status: DC | PRN

## 2023-01-28 MED ORDER — ONDANSETRON HCL 4 MG/2ML IJ SOLN: 4.0000 mg | Freq: Four times a day (QID) | INTRAMUSCULAR | Status: DC | PRN

## 2023-01-28 MED ORDER — INSULIN ASPART 100 UNIT/ML IV SOLN
5.0000 [IU] | Freq: Once | INTRAVENOUS | Status: AC
  Administered 2023-01-28: 5 [IU] via INTRAVENOUS

## 2023-01-28 MED ORDER — METOPROLOL SUCCINATE ER 50 MG PO TB24: 75.0000 mg | ORAL_TABLET | Freq: Every day | ORAL | Status: DC

## 2023-01-28 NOTE — Assessment & Plan Note (Addendum)
Suspect this is secondary to prerenal secondary to nausea and vomiting.  Discussed with nephrology and they will hold off on seeing unless renal function declines.  Renal function slowly improved and by morning of 3/1, creatinine down to 3.04.  Patient continued to receive IV fluids throughout the day.  By afternoon, blood pressure stable and patient feeling better and okay to be discharged.

## 2023-01-28 NOTE — Assessment & Plan Note (Addendum)
Some borderline tachycardia.  Continue metoprolol and Eliquis.  Persistent tachycardia likely combination of atrial fibrillation and still with some volume depletion.  Heart rate more so with activity.  Increase Toprol from 75 p.o. daily to 100 p.o. daily.  Discharged with new prescription.  Patient will follow-up with PCP in the next week and at that time, heart rate can be assessed to see if medication needs to be decreased back to home dose.

## 2023-01-28 NOTE — Assessment & Plan Note (Signed)
Blood pressure reported.  Possibly from IV fluids.  Patient states he was able to take his blood pressure medications.

## 2023-01-28 NOTE — Telephone Encounter (Signed)
Returned call to patient, no answer. Left vague message (no DPR on file) advising that we agree with UC follow-up for current issues and to call back if needed.

## 2023-01-28 NOTE — Assessment & Plan Note (Addendum)
Ejection fraction of 30 to 35% as evidenced by TEE 11/29.  Mildly elevated BNP of 860, however this is in the setting of acute atrial fibrillation as well as acute on chronic kidney injury.  Patient if anything is more intravascularly volume depleted.  Lasix resumed on discharge.

## 2023-01-28 NOTE — Assessment & Plan Note (Signed)
Secondary to AKI.  Should improve as renal function improves.  On bicarb drip.  Status post Lokelma.

## 2023-01-28 NOTE — H&P (Signed)
PCP:   [place], [name], MD   Chief Complaint:  Nausea, vomiting, weakness  HPI: This is a 88 year old male with past medical history of CKD stage IV, Atrial fib/ flutter, HLD, HFrEF and HTN.  For the past 3 to 4 days he has been having nausea and vomiting, poor appetite and weakness.  He denies diarrhea, ill contacts, cough or wheeze.  He denies abdominal pain.  His last emesis was yesterday in the morning.  He states his urine output is normal and at his baseline.  He denies fevers, chills or chest pains.  He went to [facility] ER because he was so weak.  In the ER patient potassium was 6, CO2 17, creatinine 4.13.  Baseline creatinine 2.17 [11/11/2022].  He was given insulin, amp of glucose, calcium gluconate, Lokelma and bicarb drip started.  Nephrology was contacted and will see patient in the AM.  Review of Systems:  The patient denies anorexia, fever, weight loss,, vision loss, decreased hearing, hoarseness, chest pain, syncope, dyspnea on exertion, peripheral edema, balance deficits, hemoptysis, abdominal pain, melena, hematochezia, severe indigestion/heartburn, hematuria, incontinence, genital sores, muscle weakness, suspicious skin lesions, transient blindness, difficulty walking, depression, unusual weight change, abnormal bleeding, enlarged lymph nodes, angioedema, and breast masses. Positives: Nausea, vomiting, weakness,  Past Medical History: Past Medical History:  Diagnosis Date   Anemia    BPH (benign prostatic hypertrophy)    Diverticulosis    Hyperlipidemia    Hypertension    Past Surgical History:  Procedure Laterality Date   APPENDECTOMY     CARDIOVERSION N/A 10/29/2022   Procedure: CARDIOVERSION;  Surgeon: Lelon Perla, MD;  Location: [place];  Service: Cardiovascular;  Laterality: N/A;   HERNIA REPAIR     TEE WITHOUT CARDIOVERSION N/A 10/29/2022   Procedure: TRANSESOPHAGEAL ECHOCARDIOGRAM (TEE);  Surgeon: Lelon Perla, MD;  Location: [facility] ENDOSCOPY;  Service:  Cardiovascular;  Laterality: N/A;    Medications: Prior to Admission medications   Medication Sig Start Date End Date Taking? Authorizing Provider  apixaban (ELIQUIS) 2.5 MG TABS tablet Take 1 tablet (2.5 mg total) by mouth 2 (two) times daily. 11/21/22   Nahser, Wonda Cheng, MD  COD LIVER OIL PO Take 1 tablet by mouth daily.    [provider]  furosemide (LASIX) 40 MG tablet Take 1 tablet (40 mg total) by mouth daily as needed for edema or fluid (as needed in case of weight gain 2 to 3 lbs in 24 hrs or 5 lbs in 7 days, leg swelling or shortness of breath.). 11/21/22   Nahser, Wonda Cheng, MD  GARLIC PO Take 1 tablet by mouth daily.    [provider]  hydrALAZINE (APRESOLINE) 10 MG tablet Take 1 tablet (10 mg total) by mouth 3 (three) times daily. 11/21/22   Nahser, Wonda Cheng, MD  isosorbide mononitrate (IMDUR) 30 MG 24 hr tablet Take 1 tablet (30 mg total) by mouth daily. 11/21/22   Nahser, Wonda Cheng, MD  metoprolol succinate (TOPROL XL) 25 MG 24 hr tablet Take 3 tablets (75 mg total) by mouth daily. 12/30/22   Nahser, Wonda Cheng, MD  Multiple Vitamin (MULTIVITAMIN) capsule Take 1 capsule by mouth daily.    [provider]  Saw Palmetto 450 MG CAPS Take 900 mg by mouth daily.    [provider]    Allergies:  No Known Allergies  Social History:  reports that he has never smoked. He has never used smokeless tobacco. He reports that he does not drink alcohol and  does not use drugs.  Family History: Family History  Problem Relation Age of Onset   Heart disease Other     Physical Exam: Vitals:   01/27/23 2212 01/27/23 2213 01/27/23 2214 01/27/23 2257  BP:  (!) 128/105  (!) 131/110  Pulse: 81 (!) 103 (!) 103 (!) 106  Resp: 15 16 (!) 21 18  Temp:    97.9 F (36.6 C)  TempSrc:    Oral  SpO2: 96% 97% 99% 99%  Weight:    62 kg  Height:    '5\' 8"'$  (1.727 m)    General:  Alert and oriented times three, well developed, no acute distress, elderly male,  cachectic Eyes: pink conjunctiva, no scleral icterus ENT: Moist oral mucosa, neck supple, no thyromegaly Lungs: clear to ascultation, no wheeze, no crackles, no use of accessory muscles Cardiovascular: irregular rate and irregular rhythm, no regurgitation, no gallops, no murmurs. No carotid bruits, ++ JVD Abdomen: soft, positive BS, non-tender, non-distended, no organomegaly, not an acute abdomen GU: not examined Neuro: CN II - XII grossly intact, sensation intact Musculoskeletal: strength 5/5 all extremities, no clubbing, cyanosis or edema Skin: no rash, no subcutaneous crepitation, no decubitus Psych: appropriate patient   Labs on Admission:  Recent Labs    01/27/23 1633 01/27/23 1849 01/27/23 1915 01/27/23 2038  NA 139 142  --  141  K 6.0* 5.7* 6.6* 4.9  CL 109  --   --  112*  CO2 17*  --   --  18*  GLUCOSE 133*  --   --  132*  BUN 98*  --   --  97*  CREATININE 4.13*  --   --  3.93*  CALCIUM 9.8  --   --  9.5   Recent Labs    01/27/23 1915  AST 34  ALT 42  ALKPHOS 135*  BILITOT 0.7  PROT 8.1  ALBUMIN 4.5   Recent Labs    01/27/23 1808  LIPASE 19   Recent Labs    01/27/23 1633 01/27/23 1849  WBC 4.7  --   HGB 12.7* 11.2*  HCT 39.1 33.0*  MCV 89.5  --   PLT 172  --      Micro Results: Recent Results (from the past 240 hour(s))  Resp panel by RT-PCR (RSV, Flu A&B, Covid) Anterior Nasal Swab     Status: None   Collection Time: 01/27/23  7:24 PM   Specimen: Anterior Nasal Swab  Result Value Ref Range Status   SARS Coronavirus 2 by RT PCR NEGATIVE NEGATIVE Final    Comment: (NOTE) SARS-CoV-2 target nucleic acids are NOT DETECTED.  The SARS-CoV-2 RNA is generally detectable in upper respiratory specimens during the acute phase of infection. The lowest concentration of SARS-CoV-2 viral copies this assay can detect is 138 copies/mL. A negative result does not preclude SARS-Cov-2 infection and should not be used as the sole basis for treatment or other  patient management decisions. A negative result may occur with  improper specimen collection/handling, submission of specimen other than nasopharyngeal swab, presence of viral mutation(s) within the areas targeted by this assay, and inadequate number of viral copies(<138 copies/mL). A negative result must be combined with clinical observations, patient history, and epidemiological information. The expected result is Negative.  Fact Sheet for Patients:  EntrepreneurPulse.com.au  Fact Sheet for Healthcare Providers:  IncredibleEmployment.be  This test is no t yet approved or cleared by the [place] FDA and  has been authorized for detection and/or diagnosis of SARS-CoV-2  by FDA under an Emergency Use Authorization (EUA). This EUA will remain  in effect (meaning this test can be used) for the duration of the COVID-19 declaration under Section 564(b)(1) of the Act, 21 U.S.C.section 360bbb-3(b)(1), unless the authorization is terminated  or revoked sooner.       Influenza A by PCR NEGATIVE NEGATIVE Final   Influenza B by PCR NEGATIVE NEGATIVE Final    Comment: (NOTE) The Xpert Xpress SARS-CoV-2/FLU/RSV plus assay is intended as an aid in the diagnosis of influenza from Nasopharyngeal swab specimens and should not be used as a sole basis for treatment. Nasal washings and aspirates are unacceptable for Xpert Xpress SARS-CoV-2/FLU/RSV testing.  Fact Sheet for Patients: EntrepreneurPulse.com.au  Fact Sheet for Healthcare Providers: IncredibleEmployment.be  This test is not yet approved or cleared by the [place] FDA and has been authorized for detection and/or diagnosis of SARS-CoV-2 by FDA under an Emergency Use Authorization (EUA). This EUA will remain in effect (meaning this test can be used) for the duration of the COVID-19 declaration under Section 564(b)(1) of the Act, 21 U.S.C. section  360bbb-3(b)(1), unless the authorization is terminated or revoked.     Resp Syncytial Virus by PCR NEGATIVE NEGATIVE Final    Comment: (NOTE) Fact Sheet for Patients: EntrepreneurPulse.com.au  Fact Sheet for Healthcare Providers: IncredibleEmployment.be  This test is not yet approved or cleared by the [place] FDA and has been authorized for detection and/or diagnosis of SARS-CoV-2 by FDA under an Emergency Use Authorization (EUA). This EUA will remain in effect (meaning this test can be used) for the duration of the COVID-19 declaration under Section 564(b)(1) of the Act, 21 U.S.C. section 360bbb-3(b)(1), unless the authorization is terminated or revoked.  Performed at [facility], [address], [place], [place] [address]      Radiological Exams on Admission: DG Chest 2 View  Result Date: 01/27/2023 CLINICAL DATA:  Shortness of breath.  Chest discomfort. EXAM: CHEST - 2 VIEW COMPARISON:  10/27/2022 FINDINGS: Heart size is normal. There is blunting of the right costophrenic angle. Opacity within the right base is noted which may reflect atelectasis or airspace disease. No interstitial edema. Left lung appears clear. IMPRESSION: Small right pleural effusion with right base opacity which may reflect atelectasis or airspace disease. Electronically Signed   By: Kerby Moors M.D.   On: 01/27/2023 16:55    Assessment/Plan Present on Admission:  Acute renal failure superimposed on stage 4 chronic kidney disease/  Hyperkalemia/ metabolic acidosis -Lokelma 10 mg p.o. twice daily ordered -Continue 66mq sodium bicarb drip until 10 AM.  Start sodium bicarb tablets BiD -Patient's potassium increased on BMP, repeat IV calcium, insulin and glucagon ordered. -Nephrology is aware, will see patient in a.m. -Strict I/os -Nephrologist to resume Lasix   Nausea and vomiting -Resolved.  IV antiemetics PRN -Rehydrating patient    HFrEF, EF 25-30% -Patient euvolemic.  Discontinue bicarb drip in AM.   -Nephrology to resume Lasix   HTN (hypertension)/CAD -Resume hydralazine, Imdur, metoprolol and Eliquis -Metoprolol   Atrial fibrillation -Metoprolol and Eliquis started   Leslea Vowles 01/28/2023, 1:06 AM

## 2023-01-28 NOTE — Progress Notes (Signed)
[facility] Progress Note  Patient: Douglas Matthews    [id number]  DOA: 01/27/2023    Date of Service: the patient was seen and examined on 01/28/2023  Brief hospital course: 88 year old male with past medical history of CKD stage IV, atrial fibrillation/flutter and diastolic heart failure presented to the emergency room on 2/27 for several days of nausea and vomiting.  No associated diarrhea or abdominal pain.  Workup revealed acute kidney injury with creatinine of 4.13 (with baseline around 2.7) and potassium of 6.  Patient given calcium gluconate, bicarb drip and Lokelma.  Nephrology consulted.  Patient admitted to the hospitalist service.   Assessment and Plan: * Acute renal failure superimposed on stage 4 chronic kidney disease ([place]) Suspect this is secondary to prerenal secondary to nausea and vomiting.  Discussed with nephrology and they will hold off on seeing unless renal function declines.  Nausea & vomiting-resolved as of 01/28/2023 Possibly secondary to gastroenteritis.  Looks to be resolving, patient tolerated p.o. breakfast without incident.  Continue to monitor.  Hyperkalemia Secondary to AKI.  Should improve as renal function improves.  On bicarb drip.  Status post Lokelma.  HFrEF (heart failure with reduced ejection fraction) (HCC) Ejection fraction of 30 to 35% as evidenced by TEE 11/29.  Mildly elevated BNP of 860, however this is in the setting of acute atrial fibrillation as well as acute on chronic kidney injury.  Patient if anything is more intravascularly volume depleted.  Holding Lasix for now.  Atrial fibrillation, chronic (HCC) Some borderline tachycardia.  Continue metoprolol and Eliquis.  HTN (hypertension) Blood pressure remained stable.  Patient states he was able to take his blood pressure medications.       Body mass index is 20.77 kg/m.        Consultants: None  Procedures: None  Antimicrobials: None  Code Status: Full  code   Subjective: Patient feeling a little bit better  Objective: Vital signs were reviewed and unremarkable. Vitals:   01/28/23 0746 01/28/23 0941  BP: (!) 123/100 108/84  Pulse: (!) 103   Resp: 16   Temp: 98 F (36.7 C)   SpO2: 95%     Intake/Output Summary (Last 24 hours) at 01/28/2023 1520 Last data filed at 01/28/2023 0930 Gross per 24 hour  Intake 1890.38 ml  Output 300 ml  Net 1590.38 ml   Filed Weights   01/27/23 1634 01/27/23 2257  Weight: 63.5 kg 62 kg   Body mass index is 20.77 kg/m.  Exam:  General: Alert and oriented x 3, fatigued HEENT: Normocephalic atraumatic, mucous membranes slightly dry Cardiovascular: Irregular rhythm, rate controlled Respiratory: Clear to auscultation bilaterally Abdomen: Soft, nontender, nondistended, normal active bowel sounds Musculoskeletal: No clubbing cyanosis or edema Skin: No skin breaks, tears or lesions Psychiatry: Appropriate, no evidence of psychoses  Neurology: No focal deficits  Data Reviewed: Creatinine down to 3.59 with potassium of 4.8.  Disposition:  Status is: Inpatient Remains inpatient appropriate because:  -Continued improvement in renal function    Anticipated discharge date: 2/29  Family Communication: Will call wife DVT Prophylaxis: apixaban (ELIQUIS) tablet 2.5 mg Start: 01/28/23 0200 apixaban (ELIQUIS) tablet 2.5 mg    Author: Annita Brod ,MD 01/28/2023 3:20 PM  To reach On-call, see care teams to locate the attending and reach out via [email or web address]. Between 7PM-7AM, please contact night-coverage If you still have difficulty reaching the attending provider, please page the [facility] (Director on Call) for [facility] on amion for assistance.

## 2023-01-28 NOTE — Progress Notes (Signed)
Mobility Specialist Progress Note:   01/28/23 1410  Mobility  Activity Ambulated with assistance in hallway  Level of Assistance Standby assist, set-up cues, supervision of patient - no hands on  Assistive Device None  Distance Ambulated (ft) 400 ft  Activity Response Tolerated well  Mobility Referral Yes  $Mobility charge 1 Mobility   Pt agreeable to mobility session. Required no physical assistance throughout, only supervision for safety. HR afib 120s throughout.  Pt back in bed with all needs met.   Nelta Numbers Mobility Specialist Please contact via SecureChat or  Rehab office at [phone]

## 2023-01-28 NOTE — Assessment & Plan Note (Addendum)
Possibly secondary to gastroenteritis.  Looks to be resolving, patient tolerated p.o. without incident.  Continue to monitor.

## 2023-01-28 NOTE — Care Management (Signed)
  Transition of Care [facility]) Screening Note   Patient Details  Name: CHESTLEY PEELER Date of Birth: 17-Jul-1935   Transition of Care [facility]) CM/SW Contact:    Bethena Roys, RN Phone Number: 01/28/2023, 12:53 PM    Transition of Care Department [facility]) has reviewed the patient and no TOC needs have been identified at this time. PTA patient was independent from home with spouse. Spouse is at the bedside during conversation. Patient was a transfer from [facility] ED for decreased oral intake and nausea and vomiting-workup revealed AKI. Patient reports that he still drives to appointments and has no issues obtaining medications. We will continue to monitor patient advancement through interdisciplinary progression rounds. If new patient transition needs arise, please place a TOC consult.

## 2023-01-28 NOTE — Hospital Course (Signed)
88 year old male with past medical history of CKD stage IV, atrial fibrillation/flutter and diastolic heart failure presented to the emergency room on 2/27 for several days of nausea and vomiting.  No associated diarrhea or abdominal pain.  Workup revealed acute kidney injury with creatinine of 4.13 (with baseline around 2.7) and potassium of 6.  Patient given calcium gluconate, bicarb drip and Lokelma.  Nephrology consulted.  Patient admitted to the hospitalist service.

## 2023-01-29 DIAGNOSIS — I482 Chronic atrial fibrillation, unspecified: Secondary | ICD-10-CM | POA: Diagnosis not present

## 2023-01-29 DIAGNOSIS — N179 Acute kidney failure, unspecified: Secondary | ICD-10-CM | POA: Diagnosis not present

## 2023-01-29 DIAGNOSIS — I5020 Unspecified systolic (congestive) heart failure: Secondary | ICD-10-CM | POA: Diagnosis not present

## 2023-01-29 DIAGNOSIS — E875 Hyperkalemia: Secondary | ICD-10-CM | POA: Diagnosis not present

## 2023-01-29 LAB — BASIC METABOLIC PANEL
Anion gap: 9 (ref 5–15)
BUN: 92 mg/dL — ABNORMAL HIGH (ref 8–23)
CO2: 20 mmol/L — ABNORMAL LOW (ref 22–32)
Calcium: 8.4 mg/dL — ABNORMAL LOW (ref 8.9–10.3)
Chloride: 110 mmol/L (ref 98–111)
Creatinine, Ser: 3.54 mg/dL — ABNORMAL HIGH (ref 0.61–1.24)
GFR, Estimated: 16 mL/min — ABNORMAL LOW (ref >60)
Glucose, Bld: 100 mg/dL — ABNORMAL HIGH (ref 70–99)
Potassium: 4.5 mmol/L (ref 3.5–5.1)
Sodium: 139 mmol/L (ref 135–145)

## 2023-01-29 MED ORDER — SODIUM CHLORIDE 0.9 % IV SOLN: INTRAVENOUS | Status: DC

## 2023-01-29 MED ORDER — METOPROLOL TARTRATE 5 MG/5ML IV SOLN
2.5000 mg | Freq: Four times a day (QID) | INTRAVENOUS | Status: DC | PRN
  Administered 2023-01-29: 2.5 mg via INTRAVENOUS
  Filled 2023-01-29: qty 5

## 2023-01-29 NOTE — Progress Notes (Signed)
Mobility Specialist Progress Note:   01/29/23 1110  Mobility  Activity Ambulated with assistance in hallway  Level of Assistance Standby assist, set-up cues, supervision of patient - no hands on  Assistive Device None  Distance Ambulated (ft) 400 ft  Activity Response Tolerated well  Mobility Referral Yes  $Mobility charge 1 Mobility   Pt agreeable to mobility session. Required no physical assistance throughout, however HR elevated to 160s. Pt asx, recovered with standing rest break. Pt left sitting up in chair with all needs met.   Nelta Numbers Mobility Specialist Please contact via SecureChat or  Rehab office at [phone]

## 2023-01-29 NOTE — Progress Notes (Signed)
[facility] Progress Note  Patient: Douglas Matthews    [id number]  DOA: 01/27/2023    Date of Service: the patient was seen and examined on 01/29/2023  Brief hospital course: 88 year old male with past medical history of CKD stage IV, atrial fibrillation/flutter and diastolic heart failure presented to the emergency room on 2/27 for several days of nausea and vomiting.  No associated diarrhea or abdominal pain.  Workup revealed acute kidney injury with creatinine of 4.13 (with baseline around 2.7) and potassium of 6.  Patient given calcium gluconate, bicarb drip and Lokelma.  Nephrology consulted.  Patient admitted to the hospitalist service.   Assessment and Plan: * Acute renal failure superimposed on stage 4 chronic kidney disease ([place]) Suspect this is secondary to prerenal secondary to nausea and vomiting.  Discussed with nephrology and they will hold off on seeing unless renal function declines.  Renal function low improvement, continue gentle IV fluids  Nausea & vomiting-resolved as of 01/28/2023 Possibly secondary to gastroenteritis.  Looks to be resolving, patient tolerated p.o. without incident.  Continue to monitor.  Hyperkalemia-resolved as of 01/29/2023 Secondary to AKI.  Should improve as renal function improves.  On bicarb drip.  Status post Lokelma.  HFrEF (heart failure with reduced ejection fraction) (HCC) Ejection fraction of 30 to 35% as evidenced by TEE 11/29.  Mildly elevated BNP of 860, however this is in the setting of acute atrial fibrillation as well as acute on chronic kidney injury.  Patient if anything is more intravascularly volume depleted.  Holding Lasix for now.  Rechecking BNP in the morning  Atrial fibrillation, chronic (HCC) Some borderline tachycardia.  Continue metoprolol and Eliquis.  With any activity, patient starts to have increased heart rate.  Have added as needed Lopressor  HTN (hypertension) Blood pressure reported.  Possibly from IV  fluids.  Patient states he was able to take his blood pressure medications.       Body mass index is 20.77 kg/m.        Consultants: None  Procedures: None  Antimicrobials: None  Code Status: Full code   Subjective: Patient feeling a little ok,  No complaints Objective: Vital signs were reviewed and unremarkable. Vitals:   01/29/23 0700 01/29/23 1000  BP: (!) 128/97 117/79  Pulse: 98 96  Resp: 18 20  Temp: 98.1 F (36.7 C)   SpO2: 97% 97%    Intake/Output Summary (Last 24 hours) at 01/29/2023 1417 Last data filed at 01/29/2023 1205 Gross per 24 hour  Intake 470.7 ml  Output 540 ml  Net -69.3 ml   Filed Weights   01/27/23 1634 01/27/23 2257  Weight: 63.5 kg 62 kg   Body mass index is 20.77 kg/m.  Exam:  General: Alert and oriented x 3, fatigued HEENT: Normocephalic atraumatic, mucous membranes slightly dry Cardiovascular: Irregular rhythm, rate controlled, borderline tachycardia Respiratory: Clear to auscultation bilaterally Abdomen: Soft, nontender, nondistended, normal active bowel sounds Musculoskeletal: No clubbing cyanosis or edema Skin: No skin breaks, tears or lesions Psychiatry: Appropriate, no evidence of psychoses  Neurology: No focal deficits  Data Reviewed: Creatinine with slight improvement at 3.54  Disposition:  Status is: Inpatient Remains inpatient appropriate because:  -Continued improvement in renal function -Following up on volume overload    Anticipated discharge date:  3/1  Family Communication: Wife at bedside DVT Prophylaxis: apixaban (ELIQUIS) tablet 2.5 mg Start: 01/28/23 0200 apixaban (ELIQUIS) tablet 2.5 mg    Author: Annita Brod ,MD 01/29/2023 2:17 PM  To reach On-call, see  care teams to locate the attending and reach out via [email or web address]. Between 7PM-7AM, please contact night-coverage If you still have difficulty reaching the attending provider, please page the [facility] (Director on Call) for [facility] on amion for assistance.

## 2023-01-30 ENCOUNTER — Encounter (HOSPITAL_COMMUNITY): Payer: Medicare Other

## 2023-01-30 DIAGNOSIS — I5020 Unspecified systolic (congestive) heart failure: Secondary | ICD-10-CM | POA: Diagnosis not present

## 2023-01-30 DIAGNOSIS — R112 Nausea with vomiting, unspecified: Secondary | ICD-10-CM | POA: Diagnosis not present

## 2023-01-30 DIAGNOSIS — N179 Acute kidney failure, unspecified: Secondary | ICD-10-CM | POA: Diagnosis not present

## 2023-01-30 DIAGNOSIS — E875 Hyperkalemia: Secondary | ICD-10-CM | POA: Diagnosis not present

## 2023-01-30 LAB — BASIC METABOLIC PANEL
Anion gap: 10 (ref 5–15)
BUN: 80 mg/dL — ABNORMAL HIGH (ref 8–23)
CO2: 19 mmol/L — ABNORMAL LOW (ref 22–32)
Calcium: 8.4 mg/dL — ABNORMAL LOW (ref 8.9–10.3)
Chloride: 111 mmol/L (ref 98–111)
Creatinine, Ser: 3.04 mg/dL — ABNORMAL HIGH (ref 0.61–1.24)
GFR, Estimated: 19 mL/min — ABNORMAL LOW (ref >60)
Glucose, Bld: 103 mg/dL — ABNORMAL HIGH (ref 70–99)
Potassium: 4.4 mmol/L (ref 3.5–5.1)
Sodium: 140 mmol/L (ref 135–145)

## 2023-01-30 LAB — BRAIN NATRIURETIC PEPTIDE: B Natriuretic Peptide: 751.6 pg/mL — ABNORMAL HIGH (ref 0.0–100.0)

## 2023-01-30 MED ORDER — METOPROLOL SUCCINATE ER 100 MG PO TB24: 100.0000 mg | ORAL_TABLET | Freq: Every day | ORAL | 1 refills | Status: DC

## 2023-01-30 MED ORDER — METOPROLOL TARTRATE 50 MG PO TABS
50.0000 mg | ORAL_TABLET | ORAL | Status: AC
  Administered 2023-01-30: 50 mg via ORAL
  Filled 2023-01-30: qty 1

## 2023-01-30 NOTE — Evaluation (Signed)
Physical Therapy Evaluation and D/C Patient Details Name: Douglas Matthews MRN: [id number] DOB: Apr 30, 1935 Today's Date: 01/30/2023  History of Present Illness  The patient is a 88 year old male admitted 2/27 after going to [facility] ED with complaints of decreased oral intake, nausea and vomiting for the past 2-3 days.  Associated with exertional dyspnea and chest discomfort. Found to have AKI and CHF. 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  Pt currently without functional limitations and can ambulate without device and without assist.  Pt doesn't need skilled PT at this time. Mobility specialist can continue to ambulate with this pt daily.  Did alert the MD that pt with HR 94bpm up to 161 bpm with activity with pt asymptomatic.  Will sign off.    Recommendations for follow up therapy are one component of a multi-disciplinary discharge planning process, led by the attending physician.  Recommendations may be updated based on patient status, additional functional criteria and insurance authorization.  Follow Up Recommendations No PT follow up      Assistance Recommended at Discharge None  Patient can return home with the following       Equipment Recommendations None recommended by PT  Recommendations for Other Services       Functional Status Assessment Patient has not had a recent decline in their functional status     Precautions / Restrictions Precautions Precautions: Fall Restrictions Weight Bearing Restrictions: No      Mobility  Bed Mobility Overal bed mobility: Independent                  Transfers Overall transfer level: Independent                      Ambulation/Gait Ambulation/Gait assistance: Supervision Gait Distance (Feet): 250 Feet Assistive device: None Gait Pattern/deviations: Step-through pattern, Decreased stride  length   Gait velocity interpretation: 1.31 - 2.62 ft/sec, indicative of limited community ambulator   General Gait Details: Pt with good balance overall with gait.  Pt with intiial HR 94bpm with incr as high as 161 bpm with activity.  Pt asymptomatic. MD made aware. HR would decr with standing rest break however incr with ambulation. Once back in room, HR took incr time to resolve to 96 bpm after 5 min pt lying in bed.  Stairs            Wheelchair Mobility    Modified Rankin (Stroke Patients Only)       Balance                                             Pertinent Vitals/Pain Pain Assessment Pain Assessment: No/denies pain    Home Living Family/patient expects to be discharged to:: Private residence Living Arrangements: Spouse/significant other Available Help at Discharge: Available 24 hours/day Type of Home: House Home Access: Stairs to enter   [employer] of Steps: 1   Home Layout: One level Home Equipment: None      Prior Function Prior Level of Function : Independent/Modified Independent;Driving;Other (comment) (goes to therapy)                     Hand Dominance   Dominant Hand: Right    Extremity/Trunk Assessment   Upper Extremity Assessment Upper Extremity  Assessment: Defer to OT evaluation    Lower Extremity Assessment Lower Extremity Assessment: Generalized weakness    Cervical / Trunk Assessment Cervical / Trunk Assessment: Normal  Communication      Cognition Arousal/Alertness: Awake/alert Behavior During Therapy: WFL for tasks assessed/performed Overall Cognitive Status: Within Functional Limits for tasks assessed                                          General Comments      Exercises     Assessment/Plan    PT Assessment Patient does not need any further PT services  PT Problem List Decreased activity tolerance       PT Treatment Interventions DME instruction;Gait  training;Functional mobility training;Therapeutic activities;Therapeutic exercise;Balance training;Patient/family education    PT Goals (Current goals can be found in the Care Plan section)  Acute Rehab PT Goals Patient Stated Goal: to go home today PT Goal Formulation: All assessment and education complete, DC therapy    Frequency Min 3X/week     Co-evaluation               AM-PAC PT "6 Clicks" Mobility  Outcome Measure Help needed turning from your back to your side while in a flat bed without using bedrails?: None Help needed moving from lying on your back to sitting on the side of a flat bed without using bedrails?: None Help needed moving to and from a bed to a chair (including a wheelchair)?: A Little Help needed standing up from a chair using your arms (e.g., wheelchair or bedside chair)?: A Little Help needed to walk in hospital room?: A Little Help needed climbing 3-5 steps with a railing? : A Little 6 Click Score: 20    End of Session Equipment Utilized During Treatment: Gait belt Activity Tolerance: Patient limited by fatigue Patient left: in bed;with call bell/phone within reach;with family/visitor present Nurse Communication: Mobility status PT Visit Diagnosis: Muscle weakness (generalized) (M62.81)    Time: [id number] PT Time Calculation (min) (ACUTE ONLY): 20 min   Charges:   PT Evaluation $PT Eval Moderate Complexity: 1 Mod          [name] M,PT Acute Rehab Services [phone]   [name] 01/30/2023, 3:26 PM

## 2023-01-30 NOTE — Progress Notes (Signed)
Mobility Specialist Progress Note:   01/30/23 1045  Mobility  Activity Transferred from bed to chair  Level of Assistance Standby assist, set-up cues, supervision of patient - no hands on  Assistive Device None  Distance Ambulated (ft) 3 ft  Activity Response Tolerated well  Mobility Referral Yes  $Mobility charge 1 Mobility   Pre Mobility: HR 133 afib During Mobility: HR 148 afib  Post Mobility: HR 110 afib  Pt agreeable to mobility session, however continues to be limited by incr HR. Deferred ambulation d/t HR 133 while supine. Transferred to chair with no physical assistance. Pt left with all needs met. RN notified of HR.  Nelta Numbers Mobility Specialist Please contact via SecureChat or  Rehab office at [phone]

## 2023-01-30 NOTE — Discharge Summary (Signed)
Physician Discharge Summary   Patient: Douglas Matthews MRN: [id number] DOB: May 08, 1935  Admit date:     01/27/2023  Discharge date: 01/30/23  Discharge Physician: Annita Brod   PCP: Martinique, [name], MD   Recommendations at discharge:   Medication change: Toprol XL increased from 75 mg to 100 mg p.o. daily Patient will follow-up with PCP in 1 week.  At that time, heart rate can be assessed to determine if needs to change back to 75 mg p.o. daily  Discharge Diagnoses: Principal Problem:   Acute renal failure superimposed on stage 4 chronic kidney disease (HCC) Active Problems:   HFrEF (heart failure with reduced ejection fraction) (HCC)   Atrial fibrillation, chronic (HCC)   HTN (hypertension)   HLD (hyperlipidemia)   BPH associated with nocturia   Metabolic acidosis  Resolved Problems:   Nausea & vomiting   Hyperkalemia  Hospital Course: 88 year old male with past medical history of CKD stage IV, atrial fibrillation/flutter and diastolic heart failure presented to the emergency room on 2/27 for several days of nausea and vomiting.  No associated diarrhea or abdominal pain.  Workup revealed acute kidney injury with creatinine of 4.13 (with baseline around 2.7) and potassium of 6.  Patient given calcium gluconate, bicarb drip and Lokelma.  Nephrology consulted.  Patient admitted to the hospitalist service.  Assessment and Plan: * Acute renal failure superimposed on stage 4 chronic kidney disease ([place]) Suspect this is secondary to prerenal secondary to nausea and vomiting.  Discussed with nephrology and they will hold off on seeing unless renal function declines.  Renal function slowly improved and by morning of 3/1, creatinine down to 3.04.  Patient continued to receive IV fluids throughout the day.  By afternoon, blood pressure stable and patient feeling better and okay to be discharged.  Nausea & vomiting-resolved as of 01/28/2023 Possibly secondary to gastroenteritis.   Looks to be resolving, patient tolerated p.o. without incident.  Continue to monitor.  Hyperkalemia-resolved as of 01/29/2023 Secondary to AKI.  Should improve as renal function improves.  On bicarb drip.  Status post Lokelma.  HFrEF (heart failure with reduced ejection fraction) (HCC) Ejection fraction of 30 to 35% as evidenced by TEE 11/29.  Mildly elevated BNP of 860, however this is in the setting of acute atrial fibrillation as well as acute on chronic kidney injury.  Patient if anything is more intravascularly volume depleted.  Lasix resumed on discharge.  Atrial fibrillation, chronic (HCC) Some borderline tachycardia.  Continue metoprolol and Eliquis.  Persistent tachycardia likely combination of atrial fibrillation and still with some volume depletion.  Heart rate more so with activity.  Increase Toprol from 75 p.o. daily to 100 p.o. daily.  Discharged with new prescription.  Patient will follow-up with PCP in the next week and at that time, heart rate can be assessed to see if medication needs to be decreased back to home dose.  HTN (hypertension) Blood pressure reported.  Possibly from IV fluids.  Patient states he was able to take his blood pressure medications.         Consultants: None Procedures performed: None Disposition: Home Diet recommendation:  Discharge Diet Orders (From admission, onward)     Start     Ordered   01/30/23 0000  Diet - low sodium heart healthy        01/30/23 1338           Cardiac diet DISCHARGE MEDICATION: Allergies as of 01/30/2023   No Known Allergies  Medication List     TAKE these medications    apixaban 2.5 MG Tabs tablet Commonly known as: ELIQUIS Take 1 tablet (2.5 mg total) by mouth 2 (two) times daily. Notes to patient: Blood thinner Prevents stroke    COD LIVER OIL PO Take 1 tablet by mouth daily. Notes to patient: Supplement    furosemide 40 MG tablet Commonly known as: LASIX Take 1 tablet (40 mg total) by  mouth daily as needed for edema or fluid (as needed in case of weight gain 2 to 3 lbs in 24 hrs or 5 lbs in 7 days, leg swelling or shortness of breath.). Notes to patient: Fluid medication    GARLIC PO Take 1 tablet by mouth daily. Notes to patient: Supplement    hydrALAZINE 10 MG tablet Commonly known as: APRESOLINE Take 1 tablet (10 mg total) by mouth 3 (three) times daily. Notes to patient: Lowers blood pressure   isosorbide mononitrate 30 MG 24 hr tablet Commonly known as: IMDUR Take 1 tablet (30 mg total) by mouth daily. Notes to patient: Increases blood flow to the heart Lowers blood pressure   metoprolol succinate 100 MG 24 hr tablet Commonly known as: Toprol XL Take 1 tablet (100 mg total) by mouth daily. Start taking on: January 31, 2023 What changed:  medication strength how much to take Notes to patient: Lowers heart rate  Lowers blood pressure DOSE CHANGE: was taking 75 mg daily  Changed to '100mg'$  daily   multivitamin capsule Take 1 capsule by mouth daily. Notes to patient: Supplement    Saw Palmetto 450 MG Caps Take 900 mg by mouth daily. Notes to patient: Supplement         Discharge Exam: Filed Weights   01/27/23 1634 01/27/23 2257  Weight: 63.5 kg 62 kg   General: Alert and oriented x 3, no acute distress Cardiovascular: Irregular rhythm, mild tachycardia  Condition at discharge: improving  The results of significant diagnostics from this hospitalization (including imaging, microbiology, ancillary and laboratory) are listed below for reference.   Imaging Studies: DG Chest 2 View  Result Date: 01/27/2023 CLINICAL DATA:  Shortness of breath.  Chest discomfort. EXAM: CHEST - 2 VIEW COMPARISON:  10/27/2022 FINDINGS: Heart size is normal. There is blunting of the right costophrenic angle. Opacity within the right base is noted which may reflect atelectasis or airspace disease. No interstitial edema. Left lung appears clear. IMPRESSION: Small right  pleural effusion with right base opacity which may reflect atelectasis or airspace disease. Electronically Signed   By: Kerby Moors M.D.   On: 01/27/2023 16:55    Microbiology: Results for orders placed or performed during the hospital encounter of 01/27/23  Resp panel by RT-PCR (RSV, Flu A&B, Covid) Anterior Nasal Swab     Status: None   Collection Time: 01/27/23  7:24 PM   Specimen: Anterior Nasal Swab  Result Value Ref Range Status   SARS Coronavirus 2 by RT PCR NEGATIVE NEGATIVE Final    Comment: (NOTE) SARS-CoV-2 target nucleic acids are NOT DETECTED.  The SARS-CoV-2 RNA is generally detectable in upper respiratory specimens during the acute phase of infection. The lowest concentration of SARS-CoV-2 viral copies this assay can detect is 138 copies/mL. A negative result does not preclude SARS-Cov-2 infection and should not be used as the sole basis for treatment or other patient management decisions. A negative result may occur with  improper specimen collection/handling, submission of specimen other than nasopharyngeal swab, presence of viral mutation(s) within the areas  targeted by this assay, and inadequate number of viral copies(<138 copies/mL). A negative result must be combined with clinical observations, patient history, and epidemiological information. The expected result is Negative.  Fact Sheet for Patients:  EntrepreneurPulse.com.au  Fact Sheet for Healthcare Providers:  IncredibleEmployment.be  This test is no t yet approved or cleared by the [place] FDA and  has been authorized for detection and/or diagnosis of SARS-CoV-2 by FDA under an Emergency Use Authorization (EUA). This EUA will remain  in effect (meaning this test can be used) for the duration of the COVID-19 declaration under Section 564(b)(1) of the Act, 21 U.S.C.section 360bbb-3(b)(1), unless the authorization is terminated  or revoked sooner.        Influenza A by PCR NEGATIVE NEGATIVE Final   Influenza B by PCR NEGATIVE NEGATIVE Final    Comment: (NOTE) The Xpert Xpress SARS-CoV-2/FLU/RSV plus assay is intended as an aid in the diagnosis of influenza from Nasopharyngeal swab specimens and should not be used as a sole basis for treatment. Nasal washings and aspirates are unacceptable for Xpert Xpress SARS-CoV-2/FLU/RSV testing.  Fact Sheet for Patients: EntrepreneurPulse.com.au  Fact Sheet for Healthcare Providers: IncredibleEmployment.be  This test is not yet approved or cleared by the [place] FDA and has been authorized for detection and/or diagnosis of SARS-CoV-2 by FDA under an Emergency Use Authorization (EUA). This EUA will remain in effect (meaning this test can be used) for the duration of the COVID-19 declaration under Section 564(b)(1) of the Act, 21 U.S.C. section 360bbb-3(b)(1), unless the authorization is terminated or revoked.     Resp Syncytial Virus by PCR NEGATIVE NEGATIVE Final    Comment: (NOTE) Fact Sheet for Patients: EntrepreneurPulse.com.au  Fact Sheet for Healthcare Providers: IncredibleEmployment.be  This test is not yet approved or cleared by the [place] FDA and has been authorized for detection and/or diagnosis of SARS-CoV-2 by FDA under an Emergency Use Authorization (EUA). This EUA will remain in effect (meaning this test can be used) for the duration of the COVID-19 declaration under Section 564(b)(1) of the Act, 21 U.S.C. section 360bbb-3(b)(1), unless the authorization is terminated or revoked.  Performed at [facility], [address] [place], [place] [address]     Labs: CBC: Recent Labs  Lab 01/27/23 1633 01/27/23 1849 01/28/23 0204  WBC 4.7  --  4.1  NEUTROABS  --   --  2.6  HGB 12.7* 11.2* 11.8*  HCT 39.1 33.0* 36.0*  MCV 89.5  --  88.7  PLT 172  --  144*   Basic  Metabolic Panel: Recent Labs  Lab 01/27/23 2038 01/28/23 0204 01/28/23 1121 01/29/23 0154 01/30/23 0452  NA 141 140 139 139 140  K 4.9 5.8* 4.8 4.5 4.4  CL 112* 112* 111 110 111  CO2 18* 16* 17* 20* 19*  GLUCOSE 132* 93 115* 100* 103*  BUN 97* 101* 96* 92* 80*  CREATININE 3.93* 3.84* 3.59* 3.54* 3.04*  CALCIUM 9.5 9.0 8.7* 8.4* 8.4*   Liver Function Tests: Recent Labs  Lab 01/27/23 1915  AST 34  ALT 42  ALKPHOS 135*  BILITOT 0.7  PROT 8.1  ALBUMIN 4.5   CBG: Recent Labs  Lab 01/27/23 2032  GLUCAP 129*    Discharge time spent: less than 30 minutes.  Signed: Annita Brod, MD [facility] 01/30/2023

## 2023-01-30 NOTE — Care Management Important Message (Signed)
Important Message  Patient Details  Name: NESBITT GANDIA MRN: [id number] Date of Birth: 07-Sep-1935   Medicare Important Message Given:  Yes     Shelda Altes 01/30/2023, 9:50 AM

## 2023-01-30 NOTE — Progress Notes (Signed)
Mobility Specialist Progress Note:   01/30/23 1200  Mobility  Activity Ambulated with assistance to bathroom  Level of Assistance Standby assist, set-up cues, supervision of patient - no hands on  Assistive Device None  Distance Ambulated (ft) 30 ft  Activity Response Tolerated well  Mobility Referral Yes  $Mobility charge 1 Mobility   Pt requesting to go to the BR. Required no physical assistance, however HR elevated to 150s. Pt asx, deferred further mobility. Pt left in bed with all needs met.   Nelta Numbers Mobility Specialist Please contact via SecureChat or  Rehab office at [phone]

## 2023-02-02 ENCOUNTER — Telehealth: Payer: Self-pay

## 2023-02-02 ENCOUNTER — Ambulatory Visit (HOSPITAL_BASED_OUTPATIENT_CLINIC_OR_DEPARTMENT_OTHER): Payer: Medicare Other

## 2023-02-02 ENCOUNTER — Encounter (HOSPITAL_COMMUNITY): Payer: Medicare Other

## 2023-02-02 ENCOUNTER — Telehealth (HOSPITAL_COMMUNITY): Payer: Self-pay | Admitting: *Deleted

## 2023-02-02 DIAGNOSIS — I447 Left bundle-branch block, unspecified: Secondary | ICD-10-CM | POA: Diagnosis present

## 2023-02-02 DIAGNOSIS — J90 Pleural effusion, not elsewhere classified: Secondary | ICD-10-CM | POA: Diagnosis not present

## 2023-02-02 DIAGNOSIS — I081 Rheumatic disorders of both mitral and tricuspid valves: Secondary | ICD-10-CM | POA: Diagnosis present

## 2023-02-02 DIAGNOSIS — I132 Hypertensive heart and chronic kidney disease with heart failure and with stage 5 chronic kidney disease, or end stage renal disease: Secondary | ICD-10-CM | POA: Diagnosis present

## 2023-02-02 DIAGNOSIS — R0989 Other specified symptoms and signs involving the circulatory and respiratory systems: Secondary | ICD-10-CM | POA: Diagnosis not present

## 2023-02-02 DIAGNOSIS — R570 Cardiogenic shock: Secondary | ICD-10-CM | POA: Diagnosis not present

## 2023-02-02 DIAGNOSIS — I130 Hypertensive heart and chronic kidney disease with heart failure and stage 1 through stage 4 chronic kidney disease, or unspecified chronic kidney disease: Secondary | ICD-10-CM | POA: Diagnosis not present

## 2023-02-02 DIAGNOSIS — Z66 Do not resuscitate: Secondary | ICD-10-CM | POA: Diagnosis not present

## 2023-02-02 DIAGNOSIS — N19 Unspecified kidney failure: Secondary | ICD-10-CM | POA: Diagnosis present

## 2023-02-02 DIAGNOSIS — N179 Acute kidney failure, unspecified: Secondary | ICD-10-CM | POA: Diagnosis not present

## 2023-02-02 DIAGNOSIS — Z7901 Long term (current) use of anticoagulants: Secondary | ICD-10-CM | POA: Diagnosis not present

## 2023-02-02 DIAGNOSIS — I429 Cardiomyopathy, unspecified: Secondary | ICD-10-CM | POA: Diagnosis present

## 2023-02-02 DIAGNOSIS — E872 Acidosis, unspecified: Secondary | ICD-10-CM | POA: Diagnosis present

## 2023-02-02 DIAGNOSIS — D631 Anemia in chronic kidney disease: Secondary | ICD-10-CM | POA: Diagnosis not present

## 2023-02-02 DIAGNOSIS — I4892 Unspecified atrial flutter: Secondary | ICD-10-CM | POA: Diagnosis present

## 2023-02-02 DIAGNOSIS — I480 Paroxysmal atrial fibrillation: Secondary | ICD-10-CM | POA: Diagnosis not present

## 2023-02-02 DIAGNOSIS — N184 Chronic kidney disease, stage 4 (severe): Secondary | ICD-10-CM | POA: Diagnosis not present

## 2023-02-02 DIAGNOSIS — N189 Chronic kidney disease, unspecified: Secondary | ICD-10-CM | POA: Diagnosis not present

## 2023-02-02 DIAGNOSIS — R110 Nausea: Secondary | ICD-10-CM | POA: Diagnosis not present

## 2023-02-02 DIAGNOSIS — I5042 Chronic combined systolic (congestive) and diastolic (congestive) heart failure: Secondary | ICD-10-CM | POA: Insufficient documentation

## 2023-02-02 DIAGNOSIS — Z8701 Personal history of pneumonia (recurrent): Secondary | ICD-10-CM | POA: Diagnosis not present

## 2023-02-02 DIAGNOSIS — R392 Extrarenal uremia: Secondary | ICD-10-CM | POA: Diagnosis not present

## 2023-02-02 DIAGNOSIS — E785 Hyperlipidemia, unspecified: Secondary | ICD-10-CM | POA: Diagnosis present

## 2023-02-02 DIAGNOSIS — N400 Enlarged prostate without lower urinary tract symptoms: Secondary | ICD-10-CM | POA: Diagnosis present

## 2023-02-02 DIAGNOSIS — D649 Anemia, unspecified: Secondary | ICD-10-CM | POA: Diagnosis not present

## 2023-02-02 DIAGNOSIS — I10 Essential (primary) hypertension: Secondary | ICD-10-CM | POA: Diagnosis not present

## 2023-02-02 DIAGNOSIS — N185 Chronic kidney disease, stage 5: Secondary | ICD-10-CM | POA: Diagnosis present

## 2023-02-02 DIAGNOSIS — I4891 Unspecified atrial fibrillation: Secondary | ICD-10-CM | POA: Diagnosis not present

## 2023-02-02 DIAGNOSIS — I4819 Other persistent atrial fibrillation: Secondary | ICD-10-CM | POA: Diagnosis not present

## 2023-02-02 DIAGNOSIS — I5043 Acute on chronic combined systolic (congestive) and diastolic (congestive) heart failure: Secondary | ICD-10-CM | POA: Diagnosis present

## 2023-02-02 DIAGNOSIS — Z79899 Other long term (current) drug therapy: Secondary | ICD-10-CM | POA: Diagnosis not present

## 2023-02-02 DIAGNOSIS — Z87891 Personal history of nicotine dependence: Secondary | ICD-10-CM | POA: Diagnosis not present

## 2023-02-02 DIAGNOSIS — I5023 Acute on chronic systolic (congestive) heart failure: Secondary | ICD-10-CM | POA: Diagnosis not present

## 2023-02-02 DIAGNOSIS — I5082 Biventricular heart failure: Secondary | ICD-10-CM | POA: Diagnosis present

## 2023-02-02 DIAGNOSIS — Z515 Encounter for palliative care: Secondary | ICD-10-CM | POA: Diagnosis not present

## 2023-02-02 DIAGNOSIS — Z7189 Other specified counseling: Secondary | ICD-10-CM | POA: Diagnosis not present

## 2023-02-02 DIAGNOSIS — I5020 Unspecified systolic (congestive) heart failure: Secondary | ICD-10-CM | POA: Diagnosis not present

## 2023-02-02 DIAGNOSIS — N1832 Chronic kidney disease, stage 3b: Secondary | ICD-10-CM | POA: Diagnosis not present

## 2023-02-02 LAB — ECHOCARDIOGRAM COMPLETE
Area-P 1/2: 6.67 cm2
MV M vel: 4.57 m/s
MV Peak grad: 83.4 mmHg
Radius: 0.7 cm
S' Lateral: 3.6 cm

## 2023-02-02 NOTE — Telephone Encounter (Signed)
Spoke with Mrs Douglas Matthews, Douglas Matthews was discharged from the hospital this past Friday. I told Mrs Douglas Matthews we will need clearance from Dr Martinique for Douglas Matthews to return to exercise at cardiac rehab.Mrs Douglas Matthews said she will call to get a follow up appointment with Dr Martinique today.Harrell Gave RN BSN

## 2023-02-02 NOTE — Progress Notes (Signed)
HPI: Mr.Yuniel E Betsill is a 88 y.o. male with past medical history significant for CKD stage IV, atrial fibrillation/flutter, diastolic dysfunction here today with his wife to follow on recent hospitalization. He was admitted on 01/27/2023 and discharged home on 01/30/2023. TOC call on 02/02/23. He presented to the ED on the date of admission complaining of nausea and vomiting, no associated diarrhea or abdominal pain. Initial workup revealed acute kidney injury with a creatinine of 4.1 (baseline around 2.7) and potassium of 6.0. He was treated with calcium gluconate, bicarbonate drip, and Lokelma. Nephrology consultation was done during hospitalization. Renal function gradually improved, closer to his baseline creatinine.  He does not have an appointment for follow-up with his nephrologist, last seen sometime 2023.  Lab Results  Component Value Date   CREATININE 3.04 (H) 01/30/2023   BUN 80 (H) 01/30/2023   NA 140 01/30/2023   K 4.4 01/30/2023   CL 111 01/30/2023   CO2 19 (L) 01/30/2023   Lab Results  Component Value Date   WBC 4.1 01/28/2023   HGB 11.8 (L) 01/28/2023   HCT 36.0 (L) 01/28/2023   MCV 88.7 01/28/2023   PLT 144 (L) 01/28/2023   Nausea and vomiting resolved, thought to be caused by gastroenteritis. He still has mild nausea and appetite is not back to baseline. Negative for abdominal pain or changes in bowel habits.  Lab Results  Component Value Date   ALT 42 01/27/2023   AST 34 01/27/2023   ALKPHOS 135 (H) 01/27/2023   BILITOT 0.7 01/27/2023   Atrial fibrillation: On Metoprolol succinate 100 mg daily, increased from 75 mg because mild tachycardia. Eliquis 2.5 mg twice daily. Prior to this hospitalization, he had one in 10/2022, new onset of atrial fibrillation with RVR.  He has been following with cardiology since then. Hypertension: He is on hydralazine 10 mg 3 times daily and Imdur 30 mg daily. Orthopnea and PND as well as dyspnea of exertion noted by his  wife, stable. He denies CP, palpitation, diaphoresis.  LE edema mildly worse since hospital discharge. He is not taking furosemide 40 mg at this time. He underwent echo yesterday, pending review from his cardiologist.  Before hospitalization he was doing cardiac rehab, he is not sure if he wants to go back this week or next week. H&H services has not been arranged. He is ambulating without assistance but feels "a little" unbalance.Independent for rest of ADLs. His wife is concerned about all the medication he is taking and wonders about the benefit.  Review of Systems  Constitutional:  Positive for activity change, appetite change and fatigue.  HENT:  Negative for mouth sores, nosebleeds and sore throat.   Respiratory:  Negative for cough and wheezing.   Genitourinary:  Negative for decreased urine volume, dysuria and hematuria.  Neurological:  Negative for syncope, weakness and headaches.  Psychiatric/Behavioral:  Negative for confusion.   See other pertinent positives and negatives in HPI.  Current Outpatient Medications on File Prior to Visit  Medication Sig Dispense Refill   apixaban (ELIQUIS) 2.5 MG TABS tablet Take 1 tablet (2.5 mg total) by mouth 2 (two) times daily. 180 tablet 1   COD LIVER OIL PO Take 1 tablet by mouth daily.     furosemide (LASIX) 40 MG tablet Take 1 tablet (40 mg total) by mouth daily as needed for edema or fluid (as needed in case of weight gain 2 to 3 lbs in 24 hrs or 5 lbs in 7 days, leg swelling or  shortness of breath.). 30 tablet 3   GARLIC PO Take 1 tablet by mouth daily.     hydrALAZINE (APRESOLINE) 10 MG tablet Take 1 tablet (10 mg total) by mouth 3 (three) times daily. 270 tablet 1   isosorbide mononitrate (IMDUR) 30 MG 24 hr tablet Take 1 tablet (30 mg total) by mouth daily. 90 tablet 1   metoprolol succinate (TOPROL XL) 100 MG 24 hr tablet Take 1 tablet (100 mg total) by mouth daily. 30 tablet 1   Multiple Vitamin (MULTIVITAMIN) capsule Take 1  capsule by mouth daily.     Saw Palmetto 450 MG CAPS Take 900 mg by mouth daily.     No current facility-administered medications on file prior to visit.   Past Medical History:  Diagnosis Date   Anemia    BPH (benign prostatic hypertrophy)    Diverticulosis    Hyperlipidemia    Hypertension    No Known Allergies  Social History   Socioeconomic History   Marital status: Married    Spouse name: Not on file   Number of children: Not on file   Years of education: Not on file   Highest education level: Not on file  Occupational History   Occupation: Retired  Tobacco Use   Smoking status: Never   Smokeless tobacco: Never  Vaping Use   Vaping Use: Never used  Substance and Sexual Activity   Alcohol use: No   Drug use: No   Sexual activity: Not on file  Other Topics Concern   Not on file  Social History Narrative   Married x 50 years    Social Determinants of Health   Financial Resource Strain: Low Risk  (07/08/2022)   Overall Financial Resource Strain (CARDIA)    Difficulty of Paying Living Expenses: Not hard at all  Food Insecurity: No Food Insecurity (02/02/2023)   Hunger Vital Sign    Worried About Running Out of Food in the Last Year: Never true    Ran Out of Food in the Last Year: Never true  Transportation Needs: No Transportation Needs (02/02/2023)   PRAPARE - Administrator, Civil Service (Medical): No    Lack of Transportation (Non-Medical): No  Physical Activity: Insufficiently Active (07/08/2022)   Exercise Vital Sign    Days of Exercise per Week: 2 days    Minutes of Exercise per Session: 30 min  Stress: No Stress Concern Present (07/08/2022)   [employer] of Occupational Health - Occupational Stress Questionnaire    Feeling of Stress : Not at all  Social Connections: Moderately Integrated (07/02/2021)   Social Connection and Isolation Panel [NHANES]    Frequency of Communication with Friends and Family: Three times a week    Frequency of  Social Gatherings with Friends and Family: Three times a week    Attends Religious Services: More than 4 times per year    Active Member of Clubs or Organizations: No    Attends Banker Meetings: Never    Marital Status: Married   Today's Vitals   02/03/23 1407  BP: 110/70  Pulse: 94  Resp: 16  SpO2: 98%  Weight: 150 lb 4 oz (68.2 kg)  Height: 5\' 8"  (1.727 m)   Body mass index is 22.85 kg/m.  Physical Exam Vitals and nursing note reviewed.  Constitutional:      General: He is not in acute distress.    Appearance: He is well-developed.  HENT:     Head: Normocephalic and atraumatic.  Mouth/Throat:     Mouth: Mucous membranes are moist.     Dentition: Has dentures.     Pharynx: Oropharynx is clear.  Eyes:     Conjunctiva/sclera: Conjunctivae normal.  Neck:     Vascular: JVD present.  Cardiovascular:     Rate and Rhythm: Normal rate. Rhythm irregular.     Pulses:          Dorsalis pedis pulses are 2+ on the right side and 2+ on the left side.     Heart sounds: No murmur heard. Pulmonary:     Effort: Pulmonary effort is normal. No respiratory distress.     Breath sounds: Normal breath sounds.  Abdominal:     Palpations: Abdomen is soft. There is no hepatomegaly or mass.     Tenderness: There is no abdominal tenderness.  Musculoskeletal:     Right lower leg: 2+ Pitting Edema present.     Left lower leg: 2+ Pitting Edema present.  Lymphadenopathy:     Cervical: No cervical adenopathy.  Skin:    General: Skin is warm.     Findings: No erythema or rash.  Neurological:     Mental Status: He is alert and oriented to person, place, and time.     Cranial Nerves: No cranial nerve deficit.     Gait: Gait normal.  Psychiatric:        Mood and Affect: Mood and affect normal.   ASSESSMENT AND PLAN: Mr. Craige was seen today for hospital follow up.  Acute kidney injury superimposed on chronic kidney disease (HCC) Assessment & Plan: Creatinine is still  not at his baseline but slowly improving. Continue low-salt diet, adequate hydration, and avoidance of NSAIDs. He has not seen his nephrologist since last year, he is planned to arrange a follow-up appointment. Further recommendation will be given according to lab results.  Orders: -     Basic metabolic panel; Future -     CBC; Future -     Ambulatory referral to Home Health  HFrEF (heart failure with reduced ejection fraction) (HCC) Assessment & Plan: He had an echo done yesterday, LVEF 20 to 25% (30 to 35% in 10/2022).  He is symptomatic with orthopnea and PND. Recommend resuming furosemide at 20 mg daily, we will adjust dose according to lab results. Continue annual low-salt diet and avoid drinking more than 1 to 1.2 L/day of fluids. Depending of renal function we could consider adding Jardiance 10 mg daily. Continue metoprolol succinate 100 mg daily. Pending appointment with cardiologist. Clearly instructed about warning signs.  Orders: -     Basic metabolic panel; Future -     Brain natriuretic peptide -     CBC; Future -     Ambulatory referral to Home Health  Anemia due to stage 4 chronic kidney disease [facility]) Assessment & Plan: Anemia chronic disease. Last age/H11.8/36.0 on 01/28/2023. Further recommendation will be given according to CBC results.  Nausea without vomiting Mild. Recommend trying Zofran to 2 formed milligrams twice daily as needed. Small sips of fluids and food portions throughout the day.  -     Ondansetron HCl; Take 0.5-1 tablets (2-4 mg total) by mouth every 12 (twelve) hours as needed for nausea or vomiting. (Patient not taking: Reported on 02/04/2023)  Dispense: 10 tablet; Refill: 0  Primary hypertension Assessment & Plan: BP today is adequate, lower normal. Continue monitoring BP at home, if lower readings we need to consider decreasing dose of metoprolol succinate. Continue hydralazine 10 mg  3 times daily, Imdur 30 mg daily, and metoprolol  succinate 100 mg daily.  Atrial fibrillation with RVR (HCC) Assessment & Plan: Continue metoprolol succinate 100 mg daily and Eliquis 2.5 mg twice daily. We discussed some side effects of medications. Following with cardiologist.  I spent a total of 53 minutes in both face to face and non face to face activities for this visit on the date of this encounter. During this time history was obtained and documented, examination was performed, prior labs/imaging reviewed, and assessment/plan discussed.  Return if symptoms worsen or fail to improve, for keep next appointment.  Eldene Plocher G. [place], MD  [facility]. [place] office.

## 2023-02-02 NOTE — Transitions of Care (Post Inpatient/ED Visit) (Signed)
   02/02/2023  Name: Douglas Matthews MRN: [id number] DOB: [birth year]  Today's TOC FU Call Status: Today's TOC FU Call Status:: Successful TOC FU Call Competed TOC FU Call Complete Date: 02/02/23  Transition Care Management Follow-up Telephone Call Date of Discharge: 01/30/23 Discharge Facility: [facility]) Type of Discharge: Inpatient Admission Primary Inpatient Discharge Diagnosis:: "hyperkalemia,ARF" How have you been since you were released from the hospital?: Better (Call completed with spouse-she thinks patient is getting stronger, appetite slightly improving. He has not complained of any issues since returning home.) Any questions or concerns?: Yes Patient Questions/Concerns:: Spouse concerned about all the meds that patient is takign and if he could discontinue some of them Patient Questions/Concerns Addressed: Other: (Advised spouse to discuss with PCP during follow up appt tomorrow)  Items Reviewed: Did you receive and understand the discharge instructions provided?: Yes Medications obtained and verified?: Yes (Medications Reviewed) (spouse did not pick up new med(Metoprolol) yet-will pick it up today) Any new allergies since your discharge?: No Dietary orders reviewed?: Yes Type of Diet Ordered:: low slat/heart healthy Do you have support at home?: Yes People in Home: spouse Name of Support/Comfort Primary Source: [place] and Equipment/Supplies: [place] Ordered?: NA Any new equipment or medical supplies ordered?: NA  Functional Questionnaire: Do you need assistance with bathing/showering or dressing?: No Do you need assistance with meal preparation?: Yes Do you need assistance with eating?: No Do you have difficulty maintaining continence: No Do you need assistance with getting out of bed/getting out of a chair/moving?: No Do you have difficulty managing or taking your medications?: Yes  Folllow up appointments reviewed: PCP Follow-up  appointment confirmed?: Yes Date of PCP follow-up appointment?: 02/03/23 Follow-up Provider: Dr. [place] Specialist [facility] Follow-up appointment confirmed?: NA Do you need transportation to your follow-up appointment?: No Do you understand care options if your condition(s) worsen?: Yes-patient verbalized understanding  SDOH Interventions Today    Flowsheet Row Most Recent Value  SDOH Interventions   Food Insecurity Interventions Intervention Not Indicated  Transportation Interventions Intervention Not Indicated      TOC Interventions Today    Flowsheet Row Most Recent Value  TOC Interventions   TOC Interventions Discussed/Reviewed TOC Interventions Discussed, Post discharge activity limitations per provider      Interventions Today    Flowsheet Row Most Recent Value  Education Interventions   Education Provided Provided Education  [BP & cardiac monitoirng and mgmt]  Provided Verbal Education On Nutrition, When to see the doctor  Nutrition Interventions   Nutrition Discussed/Reviewed Nutrition Discussed, Decreasing salt  Pharmacy Interventions   Pharmacy Dicussed/Reviewed Pharmacy Topics Discussed, Medications and their functions  Safety Interventions   Safety Discussed/Reviewed Safety Discussed, Fall Risk       Hetty Blend [facility] Care Management Care Management Community Coordinator Direct Phone: [phone] Toll Free: [phone] Fax: [phone]

## 2023-02-03 ENCOUNTER — Telehealth: Payer: Self-pay | Admitting: Internal Medicine

## 2023-02-03 ENCOUNTER — Other Ambulatory Visit: Payer: Self-pay

## 2023-02-03 ENCOUNTER — Encounter: Payer: Self-pay | Admitting: Family Medicine

## 2023-02-03 ENCOUNTER — Inpatient Hospital Stay (HOSPITAL_COMMUNITY)
Admission: EM | Admit: 2023-02-03 | Discharge: 2023-02-07 | DRG: 291 | Disposition: A | Discharge status: Home Health | Payer: Medicare Other | Attending: Internal Medicine | Admitting: Internal Medicine

## 2023-02-03 ENCOUNTER — Ambulatory Visit (INDEPENDENT_AMBULATORY_CARE_PROVIDER_SITE_OTHER): Payer: Medicare Other | Admitting: Family Medicine

## 2023-02-03 ENCOUNTER — Encounter (HOSPITAL_COMMUNITY): Payer: Self-pay

## 2023-02-03 VITALS — BP 110/70 | HR 94 | Resp 16 | Ht 68.0 in | Wt 150.2 lb

## 2023-02-03 DIAGNOSIS — Z79899 Other long term (current) drug therapy: Secondary | ICD-10-CM

## 2023-02-03 DIAGNOSIS — Z66 Do not resuscitate: Secondary | ICD-10-CM | POA: Diagnosis not present

## 2023-02-03 DIAGNOSIS — I5020 Unspecified systolic (congestive) heart failure: Secondary | ICD-10-CM

## 2023-02-03 DIAGNOSIS — I132 Hypertensive heart and chronic kidney disease with heart failure and with stage 5 chronic kidney disease, or end stage renal disease: Principal | ICD-10-CM | POA: Diagnosis present

## 2023-02-03 DIAGNOSIS — N179 Acute kidney failure, unspecified: Secondary | ICD-10-CM | POA: Diagnosis not present

## 2023-02-03 DIAGNOSIS — N189 Chronic kidney disease, unspecified: Secondary | ICD-10-CM

## 2023-02-03 DIAGNOSIS — R110 Nausea: Secondary | ICD-10-CM | POA: Diagnosis not present

## 2023-02-03 DIAGNOSIS — N400 Enlarged prostate without lower urinary tract symptoms: Secondary | ICD-10-CM | POA: Diagnosis present

## 2023-02-03 DIAGNOSIS — I5082 Biventricular heart failure: Secondary | ICD-10-CM | POA: Diagnosis present

## 2023-02-03 DIAGNOSIS — D649 Anemia, unspecified: Secondary | ICD-10-CM | POA: Diagnosis present

## 2023-02-03 DIAGNOSIS — Z7901 Long term (current) use of anticoagulants: Secondary | ICD-10-CM

## 2023-02-03 DIAGNOSIS — I429 Cardiomyopathy, unspecified: Secondary | ICD-10-CM | POA: Diagnosis present

## 2023-02-03 DIAGNOSIS — R392 Extrarenal uremia: Secondary | ICD-10-CM | POA: Diagnosis not present

## 2023-02-03 DIAGNOSIS — D631 Anemia in chronic kidney disease: Secondary | ICD-10-CM

## 2023-02-03 DIAGNOSIS — I4891 Unspecified atrial fibrillation: Secondary | ICD-10-CM

## 2023-02-03 DIAGNOSIS — Z87891 Personal history of nicotine dependence: Secondary | ICD-10-CM

## 2023-02-03 DIAGNOSIS — R570 Cardiogenic shock: Secondary | ICD-10-CM | POA: Diagnosis not present

## 2023-02-03 DIAGNOSIS — I10 Essential (primary) hypertension: Secondary | ICD-10-CM | POA: Diagnosis not present

## 2023-02-03 DIAGNOSIS — I4892 Unspecified atrial flutter: Secondary | ICD-10-CM | POA: Diagnosis present

## 2023-02-03 DIAGNOSIS — I447 Left bundle-branch block, unspecified: Secondary | ICD-10-CM | POA: Diagnosis present

## 2023-02-03 DIAGNOSIS — I5043 Acute on chronic combined systolic (congestive) and diastolic (congestive) heart failure: Secondary | ICD-10-CM | POA: Diagnosis present

## 2023-02-03 DIAGNOSIS — I4819 Other persistent atrial fibrillation: Secondary | ICD-10-CM | POA: Diagnosis present

## 2023-02-03 DIAGNOSIS — Z8701 Personal history of pneumonia (recurrent): Secondary | ICD-10-CM

## 2023-02-03 DIAGNOSIS — Z515 Encounter for palliative care: Secondary | ICD-10-CM

## 2023-02-03 DIAGNOSIS — N19 Unspecified kidney failure: Secondary | ICD-10-CM

## 2023-02-03 DIAGNOSIS — N184 Chronic kidney disease, stage 4 (severe): Secondary | ICD-10-CM

## 2023-02-03 DIAGNOSIS — N185 Chronic kidney disease, stage 5: Secondary | ICD-10-CM | POA: Diagnosis present

## 2023-02-03 DIAGNOSIS — E785 Hyperlipidemia, unspecified: Secondary | ICD-10-CM | POA: Diagnosis present

## 2023-02-03 DIAGNOSIS — I081 Rheumatic disorders of both mitral and tricuspid valves: Secondary | ICD-10-CM | POA: Diagnosis present

## 2023-02-03 DIAGNOSIS — E872 Acidosis, unspecified: Secondary | ICD-10-CM | POA: Diagnosis present

## 2023-02-03 HISTORY — DX: Unspecified atrial fibrillation: I48.91

## 2023-02-03 HISTORY — DX: Chronic kidney disease, unspecified: N18.9

## 2023-02-03 LAB — CBC
HCT: 36.7 % — ABNORMAL LOW (ref 39.0–52.0)
Hemoglobin: 12 g/dL — ABNORMAL LOW (ref 13.0–17.0)
MCHC: 32.6 g/dL (ref 30.0–36.0)
MCV: 88.1 fl (ref 78.0–100.0)
Platelets: 175 10*3/uL (ref 150.0–400.0)
RBC: 4.17 Mil/uL — ABNORMAL LOW (ref 4.22–5.81)
RDW: 16.8 % — ABNORMAL HIGH (ref 11.5–15.5)
WBC: 5.1 10*3/uL (ref 4.0–10.5)

## 2023-02-03 LAB — BASIC METABOLIC PANEL
Anion gap: 9 (ref 5–15)
BUN: 117 mg/dL (ref 6–23)
BUN: 116 mg/dL — ABNORMAL HIGH (ref 8–23)
CO2: 20 mEq/L (ref 19–32)
CO2: 22 mmol/L (ref 22–32)
Calcium: 8.8 mg/dL (ref 8.4–10.5)
Calcium: 8.7 mg/dL — ABNORMAL LOW (ref 8.9–10.3)
Chloride: 109 mEq/L (ref 96–112)
Chloride: 110 mmol/L (ref 98–111)
Creatinine, Ser: 4.02 mg/dL — ABNORMAL HIGH (ref 0.40–1.50)
Creatinine, Ser: 4.31 mg/dL — ABNORMAL HIGH (ref 0.61–1.24)
GFR, Estimated: 13 mL/min — ABNORMAL LOW (ref >60)
GFR: 12.95 mL/min — CL (ref >60.00)
Glucose, Bld: 86 mg/dL (ref 70–99)
Glucose, Bld: 113 mg/dL — ABNORMAL HIGH (ref 70–99)
Potassium: 4.7 mEq/L (ref 3.5–5.1)
Potassium: 5 mmol/L (ref 3.5–5.1)
Sodium: 142 mEq/L (ref 135–145)
Sodium: 141 mmol/L (ref 135–145)

## 2023-02-03 LAB — CBC WITH DIFFERENTIAL/PLATELET
Abs Immature Granulocytes: 0.02 10*3/uL (ref 0.00–0.07)
Basophils Absolute: 0 10*3/uL (ref 0.0–0.1)
Basophils Relative: 0 %
Eosinophils Absolute: 0.1 10*3/uL (ref 0.0–0.5)
Eosinophils Relative: 1 %
HCT: 37.5 % — ABNORMAL LOW (ref 39.0–52.0)
Hemoglobin: 11.8 g/dL — ABNORMAL LOW (ref 13.0–17.0)
Immature Granulocytes: 0 %
Lymphocytes Relative: 16 %
Lymphs Abs: 0.9 10*3/uL (ref 0.7–4.0)
MCH: 28.4 pg (ref 26.0–34.0)
MCHC: 31.5 g/dL (ref 30.0–36.0)
MCV: 90.1 fL (ref 80.0–100.0)
Monocytes Absolute: 0.9 10*3/uL (ref 0.1–1.0)
Monocytes Relative: 17 %
Neutro Abs: 3.4 10*3/uL (ref 1.7–7.7)
Neutrophils Relative %: 66 %
Platelets: 188 10*3/uL (ref 150–400)
RBC: 4.16 MIL/uL — ABNORMAL LOW (ref 4.22–5.81)
RDW: 16.5 % — ABNORMAL HIGH (ref 11.5–15.5)
WBC: 5.2 10*3/uL (ref 4.0–10.5)
nRBC: 0 % (ref 0.0–0.2)

## 2023-02-03 LAB — BRAIN NATRIURETIC PEPTIDE: Pro B Natriuretic peptide (BNP): 1760 pg/mL — ABNORMAL HIGH (ref 0.0–100.0)

## 2023-02-03 MED ORDER — ONDANSETRON HCL 4 MG PO TABS: 2.0000 mg | ORAL_TABLET | Freq: Two times a day (BID) | ORAL | 0 refills | Status: DC | PRN

## 2023-02-03 NOTE — ED Provider Triage Note (Signed)
Emergency Medicine Provider Triage Evaluation Note  Douglas Matthews , a 89 y.o. male  was evaluated in triage.  Pt complains of abnormal labs.  Patient reports that he was advised to come to the emergency department after some lab results that he had performed earlier today were abnormal.  Was recently hospitalized for acute renal failure superimposed on stage IV chronic kidney disease.  Patient reports that he is taking Lasix for congestive heart failure but was advised by his PCP earlier today cut down a half a tablet.  Denies any urinary symptoms, shortness of breath, chest pain, abdominal pain, nausea, vomiting, diarrhea.  Review of Systems  Positive: As above Negative: As above  Physical Exam  BP 115/86   Pulse 99   Temp 97.7 F (36.5 C) (Axillary)   Resp 18   Ht '5\' 8"'$  (1.727 m)   Wt 68.2 kg   SpO2 100%   BMI 22.86 kg/m  Gen:   Awake, no distress   Resp:  Normal effort  MSK:   Moves extremities without difficulty  Other:    Medical Decision Making  Medically screening exam initiated at 7:42 PM.  Appropriate orders placed.  Douglas Matthews was informed that the remainder of the evaluation will be completed by another provider, this initial triage assessment does not replace that evaluation, and the importance of remaining in the ED until their evaluation is complete.     Luvenia Heller, PA-C 02/03/23 1943

## 2023-02-03 NOTE — ED Triage Notes (Signed)
Pt states PCP sent him due to abnormal kidney function blood work results. Pt is not sure what the results were. Pt denies any complaints at this time.

## 2023-02-03 NOTE — Telephone Encounter (Signed)
On call: critical lab value BUN 117, GFR 12. Recently admitted for acute on CKD. Cr up to 4 from 3 on DC 4 days ago. Have advised ED evaluation tonight.  Domingo Mend, MD [facility]

## 2023-02-03 NOTE — Patient Instructions (Addendum)
A few things to remember from today's visit:  Acute kidney injury superimposed on chronic kidney disease ([place]) - Plan: Basic metabolic panel, CBC, Ambulatory referral to Home Health  HFrEF (heart failure with reduced ejection fraction) ([place]) - Plan: Basic metabolic panel, Brain Natriuretic Peptide, CBC, Ambulatory referral to Home Health  Anemia due to stage 4 chronic kidney disease ([place])  Start Furosemide 40 mg 1/2 tab daily. No changes in rest. Monitor blood pressure every 1-2 days.  If you need refills for medications you take chronically, please call your pharmacy. Do not use My Chart to request refills or for acute issues that need immediate attention. If you send a my chart message, it may take a few days to be addressed, specially if I am not in the office.  Please be sure medication list is accurate. If a new problem present, please set up appointment sooner than planned today.

## 2023-02-04 ENCOUNTER — Encounter (HOSPITAL_COMMUNITY): Payer: Medicare Other

## 2023-02-04 ENCOUNTER — Emergency Department (HOSPITAL_COMMUNITY): Payer: Medicare Other

## 2023-02-04 ENCOUNTER — Encounter (HOSPITAL_COMMUNITY): Payer: Self-pay | Admitting: Internal Medicine

## 2023-02-04 ENCOUNTER — Other Ambulatory Visit: Payer: Self-pay

## 2023-02-04 ENCOUNTER — Telehealth: Payer: Self-pay | Admitting: Family Medicine

## 2023-02-04 DIAGNOSIS — E872 Acidosis, unspecified: Secondary | ICD-10-CM | POA: Diagnosis present

## 2023-02-04 DIAGNOSIS — I429 Cardiomyopathy, unspecified: Secondary | ICD-10-CM | POA: Diagnosis present

## 2023-02-04 DIAGNOSIS — I5043 Acute on chronic combined systolic (congestive) and diastolic (congestive) heart failure: Secondary | ICD-10-CM | POA: Diagnosis present

## 2023-02-04 DIAGNOSIS — D649 Anemia, unspecified: Secondary | ICD-10-CM | POA: Diagnosis not present

## 2023-02-04 DIAGNOSIS — I4819 Other persistent atrial fibrillation: Secondary | ICD-10-CM | POA: Diagnosis present

## 2023-02-04 DIAGNOSIS — I132 Hypertensive heart and chronic kidney disease with heart failure and with stage 5 chronic kidney disease, or end stage renal disease: Secondary | ICD-10-CM | POA: Diagnosis present

## 2023-02-04 DIAGNOSIS — I5020 Unspecified systolic (congestive) heart failure: Secondary | ICD-10-CM | POA: Diagnosis not present

## 2023-02-04 DIAGNOSIS — N179 Acute kidney failure, unspecified: Secondary | ICD-10-CM | POA: Diagnosis present

## 2023-02-04 DIAGNOSIS — I5082 Biventricular heart failure: Secondary | ICD-10-CM | POA: Diagnosis present

## 2023-02-04 DIAGNOSIS — D631 Anemia in chronic kidney disease: Secondary | ICD-10-CM | POA: Diagnosis present

## 2023-02-04 DIAGNOSIS — I081 Rheumatic disorders of both mitral and tricuspid valves: Secondary | ICD-10-CM | POA: Diagnosis present

## 2023-02-04 DIAGNOSIS — N189 Chronic kidney disease, unspecified: Secondary | ICD-10-CM | POA: Diagnosis not present

## 2023-02-04 DIAGNOSIS — Z66 Do not resuscitate: Secondary | ICD-10-CM | POA: Diagnosis not present

## 2023-02-04 DIAGNOSIS — Z8701 Personal history of pneumonia (recurrent): Secondary | ICD-10-CM | POA: Diagnosis not present

## 2023-02-04 DIAGNOSIS — E785 Hyperlipidemia, unspecified: Secondary | ICD-10-CM | POA: Diagnosis present

## 2023-02-04 DIAGNOSIS — N19 Unspecified kidney failure: Secondary | ICD-10-CM | POA: Diagnosis present

## 2023-02-04 DIAGNOSIS — R570 Cardiogenic shock: Secondary | ICD-10-CM | POA: Diagnosis not present

## 2023-02-04 DIAGNOSIS — R0989 Other specified symptoms and signs involving the circulatory and respiratory systems: Secondary | ICD-10-CM | POA: Diagnosis not present

## 2023-02-04 DIAGNOSIS — N400 Enlarged prostate without lower urinary tract symptoms: Secondary | ICD-10-CM | POA: Diagnosis present

## 2023-02-04 DIAGNOSIS — I4892 Unspecified atrial flutter: Secondary | ICD-10-CM | POA: Diagnosis present

## 2023-02-04 DIAGNOSIS — I5023 Acute on chronic systolic (congestive) heart failure: Secondary | ICD-10-CM | POA: Diagnosis not present

## 2023-02-04 DIAGNOSIS — J90 Pleural effusion, not elsewhere classified: Secondary | ICD-10-CM | POA: Diagnosis not present

## 2023-02-04 DIAGNOSIS — N185 Chronic kidney disease, stage 5: Secondary | ICD-10-CM | POA: Diagnosis present

## 2023-02-04 DIAGNOSIS — Z515 Encounter for palliative care: Secondary | ICD-10-CM | POA: Diagnosis not present

## 2023-02-04 DIAGNOSIS — Z79899 Other long term (current) drug therapy: Secondary | ICD-10-CM | POA: Diagnosis not present

## 2023-02-04 DIAGNOSIS — Z87891 Personal history of nicotine dependence: Secondary | ICD-10-CM | POA: Diagnosis not present

## 2023-02-04 DIAGNOSIS — I130 Hypertensive heart and chronic kidney disease with heart failure and stage 1 through stage 4 chronic kidney disease, or unspecified chronic kidney disease: Secondary | ICD-10-CM | POA: Diagnosis not present

## 2023-02-04 DIAGNOSIS — I10 Essential (primary) hypertension: Secondary | ICD-10-CM | POA: Diagnosis not present

## 2023-02-04 DIAGNOSIS — Z7901 Long term (current) use of anticoagulants: Secondary | ICD-10-CM

## 2023-02-04 DIAGNOSIS — Z7189 Other specified counseling: Secondary | ICD-10-CM | POA: Diagnosis not present

## 2023-02-04 DIAGNOSIS — R110 Nausea: Secondary | ICD-10-CM | POA: Diagnosis not present

## 2023-02-04 DIAGNOSIS — N1832 Chronic kidney disease, stage 3b: Secondary | ICD-10-CM | POA: Diagnosis not present

## 2023-02-04 DIAGNOSIS — I447 Left bundle-branch block, unspecified: Secondary | ICD-10-CM | POA: Diagnosis present

## 2023-02-04 LAB — COMPREHENSIVE METABOLIC PANEL
ALT: 71 U/L — ABNORMAL HIGH (ref 0–44)
AST: 48 U/L — ABNORMAL HIGH (ref 15–41)
Albumin: 3 g/dL — ABNORMAL LOW (ref 3.5–5.0)
Alkaline Phosphatase: 136 U/L — ABNORMAL HIGH (ref 38–126)
Anion gap: 14 (ref 5–15)
BUN: 124 mg/dL — ABNORMAL HIGH (ref 8–23)
CO2: 16 mmol/L — ABNORMAL LOW (ref 22–32)
Calcium: 8.5 mg/dL — ABNORMAL LOW (ref 8.9–10.3)
Chloride: 111 mmol/L (ref 98–111)
Creatinine, Ser: 4 mg/dL — ABNORMAL HIGH (ref 0.61–1.24)
GFR, Estimated: 14 mL/min — ABNORMAL LOW (ref >60)
Glucose, Bld: 87 mg/dL (ref 70–99)
Potassium: 4.7 mmol/L (ref 3.5–5.1)
Sodium: 141 mmol/L (ref 135–145)
Total Bilirubin: 0.9 mg/dL (ref 0.3–1.2)
Total Protein: 6.1 g/dL — ABNORMAL LOW (ref 6.5–8.1)

## 2023-02-04 LAB — CBC
HCT: 35.5 % — ABNORMAL LOW (ref 39.0–52.0)
Hemoglobin: 11.7 g/dL — ABNORMAL LOW (ref 13.0–17.0)
MCH: 29.3 pg (ref 26.0–34.0)
MCHC: 33 g/dL (ref 30.0–36.0)
MCV: 89 fL (ref 80.0–100.0)
Platelets: 168 10*3/uL (ref 150–400)
RBC: 3.99 MIL/uL — ABNORMAL LOW (ref 4.22–5.81)
RDW: 16.4 % — ABNORMAL HIGH (ref 11.5–15.5)
WBC: 4.6 10*3/uL (ref 4.0–10.5)
nRBC: 0 % (ref 0.0–0.2)

## 2023-02-04 LAB — URINALYSIS, ROUTINE W REFLEX MICROSCOPIC
Bacteria, UA: NONE SEEN
Bilirubin Urine: NEGATIVE
Glucose, UA: NEGATIVE mg/dL
Hgb urine dipstick: NEGATIVE
Ketones, ur: NEGATIVE mg/dL
Leukocytes,Ua: NEGATIVE
Nitrite: NEGATIVE
Protein, ur: 30 mg/dL — AB
Specific Gravity, Urine: 1.012 (ref 1.005–1.030)
pH: 5 (ref 5.0–8.0)

## 2023-02-04 LAB — BRAIN NATRIURETIC PEPTIDE: B Natriuretic Peptide: 878 pg/mL — ABNORMAL HIGH (ref 0.0–100.0)

## 2023-02-04 LAB — MAGNESIUM: Magnesium: 2.4 mg/dL (ref 1.7–2.4)

## 2023-02-04 MED ORDER — MULTIVITAMINS PO CAPS: 1.0000 | ORAL_CAPSULE | Freq: Every day | ORAL | Status: DC

## 2023-02-04 MED ORDER — ACETAMINOPHEN 325 MG PO TABS
650.0000 mg | ORAL_TABLET | Freq: Four times a day (QID) | ORAL | Status: DC | PRN
  Filled 2023-02-04: qty 2

## 2023-02-04 MED ORDER — ACETAMINOPHEN 650 MG RE SUPP: 650.0000 mg | Freq: Four times a day (QID) | RECTAL | Status: DC | PRN

## 2023-02-04 MED ORDER — FUROSEMIDE 10 MG/ML IJ SOLN
60.0000 mg | Freq: Two times a day (BID) | INTRAMUSCULAR | Status: DC
  Administered 2023-02-04: 60 mg via INTRAVENOUS
  Filled 2023-02-04: qty 6

## 2023-02-04 MED ORDER — ADULT MULTIVITAMIN W/MINERALS CH
1.0000 | ORAL_TABLET | Freq: Every day | ORAL | Status: DC
  Administered 2023-02-04 – 2023-02-07 (×4): 1 via ORAL
  Filled 2023-02-04 (×4): qty 1

## 2023-02-04 MED ORDER — ISOSORBIDE MONONITRATE ER 30 MG PO TB24
30.0000 mg | ORAL_TABLET | Freq: Every day | ORAL | Status: DC
  Administered 2023-02-04 – 2023-02-07 (×4): 30 mg via ORAL
  Filled 2023-02-04 (×4): qty 1

## 2023-02-04 MED ORDER — FUROSEMIDE 10 MG/ML IJ SOLN
60.0000 mg | Freq: Once | INTRAMUSCULAR | Status: AC
  Administered 2023-02-04: 60 mg via INTRAVENOUS
  Filled 2023-02-04: qty 6

## 2023-02-04 MED ORDER — SODIUM CHLORIDE 0.9% FLUSH
3.0000 mL | Freq: Two times a day (BID) | INTRAVENOUS | Status: DC
  Administered 2023-02-04 – 2023-02-07 (×7): 3 mL via INTRAVENOUS

## 2023-02-04 MED ORDER — METOPROLOL SUCCINATE ER 100 MG PO TB24
100.0000 mg | ORAL_TABLET | Freq: Every day | ORAL | Status: DC
  Administered 2023-02-04 – 2023-02-05 (×2): 100 mg via ORAL
  Filled 2023-02-04: qty 4
  Filled 2023-02-04: qty 1

## 2023-02-04 MED ORDER — APIXABAN 2.5 MG PO TABS
2.5000 mg | ORAL_TABLET | Freq: Two times a day (BID) | ORAL | Status: DC
  Administered 2023-02-04 – 2023-02-07 (×7): 2.5 mg via ORAL
  Filled 2023-02-04 (×7): qty 1

## 2023-02-04 MED ORDER — ALBUTEROL SULFATE (2.5 MG/3ML) 0.083% IN NEBU: 2.5000 mg | INHALATION_SOLUTION | Freq: Four times a day (QID) | RESPIRATORY_TRACT | Status: DC | PRN

## 2023-02-04 NOTE — Assessment & Plan Note (Signed)
Anemia chronic disease. Last age/H11.8/36.0 on 01/28/2023. Further recommendation will be given according to CBC results.

## 2023-02-04 NOTE — Telephone Encounter (Signed)
Wife called wants Dr to know the home health agency called today. She is concerned that it will only be covered by medicare advantage, which she states the patient does not have.

## 2023-02-04 NOTE — Consult Note (Signed)
[facility]  HISTORY AND PHYSICAL  Douglas Matthews is an 88 y.o. male.    Chief Complaint: LE edema and AKI  HPI: Pt is an 66M with a PMH sig for HTN, HLD, Afib, and CKD IV/V followed by Dr Marval Regal who is now seen in consultation at there quest of Dr. Tamala Julian for eval and recs re: AKI on CKD.  Pt was recently admitted to [facility] 2/27-01/30/23 for mild AKI related to n/v and pre-renal insults.  Also had higher Afib burden.  Metop increased and then was discharged on prn Lasix.   Returns for LE and recurrent AKI.  Saw PCP yeserday- Cr was 4, BUN 116 and told to come to ED.    In this setting we are asked to see.  No f/c, n/v, CP. Some mild SOB.  Recent TTE 02/02/23 showed EF 20-25%, global hypokinesis, severely reduced RV function, moderately elevated pulm pressure.  PMH: Past Medical History:  Diagnosis Date   Anemia    Atrial fibrillation (HCC)    BPH (benign prostatic hypertrophy)    Chronic kidney disease    Diverticulosis    Hyperlipidemia    Hypertension    PSH: Past Surgical History:  Procedure Laterality Date   APPENDECTOMY     CARDIOVERSION N/A 10/29/2022   Procedure: CARDIOVERSION;  Surgeon: Lelon Perla, MD;  Location: [facility] ENDOSCOPY;  Service: Cardiovascular;  Laterality: N/A;   HERNIA REPAIR     TEE WITHOUT CARDIOVERSION N/A 10/29/2022   Procedure: TRANSESOPHAGEAL ECHOCARDIOGRAM (TEE);  Surgeon: Lelon Perla, MD;  Location: [facility] ENDOSCOPY;  Service: Cardiovascular;  Laterality: N/A;    Past Medical History:  Diagnosis Date   Anemia    Atrial fibrillation (HCC)    BPH (benign prostatic hypertrophy)    Chronic kidney disease    Diverticulosis    Hyperlipidemia    Hypertension     Medications:  Scheduled:  apixaban  2.5 mg Oral BID   furosemide  60 mg Intravenous BID   isosorbide mononitrate  30 mg Oral Daily   metoprolol succinate  100 mg Oral Daily   multivitamin with minerals  1 tablet Oral Daily   sodium chloride flush  3 mL  Intravenous Q12H    (Not in a hospital admission)   ALLERGIES:  No Known Allergies  FAM HX: Family History  Problem Relation Age of Onset   Heart disease Other     Social History:   reports that he has never smoked. He has never used smokeless tobacco. He reports that he does not drink alcohol and does not use drugs.  ROS: ROS: all other systems reviewed and are negative as per HPI  Blood pressure 115/85, pulse 89, temperature 98 F (36.7 C), resp. rate 17, height '5\' 8"'$  (1.727 m), weight 68.2 kg, SpO2 99 %. PHYSICAL EXAM: Physical Exam GEN older gentleman, NAD HEENT EOMI PERRL NECK + JVD PULM bibasilar crackles CV irregular ABD soft EXT 2+ LE edema, cool hands and feet NEURO AAO x 3, nonfocal, no uremic symptoms    Results for orders placed or performed during the hospital encounter of 02/03/23 (from the past 48 hour(s))  Basic metabolic panel     Status: Abnormal   Collection Time: 02/03/23  7:36 PM  Result Value Ref Range   Sodium 141 135 - 145 mmol/L   Potassium 5.0 3.5 - 5.1 mmol/L   Chloride 110 98 - 111 mmol/L   CO2 22 22 - 32 mmol/L   Glucose, Bld 113 (H)  70 - 99 mg/dL    Comment: Glucose reference range applies only to samples taken after fasting for at least 8 hours.   BUN 116 (H) 8 - 23 mg/dL   Creatinine, Ser 4.31 (H) 0.61 - 1.24 mg/dL   Calcium 8.7 (L) 8.9 - 10.3 mg/dL   GFR, Estimated 13 (L) >60 mL/min    Comment: (NOTE) Calculated using the CKD-EPI Creatinine Equation (2021)    Anion gap 9 5 - 15    Comment: Performed at [facility] [address], [place], [place] [address]  CBC with Differential     Status: Abnormal   Collection Time: 02/03/23  7:36 PM  Result Value Ref Range   WBC 5.2 4.0 - 10.5 K/uL   RBC 4.16 (L) 4.22 - 5.81 MIL/uL   Hemoglobin 11.8 (L) 13.0 - 17.0 g/dL   HCT 37.5 (L) 39.0 - 52.0 %   MCV 90.1 80.0 - 100.0 fL   MCH 28.4 26.0 - 34.0 pg   MCHC 31.5 30.0 - 36.0 g/dL   RDW 16.5 (H) 11.5 - 15.5 %   Platelets 188  150 - 400 K/uL   nRBC 0.0 0.0 - 0.2 %   Neutrophils Relative % 66 %   Neutro Abs 3.4 1.7 - 7.7 K/uL   Lymphocytes Relative 16 %   Lymphs Abs 0.9 0.7 - 4.0 K/uL   Monocytes Relative 17 %   Monocytes Absolute 0.9 0.1 - 1.0 K/uL   Eosinophils Relative 1 %   Eosinophils Absolute 0.1 0.0 - 0.5 K/uL   Basophils Relative 0 %   Basophils Absolute 0.0 0.0 - 0.1 K/uL   Immature Granulocytes 0 %   Abs Immature Granulocytes 0.02 0.00 - 0.07 K/uL    Comment: Performed at [facility], [address], [place], [place] [address]  Brain natriuretic peptide     Status: Abnormal   Collection Time: 02/03/23  7:36 PM  Result Value Ref Range   B Natriuretic Peptide 878.0 (H) 0.0 - 100.0 pg/mL    Comment: Performed at [facility] [address], [place], [place] [address]  Urinalysis, Routine w reflex microscopic -Urine, Clean Catch     Status: Abnormal   Collection Time: 02/04/23  3:18 AM  Result Value Ref Range   Color, Urine YELLOW YELLOW   APPearance CLEAR CLEAR   Specific Gravity, Urine 1.012 1.005 - 1.030   pH 5.0 5.0 - 8.0   Glucose, UA NEGATIVE NEGATIVE mg/dL   Hgb urine dipstick NEGATIVE NEGATIVE   Bilirubin Urine NEGATIVE NEGATIVE   Ketones, ur NEGATIVE NEGATIVE mg/dL   Protein, ur 30 (A) NEGATIVE mg/dL   Nitrite NEGATIVE NEGATIVE   Leukocytes,Ua NEGATIVE NEGATIVE   RBC / HPF 0-5 0 - 5 RBC/hpf   WBC, UA 0-5 0 - 5 WBC/hpf   Bacteria, UA NONE SEEN NONE SEEN   Squamous Epithelial / HPF 0-5 0 - 5 /HPF   Mucus PRESENT     Comment: Performed at [facility], [place] [address] [place], [place] [address]  CBC     Status: Abnormal   Collection Time: 02/04/23  9:54 AM  Result Value Ref Range   WBC 4.6 4.0 - 10.5 K/uL   RBC 3.99 (L) 4.22 - 5.81 MIL/uL   Hemoglobin 11.7 (L) 13.0 - 17.0 g/dL   HCT 35.5 (L) 39.0 - 52.0 %   MCV 89.0 80.0 - 100.0 fL   MCH 29.3 26.0 - 34.0 pg   MCHC  33.0 30.0 - 36.0 g/dL   RDW 16.4 (H) 11.5 - 15.5 %   Platelets 168 150 - 400 K/uL   nRBC 0.0  0.0 - 0.2 %    Comment: Performed at [facility], [place] [address], [place], [place] [address]    DG Chest 2 View  Result Date: 02/04/2023 CLINICAL DATA:  Bilateral basilar rales EXAM: CHEST - 2 VIEW COMPARISON:  01/27/2023 FINDINGS: Cardiac shadow is enlarged. Aortic calcifications are noted. Lungs are well aerated bilaterally. No focal infiltrate is seen. Small right-sided pleural effusion is noted. No bony abnormality is seen. IMPRESSION: Right-sided pleural effusion.  No other focal abnormality is noted. Electronically Signed   By: Inez Catalina M.D.   On: 02/04/2023 01:00   ECHOCARDIOGRAM COMPLETE  Result Date: 02/02/2023    ECHOCARDIOGRAM REPORT   Patient Name:   ARGEL CARTEE Date of Exam: 02/02/2023 Medical Rec #:  [id number]        Height:       68.0 in Accession #:    [id number]       Weight:       136.6 lb Date of Birth:  Sep 23, 1935       BSA:          1.738 m Patient Age:    40 years         BP:           126/68 mmHg Patient Gender: M                HR:           116 bpm. Exam Location:  [facility] Procedure: 2D Echo, Cardiac Doppler, Color Doppler and 3D Echo Indications:    I50.42 Chronic combined systolic (congestive) and diastolic                 (congestive) heart failure  History:        Patient has prior history of Echocardiogram examinations, most                 recent 10/29/2022. Arrythmias:Atrial Fibrillation, Atrial                 Flutter and LBBB; Risk Factors:Hypertension and Dyslipidemia.  Sonographer:    Diamond Nickel RCS Referring Phys: [place]  1. Left ventricular ejection fraction, by estimation, is 20 to 25%. The left ventricle has severely decreased function. The left ventricle demonstrates global hypokinesis. There is moderate left ventricular hypertrophy. Left ventricular diastolic parameters are indeterminate.  2. Right ventricular systolic function is severely reduced. The right ventricular size is normal. There is moderately elevated  pulmonary artery systolic pressure. The estimated right ventricular systolic pressure is 0000000 mmHg.  3. Left atrial size was mildly dilated.  4. Right atrial size was mildly dilated.  5. A small pericardial effusion is present.  6. The mitral valve is abnormal. Moderate mitral valve regurgitation. Appears functional. No evidence of mitral stenosis.  7. Tricuspid valve regurgitation is moderate to severe.  8. The aortic valve is calcified. There is moderate calcification of the aortic valve. Aortic valve regurgitation is trivial. Aortic valve sclerosis/calcification is present, without any evidence of aortic stenosis.  9. The inferior vena cava is dilated in size with <50% respiratory variability, suggesting right atrial pressure of 15 mmHg. FINDINGS  Left Ventricle: Left ventricular ejection fraction, by estimation, is 20 to 25%. The left ventricle has severely decreased function. The left ventricle demonstrates global hypokinesis. The  left ventricular internal cavity size was normal in size. There is moderate left ventricular hypertrophy. Left ventricular diastolic parameters are indeterminate. Right Ventricle: The right ventricular size is normal. No increase in right ventricular wall thickness. Right ventricular systolic function is severely reduced. There is moderately elevated pulmonary artery systolic pressure. The tricuspid regurgitant velocity is 2.97 m/s, and with an assumed right atrial pressure of 15 mmHg, the estimated right ventricular systolic pressure is 0000000 mmHg. Left Atrium: Left atrial size was mildly dilated. Right Atrium: Right atrial size was mildly dilated. Pericardium: A small pericardial effusion is present. Mitral Valve: The mitral valve is abnormal. Moderate mitral valve regurgitation. No evidence of mitral valve stenosis. Tricuspid Valve: The tricuspid valve is normal in structure. Tricuspid valve regurgitation is moderate to severe. Aortic Valve: The aortic valve is calcified. There is  moderate calcification of the aortic valve. Aortic valve regurgitation is trivial. Aortic valve sclerosis/calcification is present, without any evidence of aortic stenosis. Pulmonic Valve: The pulmonic valve was not well visualized. Pulmonic valve regurgitation is mild. Aorta: The aortic root and ascending aorta are structurally normal, with no evidence of dilitation. Venous: The inferior vena cava is dilated in size with less than 50% respiratory variability, suggesting right atrial pressure of 15 mmHg. IAS/Shunts: The interatrial septum was not well visualized.  LEFT VENTRICLE PLAX 2D LVIDd:         4.30 cm   Diastology LVIDs:         3.60 cm   LV e' medial:    4.31 cm/s LV PW:         1.60 cm   LV E/e' medial:  23.1 LV IVS:        1.60 cm   LV e' lateral:   8.10 cm/s LVOT diam:     2.00 cm   LV E/e' lateral: 12.3 LV SV:         24 LV SV Index:   14 LVOT Area:     3.14 cm                           3D Volume EF:                          3D EF:        20 %                          LV EDV:       172 ml                          LV ESV:       138 ml                          LV SV:        34 ml RIGHT VENTRICLE RV Basal diam:  3.20 cm RV S prime:     5.68 cm/s TAPSE (M-mode): 0.7 cm RVSP:           50.3 mmHg LEFT ATRIUM           Index        RIGHT ATRIUM            Index LA diam:      4.90 cm 2.82 cm/m   RA Pressure: 15.00 mmHg LA Vol (  A2C): 77.1 ml 44.36 ml/m  RA Area:     19.60 cm LA Vol (A4C): 45.4 ml 26.12 ml/m  RA Volume:   59.50 ml   34.23 ml/m  AORTIC VALVE LVOT Vmax:   72.97 cm/s LVOT Vmean:  43.800 cm/s LVOT VTI:    0.078 m  AORTA Ao Root diam: 3.70 cm Ao Asc diam:  3.50 cm MITRAL VALVE                  TRICUSPID VALVE MV Area (PHT): 6.67 cm       TR Peak grad:   35.3 mmHg MV Decel Time: 114 msec       TR Vmax:        297.00 cm/s MR Peak grad:    83.4 mmHg    Estimated RAP:  15.00 mmHg MR Mean grad:    45.7 mmHg    RVSP:           50.3 mmHg MR Vmax:         456.67 cm/s MR Vmean:        301.3 cm/s    SHUNTS MR PISA:         3.08 cm     Systemic VTI:  0.08 m MR PISA Eff ROA: 26 mm       Systemic Diam: 2.00 cm MR PISA Radius:  0.70 cm MV E velocity: 99.60 cm/s MV A velocity: 35.37 cm/s MV E/A ratio:  2.82 Oswaldo Milian MD Electronically signed by Oswaldo Milian MD Signature Date/Time: 02/02/2023/7:15:36 PM    Final     Assessment/Plan  AKI on CKD: Baseline cr is 3.  Recently had admission for hypovolemia, now with decompensated CHF.  Followed by Dr Marval Regal.  - TTE 02/02/23 shows severe biventricular heart failure  - suspect cardiorenal syndrome  - Agree with diuresing with IV Lasix  - possible that he has low output state- may be worth asking advanced heart failure for their input--? Inotrope candidate, would RHC change anything  - not uremic, no need for dialysis and with severity of heart failure is not likely to go well  2.  Acute on chronic systolic CHF  - as above  - on Imdur  3. Afib:  - was discharged with increased dose of metop 100 mg daily - on Eliquis as well  4.  Dispo: admitted   Madelon Lips 02/04/2023, 12:18 PM

## 2023-02-04 NOTE — ED Notes (Signed)
Pt used urinal. Pt in no distress. He is resting now with wife by bedside.

## 2023-02-04 NOTE — Progress Notes (Cosign Needed)
Transition of Care [facility]) - Emergency Department Mini Assessment   Patient Details  Name: Douglas Matthews MRN: [id number] Date of Birth: [birth year]  [facility] CM/SW Contact:    Fuller Mandril, RN Phone Number: 02/04/2023, 12:06 PM   Clinical Narrative: Pt sent over by PCP due to abnormal kidney function blood work results. From home with spouse.   ED Mini Assessment: What brought you to the Emergency Department? : (P) "I really don't know, I was sent over by primary doctor."  Barriers to Discharge: (P) Continued Medical Work up             Patient Contact and Communications        ,          Patient states their goals for this hospitalization and ongoing recovery are:: (P) find out what's going on and go home      Admission diagnosis:  AKI (acute kidney injury) ([place]) [N17.9] Patient Active Problem List   Diagnosis Date Noted   Persistent atrial fibrillation ([place]) 02/04/2023   Chronic anticoagulation 02/04/2023   Acute renal failure superimposed on stage 4 chronic kidney disease ([employer]) 01/28/2023   Atrial fibrillation, chronic ([place]) 01/28/2023   Heart failure with reduced ejection fraction ([place]) [id number]   Metabolic acidosis 123XX123   Nausea 01/28/2023   CAP (community acquired pneumonia) 10/25/2022   Acute hypoxemic respiratory failure ([place]) 10/25/2022   Acute on chronic systolic CHF (congestive heart failure) ([place]) 10/25/2022   Atrial fibrillation with RVR ([place]) 10/24/2022   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   Hypertensive retinopathy of both eyes 06/04/2020   Elevated alkaline phosphatase level 123XX123   Diastolic dysfunction [id number]   Acute kidney injury superimposed on chronic kidney disease ([place]) 04/22/2016   Hypertensive kidney disease with chronic kidney disease stage III ([place]) 04/22/2016   HLD (hyperlipidemia) 04/22/2016   Normocytic anemia 07/20/2007   HTN (hypertension) 07/20/2007   DIVERTICULOSIS, COLON 07/20/2007    BPH associated with nocturia 07/20/2007   PCP:  [place], [name], MD Pharmacy:   [id number] [place] [address]Phone: [phone]: [phone]

## 2023-02-04 NOTE — H&P (Addendum)
History and Physical    Patient: Douglas Matthews [id number] DOB: 1935-10-29 DOA: 02/03/2023 DOS: the patient was seen and examined on 02/04/2023 PCP: Martinique, [name], MD  Patient coming from: Home  Chief Complaint:  Chief Complaint  Patient presents with   abnormal labs   HPI: Douglas Matthews is a 88 y.o. male with medical history significant of hypertension, hyperlipidemia, HFrEF (last EF 20-25% on 02/02/23), atrial fibrillation/atrial flutter, anemia, CKD stage IV, and BPH due to abnormal labs.  Patient had just recently been hospitalized from 2/27-3/1 with acute renal failure superimposed on CKD stage IV thought secondary to nausea and vomiting associated with possible gastroenteritis.  Patient's creatinine initially been noted to be elevated up to 4.13 with elevated potassium of 6.  Patient was treated for hyperkalemia and received gentle IV fluids with improvement creatinine down to 3.04 prior to discharge.  After leaving the hospital he states that he noticed that his legs were swelling.  Denies having any further episodes of vomiting, but does note he still been intermittently nauseous. Patient does not drink much fluids at baseline. He does check his weight on a regular basis and states that his weight is up about 3 pounds from his norm.  Notes associated symptoms of shortness of breath with exertion.  Denies having any fever, chills, chest pain, abdominal pain, diarrhea, or blood in stools. Patient states that he still has been able to make urine. He is followed by Dr. Marval Regal of nephrology in the outpatient setting.  He does not have a fistula in place and states that they had talked about possible need of dialysis couple years ago, but it had not been brought up again. He followed up with his primary care provider yesterday from his recent hospitalization where labs were obtained and he was told to come to the hospital as his creatinine levels were again noted to be elevated.   In  the emergency department patient was noted to be afebrile with mild tachypnea and all other vital signs maintained.  Labs 3/5 significant for hemoglobin 11.8, potassium 5, CO2 22, BUN 116, creatinine 4.31, anion gap 9, BNP 878.  Chest x-ray noted a right-sided pleural effusion with no other focal abnormality appreciated.  Urinalysis noted no significant signs of infection.  Nephrology has been consulted and agree with Lasix 60 mg IV.  Review of Systems: As mentioned in the history of present illness. All other systems reviewed and are negative. Past Medical History:  Diagnosis Date   Anemia    Atrial fibrillation (HCC)    BPH (benign prostatic hypertrophy)    Chronic kidney disease    Diverticulosis    Hyperlipidemia    Hypertension    Past Surgical History:  Procedure Laterality Date   APPENDECTOMY     CARDIOVERSION N/A 10/29/2022   Procedure: CARDIOVERSION;  Surgeon: Lelon Perla, MD;  Location: [facility];  Service: Cardiovascular;  Laterality: N/A;   HERNIA REPAIR     TEE WITHOUT CARDIOVERSION N/A 10/29/2022   Procedure: TRANSESOPHAGEAL ECHOCARDIOGRAM (TEE);  Surgeon: Lelon Perla, MD;  Location: [facility] END[facility]COPY;  Service: Cardiovascular;  Laterality: N/A;   Social History:  reports that he has never smoked. He has never used smokeless tobacco. He reports that he does not drink alcohol and does not use drugs.  No Known Allergies  Family History  Problem Relation Age of Onset   Heart disease Other     Prior to Admission medications   Medication Sig Start Date End Date  Taking? Authorizing Provider  apixaban (ELIQUIS) 2.5 MG TABS tablet Take 1 tablet (2.5 mg total) by mouth 2 (two) times daily. 11/21/22  Yes Nahser, Wonda Cheng, MD  COD LIVER OIL PO Take 1 tablet by mouth daily.   Yes [provider]  furosemide (LASIX) 40 MG tablet Take 1 tablet (40 mg total) by mouth daily as needed for edema or fluid (as needed in case of weight gain 2 to 3 lbs in 24 hrs or 5 lbs  in 7 days, leg swelling or shortness of breath.). 11/21/22  Yes Nahser, Wonda Cheng, MD  GARLIC PO Take 1 tablet by mouth daily.   Yes [provider]  hydrALAZINE (APRESOLINE) 10 MG tablet Take 1 tablet (10 mg total) by mouth 3 (three) times daily. 11/21/22  Yes Nahser, Wonda Cheng, MD  metoprolol succinate (TOPROL XL) 100 MG 24 hr tablet Take 1 tablet (100 mg total) by mouth daily. 01/31/23  Yes Annita Brod, MD  Multiple Vitamin (MULTIVITAMIN) capsule Take 1 capsule by mouth daily.   Yes [provider]  Saw Palmetto 450 MG CAPS Take 900 mg by mouth daily.   Yes [provider]  isosorbide mononitrate (IMDUR) 30 MG 24 hr tablet Take 1 tablet (30 mg total) by mouth daily. 11/21/22   Nahser, Wonda Cheng, MD  ondansetron (ZOFRAN) 4 MG tablet Take 0.5-1 tablets (2-4 mg total) by mouth every 12 (twelve) hours as needed for nausea or vomiting. Patient not taking: Reported on 02/04/2023 02/03/23   Martinique, [name], MD    Physical Exam: Vitals:   02/04/23 0215 02/04/23 0400 02/04/23 0500 02/04/23 0653  BP: (!) 145/106 (!) 126/102 129/86   Pulse: 86 86 90   Resp: 10 (!) 23 13   Temp:    (!) 97.4 F (36.3 C)  TempSrc:    Oral  SpO2: 100% 100% 100%   Weight:      Height:      Constitutional: Elderly male currently in no acute distress Eyes: PERRL, lids and conjunctivae normal ENMT: Mucous membranes are moist.  Normal dentition.  Neck: normal, supple.  JVD present to the angle of the jaw Respiratory: clear to auscultation bilaterally, no wheezing, no crackles. Normal respiratory effort. No accessory muscle use.  Cardiovascular: Irregular irregular with at least +2 pitting bilateral lower extremity edema. Abdomen: no tenderness, no masses palpated.  Bowel sounds positive.  Musculoskeletal: no clubbing / cyanosis. No joint deformity upper and lower extremities. Good ROM, no contractures. Normal muscle tone.  Skin: no rashes, lesions, ulcers. No induration Neurologic: CN 2-12  grossly intact.  Strength 5/5 in all 4.  Psychiatric: Normal judgment and insight. Alert and oriented x 3. Normal mood.   Data Reviewed:  Atrial fibrillation at 96 bpm with a left bundle branch block. Review the labs and imaged.  Assessment and Plan:  Heart failure with reduced EF Acute on chronic.  Patient notes complaints of lower extremity swelling, shortness of breath with exertion, and reports of weight being up approximately 3 pounds.  On physical exam patient with JVD to the angle of the jaw and at least 2+ pitting bilateral lower extremity edema.  BNP elevated 878.  Chest x-ray revealed right-sided pleural effusion.  Last EF noted to be 20- 25% with indeterminate diastolic parameters.  Nephrology was consulted as symptoms were questioned to be related with worsening kidney function. -Admit to a telemetry bed -Heart failure order set utilized -Strict I&O's and daily weights -Lasix 60 mg IV twice daily -  Continue beta-blocker -Appreciate nephrology consultative services, will follow-up for any further recommendations.  Acute kidney injury superimposed on chronic kidney disease stage IV Creatinine noted to be elevated up to 4.31 with BUN 116.  Thought to be secondary to patient being fluid overloaded.  Question possibility needed patient being started on dialysis.  Nephrology was consulted. -Continue to monitor kidney function daily with diuresis -Appreciate nephrology consultative services we will follow-up for any further recommendations  Nausea Unresolved.  Patient reports still feeling nauseous, but denies any recent episodes of vomiting.Marland Kitchen  Possibly secondary to elevated BUN. -Antiemetics as needed  Normocytic anemia Chronic.  Hemoglobin 11.8 g/dL which appears around patient's baseline.  Patient denies any reports of bleeding.  Essential hypertension -Continue metoprolol and isosorbide mononitrate as tolerated -Add back hydralazine until medically appropriate  Persistent  atrial fibrillation on chronic anticoagulation Patient currently in atrial fibrillation but appears relatively rate controlled. -Continue Eliquis -Continue metoprolol for rate control  DVT prophylaxis Eliquis Advance Care Planning:   Code Status: Full Code    Consults: Nephrology  Family Communication: wife updated over the phone   Severity of Illness: The appropriate patient status for this patient is INPATIENT. Inpatient status is judged to be reasonable and necessary in order to provide the required intensity of service to ensure the patient's safety. The patient's presenting symptoms, physical exam findings, and initial radiographic and laboratory data in the context of their chronic comorbidities is felt to place them at high risk for further clinical deterioration. Furthermore, it is not anticipated that the patient will be medically stable for discharge from the hospital within 2 midnights of admission.   * I certify that at the point of admission it is my clinical judgment that the patient will require inpatient hospital care spanning beyond 2 midnights from the point of admission due to high intensity of service, high risk for further deterioration and high frequency of surveillance required.*  Author: Norval Morton, MD 02/04/2023 8:25 AM  For on call review [email or web address].

## 2023-02-04 NOTE — Telephone Encounter (Signed)
I called and spoke with patient's wife. I made her aware that [facility] wouldn't come out if it wasn't covered under the insurance. Pt's wife verbalized understanding.

## 2023-02-04 NOTE — Assessment & Plan Note (Addendum)
BP today is adequate, lower normal. Continue monitoring BP at home, if lower readings we need to consider decreasing dose of metoprolol succinate. Continue hydralazine 10 mg 3 times daily, Imdur 30 mg daily, and metoprolol succinate 100 mg daily.

## 2023-02-04 NOTE — Assessment & Plan Note (Signed)
Creatinine is still not at his baseline but slowly improving. Continue low-salt diet, adequate hydration, and avoidance of NSAIDs. He has not seen his nephrologist since last year, he is planned to arrange a follow-up appointment. Further recommendation will be given according to lab results.

## 2023-02-04 NOTE — ED Notes (Signed)
Pt resting, wife at bedside.

## 2023-02-04 NOTE — Consult Note (Signed)
Reason for Consult:AKI w/ CKD IV Referring Physician: Fuller Plan, MD  Chief Complaint: AKI w/ CKD IV  Assessment/Plan: AKI w/ CKD IV Patient comes in after recent hospitalization for ARF w/ CKD IV, w/ AKI. Patient d/c w. Cr 3.04, 4.31 on admission, Cr lab pending today. Cr rise thought to be 2/2 to volume overload status of patient, concerned that his home diuresis was insufficient. Will diurese today and continue to monitor.  -Continue IV lasix 60 mg x 1 -AM BMP  HFrEF Echo from 02/02/23 showed EF 20-25% w/ severely decreased LV function and global hypokinesis, RV systolic function severely reduced, w/ moderately elevated PA systolic pressures, w/ small  pericardial effusion. Patient appears volume overloaded on exam, w/ minimal crackles in lower lung fields, and 2+ pitting edema in LE.  -Continue IV lasix 60 mg x 1 -Strict I/O -K> 4.0 and Mg > 2.0 -Consider repeat echo -Plans per primary team Normocytic anemia Hgb 11.7 this am w/ baseline being around ~12 last admission. Likely 2/2 to CKD -Plans per primary team HTN BP [id number] systolic, well controlled.  -Plans per primary team A. Fib HR irregularly irregular this morning, patient currently in A. Fib, but rate controlled to 90's. Patient continued on home Eliquis and metoprolol.  -Plans per primary team   HPI: BLAISE FALANGA is an 88 y.o. male  with medical history significant of hypertension, hyperlipidemia, HFrEF (last EF 20-25% on 02/02/23), atrial fibrillation/atrial flutter, anemia, CKD stage IV, and BPH, presenting with an abnormal lab work. Patient states that he was seen by his primary care doctor and told him that his creatinine levels were high, and he should be seen in ED. Patient recently admitted 2/27-3/1 for ARF w/ CKD IV, thought to be 2/2 GI losses from gastroenteritis. He was treated w/ IVF with creatinine trending down to 3.04 from 4.13 on discharge. After hospitalization, patient noticed that he gained 3 lbs, had  SOB, and his legs started swelling, shortly after d/c.  Patient follows w/ Dr. Marval Regal for nephrology, noting they had spoken about the possibility of HD over a year ago, but it has not come up since.   On admission 3/5, labs were significant for creatinine 4.31, BUN 116, BNP 878. CXR showed right sided pleural effusion. He was started on 60 mg IV Lasix   Chemistry and CBC: Creatinine  Date/Time Value Ref Range Status  04/17/2022 12:00 AM 2.6 (A) 0.6 - 1.3 Final  08/26/2016 12:00 AM 2.4 (A) 0.6 - 1.3 mg/dL Final   Creat  Date/Time Value Ref Range Status  06/13/2020 01:46 PM 2.37 (H) 0.70 - 1.11 mg/dL Final    Comment:    For patients >77 years of age, the reference limit for Creatinine is approximately 13% higher for people identified as African-American. .    Creatinine, Ser  Date/Time Value Ref Range Status  02/03/2023 07:36 PM 4.31 (H) 0.61 - 1.24 mg/dL Final  02/03/2023 03:01 PM 4.02 (H) 0.40 - 1.50 mg/dL Final  01/30/2023 04:52 AM 3.04 (H) 0.61 - 1.24 mg/dL Final  01/29/2023 01:54 AM 3.54 (H) 0.61 - 1.24 mg/dL Final  01/28/2023 11:21 AM 3.59 (H) 0.61 - 1.24 mg/dL Final  01/28/2023 02:04 AM 3.84 (H) 0.61 - 1.24 mg/dL Final  01/27/2023 08:38 PM 3.93 (H) 0.61 - 1.24 mg/dL Final  01/27/2023 04:33 PM 4.13 (H) 0.61 - 1.24 mg/dL Final  11/11/2022 11:46 AM 2.71 (H) 0.76 - 1.27 mg/dL Final  11/03/2022 12:24 PM 2.78 (H) 0.76 - 1.27 mg/dL Final  10/30/2022  03:58 AM 2.95 (H) 0.61 - 1.24 mg/dL Final  10/29/2022 03:26 AM 2.75 (H) 0.61 - 1.24 mg/dL Final  10/28/2022 11:56 AM 2.46 (H) 0.61 - 1.24 mg/dL Final  10/27/2022 02:19 AM 2.43 (H) 0.61 - 1.24 mg/dL Final  10/25/2022 06:15 AM 2.15 (H) 0.61 - 1.24 mg/dL Final  10/24/2022 03:46 PM 2.41 (H) 0.61 - 1.24 mg/dL Final  04/29/2019 01:10 PM 2.12 (H) 0.40 - 1.50 mg/dL Final  04/19/2018 09:22 AM 1.93 (H) 0.40 - 1.50 mg/dL Final  04/22/2016 11:08 AM 2.09 (H) 0.40 - 1.50 mg/dL Final  04/26/2015 10:11 AM 2.18 (H) 0.40 - 1.50 mg/dL Final   01/09/2014 10:33 AM 2.2 (H) 0.4 - 1.5 mg/dL Final  12/09/2012 05:32 PM 2.99 (H) 0.50 - 1.35 mg/dL Final  12/29/2011 09:52 AM 2.1 (H) 0.4 - 1.5 mg/dL Final  12/25/2011 12:59 PM 2.8 (H) 0.4 - 1.5 mg/dL Final  12/15/2011 11:37 AM 3.2 (H) 0.4 - 1.5 mg/dL Final  09/19/2010 10:45 AM 2.0 (H) 0.4 - 1.5 mg/dL Final  06/13/2008 02:10 PM 1.7 (H) 0.4 - 1.5 mg/dL Final   Recent Labs  Lab 01/29/23 0154 01/30/23 0452 02/03/23 1501 02/03/23 1936  NA 139 140 142 141  K 4.5 4.4 4.7 5.0  CL 110 111 109 110  CO2 20* 19* 20 22  GLUCOSE 100* 103* 86 113*  BUN 92* 80* 117* 116*  CREATININE 3.54* 3.04* 4.02* 4.31*  CALCIUM 8.4* 8.4* 8.8 8.7*   Recent Labs  Lab 02/03/23 1501 02/03/23 1936 02/04/23 0954  WBC 5.1 5.2 4.6  NEUTROABS  --  3.4  --   HGB 12.0* 11.8* 11.7*  HCT 36.7* 37.5* 35.5*  MCV 88.1 90.1 89.0  PLT 175.0 188 168   Liver Function Tests: No results for input(s): "AST", "ALT", "ALKPHOS", "BILITOT", "PROT", "ALBUMIN" in the last 168 hours. No results for input(s): "LIPASE", "AMYLASE" in the last 168 hours. No results for input(s): "AMMONIA" in the last 168 hours. Cardiac Enzymes: No results for input(s): "CKTOTAL", "CKMB", "CKMBINDEX", "TROPONINI" in the last 168 hours. Iron Studies: No results for input(s): "IRON", "TIBC", "TRANSFERRIN", "FERRITIN" in the last 72 hours. PT/INR: '@LABRCNTIP'$ (inr:5)  Xrays/Other Studies: ) Results for orders placed or performed during the hospital encounter of 02/03/23 (from the past 48 hour(s))  Basic metabolic panel     Status: Abnormal   Collection Time: 02/03/23  7:36 PM  Result Value Ref Range   Sodium 141 135 - 145 mmol/L   Potassium 5.0 3.5 - 5.1 mmol/L   Chloride 110 98 - 111 mmol/L   CO2 22 22 - 32 mmol/L   Glucose, Bld 113 (H) 70 - 99 mg/dL    Comment: Glucose reference range applies only to samples taken after fasting for at least 8 hours.   BUN 116 (H) 8 - 23 mg/dL   Creatinine, Ser 4.31 (H) 0.61 - 1.24 mg/dL   Calcium 8.7 (L)  8.9 - 10.3 mg/dL   GFR, Estimated 13 (L) >60 mL/min    Comment: (NOTE) Calculated using the CKD-EPI Creatinine Equation (2021)    Anion gap 9 5 - 15    Comment: Performed at [facility] [address] [place], [place] [address]  CBC with Differential     Status: Abnormal   Collection Time: 02/03/23  7:36 PM  Result Value Ref Range   WBC 5.2 4.0 - 10.5 K/uL   RBC 4.16 (L) 4.22 - 5.81 MIL/uL   Hemoglobin 11.8 (L) 13.0 - 17.0 g/dL   HCT 37.5 (  L) 39.0 - 52.0 %   MCV 90.1 80.0 - 100.0 fL   MCH 28.4 26.0 - 34.0 pg   MCHC 31.5 30.0 - 36.0 g/dL   RDW 16.5 (H) 11.5 - 15.5 %   Platelets 188 150 - 400 K/uL   nRBC 0.0 0.0 - 0.2 %   Neutrophils Relative % 66 %   Neutro Abs 3.4 1.7 - 7.7 K/uL   Lymphocytes Relative 16 %   Lymphs Abs 0.9 0.7 - 4.0 K/uL   Monocytes Relative 17 %   Monocytes Absolute 0.9 0.1 - 1.0 K/uL   Eosinophils Relative 1 %   Eosinophils Absolute 0.1 0.0 - 0.5 K/uL   Basophils Relative 0 %   Basophils Absolute 0.0 0.0 - 0.1 K/uL   Immature Granulocytes 0 %   Abs Immature Granulocytes 0.02 0.00 - 0.07 K/uL    Comment: Performed at [facility] [address], [place], [place] [address]  Brain natriuretic peptide     Status: Abnormal   Collection Time: 02/03/23  7:36 PM  Result Value Ref Range   B Natriuretic Peptide 878.0 (H) 0.0 - 100.0 pg/mL    Comment: Performed at [facility] [address], [place], [place] [address]  Urinalysis, Routine w reflex microscopic -Urine, Clean Catch     Status: Abnormal   Collection Time: 02/04/23  3:18 AM  Result Value Ref Range   Color, Urine YELLOW YELLOW   APPearance CLEAR CLEAR   Specific Gravity, Urine 1.012 1.005 - 1.030   pH 5.0 5.0 - 8.0   Glucose, UA NEGATIVE NEGATIVE mg/dL   Hgb urine dipstick NEGATIVE NEGATIVE   Bilirubin Urine NEGATIVE NEGATIVE   Ketones, ur NEGATIVE NEGATIVE mg/dL   Protein, ur 30 (A) NEGATIVE mg/dL   Nitrite NEGATIVE NEGATIVE   Leukocytes,Ua NEGATIVE NEGATIVE   RBC / HPF  0-5 0 - 5 RBC/hpf   WBC, UA 0-5 0 - 5 WBC/hpf   Bacteria, UA NONE SEEN NONE SEEN   Squamous Epithelial / HPF 0-5 0 - 5 /HPF   Mucus PRESENT     Comment: Performed at [facility], [place] [address], [place], [place] [address]  CBC     Status: Abnormal   Collection Time: 02/04/23  9:54 AM  Result Value Ref Range   WBC 4.6 4.0 - 10.5 K/uL   RBC 3.99 (L) 4.22 - 5.81 MIL/uL   Hemoglobin 11.7 (L) 13.0 - 17.0 g/dL   HCT 35.5 (L) 39.0 - 52.0 %   MCV 89.0 80.0 - 100.0 fL   MCH 29.3 26.0 - 34.0 pg   MCHC 33.0 30.0 - 36.0 g/dL   RDW 16.4 (H) 11.5 - 15.5 %   Platelets 168 150 - 400 K/uL   nRBC 0.0 0.0 - 0.2 %    Comment: Performed at [facility], [place] [address], [place], [place] [address]   DG Chest 2 View  Result Date: 02/04/2023 CLINICAL DATA:  Bilateral basilar rales EXAM: CHEST - 2 VIEW COMPARISON:  01/27/2023 FINDINGS: Cardiac shadow is enlarged. Aortic calcifications are noted. Lungs are well aerated bilaterally. No focal infiltrate is seen. Small right-sided pleural effusion is noted. No bony abnormality is seen. IMPRESSION: Right-sided pleural effusion.  No other focal abnormality is noted. Electronically Signed   By: Inez Catalina M.D.   On: 02/04/2023 01:00   ECHOCARDIOGRAM COMPLETE  Result Date: 02/02/2023    ECHOCARDIOGRAM REPORT   Patient Name:   TINY OBRYON Date of  Exam: 02/02/2023 Medical Rec #:  [id number]        Height:       68.0 in Accession #:    [id number]       Weight:       136.6 lb Date of Birth:  [birth year]       BSA:          1.738 m Patient Age:    73 years         BP:           126/68 mmHg Patient Gender: M                HR:           116 bpm. Exam Location:  [facility] Procedure: 2D Echo, Cardiac Doppler, Color Doppler and 3D Echo Indications:    I50.42 Chronic combined systolic (congestive) and diastolic                 (congestive) heart failure  History:        Patient has prior history of Echocardiogram examinations, most                 recent  10/29/2022. Arrythmias:Atrial Fibrillation, Atrial                 Flutter and LBBB; Risk Factors:Hypertension and Dyslipidemia.  Sonographer:    Diamond Nickel RCS Referring Phys: [place]  1. Left ventricular ejection fraction, by estimation, is 20 to 25%. The left ventricle has severely decreased function. The left ventricle demonstrates global hypokinesis. There is moderate left ventricular hypertrophy. Left ventricular diastolic parameters are indeterminate.  2. Right ventricular systolic function is severely reduced. The right ventricular size is normal. There is moderately elevated pulmonary artery systolic pressure. The estimated right ventricular systolic pressure is 0000000 mmHg.  3. Left atrial size was mildly dilated.  4. Right atrial size was mildly dilated.  5. A small pericardial effusion is present.  6. The mitral valve is abnormal. Moderate mitral valve regurgitation. Appears functional. No evidence of mitral stenosis.  7. Tricuspid valve regurgitation is moderate to severe.  8. The aortic valve is calcified. There is moderate calcification of the aortic valve. Aortic valve regurgitation is trivial. Aortic valve sclerosis/calcification is present, without any evidence of aortic stenosis.  9. The inferior vena cava is dilated in size with <50% respiratory variability, suggesting right atrial pressure of 15 mmHg. FINDINGS  Left Ventricle: Left ventricular ejection fraction, by estimation, is 20 to 25%. The left ventricle has severely decreased function. The left ventricle demonstrates global hypokinesis. The left ventricular internal cavity size was normal in size. There is moderate left ventricular hypertrophy. Left ventricular diastolic parameters are indeterminate. Right Ventricle: The right ventricular size is normal. No increase in right ventricular wall thickness. Right ventricular systolic function is severely reduced. There is moderately elevated pulmonary artery systolic  pressure. The tricuspid regurgitant velocity is 2.97 m/s, and with an assumed right atrial pressure of 15 mmHg, the estimated right ventricular systolic pressure is 0000000 mmHg. Left Atrium: Left atrial size was mildly dilated. Right Atrium: Right atrial size was mildly dilated. Pericardium: A small pericardial effusion is present. Mitral Valve: The mitral valve is abnormal. Moderate mitral valve regurgitation. No evidence of mitral valve stenosis. Tricuspid Valve: The tricuspid valve is normal in structure. Tricuspid valve regurgitation is moderate to severe. Aortic Valve: The aortic valve is calcified. There is moderate calcification of the aortic valve. Aortic valve regurgitation  is trivial. Aortic valve sclerosis/calcification is present, without any evidence of aortic stenosis. Pulmonic Valve: The pulmonic valve was not well visualized. Pulmonic valve regurgitation is mild. Aorta: The aortic root and ascending aorta are structurally normal, with no evidence of dilitation. Venous: The inferior vena cava is dilated in size with less than 50% respiratory variability, suggesting right atrial pressure of 15 mmHg. IAS/Shunts: The interatrial septum was not well visualized.  LEFT VENTRICLE PLAX 2D LVIDd:         4.30 cm   Diastology LVIDs:         3.60 cm   LV e' medial:    4.31 cm/s LV PW:         1.60 cm   LV E/e' medial:  23.1 LV IVS:        1.60 cm   LV e' lateral:   8.10 cm/s LVOT diam:     2.00 cm   LV E/e' lateral: 12.3 LV SV:         24 LV SV Index:   14 LVOT Area:     3.14 cm                           3D Volume EF:                          3D EF:        20 %                          LV EDV:       172 ml                          LV ESV:       138 ml                          LV SV:        34 ml RIGHT VENTRICLE RV Basal diam:  3.20 cm RV S prime:     5.68 cm/s TAPSE (M-mode): 0.7 cm RVSP:           50.3 mmHg LEFT ATRIUM           Index        RIGHT ATRIUM            Index LA diam:      4.90 cm 2.82 cm/m   RA  Pressure: 15.00 mmHg LA Vol (A2C): 77.1 ml 44.36 ml/m  RA Area:     19.60 cm LA Vol (A4C): 45.4 ml 26.12 ml/m  RA Volume:   59.50 ml   34.23 ml/m  AORTIC VALVE LVOT Vmax:   72.97 cm/s LVOT Vmean:  43.800 cm/s LVOT VTI:    0.078 m  AORTA Ao Root diam: 3.70 cm Ao Asc diam:  3.50 cm MITRAL VALVE                  TRICUSPID VALVE MV Area (PHT): 6.67 cm       TR Peak grad:   35.3 mmHg MV Decel Time: 114 msec       TR Vmax:        297.00 cm/s MR Peak grad:    83.4 mmHg    Estimated RAP:  15.00 mmHg MR Mean grad:    45.7 mmHg  RVSP:           50.3 mmHg MR Vmax:         456.67 cm/s MR Vmean:        301.3 cm/s   SHUNTS MR PISA:         3.08 cm     Systemic VTI:  0.08 m MR PISA Eff ROA: 26 mm       Systemic Diam: 2.00 cm MR PISA Radius:  0.70 cm MV E velocity: 99.60 cm/s MV A velocity: 35.37 cm/s MV E/A ratio:  2.82 Oswaldo Milian MD Electronically signed by Oswaldo Milian MD Signature Date/Time: 02/02/2023/7:15:36 PM    Final     PMH:   Past Medical History:  Diagnosis Date   Anemia    Atrial fibrillation (HCC)    BPH (benign prostatic hypertrophy)    Chronic kidney disease    Diverticulosis    Hyperlipidemia    Hypertension     PSH:   Past Surgical History:  Procedure Laterality Date   APPENDECTOMY     CARDIOVERSION N/A 10/29/2022   Procedure: CARDIOVERSION;  Surgeon: Lelon Perla, MD;  Location: [facility];  Service: Cardiovascular;  Laterality: N/A;   HERNIA REPAIR     TEE WITHOUT CARDIOVERSION N/A 10/29/2022   Procedure: TRANSESOPHAGEAL ECHOCARDIOGRAM (TEE);  Surgeon: Lelon Perla, MD;  Location: [facility] ENDOSCOPY;  Service: Cardiovascular;  Laterality: N/A;    Allergies: No Known Allergies  Medications:   Prior to Admission medications   Medication Sig Start Date End Date Taking? Authorizing Provider  apixaban (ELIQUIS) 2.5 MG TABS tablet Take 1 tablet (2.5 mg total) by mouth 2 (two) times daily. 11/21/22  Yes Nahser, Wonda Cheng, MD  COD LIVER OIL PO Take 1 tablet by  mouth daily.   Yes [provider]  furosemide (LASIX) 40 MG tablet Take 1 tablet (40 mg total) by mouth daily as needed for edema or fluid (as needed in case of weight gain 2 to 3 lbs in 24 hrs or 5 lbs in 7 days, leg swelling or shortness of breath.). 11/21/22  Yes Nahser, Wonda Cheng, MD  GARLIC PO Take 1 tablet by mouth daily.   Yes [provider]  hydrALAZINE (APRESOLINE) 10 MG tablet Take 1 tablet (10 mg total) by mouth 3 (three) times daily. 11/21/22  Yes Nahser, Wonda Cheng, MD  metoprolol succinate (TOPROL XL) 100 MG 24 hr tablet Take 1 tablet (100 mg total) by mouth daily. 01/31/23  Yes Annita Brod, MD  Multiple Vitamin (MULTIVITAMIN) capsule Take 1 capsule by mouth daily.   Yes [provider]  Saw Palmetto 450 MG CAPS Take 900 mg by mouth daily.   Yes [provider]  isosorbide mononitrate (IMDUR) 30 MG 24 hr tablet Take 1 tablet (30 mg total) by mouth daily. 11/21/22   Nahser, Wonda Cheng, MD  ondansetron (ZOFRAN) 4 MG tablet Take 0.5-1 tablets (2-4 mg total) by mouth every 12 (twelve) hours as needed for nausea or vomiting. Patient not taking: Reported on 02/04/2023 02/03/23   [place], [name], MD    Discontinued Meds:   Medications Discontinued During This Encounter  Medication Reason   multivitamin capsule 1 capsule Not available    Social History:  reports that he has never smoked. He has never used smokeless tobacco. He reports that he does not drink alcohol and does not use drugs.  Family History:   Family History  Problem Relation Age of Onset   Heart disease Other  Blood pressure (!) 119/94, pulse 95, temperature (!) 97.4 F (36.3 C), temperature source Oral, resp. rate 20, height '5\' 8"'$  (1.727 m), weight 68.2 kg, SpO2 100 %. General appearance: alert, cooperative, appears stated age, and no distress Resp: Minimal crackles in lower lung fields Cardio: irregularly irregular rhythm, S1, S2 normal, no S3 or S4, no click, no rub, and no  murmur GI: soft, non-tender; bowel sounds normal; no masses,  no organomegaly Extremities: edema 2+ pitting edema to level of mid shin       Holley Bouche, MD 02/04/2023, 11:50 AM

## 2023-02-04 NOTE — Telephone Encounter (Signed)
Pt wife thelma is calling and does not have a clear understanding as to why her husband is at ER. I do not see DPR on file

## 2023-02-04 NOTE — Assessment & Plan Note (Signed)
Continue metoprolol succinate 100 mg daily and Eliquis 2.5 mg twice daily. We discussed some side effects of medications. Following with cardiologist.

## 2023-02-04 NOTE — ED Provider Notes (Signed)
[facility] Provider Note   CSN: [id number] Arrival date & time: 02/03/23  1914     History  Chief Complaint  Patient presents with   abnormal labs    Douglas Matthews is a 88 y.o. male.  HPI   Patient with medical history including CKD stage IV, A-fib currently on Eliquis, diastolic heart failure with an EF of 30%, presenting with an abnormal lab work.  Patient states that he was seen by his primary care doctor and told him that his creatinine levels were very high.  He states that he is having no complaints at this time.  He states being discharged from the hospital he has not had a great his appetite but is still tolerating p.o., states he is not drinks a little bit, he has had no associated nausea vomiting diarrhea, he denies any bloody stools, states he is still urinary without difficulty.  He has no other complaints at this time.  I reviewed patient's chart recently discharged from the hospital on February 27, had an AKI of 4 baseline appears to be around 2.7, this was in the setting of nausea vomiting, likely he was dehydrated, with gentle fluid resuscitation creatinine went back down to baseline.  Home Medications Prior to Admission medications   Medication Sig Start Date End Date Taking? Authorizing Provider  apixaban (ELIQUIS) 2.5 MG TABS tablet Take 1 tablet (2.5 mg total) by mouth 2 (two) times daily. 11/21/22  Yes Nahser, Wonda Cheng, MD  COD LIVER OIL PO Take 1 tablet by mouth daily.   Yes [provider]  furosemide (LASIX) 40 MG tablet Take 1 tablet (40 mg total) by mouth daily as needed for edema or fluid (as needed in case of weight gain 2 to 3 lbs in 24 hrs or 5 lbs in 7 days, leg swelling or shortness of breath.). 11/21/22  Yes Nahser, Wonda Cheng, MD  GARLIC PO Take 1 tablet by mouth daily.   Yes [provider]  hydrALAZINE (APRESOLINE) 10 MG tablet Take 1 tablet (10 mg total) by mouth 3 (three) times daily.  11/21/22  Yes Nahser, Wonda Cheng, MD  metoprolol succinate (TOPROL XL) 100 MG 24 hr tablet Take 1 tablet (100 mg total) by mouth daily. 01/31/23  Yes Annita Brod, MD  Multiple Vitamin (MULTIVITAMIN) capsule Take 1 capsule by mouth daily.   Yes [provider]  Saw Palmetto 450 MG CAPS Take 900 mg by mouth daily.   Yes [provider]  isosorbide mononitrate (IMDUR) 30 MG 24 hr tablet Take 1 tablet (30 mg total) by mouth daily. 11/21/22   Nahser, Wonda Cheng, MD  ondansetron (ZOFRAN) 4 MG tablet Take 0.5-1 tablets (2-4 mg total) by mouth every 12 (twelve) hours as needed for nausea or vomiting. Patient not taking: Reported on 02/04/2023 02/03/23   Martinique, [name], MD      Allergies    Patient has no known allergies.    Review of Systems   Review of Systems  Constitutional:  Negative for chills and fever.  Respiratory:  Negative for shortness of breath.   Cardiovascular:  Negative for chest pain.  Gastrointestinal:  Negative for abdominal pain.  Neurological:  Negative for headaches.    Physical Exam Updated Vital Signs BP (!) 126/102   Pulse 86   Temp 97.9 F (36.6 C)   Resp (!) 23   Ht '5\' 8"'$  (1.727 m)   Wt 68.2 kg   SpO2 100%  BMI 22.86 kg/m  Physical Exam Vitals and nursing note reviewed.  Constitutional:      General: He is not in acute distress.    Appearance: He is not ill-appearing.  HENT:     Head: Normocephalic and atraumatic.     Nose: No congestion.  Eyes:     Conjunctiva/sclera: Conjunctivae normal.  Cardiovascular:     Rate and Rhythm: Normal rate and regular rhythm.     Pulses: Normal pulses.     Heart sounds: No murmur heard.    No friction rub. No gallop.  Pulmonary:     Effort: No respiratory distress.     Breath sounds: No wheezing, rhonchi or rales.     Comments: Minimal crackles heard in the lower lobes bilaterally worse on the right versus the left. Musculoskeletal:     Right lower leg: Edema present.     Left lower leg: Edema  present.     Comments: Patient is a 1+ edema in the lower legs bilaterally.  Skin:    General: Skin is warm and dry.  Neurological:     Mental Status: He is alert.  Psychiatric:        Mood and Affect: Mood normal.     ED Results / Procedures / Treatments   Labs (all labs ordered are listed, but only abnormal results are displayed) Labs Reviewed  BASIC METABOLIC PANEL - Abnormal; Notable for the following components:      Result Value   Glucose, Bld 113 (*)    BUN 116 (*)    Creatinine, Ser 4.31 (*)    Calcium 8.7 (*)    GFR, Estimated 13 (*)    All other components within normal limits  CBC WITH DIFFERENTIAL/PLATELET - Abnormal; Notable for the following components:   RBC 4.16 (*)    Hemoglobin 11.8 (*)    HCT 37.5 (*)    RDW 16.5 (*)    All other components within normal limits  URINALYSIS, ROUTINE W REFLEX MICROSCOPIC - Abnormal; Notable for the following components:   Protein, ur 30 (*)    All other components within normal limits  BRAIN NATRIURETIC PEPTIDE - Abnormal; Notable for the following components:   B Natriuretic Peptide 878.0 (*)    All other components within normal limits    EKG None  Radiology DG Chest 2 View  Result Date: 02/04/2023 CLINICAL DATA:  Bilateral basilar rales EXAM: CHEST - 2 VIEW COMPARISON:  01/27/2023 FINDINGS: Cardiac shadow is enlarged. Aortic calcifications are noted. Lungs are well aerated bilaterally. No focal infiltrate is seen. Small right-sided pleural effusion is noted. No bony abnormality is seen. IMPRESSION: Right-sided pleural effusion.  No other focal abnormality is noted. Electronically Signed   By: Inez Catalina M.D.   On: 02/04/2023 01:00   ECHOCARDIOGRAM COMPLETE  Result Date: 02/02/2023    ECHOCARDIOGRAM REPORT   Patient Name:   Douglas Matthews Date of Exam: 02/02/2023 Medical Rec #:  [id number]        Height:       68.0 in Accession #:    [id number]       Weight:       136.6 lb Date of Birth:  [birth year]       BSA:           1.738 m Patient Age:    62 years         BP:           126/68 mmHg Patient Gender: M  HR:           116 bpm. Exam Location:  [facility] Procedure: 2D Echo, Cardiac Doppler, Color Doppler and 3D Echo Indications:    I50.42 Chronic combined systolic (congestive) and diastolic                 (congestive) heart failure  History:        Patient has prior history of Echocardiogram examinations, most                 recent 10/29/2022. Arrythmias:Atrial Fibrillation, Atrial                 Flutter and LBBB; Risk Factors:Hypertension and Dyslipidemia.  Sonographer:    Diamond Nickel RCS Referring Phys: [place]  1. Left ventricular ejection fraction, by estimation, is 20 to 25%. The left ventricle has severely decreased function. The left ventricle demonstrates global hypokinesis. There is moderate left ventricular hypertrophy. Left ventricular diastolic parameters are indeterminate.  2. Right ventricular systolic function is severely reduced. The right ventricular size is normal. There is moderately elevated pulmonary artery systolic pressure. The estimated right ventricular systolic pressure is 0000000 mmHg.  3. Left atrial size was mildly dilated.  4. Right atrial size was mildly dilated.  5. A small pericardial effusion is present.  6. The mitral valve is abnormal. Moderate mitral valve regurgitation. Appears functional. No evidence of mitral stenosis.  7. Tricuspid valve regurgitation is moderate to severe.  8. The aortic valve is calcified. There is moderate calcification of the aortic valve. Aortic valve regurgitation is trivial. Aortic valve sclerosis/calcification is present, without any evidence of aortic stenosis.  9. The inferior vena cava is dilated in size with <50% respiratory variability, suggesting right atrial pressure of 15 mmHg. FINDINGS  Left Ventricle: Left ventricular ejection fraction, by estimation, is 20 to 25%. The left ventricle has severely decreased  function. The left ventricle demonstrates global hypokinesis. The left ventricular internal cavity size was normal in size. There is moderate left ventricular hypertrophy. Left ventricular diastolic parameters are indeterminate. Right Ventricle: The right ventricular size is normal. No increase in right ventricular wall thickness. Right ventricular systolic function is severely reduced. There is moderately elevated pulmonary artery systolic pressure. The tricuspid regurgitant velocity is 2.97 m/s, and with an assumed right atrial pressure of 15 mmHg, the estimated right ventricular systolic pressure is 0000000 mmHg. Left Atrium: Left atrial size was mildly dilated. Right Atrium: Right atrial size was mildly dilated. Pericardium: A small pericardial effusion is present. Mitral Valve: The mitral valve is abnormal. Moderate mitral valve regurgitation. No evidence of mitral valve stenosis. Tricuspid Valve: The tricuspid valve is normal in structure. Tricuspid valve regurgitation is moderate to severe. Aortic Valve: The aortic valve is calcified. There is moderate calcification of the aortic valve. Aortic valve regurgitation is trivial. Aortic valve sclerosis/calcification is present, without any evidence of aortic stenosis. Pulmonic Valve: The pulmonic valve was not well visualized. Pulmonic valve regurgitation is mild. Aorta: The aortic root and ascending aorta are structurally normal, with no evidence of dilitation. Venous: The inferior vena cava is dilated in size with less than 50% respiratory variability, suggesting right atrial pressure of 15 mmHg. IAS/Shunts: The interatrial septum was not well visualized.  LEFT VENTRICLE PLAX 2D LVIDd:         4.30 cm   Diastology LVIDs:         3.60 cm   LV e' medial:    4.31 cm/s LV PW:  1.60 cm   LV E/e' medial:  23.1 LV IVS:        1.60 cm   LV e' lateral:   8.10 cm/s LVOT diam:     2.00 cm   LV E/e' lateral: 12.3 LV SV:         24 LV SV Index:   14 LVOT Area:     3.14  cm                           3D Volume EF:                          3D EF:        20 %                          LV EDV:       172 ml                          LV ESV:       138 ml                          LV SV:        34 ml RIGHT VENTRICLE RV Basal diam:  3.20 cm RV S prime:     5.68 cm/s TAPSE (M-mode): 0.7 cm RVSP:           50.3 mmHg LEFT ATRIUM           Index        RIGHT ATRIUM            Index LA diam:      4.90 cm 2.82 cm/m   RA Pressure: 15.00 mmHg LA Vol (A2C): 77.1 ml 44.36 ml/m  RA Area:     19.60 cm LA Vol (A4C): 45.4 ml 26.12 ml/m  RA Volume:   59.50 ml   34.23 ml/m  AORTIC VALVE LVOT Vmax:   72.97 cm/s LVOT Vmean:  43.800 cm/s LVOT VTI:    0.078 m  AORTA Ao Root diam: 3.70 cm Ao Asc diam:  3.50 cm MITRAL VALVE                  TRICUSPID VALVE MV Area (PHT): 6.67 cm       TR Peak grad:   35.3 mmHg MV Decel Time: 114 msec       TR Vmax:        297.00 cm/s MR Peak grad:    83.4 mmHg    Estimated RAP:  15.00 mmHg MR Mean grad:    45.7 mmHg    RVSP:           50.3 mmHg MR Vmax:         456.67 cm/s MR Vmean:        301.3 cm/s   SHUNTS MR PISA:         3.08 cm     Systemic VTI:  0.08 m MR PISA Eff ROA: 26 mm       Systemic Diam: 2.00 cm MR PISA Radius:  0.70 cm MV E velocity: 99.60 cm/s MV A velocity: 35.37 cm/s MV E/A ratio:  2.82 Oswaldo Milian MD Electronically signed by Oswaldo Milian MD Signature Date/Time: 02/02/2023/7:15:36 PM    Final     Procedures  Procedures    Medications Ordered in ED Medications  furosemide (LASIX) injection 60 mg (60 mg Intravenous Given 02/04/23 0217)    ED Course/ Medical Decision Making/ A&P                             Medical Decision Making Amount and/or Complexity of Data Reviewed Labs: ordered. Radiology: ordered.  Risk Prescription drug management. Decision regarding hospitalization.   This patient presents to the ED for concern of abnormal lab work, this involves an extensive number of treatment options, and is a complaint that  carries with it a high risk of complications and morbidity.  The differential diagnosis includes metabolic derailment, anemia, renal obstruction    Additional history obtained:  Additional history obtained from wife at bedside External records from outside source obtained and reviewed including recent hospitalization   Co morbidities that complicate the patient evaluation  CKD, anticoagulated due to A-fib  Social Determinants of Health:  N/A    Lab Tests:  I Ordered, and personally interpreted labs.  The pertinent results include: CBC shows normocytic anemia hemoglobin 11.8, BMP shows glucose of 113, BUN of 116, creatinine 4.3, GFR 13, BNP is 878,    Imaging Studies ordered:  I ordered imaging studies including chest x-ray I independently visualized and interpreted imaging which showed reveals right sided pleural effusion I agree with the radiologist interpretation   Cardiac Monitoring:  The patient was maintained on a cardiac monitor.  I personally viewed and interpreted the cardiac monitored which showed an underlying rhythm of: A-fib without signs of ischemia   Medicines ordered and prescription drug management:  I ordered medication including Lasix I have reviewed the patients home medicines and have made adjustments as needed  Critical Interventions:  N/A   Reevaluation:  Presents with worsening creatinine function, triage obtain basic lab work which I personally reviewed, creatinine is elevated 4.3 baseline appears to be 2.7, he has an elevated BUN of 113, he is not encephalopathic, I do note some peripheral edema with some crackles present during my examination, I am concerned for possible volume overload, will add on chest x-ray, UA, and BNP for further assessment.  Chest x-ray as well as BNP shows increased edema, will consult with nephrology for further recommendations  I updated patient on recommendations from nephrology during this plan will consult  with medicine for admission.   Consultations Obtained:  I requested consultation with the Dr. Royce Macadamia,  and discussed lab and imaging findings as well as pertinent plan - they recommend: This admission, 1 dose of Lasix will see patient in the morning. Spoke with Dr. Marlowe Sax who will admit the patient.    Test Considered:  N/A    Rule out Suspicion for UTI [place] or kidney stone is low at this time not endorsing any urinary symptoms, he had no suprapubic pain no flank tenderness no CVA tenderness.  Suspicion for emergent hemodialysis is low at this time as he has no new oxygen requirements, no significant electrolyte derailment.  Suspicion for ACS is also low denies any chest pain or shortness of breath EKG without signs of ischemia.    Dispostion and problem list  After consideration of the diagnostic results and the patients response to treatment, I feel that the patent would benefit from admission.  AKI-suspect this is multifactorial, possible from decrease in cardiac function resulting in prerenal insult to kidneys.  Patient is currently getting Lasix at this time, will need  formal consultation by nephrology for further management.            Final Clinical Impression(s) / ED Diagnoses Final diagnoses:  AKI (acute kidney injury) ([place])  Uremia    Rx / DC Orders ED Discharge Orders     None         Marcello Fennel, PA-C 02/04/23 0439    Quintella Reichert, MD 02/04/23 [phone]

## 2023-02-04 NOTE — ED Notes (Signed)
ED TO INPATIENT HANDOFF REPORT  ED Nurse Name and Phone #:   Douglas Matthews [phone]  S Name/Age/Gender Douglas Matthews 88 y.o. male Room/Bed: 042C/042C  Code Status   Code Status: Full Code  Home/SNF/Other Home Patient oriented to: self, place, time, and situation Is this baseline? Yes   Triage Complete: Triage complete  Chief Complaint AKI (acute kidney injury) ([place]) [N17.9]  Triage Note Pt states PCP sent him due to abnormal kidney function blood work results. Pt is not sure what the results were. Pt denies any complaints at this time.    Allergies No Known Allergies  Level of Care/Admitting Diagnosis ED Disposition     ED Disposition  Admit   Condition  --   Comment  Hospital Area: [facility] [[id number]]  Level of Care: Telemetry Medical [104]  May admit patient to [facility] or [facility] if equivalent level of care is available:: Yes  Covid Evaluation: Asymptomatic - no recent exposure (last 10 days) testing not required  Diagnosis: AKI (acute kidney injury) [facility]) [id number]  Admitting Physician: Dory Horn [id number]  Attending Physician: Dory Horn [id number]  Certification:: I certify this patient will need inpatient services for at least 2 midnights  Estimated Length of Stay: 2          B Medical/Surgery History Past Medical History:  Diagnosis Date   Anemia    Atrial fibrillation ([place])    BPH (benign prostatic hypertrophy)    Chronic kidney disease    Diverticulosis    Hyperlipidemia    Hypertension    Past Surgical History:  Procedure Laterality Date   APPENDECTOMY     CARDIOVERSION N/A 10/29/2022   Procedure: CARDIOVERSION;  Surgeon: Lelon Perla, MD;  Location: [facility];  Service: Cardiovascular;  Laterality: N/A;   HERNIA REPAIR     TEE WITHOUT CARDIOVERSION N/A 10/29/2022   Procedure: TRANSESOPHAGEAL ECHOCARDIOGRAM (TEE);  Surgeon: Lelon Perla, MD;  Location: [facility] ENDOSCOPY;  Service:  Cardiovascular;  Laterality: N/A;     A IV Location/Drains/Wounds Patient Lines/Drains/Airways Status     Active Line/Drains/Airways     Name Placement date Placement time Site Days   Peripheral IV 02/04/23 20 G Anterior;Right Forearm 02/04/23  0106  Forearm  less than 1            Intake/Output Last 24 hours  Intake/Output Summary (Last 24 hours) at 02/04/2023 1450 Last data filed at 02/04/2023 0500 Gross per 24 hour  Intake 60 ml  Output 975 ml  Net -915 ml    Labs/Imaging Results for orders placed or performed during the hospital encounter of 02/03/23 (from the past 48 hour(s))  Basic metabolic panel     Status: Abnormal   Collection Time: 02/03/23  7:36 PM  Result Value Ref Range   Sodium 141 135 - 145 mmol/L   Potassium 5.0 3.5 - 5.1 mmol/L   Chloride 110 98 - 111 mmol/L   CO2 22 22 - 32 mmol/L   Glucose, Bld 113 (H) 70 - 99 mg/dL    Comment: Glucose reference range applies only to samples taken after fasting for at least 8 hours.   BUN 116 (H) 8 - 23 mg/dL   Creatinine, Ser 4.31 (H) 0.61 - 1.24 mg/dL   Calcium 8.7 (L) 8.9 - 10.3 mg/dL   GFR, Estimated 13 (L) >60 mL/min    Comment: (NOTE) Calculated using the CKD-EPI Creatinine Equation (2021)    Anion gap 9 5 - 15    Comment:  Performed at [facility], [place] [address] [place], [place] [address]  CBC with Differential     Status: Abnormal   Collection Time: 02/03/23  7:36 PM  Result Value Ref Range   WBC 5.2 4.0 - 10.5 K/uL   RBC 4.16 (L) 4.22 - 5.81 MIL/uL   Hemoglobin 11.8 (L) 13.0 - 17.0 g/dL   HCT 37.5 (L) 39.0 - 52.0 %   MCV 90.1 80.0 - 100.0 fL   MCH 28.4 26.0 - 34.0 pg   MCHC 31.5 30.0 - 36.0 g/dL   RDW 16.5 (H) 11.5 - 15.5 %   Platelets 188 150 - 400 K/uL   nRBC 0.0 0.0 - 0.2 %   Neutrophils Relative % 66 %   Neutro Abs 3.4 1.7 - 7.7 K/uL   Lymphocytes Relative 16 %   Lymphs Abs 0.9 0.7 - 4.0 K/uL   Monocytes Relative 17 %   Monocytes Absolute 0.9 0.1 - 1.0 K/uL   Eosinophils Relative  1 %   Eosinophils Absolute 0.1 0.0 - 0.5 K/uL   Basophils Relative 0 %   Basophils Absolute 0.0 0.0 - 0.1 K/uL   Immature Granulocytes 0 %   Abs Immature Granulocytes 0.02 0.00 - 0.07 K/uL    Comment: Performed at [facility], [address] [place], [place] [address]  Brain natriuretic peptide     Status: Abnormal   Collection Time: 02/03/23  7:36 PM  Result Value Ref Range   B Natriuretic Peptide 878.0 (H) 0.0 - 100.0 pg/mL    Comment: Performed at [facility] [address] [place], [place] [address]  Urinalysis, Routine w reflex microscopic -Urine, Clean Catch     Status: Abnormal   Collection Time: 02/04/23  3:18 AM  Result Value Ref Range   Color, Urine YELLOW YELLOW   APPearance CLEAR CLEAR   Specific Gravity, Urine 1.012 1.005 - 1.030   pH 5.0 5.0 - 8.0   Glucose, UA NEGATIVE NEGATIVE mg/dL   Hgb urine dipstick NEGATIVE NEGATIVE   Bilirubin Urine NEGATIVE NEGATIVE   Ketones, ur NEGATIVE NEGATIVE mg/dL   Protein, ur 30 (A) NEGATIVE mg/dL   Nitrite NEGATIVE NEGATIVE   Leukocytes,Ua NEGATIVE NEGATIVE   RBC / HPF 0-5 0 - 5 RBC/hpf   WBC, UA 0-5 0 - 5 WBC/hpf   Bacteria, UA NONE SEEN NONE SEEN   Squamous Epithelial / HPF 0-5 0 - 5 /HPF   Mucus PRESENT     Comment: Performed at [facility], [place] [address] [place],  [address]  Comprehensive metabolic panel     Status: Abnormal   Collection Time: 02/04/23  9:54 AM  Result Value Ref Range   Sodium 141 135 - 145 mmol/L   Potassium 4.7 3.5 - 5.1 mmol/L   Chloride 111 98 - 111 mmol/L   CO2 16 (L) 22 - 32 mmol/L   Glucose, Bld 87 70 - 99 mg/dL    Comment: Glucose reference range applies only to samples taken after fasting for at least 8 hours.   BUN 124 (H) 8 - 23 mg/dL   Creatinine, Ser 4.00 (H) 0.61 - 1.24 mg/dL   Calcium 8.5 (L) 8.9 - 10.3 mg/dL   Total Protein 6.1 (L) 6.5 - 8.1 g/dL   Albumin 3.0 (L) 3.5 - 5.0 g/dL   AST 48 (H) 15 - 41 U/L   ALT 71 (H) 0 - 44 U/L   Alkaline Phosphatase  136 (H) 38 - 126  U/L   Total Bilirubin 0.9 0.3 - 1.2 mg/dL   GFR, Estimated 14 (L) >60 mL/min    Comment: (NOTE) Calculated using the CKD-EPI Creatinine Equation (2021)    Anion gap 14 5 - 15    Comment: Performed at [facility] [address], [place], [place] [address]  Magnesium     Status: None   Collection Time: 02/04/23  9:54 AM  Result Value Ref Range   Magnesium 2.4 1.7 - 2.4 mg/dL    Comment: Performed at [facility] [address] [place], [place] [address]  CBC     Status: Abnormal   Collection Time: 02/04/23  9:54 AM  Result Value Ref Range   WBC 4.6 4.0 - 10.5 K/uL   RBC 3.99 (L) 4.22 - 5.81 MIL/uL   Hemoglobin 11.7 (L) 13.0 - 17.0 g/dL   HCT 35.5 (L) 39.0 - 52.0 %   MCV 89.0 80.0 - 100.0 fL   MCH 29.3 26.0 - 34.0 pg   MCHC 33.0 30.0 - 36.0 g/dL   RDW 16.4 (H) 11.5 - 15.5 %   Platelets 168 150 - 400 K/uL   nRBC 0.0 0.0 - 0.2 %    Comment: Performed at [facility], [place] [address], [place], [place] [address]   DG Chest 2 View  Result Date: 02/04/2023 CLINICAL DATA:  Bilateral basilar rales EXAM: CHEST - 2 VIEW COMPARISON:  01/27/2023 FINDINGS: Cardiac shadow is enlarged. Aortic calcifications are noted. Lungs are well aerated bilaterally. No focal infiltrate is seen. Small right-sided pleural effusion is noted. No bony abnormality is seen. IMPRESSION: Right-sided pleural effusion.  No other focal abnormality is noted. Electronically Signed   By: Inez Catalina M.D.   On: 02/04/2023 01:00    Pending Labs Unresulted Labs (From admission, onward)     Start     Ordered   02/05/23 0500  Comprehensive metabolic panel  Tomorrow morning,   R        02/04/23 0853   02/05/23 0500  CBC  Tomorrow morning,   R        02/04/23 0853            Vitals/Pain Today's Vitals   02/04/23 1155 02/04/23 1230 02/04/23 1300 02/04/23 1330  BP: 115/85 (!) 110/92 119/86 110/83  Pulse: 89 83 82 95  Resp: 17 (!) 0 20 14  Temp: 98 F (36.7 C)     TempSrc:       SpO2: 99% 99% 91% 99%  Weight:      Height:      PainSc: 0-No pain 0-No pain      Isolation Precautions No active isolations  Medications Medications  apixaban (ELIQUIS) tablet 2.5 mg (2.5 mg Oral Given 02/04/23 0950)  metoprolol succinate (TOPROL-XL) 24 hr tablet 100 mg (100 mg Oral Given 02/04/23 0950)  furosemide (LASIX) injection 60 mg (has no administration in time range)  sodium chloride flush (NS) 0.9 % injection 3 mL (3 mLs Intravenous Given 02/04/23 1011)  isosorbide mononitrate (IMDUR) 24 hr tablet 30 mg (30 mg Oral Given 02/04/23 0950)  acetaminophen (TYLENOL) tablet 650 mg (has no administration in time range)    Or  acetaminophen (TYLENOL) suppository 650 mg (has no administration in time range)  albuterol (PROVENTIL) (2.5 MG/3ML) 0.083% nebulizer solution 2.5 mg (has no administration in time range)  multivitamin with minerals tablet 1 tablet (1 tablet Oral Given 02/04/23 0950)  furosemide (LASIX) injection 60 mg (60 mg Intravenous  Given 02/04/23 0217)    Mobility walks     Focused Assessments Cardiac Assessment Handoff:  Cardiac Rhythm: Atrial fibrillation No results found for: "CKTOTAL", "CKMB", "CKMBINDEX", "TROPONINI" Lab Results  Component Value Date   DDIMER 6.07 (H) 10/24/2022   Does the Patient currently have chest pain? No   , Pulmonary Assessment Handoff:  Lung sounds:   O2 Device: Room Air      R Recommendations: See Admitting Provider Note  Report given to:   Additional Notes: aox4, ambulatory, using urina;l.  No pain

## 2023-02-04 NOTE — Assessment & Plan Note (Addendum)
He had an echo done yesterday, LVEF 20 to 25% (30 to 35% in 10/2022).  He is symptomatic with orthopnea and PND. Recommend resuming furosemide at 20 mg daily, we will adjust dose according to lab results. Continue annual low-salt diet and avoid drinking more than 1 to 1.2 L/day of fluids. Depending of renal function we could consider adding Jardiance 10 mg daily. Continue metoprolol succinate 100 mg daily. Pending appointment with cardiologist. Clearly instructed about warning signs.

## 2023-02-05 DIAGNOSIS — R570 Cardiogenic shock: Secondary | ICD-10-CM

## 2023-02-05 DIAGNOSIS — I4819 Other persistent atrial fibrillation: Secondary | ICD-10-CM

## 2023-02-05 DIAGNOSIS — I5020 Unspecified systolic (congestive) heart failure: Secondary | ICD-10-CM

## 2023-02-05 LAB — COMPREHENSIVE METABOLIC PANEL
ALT: 64 U/L — ABNORMAL HIGH (ref 0–44)
AST: 40 U/L (ref 15–41)
Albumin: 3 g/dL — ABNORMAL LOW (ref 3.5–5.0)
Alkaline Phosphatase: 131 U/L — ABNORMAL HIGH (ref 38–126)
Anion gap: 9 (ref 5–15)
BUN: 111 mg/dL — ABNORMAL HIGH (ref 8–23)
CO2: 22 mmol/L (ref 22–32)
Calcium: 8.7 mg/dL — ABNORMAL LOW (ref 8.9–10.3)
Chloride: 108 mmol/L (ref 98–111)
Creatinine, Ser: 3.69 mg/dL — ABNORMAL HIGH (ref 0.61–1.24)
GFR, Estimated: 15 mL/min — ABNORMAL LOW (ref >60)
Glucose, Bld: 112 mg/dL — ABNORMAL HIGH (ref 70–99)
Potassium: 4.3 mmol/L (ref 3.5–5.1)
Sodium: 139 mmol/L (ref 135–145)
Total Bilirubin: 0.9 mg/dL (ref 0.3–1.2)
Total Protein: 6.3 g/dL — ABNORMAL LOW (ref 6.5–8.1)

## 2023-02-05 LAB — CBC
HCT: 36.7 % — ABNORMAL LOW (ref 39.0–52.0)
Hemoglobin: 12.3 g/dL — ABNORMAL LOW (ref 13.0–17.0)
MCH: 29.1 pg (ref 26.0–34.0)
MCHC: 33.5 g/dL (ref 30.0–36.0)
MCV: 86.8 fL (ref 80.0–100.0)
Platelets: 193 10*3/uL (ref 150–400)
RBC: 4.23 MIL/uL (ref 4.22–5.81)
RDW: 16.2 % — ABNORMAL HIGH (ref 11.5–15.5)
WBC: 5 10*3/uL (ref 4.0–10.5)
nRBC: 0 % (ref 0.0–0.2)

## 2023-02-05 LAB — LACTIC ACID, PLASMA: Lactic Acid, Venous: 1.3 mmol/L (ref 0.5–1.9)

## 2023-02-05 MED ORDER — FUROSEMIDE 10 MG/ML IJ SOLN
120.0000 mg | Freq: Two times a day (BID) | INTRAVENOUS | Status: DC
  Administered 2023-02-05 – 2023-02-06 (×3): 120 mg via INTRAVENOUS
  Filled 2023-02-05: qty 12
  Filled 2023-02-05: qty 2
  Filled 2023-02-05: qty 10
  Filled 2023-02-05: qty 12
  Filled 2023-02-05: qty 10

## 2023-02-05 MED ORDER — FUROSEMIDE 10 MG/ML IJ SOLN
60.0000 mg | Freq: Two times a day (BID) | INTRAMUSCULAR | Status: DC
  Administered 2023-02-05: 60 mg via INTRAVENOUS
  Filled 2023-02-05: qty 6

## 2023-02-05 NOTE — Consult Note (Signed)
Reason for Consult:AKI w/ CKD IV Referring Physician: Fuller Plan, MD  Chief Complaint: AKI w/ CKD IV  Assessment/Plan: AKI w/ CKD IV Patient comes in after recent hospitalization for ARF w/ CKD IV. Patient was d/c w/ Cr 3.04, increased to 4.31 on this admission. Cr improving at 3.69 (4.0) this morning, after diuresis yesterday w/ lasix 60 mg IV x 2. UOP 1.4 L yesterday. Cr elevation on admission thought to be 2/2 to volume overload status of patient, it is likely that his home diuresis was insufficient. Will diurese today and continue to monitor.  -Continue IV lasix 60 mg x BID -AM BMP  HFrEF Echo from 02/02/23 showed EF 20-25% w/ severely decreased LV function and global hypokinesis, RV systolic function severely reduced, w/ moderately elevated PA systolic pressures, w/ small  pericardial effusion. Patient volume status improved, w/ clear lung exam, and 1+ pitting edema in LE to level of distal shin.  -Continue IV lasix 60 mg BID  -Strict I/O -K> 4.0 and Mg > 2.0 -Plans per primary team Normocytic anemia Hgb 12.3 (11.7) this am w/ baseline being around ~12 last admission. Likely 2/2 to CKD -Plans per primary team HTN BP 0000000 systolic, well controlled.  -Plans per primary team A. Fib HR irregularly irregular this morning, patient currently in A. Fib, but rate controlled to 90's. Patient continued on home Eliquis and metoprolol.  -Plans per primary team   HPI: Douglas Matthews is an 88 y.o. male  with medical history significant of hypertension, hyperlipidemia, HFrEF (last EF 20-25% on 02/02/23), atrial fibrillation/atrial flutter, anemia, CKD stage IV, and BPH, presenting with an abnormal lab work. Patient states that he was seen by his primary care doctor and told him that his creatinine levels were high, and he should be seen in ED. Patient recently admitted 2/27-3/1 for ARF w/ CKD IV, thought to be 2/2 GI losses from gastroenteritis. He was treated w/ IVF with creatinine trending  down to 3.04 from 4.13 on discharge. After hospitalization, patient noticed that he gained 3 lbs, had SOB, and his legs started swelling, shortly after d/c.  Patient follows w/ Dr. Marval Regal for nephrology, noting they had spoken about the possibility of HD over a year ago, but it has not come up since.   On admission 3/5, labs were significant for creatinine 4.31, BUN 116, BNP 878. CXR showed right sided pleural effusion. He was started on 60 mg IV Lasix   Chemistry and CBC: Creatinine  Date/Time Value Ref Range Status  04/17/2022 12:00 AM 2.6 (A) 0.6 - 1.3 Final  08/26/2016 12:00 AM 2.4 (A) 0.6 - 1.3 mg/dL Final   Creat  Date/Time Value Ref Range Status  06/13/2020 01:46 PM 2.37 (H) 0.70 - 1.11 mg/dL Final    Comment:    For patients >55 years of age, the reference limit for Creatinine is approximately 13% higher for people identified as African-American. .    Creatinine, Ser  Date/Time Value Ref Range Status  02/04/2023 09:54 AM 4.00 (H) 0.61 - 1.24 mg/dL Final  02/03/2023 07:36 PM 4.31 (H) 0.61 - 1.24 mg/dL Final  02/03/2023 03:01 PM 4.02 (H) 0.40 - 1.50 mg/dL Final  01/30/2023 04:52 AM 3.04 (H) 0.61 - 1.24 mg/dL Final  01/29/2023 01:54 AM 3.54 (H) 0.61 - 1.24 mg/dL Final  01/28/2023 11:21 AM 3.59 (H) 0.61 - 1.24 mg/dL Final  01/28/2023 02:04 AM 3.84 (H) 0.61 - 1.24 mg/dL Final  01/27/2023 08:38 PM 3.93 (H) 0.61 - 1.24 mg/dL Final  01/27/2023 04:33  PM 4.13 (H) 0.61 - 1.24 mg/dL Final  11/11/2022 11:46 AM 2.71 (H) 0.76 - 1.27 mg/dL Final  11/03/2022 12:24 PM 2.78 (H) 0.76 - 1.27 mg/dL Final  10/30/2022 03:58 AM 2.95 (H) 0.61 - 1.24 mg/dL Final  10/29/2022 03:26 AM 2.75 (H) 0.61 - 1.24 mg/dL Final  10/28/2022 11:56 AM 2.46 (H) 0.61 - 1.24 mg/dL Final  10/27/2022 02:19 AM 2.43 (H) 0.61 - 1.24 mg/dL Final  10/25/2022 06:15 AM 2.15 (H) 0.61 - 1.24 mg/dL Final  10/24/2022 03:46 PM 2.41 (H) 0.61 - 1.24 mg/dL Final  04/29/2019 01:10 PM 2.12 (H) 0.40 - 1.50 mg/dL Final   04/19/2018 09:22 AM 1.93 (H) 0.40 - 1.50 mg/dL Final  04/22/2016 11:08 AM 2.09 (H) 0.40 - 1.50 mg/dL Final  04/26/2015 10:11 AM 2.18 (H) 0.40 - 1.50 mg/dL Final  01/09/2014 10:33 AM 2.2 (H) 0.4 - 1.5 mg/dL Final  12/09/2012 05:32 PM 2.99 (H) 0.50 - 1.35 mg/dL Final  12/29/2011 09:52 AM 2.1 (H) 0.4 - 1.5 mg/dL Final  12/25/2011 12:59 PM 2.8 (H) 0.4 - 1.5 mg/dL Final  12/15/2011 11:37 AM 3.2 (H) 0.4 - 1.5 mg/dL Final  09/19/2010 10:45 AM 2.0 (H) 0.4 - 1.5 mg/dL Final  06/13/2008 02:10 PM 1.7 (H) 0.4 - 1.5 mg/dL Final   Recent Labs  Lab 01/30/23 0452 02/03/23 1501 02/03/23 1936 02/04/23 0954  NA 140 142 141 141  K 4.4 4.7 5.0 4.7  CL 111 109 110 111  CO2 19* 20 22 16*  GLUCOSE 103* 86 113* 87  BUN 80* 117* 116* 124*  CREATININE 3.04* 4.02* 4.31* 4.00*  CALCIUM 8.4* 8.8 8.7* 8.5*    Recent Labs  Lab 02/03/23 1501 02/03/23 1936 02/04/23 0954  WBC 5.1 5.2 4.6  NEUTROABS  --  3.4  --   HGB 12.0* 11.8* 11.7*  HCT 36.7* 37.5* 35.5*  MCV 88.1 90.1 89.0  PLT 175.0 188 168    Liver Function Tests: Recent Labs  Lab 02/04/23 0954  AST 48*  ALT 71*  ALKPHOS 136*  BILITOT 0.9  PROT 6.1*  ALBUMIN 3.0*   No results for input(s): "LIPASE", "AMYLASE" in the last 168 hours. No results for input(s): "AMMONIA" in the last 168 hours. Cardiac Enzymes: No results for input(s): "CKTOTAL", "CKMB", "CKMBINDEX", "TROPONINI" in the last 168 hours. Iron Studies: No results for input(s): "IRON", "TIBC", "TRANSFERRIN", "FERRITIN" in the last 72 hours. PT/INR: '@LABRCNTIP'$ (inr:5)  Xrays/Other Studies: ) Results for orders placed or performed during the hospital encounter of 02/03/23 (from the past 48 hour(s))  Basic metabolic panel     Status: Abnormal   Collection Time: 02/03/23  7:36 PM  Result Value Ref Range   Sodium 141 135 - 145 mmol/L   Potassium 5.0 3.5 - 5.1 mmol/L   Chloride 110 98 - 111 mmol/L   CO2 22 22 - 32 mmol/L   Glucose, Bld 113 (H) 70 - 99 mg/dL    Comment:  Glucose reference range applies only to samples taken after fasting for at least 8 hours.   BUN 116 (H) 8 - 23 mg/dL   Creatinine, Ser 4.31 (H) 0.61 - 1.24 mg/dL   Calcium 8.7 (L) 8.9 - 10.3 mg/dL   GFR, Estimated 13 (L) >60 mL/min    Comment: (NOTE) Calculated using the CKD-EPI Creatinine Equation (2021)    Anion gap 9 5 - 15    Comment: Performed at [facility] [address], [place], [place] [address]  CBC with Differential  Status: Abnormal   Collection Time: 02/03/23  7:36 PM  Result Value Ref Range   WBC 5.2 4.0 - 10.5 K/uL   RBC 4.16 (L) 4.22 - 5.81 MIL/uL   Hemoglobin 11.8 (L) 13.0 - 17.0 g/dL   HCT 37.5 (L) 39.0 - 52.0 %   MCV 90.1 80.0 - 100.0 fL   MCH 28.4 26.0 - 34.0 pg   MCHC 31.5 30.0 - 36.0 g/dL   RDW 16.5 (H) 11.5 - 15.5 %   Platelets 188 150 - 400 K/uL   nRBC 0.0 0.0 - 0.2 %   Neutrophils Relative % 66 %   Neutro Abs 3.4 1.7 - 7.7 K/uL   Lymphocytes Relative 16 %   Lymphs Abs 0.9 0.7 - 4.0 K/uL   Monocytes Relative 17 %   Monocytes Absolute 0.9 0.1 - 1.0 K/uL   Eosinophils Relative 1 %   Eosinophils Absolute 0.1 0.0 - 0.5 K/uL   Basophils Relative 0 %   Basophils Absolute 0.0 0.0 - 0.1 K/uL   Immature Granulocytes 0 %   Abs Immature Granulocytes 0.02 0.00 - 0.07 K/uL    Comment: Performed at [facility], [address] [place], [place] [address]  Brain natriuretic peptide     Status: Abnormal   Collection Time: 02/03/23  7:36 PM  Result Value Ref Range   B Natriuretic Peptide 878.0 (H) 0.0 - 100.0 pg/mL    Comment: Performed at [facility] [address], [place], [place] [address]  Urinalysis, Routine w reflex microscopic -Urine, Clean Catch     Status: Abnormal   Collection Time: 02/04/23  3:18 AM  Result Value Ref Range   Color, Urine YELLOW YELLOW   APPearance CLEAR CLEAR   Specific Gravity, Urine 1.012 1.005 - 1.030   pH 5.0 5.0 - 8.0   Glucose, UA NEGATIVE NEGATIVE mg/dL   Hgb urine dipstick NEGATIVE NEGATIVE    Bilirubin Urine NEGATIVE NEGATIVE   Ketones, ur NEGATIVE NEGATIVE mg/dL   Protein, ur 30 (A) NEGATIVE mg/dL   Nitrite NEGATIVE NEGATIVE   Leukocytes,Ua NEGATIVE NEGATIVE   RBC / HPF 0-5 0 - 5 RBC/hpf   WBC, UA 0-5 0 - 5 WBC/hpf   Bacteria, UA NONE SEEN NONE SEEN   Squamous Epithelial / HPF 0-5 0 - 5 /HPF   Mucus PRESENT     Comment: Performed at [facility], [place] [address] [place], [place] [address]  Comprehensive metabolic panel     Status: Abnormal   Collection Time: 02/04/23  9:54 AM  Result Value Ref Range   Sodium 141 135 - 145 mmol/L   Potassium 4.7 3.5 - 5.1 mmol/L   Chloride 111 98 - 111 mmol/L   CO2 16 (L) 22 - 32 mmol/L   Glucose, Bld 87 70 - 99 mg/dL    Comment: Glucose reference range applies only to samples taken after fasting for at least 8 hours.   BUN 124 (H) 8 - 23 mg/dL   Creatinine, Ser 4.00 (H) 0.61 - 1.24 mg/dL   Calcium 8.5 (L) 8.9 - 10.3 mg/dL   Total Protein 6.1 (L) 6.5 - 8.1 g/dL   Albumin 3.0 (L) 3.5 - 5.0 g/dL   AST 48 (H) 15 - 41 U/L   ALT 71 (H) 0 - 44 U/L   Alkaline Phosphatase 136 (H) 38 - 126 U/L   Total Bilirubin 0.9 0.3 - 1.2 mg/dL   GFR, Estimated 14 (L) >60 mL/min    Comment: (  NOTE) Calculated using the CKD-EPI Creatinine Equation (2021)    Anion gap 14 5 - 15    Comment: Performed at [facility], [place] [address] [place], [place] [address]  Magnesium     Status: None   Collection Time: 02/04/23  9:54 AM  Result Value Ref Range   Magnesium 2.4 1.7 - 2.4 mg/dL    Comment: Performed at [facility] [address], [place], [place] [address]  CBC     Status: Abnormal   Collection Time: 02/04/23  9:54 AM  Result Value Ref Range   WBC 4.6 4.0 - 10.5 K/uL   RBC 3.99 (L) 4.22 - 5.81 MIL/uL   Hemoglobin 11.7 (L) 13.0 - 17.0 g/dL   HCT 35.5 (L) 39.0 - 52.0 %   MCV 89.0 80.0 - 100.0 fL   MCH 29.3 26.0 - 34.0 pg   MCHC 33.0 30.0 - 36.0 g/dL   RDW 16.4 (H) 11.5 - 15.5 %   Platelets 168 150 - 400 K/uL   nRBC 0.0 0.0 - 0.2  %    Comment: Performed at [facility], [place] [address] [place], [place] [address]   DG Chest 2 View  Result Date: 02/04/2023 CLINICAL DATA:  Bilateral basilar rales EXAM: CHEST - 2 VIEW COMPARISON:  01/27/2023 FINDINGS: Cardiac shadow is enlarged. Aortic calcifications are noted. Lungs are well aerated bilaterally. No focal infiltrate is seen. Small right-sided pleural effusion is noted. No bony abnormality is seen. IMPRESSION: Right-sided pleural effusion.  No other focal abnormality is noted. Electronically Signed   By: Inez Catalina M.D.   On: 02/04/2023 01:00    PMH:   Past Medical History:  Diagnosis Date   Anemia    Atrial fibrillation (HCC)    BPH (benign prostatic hypertrophy)    Chronic kidney disease    Diverticulosis    Hyperlipidemia    Hypertension     PSH:   Past Surgical History:  Procedure Laterality Date   APPENDECTOMY     CARDIOVERSION N/A 10/29/2022   Procedure: CARDIOVERSION;  Surgeon: Lelon Perla, MD;  Location: [facility] ENDOSCOPY;  Service: Cardiovascular;  Laterality: N/A;   HERNIA REPAIR     TEE WITHOUT CARDIOVERSION N/A 10/29/2022   Procedure: TRANSESOPHAGEAL ECHOCARDIOGRAM (TEE);  Surgeon: Lelon Perla, MD;  Location: [facility] ENDOSCOPY;  Service: Cardiovascular;  Laterality: N/A;    Allergies: No Known Allergies  Medications:   Prior to Admission medications   Medication Sig Start Date End Date Taking? Authorizing Provider  apixaban (ELIQUIS) 2.5 MG TABS tablet Take 1 tablet (2.5 mg total) by mouth 2 (two) times daily. 11/21/22  Yes Nahser, Wonda Cheng, MD  COD LIVER OIL PO Take 1 tablet by mouth daily.   Yes [provider]  furosemide (LASIX) 40 MG tablet Take 1 tablet (40 mg total) by mouth daily as needed for edema or fluid (as needed in case of weight gain 2 to 3 lbs in 24 hrs or 5 lbs in 7 days, leg swelling or shortness of breath.). 11/21/22  Yes Nahser, Wonda Cheng, MD  GARLIC PO Take 1 tablet by mouth daily.   Yes [provider]  hydrALAZINE (APRESOLINE) 10 MG tablet Take 1 tablet (10 mg total) by mouth 3 (three) times daily. 11/21/22  Yes Nahser, Wonda Cheng, MD  metoprolol succinate (TOPROL XL) 100 MG 24 hr tablet Take 1 tablet (100 mg total) by mouth daily. 01/31/23  Yes Annita Brod, MD  Multiple Vitamin (  MULTIVITAMIN) capsule Take 1 capsule by mouth daily.   Yes [provider]  Saw Palmetto 450 MG CAPS Take 900 mg by mouth daily.   Yes [provider]  isosorbide mononitrate (IMDUR) 30 MG 24 hr tablet Take 1 tablet (30 mg total) by mouth daily. 11/21/22   Nahser, Wonda Cheng, MD  ondansetron (ZOFRAN) 4 MG tablet Take 0.5-1 tablets (2-4 mg total) by mouth every 12 (twelve) hours as needed for nausea or vomiting. Patient not taking: Reported on 02/04/2023 02/03/23   Martinique, [name], MD    Discontinued Meds:   Medications Discontinued During This Encounter  Medication Reason   multivitamin capsule 1 capsule Not available    Social History:  reports that he has never smoked. He has never used smokeless tobacco. He reports that he does not drink alcohol and does not use drugs.  Family History:   Family History  Problem Relation Age of Onset   Heart disease Other     Blood pressure 131/88, pulse 99, temperature 98 F (36.7 C), resp. rate 18, height '5\' 8"'$  (1.727 m), weight 68.2 kg, SpO2 98 %. General appearance: alert, cooperative, appears stated age, and no distress Resp: CTABL, no crackles, wheezes or rhonci Cardio: irregularly irregular rhythm, S1, S2 normal, no S3 or S4, no click, no rub, and no murmur GI: soft, non-tender; bowel sounds normal; no masses,  no organomegaly Extremities: edema 1+ pitting edema to level of distal shin       Holley Bouche, MD 02/05/2023, 7:43 AM

## 2023-02-05 NOTE — Progress Notes (Addendum)
PROGRESS NOTE Douglas Matthews  PYP:[id number] DOB: 07/17/1935 DOA: 02/03/2023 PCP: Martinique, [name], MD  Brief Narrative/Hospital Course: 35 old male with history of hypertension, hyperlipidemia, A-fib, CKD-IV/V ~ creat 2.7, chronic HFrEF (EF 20 to 25%), seen by primary care provider for evaluation of elevated renal function/AKI . He was recently admitted 2/27 - 3/1 for mild AKI.  Nausea vomiting prerenal insult also had high A-fib burden metoprolol was increased and discharged on PRN Lasix he turns with worsening renal function also with Right-sided pleural effusion/peripheral edema.  Nephrology was consulted and admitted for further management     Subjective: Seen and examined, Overnight vitals/labs/events reviewed  He states he feels much improved today He is on room air. Has been afebrile BP in 245 8/0/9983 systolic, on room air Overnight labs reviewed creatinine downtrending 4 BUN elevated 124 bicarb 16 stable CBC Weight 8130.3 pounds from 3/5 recheck ordered UOP 1400 cc and also had some unmeasured output  Assessment and Plan: Active Problems:   Heart failure with reduced ejection fraction (HCC)   Acute kidney injury superimposed on chronic kidney disease (HCC)   Nausea   Normocytic anemia   HTN (hypertension)   Persistent atrial fibrillation (HCC)   Chronic anticoagulation   AKI on CKD 4/5 Metabolic acidosis Uremia with BUN  >100: Baseline creatinine on last d/c 3.0, nephrology following now w/ AKI and with decompensated CHF. ?candidate for HD- will involve heart failure team ,diuresis as per nephrology  Acute on chronic systolic CHF: Diuresis as able per nephrology, GDMT limited due to  dysfunction, on Imdur, Toprol 100.  I have consulted cardiology.Monitor strict I/O,daly weight, electrolytes, Cont salt and fluid restricted diet. Net IO Since Admission: -2,585 mL [02/05/23 1029]  Filed Weights   02/03/23 1921 02/05/23 0701  Weight: 68.2 kg 62.8 kg    Nausea:  Improved-suspect uremic symptom, Cont antiemetics as needed Normocytic anemia: hb stable, monitor  Essential hypertension: BP well-controlled continue Imdur metoprolol for now Persistent atrial fibrillation on chronic anticoagulation: Continue Eliquis, metoprolol.  Monitor on telemetry  DVT prophylaxis: apixaban (ELIQUIS) tablet 2.5 mg Start: 02/04/23 1000 Code Status:   Code Status: Full Code Family Communication: plan of care discussed with patient at bedside. Patient status is: Inpatient because of renal failure and heart failure Level of care: Telemetry Medical   Dispo: The patient is from: home w/ wife            Anticipated disposition: home ~ 2-3 days Objective: Vitals last 24 hrs: Vitals:   02/04/23 1721 02/04/23 1722 02/04/23 1958 02/05/23 0603  BP: (!) 130/91 (!) 130/91 119/87 131/88  Pulse:  96 93 99  Resp:   18 18  Temp: (!) 97.5 F (36.4 C) (!) 97.5 F (36.4 C)  98 F (36.7 C)  TempSrc: Oral Oral    SpO2: 100% 100% 100% 98%  Weight:      Height:       Weight change:   Physical Examination: General exam: alert awake, older than stated age HEENT:Oral mucosa moist, Ear/Nose WNL grossly Respiratory system: bilaterally mild basal crackles at the base BS, no use of accessory muscle Cardiovascular system: S1 & S2 +, No JVD. Gastrointestinal system: Abdomen soft,NT,ND, BS+ Nervous System:Alert, awake, moving extremities. Extremities: LE edema + on b/l ankles ,distal peripheral pulses palpable.  Skin: No rashes,no icterus. MSK: Normal muscle bulk,tone, power  Medications reviewed:  Scheduled Meds:  apixaban  2.5 mg Oral BID   furosemide  60 mg Intravenous BID   isosorbide mononitrate  30 mg Oral Daily   metoprolol succinate  100 mg Oral Daily   multivitamin with minerals  1 tablet Oral Daily   sodium chloride flush  3 mL Intravenous Q12H   Continuous Infusions:    Diet Order             Diet Heart Room service appropriate? Yes; Fluid consistency: Thin;  Fluid restriction: 1200 mL Fluid  Diet effective now                  Intake/Output Summary (Last 24 hours) at 02/05/2023 0806 Last data filed at 02/05/2023 0655 Gross per 24 hour  Intake 230 ml  Output 1400 ml  Net -1170 ml   Net IO Since Admission: -2,085 mL [02/05/23 0806]  Wt Readings from Last 3 Encounters:  02/03/23 68.2 kg  02/03/23 68.2 kg  01/27/23 62 kg     Unresulted Labs (From admission, onward)     Start     Ordered   02/05/23 0500  Comprehensive metabolic panel  Tomorrow morning,   R        02/04/23 0853          Data Reviewed: I have personally reviewed following labs and imaging studies CBC: Recent Labs  Lab 02/03/23 1501 02/03/23 1936 02/04/23 0954 02/05/23 0735  WBC 5.1 5.2 4.6 5.0  NEUTROABS  --  3.4  --   --   HGB 12.0* 11.8* 11.7* 12.3*  HCT 36.7* 37.5* 35.5* 36.7*  MCV 88.1 90.1 89.0 86.8  PLT 175.0 188 168 803   Basic Metabolic Panel: Recent Labs  Lab 01/30/23 0452 02/03/23 1501 02/03/23 1936 02/04/23 0954  NA 140 142 141 141  K 4.4 4.7 5.0 4.7  CL 111 109 110 111  CO2 19* 20 22 16*  GLUCOSE 103* 86 113* 87  BUN 80* 117* 116* 124*  CREATININE 3.04* 4.02* 4.31* 4.00*  CALCIUM 8.4* 8.8 8.7* 8.5*  MG  --   --   --  2.4   GFR: Estimated Creatinine Clearance: 13 mL/min (A) (by C-G formula based on SCr of 4 mg/dL (H)). Liver Function Tests: Recent Labs  Lab 02/04/23 0954  AST 48*  ALT 71*  ALKPHOS 136*  BILITOT 0.9  PROT 6.1*  ALBUMIN 3.0*   Recent Labs    02/03/23 1501  PROBNP 1,760.0*   Recent Results (from the past 240 hour(s))  Resp panel by RT-PCR (RSV, Flu A&B, Covid) Anterior Nasal Swab     Status: None   Collection Time: 01/27/23  7:24 PM   Specimen: Anterior Nasal Swab  Result Value Ref Range Status   SARS Coronavirus 2 by RT PCR NEGATIVE NEGATIVE Final    Comment: (NOTE) SARS-CoV-2 target nucleic acids are NOT DETECTED.  The SARS-CoV-2 RNA is generally detectable in upper respiratory specimens during  the acute phase of infection. The lowest concentration of SARS-CoV-2 viral copies this assay can detect is 138 copies/mL. A negative result does not preclude SARS-Cov-2 infection and should not be used as the sole basis for treatment or other patient management decisions. A negative result may occur with  improper specimen collection/handling, submission of specimen other than nasopharyngeal swab, presence of viral mutation(s) within the areas targeted by this assay, and inadequate number of viral copies(<138 copies/mL). A negative result must be combined with clinical observations, patient history, and epidemiological information. The expected result is Negative.  Fact Sheet for Patients:  EntrepreneurPulse.com.au  Fact Sheet for Healthcare Providers:  IncredibleEmployment.be  This test is  no t yet approved or cleared by the [place] and  has been authorized for detection and/or diagnosis of SARS-CoV-2 by FDA under an Emergency Use Authorization (EUA). This EUA will remain  in effect (meaning this test can be used) for the duration of the COVID-19 declaration under Section 564(b)(1) of the Act, 21 U.S.C.section 360bbb-3(b)(1), unless the authorization is terminated  or revoked sooner.       Influenza A by PCR NEGATIVE NEGATIVE Final   Influenza B by PCR NEGATIVE NEGATIVE Final    Comment: (NOTE) The Xpert Xpress SARS-CoV-2/FLU/RSV plus assay is intended as an aid in the diagnosis of influenza from Nasopharyngeal swab specimens and should not be used as a sole basis for treatment. Nasal washings and aspirates are unacceptable for Xpert Xpress SARS-CoV-2/FLU/RSV testing.  Fact Sheet for Patients: EntrepreneurPulse.com.au  Fact Sheet for Healthcare Providers: IncredibleEmployment.be  This test is not yet approved or cleared by the [place] FDA and has been authorized for detection and/or  diagnosis of SARS-CoV-2 by FDA under an Emergency Use Authorization (EUA). This EUA will remain in effect (meaning this test can be used) for the duration of the COVID-19 declaration under Section 564(b)(1) of the Act, 21 U.S.C. section 360bbb-3(b)(1), unless the authorization is terminated or revoked.     Resp Syncytial Virus by PCR NEGATIVE NEGATIVE Final    Comment: (NOTE) Fact Sheet for Patients: EntrepreneurPulse.com.au  Fact Sheet for Healthcare Providers: IncredibleEmployment.be  This test is not yet approved or cleared by the [place] FDA and has been authorized for detection and/or diagnosis of SARS-CoV-2 by FDA under an Emergency Use Authorization (EUA). This EUA will remain in effect (meaning this test can be used) for the duration of the COVID-19 declaration under Section 564(b)(1) of the Act, 21 U.S.C. section 360bbb-3(b)(1), unless the authorization is terminated or revoked.  Performed at [facility], [address], [place], [place] [address]     Antimicrobials: Anti-infectives (From admission, onward)    None      Culture/Microbiology    Component Value Date/Time   SDES  10/24/2022 1658    BLOOD RIGHT FOREARM Performed at [facility] [address], [place], [place] [address]    [facility]  10/24/2022 1658    BOTTLES DRAWN AEROBIC AND ANAEROBIC Blood Culture adequate volume Performed at [facility], [address], [place], [place] [address]    CULT (A) 10/24/2022 1658    PROPIONIBACTERIUM ACNES Standardized susceptibility testing for this organism is not available. Performed at [facility], [place] [address], [place], [place] [address]    REPTSTATUS 10/31/2022 FINAL 10/24/2022 1658  Radiology Studies: DG Chest 2 View  Result Date: 02/04/2023 CLINICAL DATA:  Bilateral basilar rales EXAM: CHEST - 2 VIEW COMPARISON:  01/27/2023 FINDINGS: Cardiac shadow is  enlarged. Aortic calcifications are noted. Lungs are well aerated bilaterally. No focal infiltrate is seen. Small right-sided pleural effusion is noted. No bony abnormality is seen. IMPRESSION: Right-sided pleural effusion.  No other focal abnormality is noted. Electronically Signed   By: Inez Catalina M.D.   On: 02/04/2023 01:00     LOS: 1 day   Antonieta Pert, MD [facility]  02/05/2023, 8:06 AM

## 2023-02-05 NOTE — Consult Note (Addendum)
Cardiology Consultation   Patient ID: CASMERE MAINER MRN: [id number]; DOB: February 14, 1935  Admit date: 02/03/2023 Date of Consult: 02/05/2023  PCP:  [place], [name], [place] [facility] Cardiologist:  Mertie Moores, MD      Nephrologist: Dr. Arty Baumgartner  Patient Profile:   Douglas Matthews is a 88 y.o. male with a hx of hypertension, hyperlipidemia, LBBB, CKD stage IV and persistent atrial fibrillation who is being seen 02/05/2023 for the evaluation of dyspnea at the request of Dr. Lupita Leash.  History of Present Illness:   [name] is a 88 year old male with past medical history of hypertension, hyperlipidemia, LBBB, CKD stage IV and persistent atrial fibrillation.  Patient was admitted was pneumonia in November 2023 and was found to be in atrial flutter with RVR on arrival.  He also had new left bundle branch block.  Chest x-ray showed right lower lobe infiltrate.  Echocardiogram obtained on 10/25/2022 showed EF 25 to 30%, moderately reduced RVEF, RV systolic pressure 123456 mmHg, mild to moderate LAE, mild-moderate MR, mild to moderate aortic stenosis.  He required IV diuresis.  He ultimately underwent successful TEE DCCV by Dr. Stanford Breed on 10/29/2022.  EF was 30 to 35% by TEE.  He was placed on Eliquis 2.5 mg twice a day.  He was also on Jardiance and carvedilol.  He was also on hydralazine/Imdur for cardiomyopathy.  By the time he saw Dr. Acie Fredrickson back on 01/20/2023, it was noted he was back in atrial fibrillation.  Dr. Acie Fredrickson recommended rate control rather than rhythm control strategy at the time. Carvedilol has been switched to metoprolol for better rate control. Patient was readmitted to the hospital from 01/27/2023 until 01/30/2023 with acute on chronic renal insufficiency in the setting of nausea and vomiting.  Creatinine on arrival was up to 4.13, potassium 6.  He was treated with Lokelma, bicarb drip, calcium gluconate and IV fluid.  Creatinine on discharge was 3.0.  Outpatient  echocardiogram obtained on 02/02/2023 showed EF 20 to 25%, global hypokinesis, RVSP 50.3 mmHg, mitral MR, moderate to severe TR, no significant aortic stenosis.   After discharge, patient continued to have significant dyspnea with minimal exertion.  He denies any chest pain.  He has no orthopnea, however wife noted he has been gasping for air last week.  He does snore however never tested for obstructive sleep apnea.  He will he is a former smoker but has no known significant pulmonary issue.  He was sent back to the hospital on 02/04/2023 by his PCP as his creatinine jumped back up to 4.  His leg was swollen.  BNP was 878.  Creatinine 4.31.  Potassium 5.  Hemoglobin 11.8.  Urinalysis was negative for UTI.  Chest x-ray showed right-sided pleural effusion.  His home 40 mg Lasix was initially prescribed as needed, however patient says his PCP have told him to start taking half a tablet on a daily basis.  He was given 2 doses of 60 mg IV Lasix yesterday.  Overnight, he had significant urine output, creatinine improved to 3.69 from 4.31.  Albumin borderline low at 3.0.  Cardiology service consulted for acute on chronic biventricular heart failure.    Past Medical History:  Diagnosis Date   Anemia    Atrial fibrillation (HCC)    BPH (benign prostatic hypertrophy)    Chronic kidney disease    Diverticulosis    Hyperlipidemia    Hypertension     Past Surgical History:  Procedure Laterality Date  APPENDECTOMY     CARDIOVERSION N/A 10/29/2022   Procedure: CARDIOVERSION;  Surgeon: Lelon Perla, MD;  Location: [facility] ENDOSCOPY;  Service: Cardiovascular;  Laterality: N/A;   HERNIA REPAIR     TEE WITHOUT CARDIOVERSION N/A 10/29/2022   Procedure: TRANSESOPHAGEAL ECHOCARDIOGRAM (TEE);  Surgeon: Lelon Perla, MD;  Location: [facility] ENDOSCOPY;  Service: Cardiovascular;  Laterality: N/A;     Home Medications:  Prior to Admission medications   Medication Sig Start Date End Date Taking? Authorizing Provider   apixaban (ELIQUIS) 2.5 MG TABS tablet Take 1 tablet (2.5 mg total) by mouth 2 (two) times daily. 11/21/22  Yes Nahser, Wonda Cheng, MD  COD LIVER OIL PO Take 1 tablet by mouth daily.   Yes [provider]  furosemide (LASIX) 40 MG tablet Take 1 tablet (40 mg total) by mouth daily as needed for edema or fluid (as needed in case of weight gain 2 to 3 lbs in 24 hrs or 5 lbs in 7 days, leg swelling or shortness of breath.). 11/21/22  Yes Nahser, Wonda Cheng, MD  GARLIC PO Take 1 tablet by mouth daily.   Yes [provider]  hydrALAZINE (APRESOLINE) 10 MG tablet Take 1 tablet (10 mg total) by mouth 3 (three) times daily. 11/21/22  Yes Nahser, Wonda Cheng, MD  metoprolol succinate (TOPROL XL) 100 MG 24 hr tablet Take 1 tablet (100 mg total) by mouth daily. 01/31/23  Yes Annita Brod, MD  Multiple Vitamin (MULTIVITAMIN) capsule Take 1 capsule by mouth daily.   Yes [provider]  Saw Palmetto 450 MG CAPS Take 900 mg by mouth daily.   Yes [provider]  isosorbide mononitrate (IMDUR) 30 MG 24 hr tablet Take 1 tablet (30 mg total) by mouth daily. 11/21/22   Nahser, Wonda Cheng, MD  ondansetron (ZOFRAN) 4 MG tablet Take 0.5-1 tablets (2-4 mg total) by mouth every 12 (twelve) hours as needed for nausea or vomiting. Patient not taking: Reported on 02/04/2023 02/03/23   Martinique, [name], MD    Inpatient Medications: Scheduled Meds:  apixaban  2.5 mg Oral BID   furosemide  60 mg Intravenous BID   isosorbide mononitrate  30 mg Oral Daily   metoprolol succinate  100 mg Oral Daily   multivitamin with minerals  1 tablet Oral Daily   sodium chloride flush  3 mL Intravenous Q12H   Continuous Infusions:  PRN Meds: acetaminophen **OR** acetaminophen, albuterol  Allergies:   No Known Allergies  Social History:   Social History   Socioeconomic History   Marital status: Married    Spouse name: Not on file   Number of children: Not on file   Years of education: Not on file    Highest education level: Not on file  Occupational History   Occupation: Retired  Tobacco Use   Smoking status: Never   Smokeless tobacco: Never  Vaping Use   Vaping Use: Never used  Substance and Sexual Activity   Alcohol use: No   Drug use: No   Sexual activity: Not on file  Other Topics Concern   Not on file  Social History Narrative   Married x 50 years    Social Determinants of Health   Financial Resource Strain: Low Risk  (07/08/2022)   Overall Financial Resource Strain (CARDIA)    Difficulty of Paying Living Expenses: Not hard at all  Food Insecurity: No Food Insecurity (02/04/2023)   Hunger Vital Sign    Worried About Charity fundraiser in  the Last Year: Never true    [place] in the Last Year: Never true  Transportation Needs: No Transportation Needs (02/04/2023)   PRAPARE - Hydrologist (Medical): No    Lack of Transportation (Non-Medical): No  Physical Activity: Insufficiently Active (07/08/2022)   Exercise Vital Sign    Days of Exercise per Week: 2 days    Minutes of Exercise per Session: 30 min  Stress: No Stress Concern Present (07/08/2022)   [place]    Feeling of Stress : Not at all  Social Connections: Moderately Integrated (07/02/2021)   Social Connection and Isolation Panel [NHANES]    Frequency of Communication with Friends and Family: Three times a week    Frequency of Social Gatherings with Friends and Family: Three times a week    Attends Religious Services: More than 4 times per year    Active Member of Clubs or Organizations: No    Attends Archivist Meetings: Never    Marital Status: Married  Human resources officer Violence: Not At Risk (02/04/2023)   Humiliation, Afraid, Rape, and Kick questionnaire    Fear of Current or Ex-Partner: No    Emotionally Abused: No    Physically Abused: No    Sexually Abused: No    Family History:    Family History   Problem Relation Age of Onset   Heart disease Other      ROS:  Please see the history of present illness.   All other ROS reviewed and negative.     Physical Exam/Data:   Vitals:   02/04/23 1958 02/05/23 0603 02/05/23 0701 02/05/23 0820  BP: 119/87 131/88  117/85  Pulse: 93 99  100  Resp: '18 18  16  '$ Temp:  98 F (36.7 C)  (!) 97.5 F (36.4 C)  TempSrc:    Oral  SpO2: 100% 98%  99%  Weight:   62.8 kg   Height:        Intake/Output Summary (Last 24 hours) at 02/05/2023 1442 Last data filed at 02/05/2023 0900 Gross per 24 hour  Intake 470 ml  Output 1900 ml  Net -1430 ml      02/05/2023    7:01 AM 02/03/2023    7:21 PM 02/03/2023    2:07 PM  Last 3 Weights  Weight (lbs) 138 lb 7.2 oz 150 lb 5.7 oz 150 lb 4 oz  Weight (kg) 62.8 kg 68.2 kg 68.153 kg     Body mass index is 21.05 kg/m.  General:  Well nourished, well developed, in no acute distress HEENT: normal Neck: no JVD Vascular: No carotid bruits; Distal pulses 2+ bilaterally Cardiac:  normal S1, S2; RRR; no murmur  Lungs:  clear to auscultation bilaterally, no wheezing, rhonchi or rales  Abd: soft, nontender, no hepatomegaly  Ext: no edema Musculoskeletal:  No deformities, BUE and BLE strength normal and equal Skin: warm and dry  Neuro:  CNs 2-12 intact, no focal abnormalities noted Psych:  Normal affect   EKG:  The EKG was personally reviewed and demonstrates:  atrial fibrillation, LBBB Telemetry:  Telemetry was personally reviewed and demonstrates:  atrial fibrillation, HR 80-90s  Relevant CV Studies:  Echo 02/02/2023  1. Left ventricular ejection fraction, by estimation, is 20 to 25%. The  left ventricle has severely decreased function. The left ventricle  demonstrates global hypokinesis. There is moderate left ventricular  hypertrophy. Left ventricular diastolic  parameters are indeterminate.  2. Right ventricular systolic function is severely reduced. The right  ventricular size is normal. There is  moderately elevated pulmonary artery  systolic pressure. The estimated right ventricular systolic pressure is  0000000 mmHg.   3. Left atrial size was mildly dilated.   4. Right atrial size was mildly dilated.   5. A small pericardial effusion is present.   6. The mitral valve is abnormal. Moderate mitral valve regurgitation.  Appears functional. No evidence of mitral stenosis.   7. Tricuspid valve regurgitation is moderate to severe.   8. The aortic valve is calcified. There is moderate calcification of the  aortic valve. Aortic valve regurgitation is trivial. Aortic valve  sclerosis/calcification is present, without any evidence of aortic  stenosis.   9. The inferior vena cava is dilated in size with <50% respiratory  variability, suggesting right atrial pressure of 15 mmHg.   Laboratory Data:  High Sensitivity Troponin:   Recent Labs  Lab 01/27/23 1633 01/27/23 2038  TROPONINIHS 130* 106*     Chemistry Recent Labs  Lab 02/03/23 1936 02/04/23 0954 02/05/23 0735  NA 141 141 139  K 5.0 4.7 4.3  CL 110 111 108  CO2 22 16* 22  GLUCOSE 113* 87 112*  BUN 116* 124* 111*  CREATININE 4.31* 4.00* 3.69*  CALCIUM 8.7* 8.5* 8.7*  MG  --  2.4  --   GFRNONAA 13* 14* 15*  ANIONGAP '9 14 9    '$ Recent Labs  Lab 02/04/23 0954 02/05/23 0735  PROT 6.1* 6.3*  ALBUMIN 3.0* 3.0*  AST 48* 40  ALT 71* 64*  ALKPHOS 136* 131*  BILITOT 0.9 0.9   Lipids No results for input(s): "CHOL", "TRIG", "HDL", "LABVLDL", "LDLCALC", "CHOLHDL" in the last 168 hours.  Hematology Recent Labs  Lab 02/03/23 1936 02/04/23 0954 02/05/23 0735  WBC 5.2 4.6 5.0  RBC 4.16* 3.99* 4.23  HGB 11.8* 11.7* 12.3*  HCT 37.5* 35.5* 36.7*  MCV 90.1 89.0 86.8  MCH 28.4 29.3 29.1  MCHC 31.5 33.0 33.5  RDW 16.5* 16.4* 16.2*  PLT 188 168 193   Thyroid No results for input(s): "TSH", "FREET4" in the last 168 hours.  BNP Recent Labs  Lab 01/30/23 0452 02/03/23 1501 02/03/23 1936  BNP 751.6*  --  878.0*   PROBNP  --  1,760.0*  --     DDimer No results for input(s): "DDIMER" in the last 168 hours.   Radiology/Studies:  DG Chest 2 View  Result Date: 02/04/2023 CLINICAL DATA:  Bilateral basilar rales EXAM: CHEST - 2 VIEW COMPARISON:  01/27/2023 FINDINGS: Cardiac shadow is enlarged. Aortic calcifications are noted. Lungs are well aerated bilaterally. No focal infiltrate is seen. Small right-sided pleural effusion is noted. No bony abnormality is seen. IMPRESSION: Right-sided pleural effusion.  No other focal abnormality is noted. Electronically Signed   By: Inez Catalina M.D.   On: 02/04/2023 01:00   ECHOCARDIOGRAM COMPLETE  Result Date: 02/02/2023    ECHOCARDIOGRAM REPORT   Patient Name:   Douglas Matthews Date of Exam: 02/02/2023 Medical Rec #:  [id number]        Height:       68.0 in Accession #:    [id number]       Weight:       136.6 lb Date of Birth:  Jul 24, 1935       BSA:          1.738 m Patient Age:    71 years  BP:           126/68 mmHg Patient Gender: M                HR:           116 bpm. Exam Location:  [place] Procedure: 2D Echo, Cardiac Doppler, Color Doppler and 3D Echo Indications:    I50.42 Chronic combined systolic (congestive) and diastolic                 (congestive) heart failure  History:        Patient has prior history of Echocardiogram examinations, most                 recent 10/29/2022. Arrythmias:Atrial Fibrillation, Atrial                 Flutter and LBBB; Risk Factors:Hypertension and Dyslipidemia.  Sonographer:    Diamond Nickel RCS Referring Phys: [place]  1. Left ventricular ejection fraction, by estimation, is 20 to 25%. The left ventricle has severely decreased function. The left ventricle demonstrates global hypokinesis. There is moderate left ventricular hypertrophy. Left ventricular diastolic parameters are indeterminate.  2. Right ventricular systolic function is severely reduced. The right ventricular size is normal. There is  moderately elevated pulmonary artery systolic pressure. The estimated right ventricular systolic pressure is 0000000 mmHg.  3. Left atrial size was mildly dilated.  4. Right atrial size was mildly dilated.  5. A small pericardial effusion is present.  6. The mitral valve is abnormal. Moderate mitral valve regurgitation. Appears functional. No evidence of mitral stenosis.  7. Tricuspid valve regurgitation is moderate to severe.  8. The aortic valve is calcified. There is moderate calcification of the aortic valve. Aortic valve regurgitation is trivial. Aortic valve sclerosis/calcification is present, without any evidence of aortic stenosis.  9. The inferior vena cava is dilated in size with <50% respiratory variability, suggesting right atrial pressure of 15 mmHg. FINDINGS  Left Ventricle: Left ventricular ejection fraction, by estimation, is 20 to 25%. The left ventricle has severely decreased function. The left ventricle demonstrates global hypokinesis. The left ventricular internal cavity size was normal in size. There is moderate left ventricular hypertrophy. Left ventricular diastolic parameters are indeterminate. Right Ventricle: The right ventricular size is normal. No increase in right ventricular wall thickness. Right ventricular systolic function is severely reduced. There is moderately elevated pulmonary artery systolic pressure. The tricuspid regurgitant velocity is 2.97 m/s, and with an assumed right atrial pressure of 15 mmHg, the estimated right ventricular systolic pressure is 0000000 mmHg. Left Atrium: Left atrial size was mildly dilated. Right Atrium: Right atrial size was mildly dilated. Pericardium: A small pericardial effusion is present. Mitral Valve: The mitral valve is abnormal. Moderate mitral valve regurgitation. No evidence of mitral valve stenosis. Tricuspid Valve: The tricuspid valve is normal in structure. Tricuspid valve regurgitation is moderate to severe. Aortic Valve: The aortic valve is  calcified. There is moderate calcification of the aortic valve. Aortic valve regurgitation is trivial. Aortic valve sclerosis/calcification is present, without any evidence of aortic stenosis. Pulmonic Valve: The pulmonic valve was not well visualized. Pulmonic valve regurgitation is mild. Aorta: The aortic root and ascending aorta are structurally normal, with no evidence of dilitation. Venous: The inferior vena cava is dilated in size with less than 50% respiratory variability, suggesting right atrial pressure of 15 mmHg. IAS/Shunts: The interatrial septum was not well visualized.  LEFT VENTRICLE PLAX 2D LVIDd:  4.30 cm   Diastology LVIDs:         3.60 cm   LV e' medial:    4.31 cm/s LV PW:         1.60 cm   LV E/e' medial:  23.1 LV IVS:        1.60 cm   LV e' lateral:   8.10 cm/s LVOT diam:     2.00 cm   LV E/e' lateral: 12.3 LV SV:         24 LV SV Index:   14 LVOT Area:     3.14 cm                           3D Volume EF:                          3D EF:        20 %                          LV EDV:       172 ml                          LV ESV:       138 ml                          LV SV:        34 ml RIGHT VENTRICLE RV Basal diam:  3.20 cm RV S prime:     5.68 cm/s TAPSE (M-mode): 0.7 cm RVSP:           50.3 mmHg LEFT ATRIUM           Index        RIGHT ATRIUM            Index LA diam:      4.90 cm 2.82 cm/m   RA Pressure: 15.00 mmHg LA Vol (A2C): 77.1 ml 44.36 ml/m  RA Area:     19.60 cm LA Vol (A4C): 45.4 ml 26.12 ml/m  RA Volume:   59.50 ml   34.23 ml/m  AORTIC VALVE LVOT Vmax:   72.97 cm/s LVOT Vmean:  43.800 cm/s LVOT VTI:    0.078 m  AORTA Ao Root diam: 3.70 cm Ao Asc diam:  3.50 cm MITRAL VALVE                  TRICUSPID VALVE MV Area (PHT): 6.67 cm       TR Peak grad:   35.3 mmHg MV Decel Time: 114 msec       TR Vmax:        297.00 cm/s MR Peak grad:    83.4 mmHg    Estimated RAP:  15.00 mmHg MR Mean grad:    45.7 mmHg    RVSP:           50.3 mmHg MR Vmax:         456.67 cm/s MR Vmean:         301.3 cm/s   SHUNTS MR PISA:         3.08 cm     Systemic VTI:  0.08 m MR PISA Eff ROA: 26 mm       Systemic Diam: 2.00 cm MR PISA Radius:  0.70 cm MV  E velocity: 99.60 cm/s MV A velocity: 35.37 cm/s MV E/A ratio:  2.82 Oswaldo Milian MD Electronically signed by Oswaldo Milian MD Signature Date/Time: 02/02/2023/7:15:36 PM    Final      Assessment and Plan:   Acute on chronic biventricular heart failure  -Initially diagnosed in the setting of A-fib with RVR, EF in November 2023 was 20 to 25%.  A TEE however showed EF of 30 to 35%.  Recently admitted near the end of February 2024 with dehydration and was given IV fluid.  Returned back to the hospital a few days after discharge due to worsening renal function.  Patient describes shortness of breath with minimal exertion.  -Patient received 2 doses of 60 mg IV Lasix last night, he put out 2.3 L of fluid so far.  On exam, he has minimal residual lower extremity edema, his lungs clear.  He is near euvolemic level.  Unclear cause behind biventricular failure other than atrial fibrillation with RVR in November.  Patient snores but never had a sleep study.  Not sure if he will be candidate for biventricular CRT-D, patient and wife does not appears to be very enthusiastic of any surgical procedure.  Ideally, this patient will need heart failure service evaluation.  Persistent atrial fibrillation: Diagnosed in November along with heart failure and pneumonia.  Placed on 2.5 mg twice a day of Eliquis.  Underwent successful TEE DCCV in November 2023, based on EKG, patient has reverted back to atrial fibrillation on 12/24/2022.  Seen by Dr. Acie Fredrickson in [place] who recommended rate control therapy.  Heart rate in the 80s to low 90s range on 100 mg daily of metoprolol succinate.  Acute on chronic renal insufficiency stage IV: Recently admitted with dehydration and was given IV fluid.  However now patient appears to be volume overloaded and is getting IV  diuresis.  Nephrology service is following.  Hypertension: On metoprolol succinate and Imdur.  Consider reinitiate home hydralazine.  Hyperlipidemia  LBBB   Risk Assessment/Risk Scores:        New York Heart Association (NYHA) Functional Class NYHA Class III  CHA2DS2-VASc Score = 4   This indicates a 4.8% annual risk of stroke. The patient's score is based upon: CHF History: 1 HTN History: 1 Diabetes History: 0 Stroke History: 0 Vascular Disease History: 0 Age Score: 2 Gender Score: 0         For questions or updates, please contact [place] Please consult [email or web address] for contact info under    Signed, Almyra Deforest, [place]  02/05/2023 2:42 PM   Personally seen and examined. Agree with APP above with the following comments:  This is an 88 yo M with HFrEF EF ~ 20-25% RV Failure, LBBB with CKD stage V.  At baseline HTN, HLD with CKD in the 2-3s May 2023.  Found to have new atrial flutter in 2023.  Initialy presentation 10/2022 was DOE bringing the trash to the   He has had multiple readmissions since then: for repeat atrial fibrillation, for hypotension, and AKI.  Has been given fluid given tenuous kidneys.  At his last discharge, for DOE.  He was given lasix as needed.    Represented due to AKI on labs.  Found to have tachypnea and pleural effusion.  Saw nephrology 3/6 who recommended AHF consultation.  General cardiology called instead.  Patient notes that he feels fine as long as he doesn't move.  Wife notes that he has DOE in the room.  NO chest pain.  Wife has noted orthopnea.  He has complained of cold hands and feet.  From their perspective, they feel like he has been doing fine.  Exam notable for JVD and HVR with pronounced V wave; irregular tachycardia; holosystolic murmur, +2 ankle edema; his hands and feet are cool to the touch.  He is tachypneic on exam  Labs notable for Creatinine 3.69; Albumin 3, NA 131, ALT 64; troponin 14-> 15; BNP 1760  (02/03/23) Tele: AF - RVR  Would recommend  He have clinical evidence of relative tachycardia with clinical signs of hypoperfusion; I am worried that he has SCAI B-C Cardiogenic Shock.  Discussed at length my concerns about end stage heart disease and end stage kidney disease.  After this, I offered to check a lactate; reach out to Nephrology to see if HD would be offered and activated the shock team.   We reviewed was AHF therapy would look like: he is not a candidate for durable support device or temporary support device, he would need a central line, IV amiodarone and IV milrinone.  I am unclear what this would be a bridge to.  Case reviewed with Dr. Aundra Dubin who shared the concerns of limited long term benefits.  Discussed with Dr. Lupita Leash and nursing team.  Martin Majestic back to discuss with patient and his wife.  He would not like to pursue this.  We have discussed the risk of this.  We have also discuss that aggressive therapy may not lead to long term exertion of wife.  We will not activate shock team.  Discussed with AHF and TR[facility].  Palliative care consult placed by TR[facility]. I have stopped metoprolol as his tachycardia is compensatory. I have increased his lasix.  Would recommend formal transition to care focused on comfort.  CRITICAL CARE Performed by: Willis Holquin A Ly Bacchi  Total critical care time: 120 minutes. Critical care time was exclusive of separately billable procedures and treating other patients. Critical care was necessary to treat or prevent imminent or life-threatening deterioration. Critical care was time spent personally by me on the following activities: development of treatment plan with patient and/or surrogate as well as nursing, discussions with consultants, evaluation of patient's response to treatment, examination of patient, obtaining history from patient or surrogate, ordering and performing treatments and interventions, ordering and review of laboratory studies, ordering and review  of radiographic studies, pulse oximetry and re-evaluation of patient's condition.    Signed, Rudean Haskell, [place]  02/05/2023 5:35 PM

## 2023-02-05 NOTE — Hospital Course (Addendum)
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 He was recently admitted 2/27 - 3/1 for mild AKI.  Nausea vomiting prerenal insult also had high A-fib burden metoprolol was increased and discharged on PRN Lasix he turns with worsening renal function also with Right-sided pleural effusion/peripheral edema.  Nephrology was consulted and admitted for further management. Patient was found to have acute on chronic biventricular heart failure with early cardiogenic shock beta-blocker/hydralazine discontinued managed with high-dose of IV Lasix seen by cardiology nephrology palliative care.  Not pursuing aggressive measures like invasive procedure, HD, volume looks stable and improved transition to torsemide 3/9, and okay for discharge with close follow-up with outpatient palliative care and nephrology.  Patient doing well on room air feels improved leg edema has improved.

## 2023-02-06 ENCOUNTER — Encounter (HOSPITAL_COMMUNITY): Payer: Medicare Other

## 2023-02-06 DIAGNOSIS — Z66 Do not resuscitate: Secondary | ICD-10-CM

## 2023-02-06 DIAGNOSIS — Z515 Encounter for palliative care: Secondary | ICD-10-CM

## 2023-02-06 DIAGNOSIS — Z7901 Long term (current) use of anticoagulants: Secondary | ICD-10-CM

## 2023-02-06 DIAGNOSIS — N179 Acute kidney failure, unspecified: Secondary | ICD-10-CM

## 2023-02-06 DIAGNOSIS — I10 Essential (primary) hypertension: Secondary | ICD-10-CM

## 2023-02-06 DIAGNOSIS — N189 Chronic kidney disease, unspecified: Secondary | ICD-10-CM

## 2023-02-06 LAB — BASIC METABOLIC PANEL
Anion gap: 13 (ref 5–15)
BUN: 110 mg/dL — ABNORMAL HIGH (ref 8–23)
CO2: 21 mmol/L — ABNORMAL LOW (ref 22–32)
Calcium: 8.6 mg/dL — ABNORMAL LOW (ref 8.9–10.3)
Chloride: 107 mmol/L (ref 98–111)
Creatinine, Ser: 3.45 mg/dL — ABNORMAL HIGH (ref 0.61–1.24)
GFR, Estimated: 17 mL/min — ABNORMAL LOW (ref >60)
Glucose, Bld: 106 mg/dL — ABNORMAL HIGH (ref 70–99)
Potassium: 4 mmol/L (ref 3.5–5.1)
Sodium: 141 mmol/L (ref 135–145)

## 2023-02-06 LAB — CBC
HCT: 33.9 % — ABNORMAL LOW (ref 39.0–52.0)
Hemoglobin: 11.6 g/dL — ABNORMAL LOW (ref 13.0–17.0)
MCH: 29.2 pg (ref 26.0–34.0)
MCHC: 34.2 g/dL (ref 30.0–36.0)
MCV: 85.4 fL (ref 80.0–100.0)
Platelets: 203 10*3/uL (ref 150–400)
RBC: 3.97 MIL/uL — ABNORMAL LOW (ref 4.22–5.81)
RDW: 15.9 % — ABNORMAL HIGH (ref 11.5–15.5)
WBC: 4.8 10*3/uL (ref 4.0–10.5)
nRBC: 0 % (ref 0.0–0.2)

## 2023-02-06 NOTE — Consult Note (Addendum)
Reason for Consult:AKI w/ CKD IV Referring Physician: Fuller Plan, MD  Chief Complaint: AKI w/ CKD IV  Assessment/Plan: AKI w/ CKD IV Patient comes in after recent hospitalization for ARF w/ CKD IV. Patient was d/c w/ Cr 3.04, increased to 4.31 on this admission. Cr improving at 3.45 (3.69) this morning, after diuresis yesterday w/ lasix 60 mg IV x 1, then increased to 120 mg per cardiology. UOP 4.2 L yesterday. Cr elevation on admission thought to be 2/2 to volume overload status of patient, it is likely that his home diuresis was insufficient. Will continue to diurese today and continue to monitor.  -Continue IV lasix 120 mg x BID -AM BMP  HFrEF Echo from 02/02/23 showed EF 20-25% w/ severely decreased LV function and global hypokinesis, RV systolic function severely reduced, w/ moderately elevated PA systolic pressures, w/ small  pericardial effusion. Patient volume status improved, w/ clear lung exam, and 1-2 + pitting edema in LE to level of distal shin. UOP yesterday 4.2 L. Cardiology consulted and feel patient in cardiogenic shock from biventricular HF, recommending palliative care consult, as patient does not want, and is poor candidate for greater intervention. Will continue IV diuresis, given patient's heart function, he is poor candidate for HD, and it is not recommended for this admission or going forward. As patient is not candidate for higher level of intervention, palliative care has been consulted. -Continue IV lasix 120 mg BID  -Strict I/O -K> 4.0 and Mg > 2.0 -Plans per cardiology  Normocytic anemia Hgb 11.6 (12.3) this am w/ baseline being around ~12 last admission. Likely 2/2 to CKD -Plans per primary team HTN BP 0000000 systolic, well controlled.  -Plans per primary team A. Fib HR irregularly irregular this morning, patient currently in A. Fib, but rate controlled to 90's. Patient continued on home Eliquis and d/c metoprolol. Patient HR thought to be compensatory to HF  processes, helping w/ perfusion of distal extremities, and thus metoprolol was countering that. Metoprolol held w/ patient having notable increased warmth to hands. -Plans per primary team   HPI: Douglas Matthews is an 88 y.o. male  with medical history significant of hypertension, hyperlipidemia, HFrEF (last EF 20-25% on 02/02/23), atrial fibrillation/atrial flutter, anemia, CKD stage IV, and BPH, presenting with an abnormal lab work. Patient states that he was seen by his primary care doctor and told him that his creatinine levels were high, and he should be seen in ED. Patient recently admitted 2/27-3/1 for ARF w/ CKD IV, thought to be 2/2 GI losses from gastroenteritis. He was treated w/ IVF with creatinine trending down to 3.04 from 4.13 on discharge. After hospitalization, patient noticed that he gained 3 lbs, had SOB, and his legs started swelling, shortly after d/c.  Patient follows w/ Dr. Marval Regal for nephrology, noting they had spoken about the possibility of HD over a year ago, but it has not come up since.   On admission 3/5, labs were significant for creatinine 4.31, BUN 116, BNP 878. CXR showed right sided pleural effusion. He was started on 60 mg IV Lasix   Chemistry and CBC: Creatinine  Date/Time Value Ref Range Status  04/17/2022 12:00 AM 2.6 (A) 0.6 - 1.3 Final  08/26/2016 12:00 AM 2.4 (A) 0.6 - 1.3 mg/dL Final   Creat  Date/Time Value Ref Range Status  06/13/2020 01:46 PM 2.37 (H) 0.70 - 1.11 mg/dL Final    Comment:    For patients >47 years of age, the reference limit for Creatinine is  approximately 13% higher for people identified as African-American. .    Creatinine, Ser  Date/Time Value Ref Range Status  02/06/2023 05:06 AM 3.45 (H) 0.61 - 1.24 mg/dL Final  02/05/2023 07:35 AM 3.69 (H) 0.61 - 1.24 mg/dL Final  02/04/2023 09:54 AM 4.00 (H) 0.61 - 1.24 mg/dL Final  02/03/2023 07:36 PM 4.31 (H) 0.61 - 1.24 mg/dL Final  02/03/2023 03:01 PM 4.02 (H) 0.40 - 1.50 mg/dL  Final  01/30/2023 04:52 AM 3.04 (H) 0.61 - 1.24 mg/dL Final  01/29/2023 01:54 AM 3.54 (H) 0.61 - 1.24 mg/dL Final  01/28/2023 11:21 AM 3.59 (H) 0.61 - 1.24 mg/dL Final  01/28/2023 02:04 AM 3.84 (H) 0.61 - 1.24 mg/dL Final  01/27/2023 08:38 PM 3.93 (H) 0.61 - 1.24 mg/dL Final  01/27/2023 04:33 PM 4.13 (H) 0.61 - 1.24 mg/dL Final  11/11/2022 11:46 AM 2.71 (H) 0.76 - 1.27 mg/dL Final  11/03/2022 12:24 PM 2.78 (H) 0.76 - 1.27 mg/dL Final  10/30/2022 03:58 AM 2.95 (H) 0.61 - 1.24 mg/dL Final  10/29/2022 03:26 AM 2.75 (H) 0.61 - 1.24 mg/dL Final  10/28/2022 11:56 AM 2.46 (H) 0.61 - 1.24 mg/dL Final  10/27/2022 02:19 AM 2.43 (H) 0.61 - 1.24 mg/dL Final  10/25/2022 06:15 AM 2.15 (H) 0.61 - 1.24 mg/dL Final  10/24/2022 03:46 PM 2.41 (H) 0.61 - 1.24 mg/dL Final  04/29/2019 01:10 PM 2.12 (H) 0.40 - 1.50 mg/dL Final  04/19/2018 09:22 AM 1.93 (H) 0.40 - 1.50 mg/dL Final  04/22/2016 11:08 AM 2.09 (H) 0.40 - 1.50 mg/dL Final  04/26/2015 10:11 AM 2.18 (H) 0.40 - 1.50 mg/dL Final  01/09/2014 10:33 AM 2.2 (H) 0.4 - 1.5 mg/dL Final  12/09/2012 05:32 PM 2.99 (H) 0.50 - 1.35 mg/dL Final  12/29/2011 09:52 AM 2.1 (H) 0.4 - 1.5 mg/dL Final  12/25/2011 12:59 PM 2.8 (H) 0.4 - 1.5 mg/dL Final  12/15/2011 11:37 AM 3.2 (H) 0.4 - 1.5 mg/dL Final  09/19/2010 10:45 AM 2.0 (H) 0.4 - 1.5 mg/dL Final  06/13/2008 02:10 PM 1.7 (H) 0.4 - 1.5 mg/dL Final   Recent Labs  Lab 02/03/23 1501 02/03/23 1936 02/04/23 0954 02/05/23 0735 02/06/23 0506  NA 142 141 141 139 141  K 4.7 5.0 4.7 4.3 4.0  CL 109 110 111 108 107  CO2 20 22 16* 22 21*  GLUCOSE 86 113* 87 112* 106*  BUN 117* 116* 124* 111* 110*  CREATININE 4.02* 4.31* 4.00* 3.69* 3.45*  CALCIUM 8.8 8.7* 8.5* 8.7* 8.6*    Recent Labs  Lab 02/03/23 1936 02/04/23 0954 02/05/23 0735 02/06/23 0506  WBC 5.2 4.6 5.0 4.8  NEUTROABS 3.4  --   --   --   HGB 11.8* 11.7* 12.3* 11.6*  HCT 37.5* 35.5* 36.7* 33.9*  MCV 90.1 89.0 86.8 85.4  PLT 188 168 193 203     Liver Function Tests: Recent Labs  Lab 02/04/23 0954 02/05/23 0735  AST 48* 40  ALT 71* 64*  ALKPHOS 136* 131*  BILITOT 0.9 0.9  PROT 6.1* 6.3*  ALBUMIN 3.0* 3.0*    No results for input(s): "LIPASE", "AMYLASE" in the last 168 hours. No results for input(s): "AMMONIA" in the last 168 hours. Cardiac Enzymes: No results for input(s): "CKTOTAL", "CKMB", "CKMBINDEX", "TROPONINI" in the last 168 hours. Iron Studies: No results for input(s): "IRON", "TIBC", "TRANSFERRIN", "FERRITIN" in the last 72 hours. PT/INR: '@LABRCNTIP'$ (inr:5)  Xrays/Other Studies: ) Results for orders placed or performed during the hospital encounter of 02/03/23 (from the past 48 hour(s))  Comprehensive metabolic panel  Status: Abnormal   Collection Time: 02/04/23  9:54 AM  Result Value Ref Range   Sodium 141 135 - 145 mmol/L   Potassium 4.7 3.5 - 5.1 mmol/L   Chloride 111 98 - 111 mmol/L   CO2 16 (L) 22 - 32 mmol/L   Glucose, Bld 87 70 - 99 mg/dL    Comment: Glucose reference range applies only to samples taken after fasting for at least 8 hours.   BUN 124 (H) 8 - 23 mg/dL   Creatinine, Ser 4.00 (H) 0.61 - 1.24 mg/dL   Calcium 8.5 (L) 8.9 - 10.3 mg/dL   Total Protein 6.1 (L) 6.5 - 8.1 g/dL   Albumin 3.0 (L) 3.5 - 5.0 g/dL   AST 48 (H) 15 - 41 U/L   ALT 71 (H) 0 - 44 U/L   Alkaline Phosphatase 136 (H) 38 - 126 U/L   Total Bilirubin 0.9 0.3 - 1.2 mg/dL   GFR, Estimated 14 (L) >60 mL/min    Comment: (NOTE) Calculated using the CKD-EPI Creatinine Equation (2021)    Anion gap 14 5 - 15    Comment: Performed at [facility], [place] [address], [place], [place] [address]  Magnesium     Status: None   Collection Time: 02/04/23  9:54 AM  Result Value Ref Range   Magnesium 2.4 1.7 - 2.4 mg/dL    Comment: Performed at [facility] [address], [place], [place] [address]  CBC     Status: Abnormal   Collection Time: 02/04/23  9:54 AM  Result Value Ref Range   WBC 4.6 4.0 - 10.5 K/uL    RBC 3.99 (L) 4.22 - 5.81 MIL/uL   Hemoglobin 11.7 (L) 13.0 - 17.0 g/dL   HCT 35.5 (L) 39.0 - 52.0 %   MCV 89.0 80.0 - 100.0 fL   MCH 29.3 26.0 - 34.0 pg   MCHC 33.0 30.0 - 36.0 g/dL   RDW 16.4 (H) 11.5 - 15.5 %   Platelets 168 150 - 400 K/uL   nRBC 0.0 0.0 - 0.2 %    Comment: Performed at [facility], [place] [address] [place], [place] [address]  Comprehensive metabolic panel     Status: Abnormal   Collection Time: 02/05/23  7:35 AM  Result Value Ref Range   Sodium 139 135 - 145 mmol/L   Potassium 4.3 3.5 - 5.1 mmol/L   Chloride 108 98 - 111 mmol/L   CO2 22 22 - 32 mmol/L   Glucose, Bld 112 (H) 70 - 99 mg/dL    Comment: Glucose reference range applies only to samples taken after fasting for at least 8 hours.   BUN 111 (H) 8 - 23 mg/dL   Creatinine, Ser 3.69 (H) 0.61 - 1.24 mg/dL   Calcium 8.7 (L) 8.9 - 10.3 mg/dL   Total Protein 6.3 (L) 6.5 - 8.1 g/dL   Albumin 3.0 (L) 3.5 - 5.0 g/dL   AST 40 15 - 41 U/L   ALT 64 (H) 0 - 44 U/L   Alkaline Phosphatase 131 (H) 38 - 126 U/L   Total Bilirubin 0.9 0.3 - 1.2 mg/dL   GFR, Estimated 15 (L) >60 mL/min    Comment: (NOTE) Calculated using the CKD-EPI Creatinine Equation (2021)    Anion gap 9 5 - 15    Comment: Performed at [facility], [place] [address] [place], [place] [address]  CBC     Status: Abnormal   Collection Time:  02/05/23  7:35 AM  Result Value Ref Range   WBC 5.0 4.0 - 10.5 K/uL   RBC 4.23 4.22 - 5.81 MIL/uL   Hemoglobin 12.3 (L) 13.0 - 17.0 g/dL   HCT 36.7 (L) 39.0 - 52.0 %   MCV 86.8 80.0 - 100.0 fL   MCH 29.1 26.0 - 34.0 pg   MCHC 33.5 30.0 - 36.0 g/dL   RDW 16.2 (H) 11.5 - 15.5 %   Platelets 193 150 - 400 K/uL   nRBC 0.0 0.0 - 0.2 %    Comment: Performed at [facility] [address], [place], [place] [address]  Lactic acid, plasma     Status: None   Collection Time: 02/05/23  4:48 PM  Result Value Ref Range   Lactic Acid, Venous 1.3 0.5 - 1.9 mmol/L    Comment: Performed at [facility] [address], [place], [place] [id number]  Basic metabolic panel     Status: Abnormal   Collection Time: 02/06/23  5:06 AM  Result Value Ref Range   Sodium 141 135 - 145 mmol/L   Potassium 4.0 3.5 - 5.1 mmol/L   Chloride 107 98 - 111 mmol/L   CO2 21 (L) 22 - 32 mmol/L   Glucose, Bld 106 (H) 70 - 99 mg/dL    Comment: Glucose reference range applies only to samples taken after fasting for at least 8 hours.   BUN 110 (H) 8 - 23 mg/dL   Creatinine, Ser 3.45 (H) 0.61 - 1.24 mg/dL   Calcium 8.6 (L) 8.9 - 10.3 mg/dL   GFR, Estimated 17 (L) >60 mL/min    Comment: (NOTE) Calculated using the CKD-EPI Creatinine Equation (2021)    Anion gap 13 5 - 15    Comment: Performed at [facility] [address] [place], [place] [address]  CBC     Status: Abnormal   Collection Time: 02/06/23  5:06 AM  Result Value Ref Range   WBC 4.8 4.0 - 10.5 K/uL   RBC 3.97 (L) 4.22 - 5.81 MIL/uL   Hemoglobin 11.6 (L) 13.0 - 17.0 g/dL   HCT 33.9 (L) 39.0 - 52.0 %   MCV 85.4 80.0 - 100.0 fL   MCH 29.2 26.0 - 34.0 pg   MCHC 34.2 30.0 - 36.0 g/dL   RDW 15.9 (H) 11.5 - 15.5 %   Platelets 203 150 - 400 K/uL   nRBC 0.0 0.0 - 0.2 %    Comment: Performed at [facility], [place] [address], [place], [place] [address]   No results found.  PMH:   Past Medical History:  Diagnosis Date   Anemia    Atrial fibrillation (HCC)    BPH (benign prostatic hypertrophy)    Chronic kidney disease    Diverticulosis    Hyperlipidemia    Hypertension     PSH:   Past Surgical History:  Procedure Laterality Date   APPENDECTOMY     CARDIOVERSION N/A 10/29/2022   Procedure: CARDIOVERSION;  Surgeon: Lelon Perla, MD;  Location: [facility] ENDOSCOPY;  Service: Cardiovascular;  Laterality: N/A;   HERNIA REPAIR     TEE WITHOUT CARDIOVERSION N/A 10/29/2022   Procedure: TRANSESOPHAGEAL ECHOCARDIOGRAM (TEE);  Surgeon: Lelon Perla, MD;  Location: [facility] ENDOSCOPY;  Service: Cardiovascular;  Laterality: N/A;     Allergies: No Known Allergies  Medications:   Prior to Admission medications   Medication Sig Start Date End Date Taking? Authorizing Provider  apixaban (ELIQUIS) 2.5  MG TABS tablet Take 1 tablet (2.5 mg total) by mouth 2 (two) times daily. 11/21/22  Yes Nahser, Wonda Cheng, MD  COD LIVER OIL PO Take 1 tablet by mouth daily.   Yes [provider]  furosemide (LASIX) 40 MG tablet Take 1 tablet (40 mg total) by mouth daily as needed for edema or fluid (as needed in case of weight gain 2 to 3 lbs in 24 hrs or 5 lbs in 7 days, leg swelling or shortness of breath.). 11/21/22  Yes Nahser, Wonda Cheng, MD  GARLIC PO Take 1 tablet by mouth daily.   Yes [provider]  hydrALAZINE (APRESOLINE) 10 MG tablet Take 1 tablet (10 mg total) by mouth 3 (three) times daily. 11/21/22  Yes Nahser, Wonda Cheng, MD  metoprolol succinate (TOPROL XL) 100 MG 24 hr tablet Take 1 tablet (100 mg total) by mouth daily. 01/31/23  Yes Annita Brod, MD  Multiple Vitamin (MULTIVITAMIN) capsule Take 1 capsule by mouth daily.   Yes [provider]  Saw Palmetto 450 MG CAPS Take 900 mg by mouth daily.   Yes [provider]  isosorbide mononitrate (IMDUR) 30 MG 24 hr tablet Take 1 tablet (30 mg total) by mouth daily. 11/21/22   Nahser, Wonda Cheng, MD  ondansetron (ZOFRAN) 4 MG tablet Take 0.5-1 tablets (2-4 mg total) by mouth every 12 (twelve) hours as needed for nausea or vomiting. Patient not taking: Reported on 02/04/2023 02/03/23   Martin[place], [name], MD    Discontinued Meds:   Medications Discontinued During This Encounter  Medication Reason   multivitamin capsule 1 capsule Not available   furosemide (LASIX) injection 60 mg    metoprolol succinate (TOPROL-XL) 24 hr tablet 100 mg    furosemide (LASIX) injection 60 mg     Social History:  reports that he has never smoked. He has never used smokeless tobacco. He reports that he does not drink alcohol and does not use drugs.  Family History:    Family History  Problem Relation Age of Onset   Heart disease Other     Blood pressure (!) 124/96, pulse 100, temperature 97.7 F (36.5 C), temperature source Oral, resp. rate 19, height '5\' 8"'$  (1.727 m), weight 60.1 kg, SpO2 99 %. General appearance: alert, cooperative, appears stated age, and no distress Resp: CTABL, no crackles, wheezes or rhonci Cardio: irregularly irregular rhythm, S1, S2 normal, no S3 or S4, no click, no rub, and no murmur GI: soft, non-tender; bowel sounds normal; no masses,  no organomegaly Extremities: edema 1-2+ pitting edema to level of distal shin       Holley Bouche, MD 02/06/2023, 7:29 AM

## 2023-02-06 NOTE — Progress Notes (Signed)
PROGRESS NOTE Douglas Matthews  [id number] DOB: [birth year] DOA: 02/03/2023 PCP: Martinique, [name], MD  Brief Narrative/Hospital Course: 34 old male with history of hypertension, hyperlipidemia, A-fib, CKD-IV/V ~ creat 2.7, chronic HFrEF (EF 20 to 25%), seen by primary care provider for evaluation of elevated renal function/AKI . He was recently admitted 2/27 - 3/1 for mild AKI.  Nausea vomiting prerenal insult also had high A-fib burden metoprolol was increased and discharged on PRN Lasix he turns with worsening renal function also with Right-sided pleural effusion/peripheral edema.  Nephrology was consulted and admitted for further management     Subjective: Seen and examined this morning appears alert awake oriented, wife at the bedside, nephrology cardiology team at the bedside.    Assessment and Plan: Active Problems:   Heart failure with reduced ejection fraction (HCC)   Acute kidney injury superimposed on chronic kidney disease (HCC)   Nausea   Normocytic anemia   HTN (hypertension)   Persistent atrial fibrillation (HCC)   Chronic anticoagulation   AKI on CKD 4/5 Metabolic acidosis Uremia with BUN  >100: Patient discharge creatinine was 3.0 on admission 4.3, appreciate nephrology input having AKI with decompensated CHF. Nephrology cardiology actively involved getting diuresis creatinine seems to be improving monitor urine output strictly. Recent Labs    01/27/23 2038 01/28/23 0204 01/28/23 1121 01/29/23 0154 01/30/23 0452 02/03/23 1501 02/03/23 1936 02/04/23 0954 02/05/23 0735 02/06/23 0506  BUN 97* 101* 96* 92* 80* 117* 116* 124* 111* 110*  CREATININE 3.93* 3.84* 3.59* 3.54* 3.04* 4.02* 4.31* 4.00* 3.69* 3.45*    Acute on chronic biventricular heart failure Early cardiogenic shock: Likely will not tolerate HD, managing conservatively with aggressive diuresis, beta-blocker discontinued, cannot tolerate GDMT, cardiology following closely palliative care consulted  continue Imdur, Lasix as per cardiology and nephrology.Monitor strict I/O,daly weight, electrolytes, Cont salt and fluid restricted diet.Net IO Since Admission: -5,035 mL [02/06/23 1016]  Filed Weights   02/03/23 1921 02/05/23 0701 02/06/23 0430  Weight: 68.2 kg 62.8 kg 60.1 kg    Nausea: Improved-suspect uremic symptom, Cont antiemetics as needed Normocytic anemia: hb stable, monitor  Recent Labs  Lab 02/03/23 1501 02/03/23 1936 02/04/23 0954 02/05/23 0735 02/06/23 0506  HGB 12.0* 11.8* 11.7* 12.3* 11.6*  HCT 36.7* 37.5* 35.5* 36.7* 33.9*    Essential hypertension: BP stable.beta-blocker discontinued to allow diuresis  Persistent atrial fibrillation on chronic anticoagulation: Continue Eliquis.  Not planning to use beta-blocker in the setting of reduced EF given A-fib is compensatory.  Goals Of care: Prognosis appears poor, palliative care involved patient with acute on chronic heart failure with advanced CKD, changing to DNR, managing conservatively.  DVT prophylaxis: apixaban (ELIQUIS) tablet 2.5 mg Start: 02/04/23 1000 Code Status:   Code Status: DNR Family Communication: plan of care discussed with patient and wife at bedside. Patient status is: Inpatient because of renal failure and heart failure Level of care: Telemetry Medical   Dispo: The patient is from: home w/ wife            Anticipated disposition: home ~ 2-3 days Objective: Vitals last 24 hrs: Vitals:   02/05/23 1540 02/05/23 2038 02/06/23 0430 02/06/23 0756  BP: (!) 128/100 (!) 121/98 (!) 124/96 (!) 107/92  Pulse: 90 100 100 (!) 109  Resp: 16  19   Temp: 97.6 F (36.4 C) 97.6 F (36.4 C) 97.7 F (36.5 C) 97.8 F (36.6 C)  TempSrc: Oral Oral Oral Oral  SpO2: 100% 100% 99% 99%  Weight:   60.1 kg  Height:       Weight change:   Physical Examination: General exam: alert awake, oriented, older than stated age HEENT:Oral mucosa moist, Ear/Nose WNL grossly Respiratory system: Bilaterally diminished BS,no  use of accessory muscle Cardiovascular system: S1 & S2 +, JVD +. Gastrointestinal system: Abdomen soft,NT,ND, BS+ Nervous System: Alert, awake, moving extremities,he follows commands. Extremities: LE edema +,distal peripheral pulses palpable.  Skin: No rashes,no icterus. MSK: thin muscle bulk,tone, power   Medications reviewed:  Scheduled Meds:  apixaban  2.5 mg Oral BID   isosorbide mononitrate  30 mg Oral Daily   multivitamin with minerals  1 tablet Oral Daily   sodium chloride flush  3 mL Intravenous Q12H   Continuous Infusions:  furosemide 120 mg (02/05/23 1842)     Diet Order             Diet Heart Room service appropriate? Yes; Fluid consistency: Thin; Fluid restriction: 1200 mL Fluid  Diet effective now                  Intake/Output Summary (Last 24 hours) at 02/06/2023 1016 Last data filed at 02/06/2023 0855 Gross per 24 hour  Intake 770 ml  Output 3700 ml  Net -2930 ml    Net IO Since Admission: -5,035 mL [02/06/23 1016]  Wt Readings from Last 3 Encounters:  02/06/23 60.1 kg  02/03/23 68.2 kg  01/27/23 62 kg     Unresulted Labs (From admission, onward)     Start     Ordered   02/06/23 [id number]  Basic metabolic panel  Daily,   R      02/05/23 1030   02/06/23 0500  CBC  Daily,   R      02/05/23 1030          Data Reviewed: I have personally reviewed following labs and imaging studies CBC: Recent Labs  Lab 02/03/23 1501 02/03/23 1936 02/04/23 0954 02/05/23 0735 02/06/23 0506  WBC 5.1 5.2 4.6 5.0 4.8  NEUTROABS  --  3.4  --   --   --   HGB 12.0* 11.8* 11.7* 12.3* 11.6*  HCT 36.7* 37.5* 35.5* 36.7* 33.9*  MCV 88.1 90.1 89.0 86.8 85.4  PLT 175.0 188 168 193 [id number]    Basic Metabolic Panel: Recent Labs  Lab 02/03/23 1501 02/03/23 1936 02/04/23 0954 02/05/23 0735 02/06/23 0506  NA 142 141 141 139 141  K 4.7 5.0 4.7 4.3 4.0  CL 109 110 111 108 107  CO2 20 22 16* 22 21*  GLUCOSE 86 113* 87 112* 106*  BUN 117* 116* 124* 111* 110*   CREATININE 4.02* 4.31* 4.00* 3.69* 3.45*  CALCIUM 8.8 8.7* 8.5* 8.7* 8.6*  MG  --   --  2.4  --   --     GFR: Estimated Creatinine Clearance: 13.3 mL/min (A) (by C-G formula based on SCr of 3.45 mg/dL (H)). Liver Function Tests: Recent Labs  Lab 02/04/23 0954 02/05/23 0735  AST 48* 40  ALT 71* 64*  ALKPHOS 136* 131*  BILITOT 0.9 0.9  PROT 6.1* 6.3*  ALBUMIN 3.0* 3.0*    Recent Labs    02/03/23 1501  PROBNP 1,760.0*    Recent Results (from the past 240 hour(s))  Resp panel by RT-PCR (RSV, Flu A&B, Covid) Anterior Nasal Swab     Status: None   Collection Time: 01/27/23  7:24 PM   Specimen: Anterior Nasal Swab  Result Value Ref Range Status   SARS Coronavirus 2 by RT PCR  NEGATIVE NEGATIVE Final    Comment: (NOTE) SARS-CoV-2 target nucleic acids are NOT DETECTED.  The SARS-CoV-2 RNA is generally detectable in upper respiratory specimens during the acute phase of infection. The lowest concentration of SARS-CoV-2 viral copies this assay can detect is 138 copies/mL. A negative result does not preclude SARS-Cov-2 infection and should not be used as the sole basis for treatment or other patient management decisions. A negative result may occur with  improper specimen collection/handling, submission of specimen other than nasopharyngeal swab, presence of viral mutation(s) within the areas targeted by this assay, and inadequate number of viral copies(<138 copies/mL). A negative result must be combined with clinical observations, patient history, and epidemiological information. The expected result is Negative.  Fact Sheet for Patients:  EntrepreneurPulse.com.au  Fact Sheet for Healthcare Providers:  IncredibleEmployment.be  This test is no t yet approved or cleared by the [place] FDA and  has been authorized for detection and/or diagnosis of SARS-CoV-2 by FDA under an Emergency Use Authorization (EUA). This EUA will remain  in  effect (meaning this test can be used) for the duration of the COVID-19 declaration under Section 564(b)(1) of the Act, 21 U.S.C.section 360bbb-3(b)(1), unless the authorization is terminated  or revoked sooner.       Influenza A by PCR NEGATIVE NEGATIVE Final   Influenza B by PCR NEGATIVE NEGATIVE Final    Comment: (NOTE) The Xpert Xpress SARS-CoV-2/FLU/RSV plus assay is intended as an aid in the diagnosis of influenza from Nasopharyngeal swab specimens and should not be used as a sole basis for treatment. Nasal washings and aspirates are unacceptable for Xpert Xpress SARS-CoV-2/FLU/RSV testing.  Fact Sheet for Patients: EntrepreneurPulse.com.au  Fact Sheet for Healthcare Providers: IncredibleEmployment.be  This test is not yet approved or cleared by the [place] FDA and has been authorized for detection and/or diagnosis of SARS-CoV-2 by FDA under an Emergency Use Authorization (EUA). This EUA will remain in effect (meaning this test can be used) for the duration of the COVID-19 declaration under Section 564(b)(1) of the Act, 21 U.S.C. section 360bbb-3(b)(1), unless the authorization is terminated or revoked.     Resp Syncytial Virus by PCR NEGATIVE NEGATIVE Final    Comment: (NOTE) Fact Sheet for Patients: EntrepreneurPulse.com.au  Fact Sheet for Healthcare Providers: IncredibleEmployment.be  This test is not yet approved or cleared by the [place] FDA and has been authorized for detection and/or diagnosis of SARS-CoV-2 by FDA under an Emergency Use Authorization (EUA). This EUA will remain in effect (meaning this test can be used) for the duration of the COVID-19 declaration under Section 564(b)(1) of the Act, 21 U.S.C. section 360bbb-3(b)(1), unless the authorization is terminated or revoked.  Performed at [facility], [address] [place], [place] [address]      Antimicrobials: Anti-infectives (From admission, onward)    None      Culture/Microbiology    Component Value Date/Time   SDES  10/24/2022 1658    BLOOD RIGHT FOREARM Performed at [facility] [address] [place], [place] [address]    [facility]  10/24/2022 1658    BOTTLES DRAWN AEROBIC AND ANAEROBIC Blood Culture adequate volume Performed at [facility], [address], [place], [place] [address]    CULT (A) 10/24/2022 1658    PROPIONIBACTERIUM ACNES Standardized susceptibility testing for this organism is not available. Performed at [facility], [place] [address] [place], [place] [address]    REPTSTATUS 10/31/2022 FINAL 10/24/2022 1658  Radiology Studies: No  results found.   LOS: 2 days   Antonieta Pert, MD [facility]  02/06/2023, 10:16 AM

## 2023-02-06 NOTE — Progress Notes (Signed)
Progress Note  Patient Name: Douglas Matthews Date of Encounter: 02/06/2023  Primary Cardiologist: Mertie Moores, MD   Subjective   Overnight, off BB and with high dose diuretics hand and feet are much warmer. Patient notes that he feels better  Inpatient Medications    Scheduled Meds:  apixaban  2.5 mg Oral BID   isosorbide mononitrate  30 mg Oral Daily   multivitamin with minerals  1 tablet Oral Daily   sodium chloride flush  3 mL Intravenous Q12H   Continuous Infusions:  furosemide 120 mg (02/05/23 1842)   PRN Meds: acetaminophen **OR** acetaminophen, albuterol   Vital Signs    Vitals:   02/05/23 0820 02/05/23 1540 02/05/23 2038 02/06/23 0430  BP: 117/85 (!) 128/100 (!) 121/98 (!) 124/96  Pulse: 100 90 100 100  Resp: '16 16  19  '$ Temp: (!) 97.5 F (36.4 C) 97.6 F (36.4 C) 97.6 F (36.4 C) 97.7 F (36.5 C)  TempSrc: Oral Oral Oral Oral  SpO2: 99% 100% 100% 99%  Weight:    60.1 kg  Height:        Intake/Output Summary (Last 24 hours) at 02/06/2023 0730 Last data filed at 02/06/2023 [id number] Gross per 24 hour  Intake 1130 ml  Output 4200 ml  Net -3070 ml   Filed Weights   02/03/23 1921 02/05/23 0701 02/06/23 0430  Weight: 68.2 kg 62.8 kg 60.1 kg    Telemetry    AF RVR - Personally Reviewed  Physical Exam   Gen: no distress, thin male   Neck: JVD into mandible Cardiac: No Rubs or Gallops, holosystolic murmur, IRIR +2 radial pulses Respiratory: Clear to auscultation bilaterally, normal effort, normal  respiratory rate GI: Soft, nontender, non-distended  MS: +2 ankle edema;  moves all extremities Integument: Skin feels warm in the hands and feet Neuro:  At time of evaluation, alert and oriented to person/place/time/situation  Psych: Normal affect, patient feels better   Labs    Chemistry Recent Labs  Lab 02/04/23 0954 02/05/23 0735 02/06/23 0506  NA 141 139 141  K 4.7 4.3 4.0  CL 111 108 107  CO2 16* 22 21*  GLUCOSE 87 112* 106*  BUN 124*  111* 110*  CREATININE 4.00* 3.69* 3.45*  CALCIUM 8.5* 8.7* 8.6*  PROT 6.1* 6.3*  --   ALBUMIN 3.0* 3.0*  --   AST 48* 40  --   ALT 71* 64*  --   ALKPHOS 136* 131*  --   BILITOT 0.9 0.9  --   GFRNONAA 14* 15* 17*  ANIONGAP '14 9 13     '$ Hematology Recent Labs  Lab 02/04/23 0954 02/05/23 0735 02/06/23 0506  WBC 4.6 5.0 4.8  RBC 3.99* 4.23 3.97*  HGB 11.7* 12.3* 11.6*  HCT 35.5* 36.7* 33.9*  MCV 89.0 86.8 85.4  MCH 29.3 29.1 29.2  MCHC 33.0 33.5 34.2  RDW 16.4* 16.2* 15.9*  PLT 168 193 203    Cardiac EnzymesNo results for input(s): "TROPONINI" in the last 168 hours. No results for input(s): "TROPIPOC" in the last 168 hours.   BNP Recent Labs  Lab 02/03/23 1501 02/03/23 1936  BNP  --  878.0*  PROBNP 1,760.0*  --      DDimer No results for input(s): "DDIMER" in the last 168 hours.   Radiology    No results found.   Patient Profile     88 y.o. male with SCAI B Classification cardiogenic shock and CKD Stage V  Assessment & Plan  Acute on chronic biventricular heart failure CDK stage V HTN and HLD LBBB - NYHA III, Stage C, SCAI B classification, hypervolemic - he has end stand heart disease and significant kidney disease; I am concern that treated end stage heart disease would put him into renal failure; I am not sure he would tolerate HD - cannot tolerate GDMT; would not start BB as his heart rate is compensatory - continue high dose IV lasix 120 IV BID - continue IMDUR - discussed in the room with patient, wife, Nephrology (resident) and [facility] attending; we will not presently plan for invasive therapies - Would recommend GOC discussion (We had started these discussions presently)  Persistent atrial fibrillation - compensatory from heart failure - doing better off beta blocker - continue Eliquis unless GOC change      For questions or updates, please contact [facility] Please consult [email or web address] for contact info under  Cardiology/STEMI.      Rudean Haskell, MD [place], [address] [place], [place] [address] [phone]  7:30 AM

## 2023-02-06 NOTE — Progress Notes (Signed)
Mobility Specialist Progress Note   02/06/23 1419  Mobility  Activity Ambulated independently in hallway  Level of Assistance Independent after set-up  Assistive Device None  Distance Ambulated (ft) 410 ft  Activity Response Tolerated well  Mobility Referral No (Has no mobility needs)  $Mobility charge 1 Mobility   Patient received in bed agreeable to participate in mobility. Ambulated in hallway independently with steady gait, asymptomatic throughout. Returned to room without complaint or incident. Was left back in bed with all needs met, call bell in reach.   Holland Falling Mobility Specialist Please contact via SecureChat or  Rehab office at [phone]

## 2023-02-06 NOTE — Progress Notes (Cosign Needed)
  Transition of Care [facility]) Screening Note   Patient Details  Name: Douglas Matthews Date of Birth: 1935-02-14   Transition of Care [facility]) CM/SW Contact:    Tom-Johnson, Renea Ee, RN Phone Number: 02/06/2023, 4:30 PM  Patient is admitted for AKI on CKD 4. Patient has extensive cardiac hx with EF of 20-25%. Nephrology and inpatient Palliative care following.  From home with wife, has two supportive children. Active with Home Health PT/RN disciplines with [facility].  PCP is Martinique, [name], MD and uses [facility] on [address].   Transition of Care Department [facility]) has reviewed patient and no TOC needs or recommendations have been identified at this time. TOC will continue to monitor patient advancement through interdisciplinary progression rounds. If new patient transition needs arise, please place a TOC consult.

## 2023-02-06 NOTE — Progress Notes (Signed)
Heart Failure Navigator Progress Note  Assessed for Heart & Vascular TOC clinic readiness.  Patient does not meet criteria due to going home with hospice, EF 20-25% , CKD IV.   Navigator will sign off at this time.   Earnestine Leys, BSN, Clinical cytogeneticist Only

## 2023-02-06 NOTE — Consult Note (Signed)
Palliative Medicine Inpatient Consult Note  Consulting Provider:  Antonieta Pert, MD   Reason for consult:  [place] for Consult?GOC. pt is in early cardiogenic shock with CKD.  02/06/2023  HPI:  Per intake H&P --> Douglas Matthews is a 88 y.o. male with medical history significant of hypertension, hyperlipidemia, HFrEF (last EF 20-25% on 02/02/23), atrial fibrillation/atrial flutter, anemia, CKD stage IV, and BPH.  Douglas Matthews has been admitted to [facility] in the setting of worsening kidney function.   Palliative care has been consulted due to multiple re-admissions, chronic co-morbidities, and overall declining health to further address goals of care.   Clinical Assessment/Goals of Care:  *Please note that this is a verbal dictation therefore any spelling or grammatical errors are due to the "[place] One" system interpretation.  I have reviewed medical records including EPIC notes, labs and imaging, received report from bedside RN, assessed the patient who is lying in bed in no acute distress.    I met with [name] and his spouse, [name] to further discuss diagnosis prognosis, GOC, EOL wishes, disposition and options.   I introduced Palliative Medicine as specialized medical care for people living with serious illness. It focuses on providing relief from the symptoms and stress of a serious illness. The goal is to improve quality of life for both the patient and the family.  Medical History Review and Understanding:  [name] and I reviewed his past medical history of hypertension, hyperlipidemia, biventricular heart failure, atrial fibrillation, anemia, stage IV chronic kidney disease, and BPH.  Social History:  Douglas Matthews shares with me that he is from [place] near [place].  He and his wife [name] have been married for 70 years though together for 60 years.  They share 2 sons and 3 grandchildren.  Makhi worked throughout  his career at the [employer].  Shivan enjoys doing yard work as well as servicing on his cars.  He enjoys sports and being social.  He is a man of faith and practices at [employer].  Demarrion's wife is a Arboriculturist.  Functional and Nutritional State:  Prior to Douglas Matthews's recent hospitalization he was fully independent of all B ADLs and IADLs.  He has not required any assistive devices for mobility purposes.  [name] for a period of time had a poor appetite a few weeks ago in the setting of a GI bug though per he and his wife his appetite has picked up.  He ate about 75% of his breakfast this morning.  Advance Directives:  A detailed discussion was had today regarding advanced directives.  Both [name] and his wife recently went to an advance care planning class.  They have the advance directives and MOST form at home which they plan to look over after [facility] discharges and complete with their primary care physician, Dr. Martinique.  Code Status:  Concepts specific to code status, artifical feeding and hydration, continued IV antibiotics and rehospitalization was had.  The difference between a aggressive medical intervention path  and a palliative comfort care path for this patient at this time was had.   Encouraged patient/family to consider DNR/DNI status understanding evidenced based poor outcomes in similar hospitalized patient, as the cause of arrest is likely associated with advanced chronic/terminal illness rather than an easily reversible acute cardio-pulmonary event. I explained that DNR/DNI does not change the medical plan and it only comes into effect after a person has arrested (died).  It is  a protective measure to keep [place] from harming the patient in their last moments of life.  [name] was agreeable to DNR/DNI with understanding that patient would not receive CPR, defibrillation, ACLS medications, or intubation.   Discussion:  Open and honest conversations were held in the  setting of Douglas Matthews heart failure, recent hospitalizations, and kidney disease.  We reviewed his prior hospitalization which was thought to be related to dehydration from a GI bug.  We discussed that presently he is thought to be volume overloaded due to his heart failure which is having a direct effect on his kidneys.  We discussed the plan for diuresis and the gradual improvement which are being seen.  We further discussed if patient were to not improve if he would ever want to consider interventions such as hemodialysis.  This is something that at this point in time he would veer away from and be less inclined to think about.  His wife endorses that she is a friend on it who functions well though Douglas Matthews does not seem enthused about the idea of dialysis.  We did talk about the idea if he ever wanted to pursue it and it was needed that the nephrologist would sit down and have a detailed conversation about the risks and benefits of such interventions.  A review of patient's recurrent hospitalizations was had.  I did share openly and honestly if we see this is a trend that often is indicative of Douglas Matthews's body and the functions of his body and when it can and cannot do.  We did talk if he were to continue to be recurrently hospitalized further discussing hospice care.  At this point in time Douglas Matthews feels he has a good quality of life and would very much like to get back to a point where he can remain independent and social with his friends and family.  Discussed the importance of continued conversation with family and their  medical providers regarding overall plan of care and treatment options, ensuring decisions are within the context of the patients values and GOCs.  Decision Maker: Douglas Matthews is able to make decisions for himself though if he were incapacitated his wife Douglas Matthews would be his surrogate decision maker.  SUMMARY OF RECOMMENDATIONS   DNAR/DNI  At this point in time Douglas Matthews is not inclined  to desire dialysis  Continue to treat what is treatable  Goals are for improvement in recovery to a level of independence  Both [name] and his wife plan to complete advanced directives after discharge  Ongoing palliative care support  Code Status/Advance Care Planning: DNAR/DNI  Palliative Prophylaxis:  Aspiration, Bowel Regimen, Delirium Protocol, Frequent Pain Assessment, Oral Care, Palliative Wound Care, and Turn Reposition  Additional Recommendations (Limitations, Scope, Preferences): Continue current care  Psycho-social/Spiritual:  Desire for further Chaplaincy support: Not presently Additional Recommendations: Education on heart failure and its effect on kidney function   Prognosis: Multiple chronic comorbidities and rehospitalization's place patient at a high 78-monthmortality risk  Discharge Planning: Discharge home once medically optimized  Vitals:   02/05/23 2038 02/06/23 0430  BP: (!) 121/98 (!) 124/96  Pulse: 100 100  Resp:  19  Temp: 97.6 F (36.4 C) 97.7 F (36.5 C)  SpO2: 100% 99%    Intake/Output Summary (Last 24 hours) at 02/06/2023 0711 Last data filed at 02/06/2023 0436 Gross per 24 hour  Intake 1130 ml  Output 3700 ml  Net -2570 ml   Last Weight  Most recent update: 02/06/2023  4:36 AM  Weight  60.1 kg (132 lb 7.9 oz)            Gen: Elderly African-American male in no acute distress HEENT: moist mucous membranes CV: Regular rate and irregular rhythm PULM: On room air breathing is even and nonlabored ABD: soft/nontender EXT: No edema Neuro: Alert and oriented x3  PPS: 60%   This conversation/these recommendations were discussed with patient primary care team, Dr. Lupita Leash  Billing based on MDM: High ______________________________________________________ [place] Team Team Cell Phone: [phone] Please utilize secure chat with additional questions, if there is no response within 30 minutes  please call the above phone number  Palliative Medicine Team providers are available by phone from 7am to 7pm daily and can be reached through the team cell phone.  Should this patient require assistance outside of these hours, please call the patient's attending physician.

## 2023-02-06 NOTE — Care Management Important Message (Signed)
Important Message  Patient Details  Name: Douglas Matthews MRN: [id number] Date of Birth: 1935-01-30   Medicare Important Message Given:  Yes     Shelda Altes 02/06/2023, 3:40 PM

## 2023-02-07 DIAGNOSIS — Z7189 Other specified counseling: Secondary | ICD-10-CM

## 2023-02-07 DIAGNOSIS — Z515 Encounter for palliative care: Secondary | ICD-10-CM | POA: Diagnosis not present

## 2023-02-07 LAB — BASIC METABOLIC PANEL
Anion gap: 11 (ref 5–15)
BUN: 110 mg/dL — ABNORMAL HIGH (ref 8–23)
CO2: 24 mmol/L (ref 22–32)
Calcium: 8.7 mg/dL — ABNORMAL LOW (ref 8.9–10.3)
Chloride: 102 mmol/L (ref 98–111)
Creatinine, Ser: 3.65 mg/dL — ABNORMAL HIGH (ref 0.61–1.24)
GFR, Estimated: 16 mL/min — ABNORMAL LOW (ref >60)
Glucose, Bld: 133 mg/dL — ABNORMAL HIGH (ref 70–99)
Potassium: 4.1 mmol/L (ref 3.5–5.1)
Sodium: 137 mmol/L (ref 135–145)

## 2023-02-07 LAB — CBC
HCT: 35.7 % — ABNORMAL LOW (ref 39.0–52.0)
Hemoglobin: 11.5 g/dL — ABNORMAL LOW (ref 13.0–17.0)
MCH: 28.2 pg (ref 26.0–34.0)
MCHC: 32.2 g/dL (ref 30.0–36.0)
MCV: 87.5 fL (ref 80.0–100.0)
Platelets: 215 10*3/uL (ref 150–400)
RBC: 4.08 MIL/uL — ABNORMAL LOW (ref 4.22–5.81)
RDW: 16 % — ABNORMAL HIGH (ref 11.5–15.5)
WBC: 4.7 10*3/uL (ref 4.0–10.5)
nRBC: 0 % (ref 0.0–0.2)

## 2023-02-07 MED ORDER — TORSEMIDE 60 MG PO TABS: 60.0000 mg | ORAL_TABLET | Freq: Two times a day (BID) | ORAL | 0 refills | Status: DC

## 2023-02-07 MED ORDER — TORSEMIDE 20 MG PO TABS
60.0000 mg | ORAL_TABLET | Freq: Two times a day (BID) | ORAL | Status: DC
  Administered 2023-02-07: 60 mg via ORAL
  Filled 2023-02-07: qty 3

## 2023-02-07 NOTE — TOC Transition Note (Signed)
Transition of Care [facility]) - CM/SW Discharge Note   Patient Details  Name: Douglas Matthews MRN: [id number] Date of Birth: 08-29-1935  [facility] CM/SW Contact:  Konrad Penta, RN Phone Number: [phone] 02/07/2023, 12:38 PM   Clinical Narrative:   [name] transitioning home today. Spoke with Holley Raring with [facility] to make aware of discharge. [name] active with [facility] prior. Also noted consult for outpatient palliative services. Spoke with Cherie with [facility] to make referral for palliative care services. Cherie accepted referral. States the office will contact PCP to get order and will follow up with [name] accordingly.   Spoke with [name]. Reports he lives with his wife. States Mrs. Taverna will transport him home today. Denies having any further TOC needs.     Final next level of care: Home w Home Health Services Barriers to Discharge: No Barriers Identified   Patient Goals and CMS Choice CMS Medicare.gov Compare Post Acute Care list provided to:: Patient Choice offered to / list presented to : Patient  Discharge Placement                         Discharge Plan and Services Additional resources added to the After Visit Summary for                            HH Arranged: RN, PT [facility] Agency: [facility] [place] Date [place]: 02/07/23 Time [place]: 1238 Representative spoke with at [place]: Jimmey Ralph;  [facility] for OP Palliative- Cherie  Social Determinants of Health (SDOH) Interventions SDOH Screenings   Food Insecurity: No Food Insecurity (02/04/2023)  Housing: Low Risk  (02/04/2023)  Transportation Needs: No Transportation Needs (02/04/2023)  Utilities: Not At Risk (02/04/2023)  Depression (PHQ2-9): Low Risk  (12/24/2022)  Financial Resource Strain: Low Risk  (07/08/2022)  Physical Activity: Insufficiently Active (07/08/2022)   Social Connections: Moderately Integrated (07/02/2021)  Stress: No Stress Concern Present (07/08/2022)  Tobacco Use: Low Risk  (02/04/2023)     Readmission Risk Interventions     No data to display

## 2023-02-07 NOTE — Progress Notes (Signed)
   Palliative Medicine Inpatient Follow Up Note HPI: Douglas Matthews is a 88 y.o. male with medical history significant of hypertension, hyperlipidemia, HFrEF (last EF 20-25% on 02/02/23), atrial fibrillation/atrial flutter, anemia, CKD stage IV, and BPH.   Douglas Matthews has been admitted to [facility] in the setting of worsening kidney function.    Palliative care has been consulted due to multiple re-admissions, chronic co-morbidities, and overall declining health to further address goals of care.   Today's Discussion 02/07/2023  *Please note that this is a verbal dictation therefore any spelling or grammatical errors are due to the "[place] One" system interpretation.  Chart reviewed inclusive of vital signs, progress notes, laboratory results, and diagnostic images. Per renal notes patient is not a good candidate for HD.  I spoke to patients night RN, Douglas Matthews who shares that Douglas Matthews had a restful night without concerns/complaints.  I met with Douglas Matthews at bedside this morning. He is sitting at the edge of the bed in NAD. He denies pain, nausea, or SOB.   I provided a MOST form for Douglas Matthews, he shares that he will review this with his wife.   Douglas Matthews is hopeful for discharge in the oncoming day(s).   Created space and opportunity for patient to explore thoughts feelings and fears regarding current medical situation. He vocalizes awareness of his clinical situation in relation to his heart and kidneys. Douglas Matthews is accepting of this point in his life and the fact that he has lived 65 years.   Questions and concerns addressed/Palliative Support Provided.   Objective Assessment: Vital Signs Vitals:   02/06/23 2117 02/07/23 0403  BP: 126/87 108/81  Pulse: (!) 53 98  Resp: 16 15  Temp: 97.6 F (36.4 C) 97.7 F (36.5 C)  SpO2: 100% 100%    Intake/Output Summary (Last 24 hours) at 02/07/2023 0749 Last data filed at 02/07/2023 0407 Gross per 24 hour  Intake 600 ml  Output 1920 ml  Net -1320 ml   Last  Weight  Most recent update: 02/07/2023  4:07 AM    Weight  60.2 kg (132 lb 11.5 oz)            Gen: Elderly African-American male in no acute distress HEENT: moist mucous membranes CV: Regular rate and irregular rhythm PULM: On room air breathing is even and nonlabored ABD: soft/nontender EXT: No edema Neuro: Alert and oriented x3  SUMMARY OF RECOMMENDATIONS   DNAR/DNI   Continue to treat what is treatable   Goals are for improvement in recovery to a level of independence   Both Douglas Matthews and his wife plan to complete advanced directives after discharge --> A MOST form was provided as well  OP Palliative care will be ordered upon discharge for ongoing support --> High risk of further decompensation/rehospitalization   PMT will continue to follow along until discharge  Billing based on MDM: Moderate ______________________________________________________________________________________ Douglas Matthews Team Team Cell Phone: [phone] Please utilize secure chat with additional questions, if there is no response within 30 minutes please call the above phone number  Palliative Medicine Team providers are available by phone from 7am to 7pm daily and can be reached through the team cell phone.  Should this patient require assistance outside of these hours, please call the patient's attending physician.

## 2023-02-07 NOTE — Plan of Care (Signed)

## 2023-02-07 NOTE — Progress Notes (Signed)
[facility] Progress Note   Assessment/ Plan:    Assessment/Plan: AKI w/ CKD IV Patient comes in after recent hospitalization for ARF w/ CKD IV. Patient was d/c w/ Cr 3.04, increased to 4.31 on this admission. Cr improving at 3.45 (3.69) this morning, after diuresis yesterday w/ lasix 60 mg IV x 1, then increased to 120 mg per cardiology. UOP 4.2 L yesterday. Cr elevation on admission thought to be 2/2 to volume overload status of patient, it is likely that his home diuresis was insufficient. - switch to torsemide 60 mg BID today - no need for dialysis, greatly appreciate palliative care   HFrEF Echo from 02/02/23 showed EF 20-25% w/ severely decreased LV function and global hypokinesis, RV systolic function severely reduced, w/ moderately elevated PA systolic pressures, w/ small  pericardial effusion. Patient volume status improved, w/ clear lung exam, and 1-2 + pitting edema in LE to level of distal shin. UOP yesterday 4.2 L. Cardiology consulted and feel patient in cardiogenic shock from biventricular HF, recommending palliative care consult, as patient does not want, and is poor candidate for greater intervention. Will continue IV diuresis, given patient's heart function, he is poor candidate for HD, and it is not recommended for this admission or going forward. As patient is not candidate for higher level of intervention, palliative care has been consulted.  Normocytic anemia Hgb 11.6 (12.3) this am w/ baseline being around ~12 last admission. Likely 2/2 to CKD -Plans per primary team HTN BP 0000000 systolic, well controlled.   A. Fib HR irregularly irregular this morning, patient currently in A. Fib, but rate controlled to 90's. Patient continued on home Eliquis and d/c metoprolol. Patient HR thought to be compensatory to HF processes, helping w/ perfusion of distal extremities, and thus metoprolol was countering that. Metoprolol held w/ patient having notable increased  warmth to hands. -Plans per primary team  6.  Dispo: nearing d/c- pt is eager to go home  Subjective:    Seen in room.  Cr up to 3.7 today.  Robust diuresis with IV lasix.  More comfortable.     Objective:   BP 108/81 (BP Location: Right Arm)   Pulse 98   Temp 97.7 F (36.5 C) (Oral)   Resp 15   Ht '5\' 8"'$  (1.727 m)   Wt 60.2 kg   SpO2 100%   BMI 20.18 kg/m   Intake/Output Summary (Last 24 hours) at 02/07/2023 0710 Last data filed at 02/07/2023 0407 Gross per 24 hour  Intake 600 ml  Output 1920 ml  Net -1320 ml   Weight change: -2.6 kg  Physical Exam: Gen:NAD CVS: RRR Resp: comfortable on RA, few bibasilar crackles which are improving Abd: soft Ext: 1+ LE edema, improved  Imaging: No results found.  Labs: BMET Recent Labs  Lab 02/03/23 1501 02/03/23 1936 02/04/23 0954 02/05/23 0735 02/06/23 0506 02/07/23 0045  NA 142 141 141 139 141 137  K 4.7 5.0 4.7 4.3 4.0 4.1  CL 109 110 111 108 107 102  CO2 20 22 16* 22 21* 24  GLUCOSE 86 113* 87 112* 106* 133*  BUN 117* 116* 124* 111* 110* 110*  CREATININE 4.02* 4.31* 4.00* 3.69* 3.45* 3.65*  CALCIUM 8.8 8.7* 8.5* 8.7* 8.6* 8.7*   CBC Recent Labs  Lab 02/03/23 1936 02/04/23 0954 02/05/23 0735 02/06/23 0506 02/07/23 0045  WBC 5.2 4.6 5.0 4.8 4.7  NEUTROABS 3.4  --   --   --   --   HGB 11.8*  11.7* 12.3* 11.6* 11.5*  HCT 37.5* 35.5* 36.7* 33.9* 35.7*  MCV 90.1 89.0 86.8 85.4 87.5  PLT 188 168 193 203 215    Medications:     apixaban  2.5 mg Oral BID   isosorbide mononitrate  30 mg Oral Daily   multivitamin with minerals  1 tablet Oral Daily   sodium chloride flush  3 mL Intravenous Q12H    Madelon Lips MD 02/07/2023, 7:10 AM

## 2023-02-07 NOTE — Discharge Summary (Signed)
Physician Discharge Summary  Douglas Matthews [id number] DOB: 1935/06/06 DOA: 02/03/2023  PCP: Martinique, [name], MD  Admit date: 02/03/2023 Discharge date: 02/07/2023 Recommendations for Outpatient Follow-up:  Follow up with PCP in 1 weeks-call for appointment Please obtain BMP/CBC in one week  Discharge Dispo: Home Discharge Condition: Stable Code Status:   Code Status: DNR Diet recommendation:  Diet Order             Diet Heart Room service appropriate? 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 He was recently admitted 2/27 - 3/1 for mild AKI.  Nausea vomiting prerenal insult also had high A-fib burden metoprolol was increased and discharged on PRN Lasix he turns with worsening renal function also with Right-sided pleural effusion/peripheral edema.  Nephrology was consulted and admitted for further management. Patient was found to have acute on chronic biventricular heart failure with early cardiogenic shock beta-blocker/hydralazine discontinued managed with high-dose of IV Lasix seen by cardiology nephrology palliative care.  Not pursuing aggressive measures like invasive procedure, HD, volume looks stable and improved transition to torsemide 3/9, and okay for discharge with close follow-up with outpatient palliative care and nephrology.  Patient doing well on room air feels improved leg edema has improved.    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 Nephrology cardiology actively involved transition to oral torsemide creatinine holding stable continue outpatient follow-up with nephrology  Recent Labs    01/28/23 0204 01/28/23 1121 01/29/23 0154 01/30/23 0452 02/03/23 1501 02/03/23 1936 02/04/23 0954 02/05/23 0735 02/06/23 0506 02/07/23 0045  BUN 101* 96* 92* 80* 117* 116* 124* 111* 110* 110*  CREATININE 3.84* 3.59* 3.54* 3.04* 4.02* 4.31* 4.00* 3.69* 3.45* 3.65*    Acute on chronic biventricular heart failure Early cardiogenic shock: Likely will not tolerate HD, managed conservatively with aggressive diuresis, beta-blocker discontinued, cannot tolerate GDMT, cardiology has seen okay for discharge home hydralazine, metoprolol discontinued continue torsemide, Imdur Eliquis.    Nausea: Improved-suspect uremic symptom, Cont antiemetics as needed Normocytic anemia: hb stable, monitor   Essential hypertension: BP stable.beta-blocker discontinued to allow diuresis  Persistent atrial fibrillation on chronic anticoagulation: Continue Eliquis.Not planning to use beta-blocker in the setting of reduced EF given A-fib is compensatory. Goals Of care: Prognosis appears very poor, palliative care involved and plans for outpatient follow-up no invasive measures and no HD   Consults: Cardiology, nephrology and palliative care Subjective: Alert and oriented this morning.  He feels ready for discharge home today, leg edema has improved no shortness of breath.  Discharge Exam: Vitals:   02/07/23 0403 02/07/23 0943  BP: 108/81 109/85  Pulse: 98 97  Resp: 15 17  Temp: 97.7 F (36.5 C) 98.1 F (36.7 C)  SpO2: 100% 98%   General: Pt  is alert, awake, not in acute distress Cardiovascular: RRR, S1/S2 +, no rubs, no gallops Respiratory: CTA  bilaterally, no wheezing, no rhonchi Abdominal: Soft, NT, ND, bowel sounds + Extremities: no edema, no cyanosis  Discharge Instructions  Discharge Instructions     (HEART FAILURE PATIENTS) Call MD:  Anytime you have any of the following symptoms: 1) 3 pound weight gain in 24 hours or 5 pounds in 1 week 2) shortness of breath, with or without a dry hacking cough 3) swelling in the hands, feet or stomach 4) if you have to sleep on extra pillows at night in order to breathe.   Complete by: As directed    Discharge instructions   Complete by: As directed    Please call call MD or return to ER for similar or worsening recurring problem that brought you to hospital or if any fever,nausea/vomiting,abdominal pain, uncontrolled pain, chest pain,  shortness of breath or any other alarming symptoms.  Please follow-up your doctor as instructed in a week time and call the office for appointment.  Please avoid alcohol, smoking, or any other illicit substance and maintain healthy habits including taking your regular medications as prescribed.  You were cared for by a hospitalist during your hospital stay. If you have any questions about your discharge medications or the care you received while you were in the hospital after you are discharged, you can call the unit and ask to speak with the hospitalist on call if the hospitalist that took care of you is not available.  Once you are discharged, your primary care physician will handle any further medical issues. Please note that NO REFILLS for any discharge medications will be authorized once you are discharged, as it is imperative that you return to your primary care physician (or establish a relationship with a primary care physician if you do not have one) for your aftercare needs so that they can reassess your need for medications and monitor your lab values   Face-to-face encounter (required for Medicare/Medicaid patients)   Complete by: As directed    I  Antonieta Pert certify that this patient is under my care and that I, or a nurse practitioner or physician's assistant working with me, had a face-to-face encounter that meets the physician face-to-face encounter requirements with this patient on 02/07/2023. The encounter with the patient was in whole, or in part for the following medical condition(s) which is the primary reason for home health care (List medical condition): CHF, deconditioning, renal failure   The encounter with the patient was in whole, or in part, for the following medical condition, which is the primary reason for home health care: CHF, deconditioning, renal failure   I certify that, based on my findings, the following services are medically necessary home health services: Physical therapy   Reason for Medically Necessary Home Health Services: Therapy- Therapeutic Exercises to Increase Strength and Endurance   My clinical findings support the need for the above services: Shortness of breath with activity   Further, I certify that my clinical findings support that this patient is homebound due to: Unable to leave home safely without assistance   Home Health   Complete by: As directed    To provide the following care/treatments:  PT RN     Increase activity slowly   Complete by: As directed       Allergies as of 02/07/2023   No Known Allergies      Medication List     STOP taking  these medications    furosemide 40 MG tablet Commonly known as: LASIX   hydrALAZINE 10 MG tablet Commonly known as: APRESOLINE   metoprolol succinate 100 MG 24 hr tablet Commonly known as: Toprol XL   ondansetron 4 MG tablet Commonly known as: Zofran       TAKE these medications    apixaban 2.5 MG Tabs tablet Commonly known as: ELIQUIS Take 1 tablet (2.5 mg total) by mouth 2 (two) times daily.   COD LIVER OIL PO Take 1 tablet by mouth daily.   GARLIC PO Take 1 tablet by mouth daily.   isosorbide mononitrate 30 MG 24 hr  tablet Commonly known as: IMDUR Take 1 tablet (30 mg total) by mouth daily.   multivitamin capsule Take 1 capsule by mouth daily.   Saw Palmetto 450 MG Caps Take 900 mg by mouth daily.   Torsemide 60 MG Tabs Take 60 mg by mouth 2 (two) times daily.        Follow-up Information     Martinique, [name], MD Follow up in 1 week(s).   Specialty: Family Medicine Contact information: [place] [address] [phone]         Donato Heinz, MD Follow up in 1 week(s).   Specialty: Nephrology Contact information: [place] [address] [phone]                No Known Allergies  The results of significant diagnostics from this hospitalization (including imaging, microbiology, ancillary and laboratory) are listed below for reference.    Microbiology: No results found for this or any previous visit (from the past 240 hour(s)).  Procedures/Studies: DG Chest 2 View  Result Date: 02/04/2023 CLINICAL DATA:  Bilateral basilar rales EXAM: CHEST - 2 VIEW COMPARISON:  01/27/2023 FINDINGS: Cardiac shadow is enlarged. Aortic calcifications are noted. Lungs are well aerated bilaterally. No focal infiltrate is seen. Small right-sided pleural effusion is noted. No bony abnormality is seen. IMPRESSION: Right-sided pleural effusion.  No other focal abnormality is noted. Electronically Signed   By: Inez Catalina M.D.   On: 02/04/2023 01:00   ECHOCARDIOGRAM COMPLETE  Result Date: 02/02/2023    ECHOCARDIOGRAM REPORT   Patient Name:   Douglas Matthews Date of Exam: 02/02/2023 Medical Rec #:  [id number]        Height:       68.0 in Accession #:    [id number]       Weight:       136.6 lb Date of Birth:  1935-01-24       BSA:          1.738 m Patient Age:    27 years         BP:           126/68 mmHg Patient Gender: M                HR:           116 bpm. Exam Location:  [facility] Procedure: 2D Echo, Cardiac Doppler, Color Doppler and 3D Echo  Indications:    I50.42 Chronic combined systolic (congestive) and diastolic                 (congestive) heart failure  History:        Patient has prior history of Echocardiogram examinations, most                 recent 10/29/2022. Arrythmias:Atrial Fibrillation, Atrial  Flutter and LBBB; Risk Factors:Hypertension and Dyslipidemia.  Sonographer:    Diamond Nickel RCS Referring Phys: [place]  1. Left ventricular ejection fraction, by estimation, is 20 to 25%. The left ventricle has severely decreased function. The left ventricle demonstrates global hypokinesis. There is moderate left ventricular hypertrophy. Left ventricular diastolic parameters are indeterminate.  2. Right ventricular systolic function is severely reduced. The right ventricular size is normal. There is moderately elevated pulmonary artery systolic pressure. The estimated right ventricular systolic pressure is 0000000 mmHg.  3. Left atrial size was mildly dilated.  4. Right atrial size was mildly dilated.  5. A small pericardial effusion is present.  6. The mitral valve is abnormal. Moderate mitral valve regurgitation. Appears functional. No evidence of mitral stenosis.  7. Tricuspid valve regurgitation is moderate to severe.  8. The aortic valve is calcified. There is moderate calcification of the aortic valve. Aortic valve regurgitation is trivial. Aortic valve sclerosis/calcification is present, without any evidence of aortic stenosis.  9. The inferior vena cava is dilated in size with <50% respiratory variability, suggesting right atrial pressure of 15 mmHg. FINDINGS  Left Ventricle: Left ventricular ejection fraction, by estimation, is 20 to 25%. The left ventricle has severely decreased function. The left ventricle demonstrates global hypokinesis. The left ventricular internal cavity size was normal in size. There is moderate left ventricular hypertrophy. Left ventricular diastolic parameters are  indeterminate. Right Ventricle: The right ventricular size is normal. No increase in right ventricular wall thickness. Right ventricular systolic function is severely reduced. There is moderately elevated pulmonary artery systolic pressure. The tricuspid regurgitant velocity is 2.97 m/s, and with an assumed right atrial pressure of 15 mmHg, the estimated right ventricular systolic pressure is 0000000 mmHg. Left Atrium: Left atrial size was mildly dilated. Right Atrium: Right atrial size was mildly dilated. Pericardium: A small pericardial effusion is present. Mitral Valve: The mitral valve is abnormal. Moderate mitral valve regurgitation. No evidence of mitral valve stenosis. Tricuspid Valve: The tricuspid valve is normal in structure. Tricuspid valve regurgitation is moderate to severe. Aortic Valve: The aortic valve is calcified. There is moderate calcification of the aortic valve. Aortic valve regurgitation is trivial. Aortic valve sclerosis/calcification is present, without any evidence of aortic stenosis. Pulmonic Valve: The pulmonic valve was not well visualized. Pulmonic valve regurgitation is mild. Aorta: The aortic root and ascending aorta are structurally normal, with no evidence of dilitation. Venous: The inferior vena cava is dilated in size with less than 50% respiratory variability, suggesting right atrial pressure of 15 mmHg. IAS/Shunts: The interatrial septum was not well visualized.  LEFT VENTRICLE PLAX 2D LVIDd:         4.30 cm   Diastology LVIDs:         3.60 cm   LV e' medial:    4.31 cm/s LV PW:         1.60 cm   LV E/e' medial:  23.1 LV IVS:        1.60 cm   LV e' lateral:   8.10 cm/s LVOT diam:     2.00 cm   LV E/e' lateral: 12.3 LV SV:         24 LV SV Index:   14 LVOT Area:     3.14 cm                           3D Volume EF:  3D EF:        20 %                          LV EDV:       172 ml                          LV ESV:       138 ml                          LV SV:         34 ml RIGHT VENTRICLE RV Basal diam:  3.20 cm RV S prime:     5.68 cm/s TAPSE (M-mode): 0.7 cm RVSP:           50.3 mmHg LEFT ATRIUM           Index        RIGHT ATRIUM            Index LA diam:      4.90 cm 2.82 cm/m   RA Pressure: 15.00 mmHg LA Vol (A2C): 77.1 ml 44.36 ml/m  RA Area:     19.60 cm LA Vol (A4C): 45.4 ml 26.12 ml/m  RA Volume:   59.50 ml   34.23 ml/m  AORTIC VALVE LVOT Vmax:   72.97 cm/s LVOT Vmean:  43.800 cm/s LVOT VTI:    0.078 m  AORTA Ao Root diam: 3.70 cm Ao Asc diam:  3.50 cm MITRAL VALVE                  TRICUSPID VALVE MV Area (PHT): 6.67 cm       TR Peak grad:   35.3 mmHg MV Decel Time: 114 msec       TR Vmax:        297.00 cm/s MR Peak grad:    83.4 mmHg    Estimated RAP:  15.00 mmHg MR Mean grad:    45.7 mmHg    RVSP:           50.3 mmHg MR Vmax:         456.67 cm/s MR Vmean:        301.3 cm/s   SHUNTS MR PISA:         3.08 cm     Systemic VTI:  0.08 m MR PISA Eff ROA: 26 mm       Systemic Diam: 2.00 cm MR PISA Radius:  0.70 cm MV E velocity: 99.60 cm/s MV A velocity: 35.37 cm/s MV E/A ratio:  2.82 Oswaldo Milian MD Electronically signed by Oswaldo Milian MD Signature Date/Time: 02/02/2023/7:15:36 PM    Final    DG Chest 2 View  Result Date: 01/27/2023 CLINICAL DATA:  Shortness of breath.  Chest discomfort. EXAM: CHEST - 2 VIEW COMPARISON:  10/27/2022 FINDINGS: Heart size is normal. There is blunting of the right costophrenic angle. Opacity within the right base is noted which may reflect atelectasis or airspace disease. No interstitial edema. Left lung appears clear. IMPRESSION: Small right pleural effusion with right base opacity which may reflect atelectasis or airspace disease. Electronically Signed   By: Kerby Moors M.D.   On: 01/27/2023 16:55    Labs: BNP (last 3 results) Recent Labs    01/27/23 1910 01/30/23 0452 02/03/23 1936  BNP 856.9* 751.6* 0000000*   Basic Metabolic Panel: Recent Labs  Lab 02/03/23 1936  02/04/23 0954 02/05/23 0735  02/06/23 0506 02/07/23 0045  NA 141 141 139 141 137  K 5.0 4.7 4.3 4.0 4.1  CL 110 111 108 107 102  CO2 22 16* 22 21* 24  GLUCOSE 113* 87 112* 106* 133*  BUN 116* 124* 111* 110* 110*  CREATININE 4.31* 4.00* 3.69* 3.45* 3.65*  CALCIUM 8.7* 8.5* 8.7* 8.6* 8.7*  MG  --  2.4  --   --   --    Liver Function Tests: Recent Labs  Lab 02/04/23 0954 02/05/23 0735  AST 48* 40  ALT 71* 64*  ALKPHOS 136* 131*  BILITOT 0.9 0.9  PROT 6.1* 6.3*  ALBUMIN 3.0* 3.0*  CBC: Recent Labs  Lab 02/03/23 1936 02/04/23 0954 02/05/23 0735 02/06/23 0506 02/07/23 0045  WBC 5.2 4.6 5.0 4.8 4.7  NEUTROABS 3.4  --   --   --   --   HGB 11.8* 11.7* 12.3* 11.6* 11.5*  HCT 37.5* 35.5* 36.7* 33.9* 35.7*  MCV 90.1 89.0 86.8 85.4 87.5  PLT 188 168 193 203 215  No results for input(s): "VITAMINB12", "FOLATE", "FERRITIN", "TIBC", "IRON", "RETICCTPCT" in the last 72 hours. Urinalysis    Component Value Date/Time   COLORURINE YELLOW 02/04/2023 0318   APPEARANCEUR CLEAR 02/04/2023 0318   LABSPEC 1.012 02/04/2023 0318   PHURINE 5.0 02/04/2023 0318   GLUCOSEU NEGATIVE 02/04/2023 0318   HGBUR NEGATIVE 02/04/2023 0318   HGBUR negative 09/19/2010 1013   BILIRUBINUR NEGATIVE 02/04/2023 0318   BILIRUBINUR n 04/26/2015 1054   KETONESUR NEGATIVE 02/04/2023 0318   PROTEINUR 30 (A) 02/04/2023 0318   UROBILINOGEN 0.2 04/26/2015 1054   UROBILINOGEN 0.2 09/19/2010 1013   NITRITE NEGATIVE 02/04/2023 0318   LEUKOCYTESUR NEGATIVE 02/04/2023 0318   Sepsis Labs Recent Labs  Lab 02/04/23 0954 02/05/23 0735 02/06/23 0506 02/07/23 0045  WBC 4.6 5.0 4.8 4.7   Microbiology No results found for this or any previous visit (from the past 240 hour(s)).   Time coordinating discharge: 35 minutes  SIGNED: Antonieta Pert, MD  [facility] 02/07/2023, 11:44 AM  If 7PM-7AM, please contact night-coverage [email or web address]

## 2023-02-07 NOTE — Progress Notes (Signed)
Progress Note  Patient Name: Douglas Matthews Date of Encounter: 02/07/2023  Primary Cardiologist: Mertie Moores, MD   Subjective   Patient has been transitioned to PO torsemide. Seen by Nephrology early this morning: he is not planned for procedural interventions either by cardiology or nephrology. Has started seeing Palliative care  Inpatient Medications    Scheduled Meds:  apixaban  2.5 mg Oral BID   isosorbide mononitrate  30 mg Oral Daily   multivitamin with minerals  1 tablet Oral Daily   sodium chloride flush  3 mL Intravenous Q12H   torsemide  60 mg Oral BID   Continuous Infusions:   PRN Meds: acetaminophen **OR** acetaminophen, albuterol   Vital Signs    Vitals:   02/06/23 1700 02/06/23 2117 02/07/23 0403 02/07/23 0407  BP: 106/77 126/87 108/81   Pulse: (!) 106 (!) 53 98   Resp: '17 16 15   '$ Temp: 98.3 F (36.8 C) 97.6 F (36.4 C) 97.7 F (36.5 C)   TempSrc: Oral Oral Oral   SpO2: 100% 100% 100%   Weight:  60.2 kg  60.2 kg  Height:        Intake/Output Summary (Last 24 hours) at 02/07/2023 0745 Last data filed at 02/07/2023 0407 Gross per 24 hour  Intake 600 ml  Output 1920 ml  Net -1320 ml   Filed Weights   02/06/23 0430 02/06/23 2117 02/07/23 0407  Weight: 60.1 kg 60.2 kg 60.2 kg    Telemetry    AF variabl rates - Personally Reviewed  Physical Exam   Gen: no distress, thin male   Neck: JVD to mid neck Cardiac: No Rubs or Gallops, holosystolic murmur, IRIR +2 radial pulses Respiratory: Clear to auscultation bilaterally, normal effort, normal  respiratory rate GI: Soft, nontender, non-distended  MS: +2 ankle edema;  moves all extremities Integument: Skin feels warm in the hands and feet Neuro:  At time of evaluation, alert and oriented to person/place/time/situation  Psych: Normal affect, patient feels better   Labs    Chemistry Recent Labs  Lab 02/04/23 0954 02/05/23 0735 02/06/23 0506 02/07/23 0045  NA 141 139 141 137  K 4.7  4.3 4.0 4.1  CL 111 108 107 102  CO2 16* 22 21* 24  GLUCOSE 87 112* 106* 133*  BUN 124* 111* 110* 110*  CREATININE 4.00* 3.69* 3.45* 3.65*  CALCIUM 8.5* 8.7* 8.6* 8.7*  PROT 6.1* 6.3*  --   --   ALBUMIN 3.0* 3.0*  --   --   AST 48* 40  --   --   ALT 71* 64*  --   --   ALKPHOS 136* 131*  --   --   BILITOT 0.9 0.9  --   --   GFRNONAA 14* 15* 17* 16*  ANIONGAP '14 9 13 11     '$ Hematology Recent Labs  Lab 02/05/23 0735 02/06/23 0506 02/07/23 0045  WBC 5.0 4.8 4.7  RBC 4.23 3.97* 4.08*  HGB 12.3* 11.6* 11.5*  HCT 36.7* 33.9* 35.7*  MCV 86.8 85.4 87.5  MCH 29.1 29.2 28.2  MCHC 33.5 34.2 32.2  RDW 16.2* 15.9* 16.0*  PLT 193 203 215    Cardiac EnzymesNo results for input(s): "TROPONINI" in the last 168 hours. No results for input(s): "TROPIPOC" in the last 168 hours.   BNP Recent Labs  Lab 02/03/23 1501 02/03/23 1936  BNP  --  878.0*  PROBNP 1,760.0*  --      DDimer No results for input(s): "DDIMER"  in the last 168 hours.   Radiology    No results found.   Patient Profile     88 y.o. male with SCAI B Classification cardiogenic shock and CKD Stage V  Assessment & Plan     Acute on chronic biventricular heart failure CDK stage V HTN and HLD LBBB - NYHA III, Stage D,  hypervolemic - cannot tolerate GDMT; would not start BB as his heart rate is compensatory - transitioned to PO torsemide - continue IMDUR - would recommend to finalized [place] pending discharge to prevent readmission; he is hospice appropriate and we have discussed this  Persistent atrial fibrillation - compensatory from heart failure - doing better off beta blocker - continue Eliquis unless GOC change     [place] will sign off.   Medication Recommendations:  eliquis and Imdur; stop hydralazine and BB from outpatient cardiac list Follow up as an outpatient:  has 04/21/23 f/u appointment; OP palliative care f/u pending     For questions or updates, please contact [facility] Please consult [email or web address] for contact info under Cardiology/STEMI.      Rudean Haskell, MD [address] [place], [place] [address] [phone]  7:45 AM

## 2023-02-09 ENCOUNTER — Telehealth: Payer: Self-pay

## 2023-02-09 ENCOUNTER — Telehealth: Payer: Self-pay | Admitting: Family Medicine

## 2023-02-09 ENCOUNTER — Encounter (HOSPITAL_COMMUNITY): Payer: Medicare Other

## 2023-02-09 MED ORDER — TORSEMIDE 20 MG PO TABS: 20.0000 mg | ORAL_TABLET | Freq: Every day | ORAL | 3 refills | Status: DC

## 2023-02-09 MED ORDER — TORSEMIDE 100 MG PO TABS: 100.0000 mg | ORAL_TABLET | Freq: Every day | ORAL | 3 refills | Status: DC

## 2023-02-09 NOTE — Telephone Encounter (Signed)
Douglas Matthews with [facility] would like order for the pt to enroll with palliative care please call or fax order [phone]

## 2023-02-09 NOTE — Telephone Encounter (Signed)
It is ok to have Torsemide 100 mg and 20 mg to take 120 mg daily as prescribed. Thanks, BJ

## 2023-02-09 NOTE — Transitions of Care (Post Inpatient/ED Visit) (Signed)
02/09/2023  Name: Douglas Matthews MRN: [id number] DOB: 02/03/1935  Today's TOC FU Call Status: Today's TOC FU Call Status:: Successful TOC FU Call Competed TOC FU Call Complete Date: 02/09/23  Transition Care Management Follow-up Telephone Call Date of Discharge: 02/07/23 Discharge Facility: [facility]) Type of Discharge: Inpatient Admission Primary Inpatient Discharge Diagnosis:: "AKI,uremia" How have you been since you were released from the hospital?: Better (Call completed with spouse. She reports patient doing well so far-currently getting dressed. He rested well last night and had ate a good breakfast. She states patient is anxious to 'get back to normal and doesn't like to sit still.") Any questions or concerns?: Yes Patient Questions/Concerns:: Spouse shares that new Torsemide is going to cost around $800 based on dosage written by hospital MD Patient Questions/Concerns Addressed: Other:, Notified Provider of Patient Questions/Concerns (Spouse has already contacted PCP office to see if dosage can be changed to be able to get med at a lower cost and is awaiting return call.)  Items Reviewed: Did you receive and understand the discharge instructions provided?: Yes Medications obtained and verified?: Yes (Medications Reviewed) Any new allergies since your discharge?: No Dietary orders reviewed?: Yes Type of Diet Ordered:: low salt/heart healthy Do you have support at home?: Yes People in Home: spouse Name of Support/Comfort Primary Source: [place] and Equipment/Supplies: [place] Ordered?: Yes Name of [place]:: [facility] Has Agency set up a time to come to your home?: No (Spouse states agency called today to discuss case and will be calling her back with appt time of home visit.Patient has also been referred to palliative care services with [facility].) Any new equipment or medical supplies ordered?: NA  Functional  Questionnaire: Do you need assistance with bathing/showering or dressing?: No Do you need assistance with meal preparation?: Yes Do you need assistance with eating?: No Do you have difficulty maintaining continence: No Do you need assistance with getting out of bed/getting out of a chair/moving?: No Do you have difficulty managing or taking your medications?: Yes  Folllow up appointments reviewed: PCP Follow-up appointment confirmed?: No (offered to assist spouse with making PCP appt per d/c instructions-she states she will just make one when MD office calls her back about med issue) [facility] Follow-up appointment confirmed?: Yes Date of Specialist follow-up appointment?: 02/23/23 Follow-Up Specialty Provider:: Dr. Arty Baumgartner Do you need transportation to your follow-up appointment?: No Do you understand care options if your condition(s) worsen?: Yes-patient verbalized understanding  SDOH Interventions Today    Flowsheet Row Most Recent Value  SDOH Interventions   Food Insecurity Interventions Intervention Not Indicated  Transportation Interventions Intervention Not Indicated      TOC Interventions Today    Flowsheet Row Most Recent Value  TOC Interventions   TOC Interventions Discussed/Reviewed TOC Interventions Discussed, Post discharge activity limitations per provider      Interventions Today    Flowsheet Row Most Recent Value  Chronic Disease   Chronic disease during today's visit Chronic Kidney Disease/End Stage Renal Disease (ESRD)  General Interventions   General Interventions Discussed/Reviewed General Interventions Discussed, Doctor Visits  Doctor Visits Discussed/Reviewed Doctor Visits Discussed, PCP  PCP/Specialist Visits Compliance with follow-up visit  Education Interventions   Education Provided Provided Education  [BP and cardiac sx monitoring in the home]  Provided Verbal Education On Nutrition, Medication, When to see the doctor  Nutrition  Interventions   Nutrition Discussed/Reviewed Nutrition Discussed, Decreasing salt  Pharmacy Interventions  Pharmacy Dicussed/Reviewed Pharmacy Topics Discussed, Medications and their functions  Safety Interventions   Safety Discussed/Reviewed Safety Discussed, Fall Risk      Hetty Blend [facility] Care Management Care Management Community Coordinator Direct Phone: [phone] Toll Free: [phone] Fax: [phone]

## 2023-02-09 NOTE — Telephone Encounter (Signed)
Pt was discharged from [facility] on Saturday.   Pt was prescribed Torsemide, but was told by the pharmacy the medication will cost them about $800.  They are asking if MD could please send a more affordable alternative or any assistance possible would be greatly appreciated.   Please advise.

## 2023-02-09 NOTE — Telephone Encounter (Signed)
Patient's wife is not sure if pt needs palliative, will hold off until they speak with pcp tomorrow.

## 2023-02-09 NOTE — Progress Notes (Unsigned)
HPI: [name] is a 88 y.o. male, who is here today to follow on recent hospitalization. He was last seen on 02/03/23, to follow on hospitalization 2/27-3/1. After last visit, due to blood work abnormalities, he was seen to the ER for further evaluation. He was admitted on 02/03/2023 for AKI and acute HFrEF . He was discharged on 02/07/2023. TOC call on 02/09/23.  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 Creatinine at the time of the discharge 3.6 (e GFR 16), it was 4.3 (e GFR 13) on admission. Nephrology and cardiology consultation done during hospitalization. Furosemide was discontinued. His wife has some questions about BMP numbers and how numbers can improve. He has not noted form in urine, gross hematuria, or decreased urine output. Nausea has resolved.  Acute on chronic biventricular CHF, early cardiogenic shock.  It was decided to treat with aggressive diuresis because most likely he will not tolerate hemodialysis.   Initially treated with IV furosemide, transition to torsemide 120 mg daily, which he has not started. He is not interested in dialysis and states that his nephrologist did not think he needed at this time. He is now sleeping on 1 pillow, denies orthopnea or PND. Lower extremity edema has improved. He is weighing himself daily and reported 2 pounds weight gain in the past 24 hours.  Hypertension: Currently he is on Imdur 30 mg daily. Hydralazine and metoprolol were discontinued. He is on Eliquis 2.5 mg twice daily. BP readings at home 120s/70s. Denies CP, dyspnea, palpitations, or diaphoresis.  Lab Results  Component Value Date   CREATININE 3.65 (H) 02/07/2023   BUN 110 (H) 02/07/2023   NA 137 02/07/2023   K 4.1 02/07/2023   CL 102 02/07/2023   CO2 24 02/07/2023   Lab Results  Component Value Date   WBC 4.7 02/07/2023   HGB 11.5 (L) 02/07/2023   HCT 35.7 (L) 02/07/2023   MCV 87.5 02/07/2023   PLT 215 02/07/2023   His  appetite has improved. He is following low-salt diet and limiting fluid intake.  Poor prognosis, so he was referred to palliative care. His wife doe snot think he needs this at this time. He is feeling better. Still having some fatigue but eager to start working outdoors.  Review of Systems  Constitutional:  Negative for chills and fever.  HENT:  Negative for nosebleeds and sore throat.   Eyes:  Negative for redness and visual disturbance.  Respiratory:  Negative for cough and wheezing.   Gastrointestinal:  Negative for abdominal pain, nausea and vomiting.  Skin:  Negative for rash.  Neurological:  Negative for syncope, weakness and headaches.  Psychiatric/Behavioral:  Negative for confusion. The patient is not nervous/anxious.   See other pertinent positives and negatives in HPI.  Current Outpatient Medications on File Prior to Visit  Medication Sig Dispense Refill   apixaban (ELIQUIS) 2.5 MG TABS tablet Take 1 tablet (2.5 mg total) by mouth 2 (two) times daily. 180 tablet 1   COD LIVER OIL PO Take 1 tablet by mouth daily.     GARLIC PO Take 1 tablet by mouth daily.     isosorbide mononitrate (IMDUR) 30 MG 24 hr tablet Take 1 tablet (30 mg total) by mouth daily. 90 tablet 1   Multiple Vitamin (MULTIVITAMIN) capsule Take 1 capsule by mouth daily.     Saw Palmetto 450 MG CAPS Take 900 mg by mouth daily.     torsemide (DEMADEX) 100 MG tablet  Take 1 tablet (100 mg total) by mouth daily. Take with 20 mg daily to equal 120 mg total. 30 tablet 3   torsemide (DEMADEX) 20 MG tablet Take 1 tablet (20 mg total) by mouth daily. Take daily with 100 mg to equal 120 mg total. 30 tablet 3   No current facility-administered medications on file prior to visit.   Past Medical History:  Diagnosis Date   Anemia    Atrial fibrillation (HCC)    BPH (benign prostatic hypertrophy)    Chronic kidney disease    Diverticulosis    Hyperlipidemia    Hypertension    No Known Allergies  Social History    Socioeconomic History   Marital status: Married    Spouse name: Not on file   Number of children: Not on file   Years of education: Not on file   Highest education level: Not on file  Occupational History   Occupation: Retired  Tobacco Use   Smoking status: Never   Smokeless tobacco: Never  Vaping Use   Vaping Use: Never used  Substance and Sexual Activity   Alcohol use: No   Drug use: No   Sexual activity: Not on file  Other Topics Concern   Not on file  Social History Narrative   Married x 50 years    Social Determinants of Health   Financial Resource Strain: Low Risk  (07/08/2022)   Overall Financial Resource Strain (CARDIA)    Difficulty of Paying Living Expenses: Not hard at all  Food Insecurity: No Food Insecurity (02/09/2023)   Hunger Vital Sign    Worried About Running Out of Food in the Last Year: Never true    [place] in the Last Year: Never true  Transportation Needs: No Transportation Needs (02/09/2023)   PRAPARE - Hydrologist (Medical): No    Lack of Transportation (Non-Medical): No  Physical Activity: Insufficiently Active (07/08/2022)   Exercise Vital Sign    Days of Exercise per Week: 2 days    Minutes of Exercise per Session: 30 min  Stress: No Stress Concern Present (07/08/2022)   [place]    Feeling of Stress : Not at all  Social Connections: Moderately Integrated (07/02/2021)   Social Connection and Isolation Panel [NHANES]    Frequency of Communication with Friends and Family: Three times a week    Frequency of Social Gatherings with Friends and Family: Three times a week    Attends Religious Services: More than 4 times per year    Active Member of Clubs or Organizations: No    Attends Archivist Meetings: Never    Marital Status: Married   Vitals:   02/10/23 1436  BP: 120/70  Pulse: (!) 56  Resp: 16  SpO2: 98%  Body mass index is  20.18 kg/m.  Physical Exam Vitals and nursing note reviewed.  Constitutional:      General: He is not in acute distress.    Appearance: He is well-developed.  HENT:     Head: Normocephalic and atraumatic.     Mouth/Throat:     Mouth: Mucous membranes are moist.     Dentition: Has dentures.     Pharynx: Oropharynx is clear.  Eyes:     Conjunctiva/sclera: Conjunctivae normal.  Neck:     Vascular: Hepatojugular reflux present.  Cardiovascular:     Rate and Rhythm: Normal rate. Rhythm irregular.     Heart  sounds: No murmur heard.    Comments: HR: 61/min. Trace pitting LE edema, bilateral.  Pulmonary:     Effort: Pulmonary effort is normal. No respiratory distress.     Breath sounds: Normal breath sounds.  Abdominal:     Palpations: Abdomen is soft. There is no mass.     Tenderness: There is no abdominal tenderness.  Skin:    General: Skin is warm.     Findings: No erythema or rash.  Neurological:     Mental Status: He is alert and oriented to person, place, and time.     Cranial Nerves: No cranial nerve deficit.     Gait: Gait normal.  Psychiatric:        Mood and Affect: Mood and affect normal.   ASSESSMENT AND PLAN:  Douglas Matthews was seen today for hospitalization follow-up.  Diagnoses and all orders for this visit:  Acute kidney injury superimposed on chronic kidney disease ([place]) Assessment & Plan: Creatinine is still not at his baseline. Cr at the time of discharge 3.6 and e GFR 16. Continue low-salt diet, adequate hydration, and avoidance of NSAIDs. He has an appointment with his nephrologist in about 2 weeks. He expressed reluctance towards dialysis if recommended. Further recommendation will be given according to lab results. If blood work suggest that problem is getting worse, he does not want to be sent to the ER.  Orders: -     Basic metabolic panel; Future -     CBC; Future -     Basic metabolic panel; Future  Normocytic anemia Assessment &  Plan: Anemia of chronic disease. It has been stable, H/H11.5/35.7 on 02/07/2023.    Persistent atrial fibrillation ([place]) Assessment & Plan: He is no longer on metoprolol. Continue Eliquis 2.5 mg twice daily. Following with cardiologist.   Primary hypertension Assessment & Plan: BP adequately controlled. Hydralazine and metoprolol has been discontinued. Continue Imdur 30 mg daily. Continue low salt diet. He is monitoring BP regularly.  Orders: -     Basic metabolic panel; Future  Heart failure with reduced ejection fraction [facility]) Assessment & Plan: Last echo 02/02/23 revealed LVEF 20 to 25% (30 to 35% in 10/2022).  He is no longer having orthopnea or PND. 2 pounds weight gain in the past 24 hours. Continue weighing daily. He has not started torsemide 120 mg daily, he is going to pick up medication now and started as instructed. We discussed some side effects of medication. Will plan on repeating BMP in 1 week. Following with cardiologist.   I spent a total of 51 minutes in both face to face and non face to face activities for this visit on the date of this encounter. During this time history was obtained and documented, examination was performed, prior labs/imaging reviewed, and assessment/plan discussed. He does not want to be sent to the ER for abnormal labs. Return in about 1 week (around 02/17/2023) for Labs.  Douglas Munday G. [place], MD  [facility]. [place] office.

## 2023-02-09 NOTE — Telephone Encounter (Signed)
It is ok to authorize hospice/palliative care evaluation. Thanks, BJ

## 2023-02-09 NOTE — Telephone Encounter (Signed)
I spoke with patient's wife. She is aware that new Rx's were sent in. Hospital follow up is scheduled for tomorrow at 2:30pm. Patient's wife is not sure he needs palliative care, so will hold off until they see pcp tomorrow and discuss then.

## 2023-02-09 NOTE — Telephone Encounter (Signed)
This is because it's being prescribed as the 60 mg tablet which comes as brand name. The hospital wants him to take 120 mg daily, are you okay changing to 20 mg tablets or 100 mg tablet and a 20 mg tablet daily?

## 2023-02-10 ENCOUNTER — Telehealth: Payer: Self-pay | Admitting: Family Medicine

## 2023-02-10 ENCOUNTER — Ambulatory Visit (INDEPENDENT_AMBULATORY_CARE_PROVIDER_SITE_OTHER): Payer: Medicare Other | Admitting: Family Medicine

## 2023-02-10 ENCOUNTER — Encounter: Payer: Self-pay | Admitting: Family Medicine

## 2023-02-10 VITALS — BP 120/70 | HR 56 | Resp 16 | Ht 68.0 in

## 2023-02-10 DIAGNOSIS — N179 Acute kidney failure, unspecified: Secondary | ICD-10-CM

## 2023-02-10 DIAGNOSIS — N189 Chronic kidney disease, unspecified: Secondary | ICD-10-CM

## 2023-02-10 DIAGNOSIS — D649 Anemia, unspecified: Secondary | ICD-10-CM | POA: Diagnosis not present

## 2023-02-10 DIAGNOSIS — I10 Essential (primary) hypertension: Secondary | ICD-10-CM | POA: Diagnosis not present

## 2023-02-10 DIAGNOSIS — I4819 Other persistent atrial fibrillation: Secondary | ICD-10-CM | POA: Diagnosis not present

## 2023-02-10 DIAGNOSIS — I5020 Unspecified systolic (congestive) heart failure: Secondary | ICD-10-CM | POA: Diagnosis not present

## 2023-02-10 NOTE — Patient Instructions (Addendum)
A few things to remember from today's visit:  Acute kidney injury superimposed on chronic kidney disease ([place]) - Plan: Basic metabolic panel, CBC  Atrial fibrillation with RVR (HCC)  Normocytic anemia  Start Torsemide as recommended. We need to repeat kidney function in 1 week.  If you need refills for medications you take chronically, please call your pharmacy. Do not use My Chart to request refills or for acute issues that need immediate attention. If you send a my chart message, it may take a few days to be addressed, specially if I am not in the office.  Please be sure medication list is accurate. If a new problem present, please set up appointment sooner than planned today.

## 2023-02-10 NOTE — Telephone Encounter (Signed)
I spoke with Manus Gunning, she is aware patient & wife not interested at this time.

## 2023-02-10 NOTE — Assessment & Plan Note (Signed)
Anemia of chronic disease. It has been stable, H/H11.5/35.7 on 02/07/2023.

## 2023-02-10 NOTE — Assessment & Plan Note (Signed)
BP adequately controlled. Hydralazine and metoprolol has been discontinued. Continue Imdur 30 mg daily. Continue low salt diet. He is monitoring BP regularly.

## 2023-02-10 NOTE — Telephone Encounter (Signed)
Noted  

## 2023-02-10 NOTE — Assessment & Plan Note (Addendum)
Last echo 02/02/23 revealed LVEF 20 to 25% (30 to 35% in 10/2022).  He is no longer having orthopnea or PND. 2 pounds weight gain in the past 24 hours. Continue weighing daily. He has not started torsemide 120 mg daily, he is going to pick up medication now and started as instructed. We discussed some side effects of medication. Will plan on repeating BMP in 1 week. Following with cardiologist.

## 2023-02-10 NOTE — Assessment & Plan Note (Signed)
He is no longer on metoprolol. Continue Eliquis 2.5 mg twice daily. Following with cardiologist.

## 2023-02-10 NOTE — Telephone Encounter (Signed)
Patient isn't home bound, has no skilled nursing need, was not admitted for home health. If provider thinks differently, please contact agency.

## 2023-02-10 NOTE — Assessment & Plan Note (Signed)
Creatinine is still not at his baseline. Cr at the time of discharge 3.6 and e GFR 16. Continue low-salt diet, adequate hydration, and avoidance of NSAIDs. He has an appointment with his nephrologist in about 2 weeks. He expressed reluctance towards dialysis if recommended. Further recommendation will be given according to lab results. If blood work suggest that problem is getting worse, he does not want to be sent to the ER.

## 2023-02-11 ENCOUNTER — Encounter (HOSPITAL_COMMUNITY): Payer: Medicare Other

## 2023-02-11 ENCOUNTER — Telehealth (HOSPITAL_COMMUNITY): Payer: Self-pay | Admitting: *Deleted

## 2023-02-11 ENCOUNTER — Telehealth: Payer: Self-pay

## 2023-02-11 LAB — CBC
HCT: 36.6 % — ABNORMAL LOW (ref 39.0–52.0)
Hemoglobin: 11.8 g/dL — ABNORMAL LOW (ref 13.0–17.0)
MCHC: 32.3 g/dL (ref 30.0–36.0)
MCV: 88.1 fl (ref 78.0–100.0)
Platelets: 285 10*3/uL (ref 150.0–400.0)
RBC: 4.15 Mil/uL — ABNORMAL LOW (ref 4.22–5.81)
RDW: 16.7 % — ABNORMAL HIGH (ref 11.5–15.5)
WBC: 5.3 10*3/uL (ref 4.0–10.5)

## 2023-02-11 LAB — BASIC METABOLIC PANEL
BUN: 128 mg/dL (ref 6–23)
CO2: 27 mEq/L (ref 19–32)
Calcium: 9.3 mg/dL (ref 8.4–10.5)
Chloride: 99 mEq/L (ref 96–112)
Creatinine, Ser: 4.14 mg/dL — ABNORMAL HIGH (ref 0.40–1.50)
GFR: 12.5 mL/min — CL (ref >60.00)
Glucose, Bld: 109 mg/dL — ABNORMAL HIGH (ref 70–99)
Potassium: 4.6 mEq/L (ref 3.5–5.1)
Sodium: 138 mEq/L (ref 135–145)

## 2023-02-11 NOTE — Telephone Encounter (Signed)
Labs mildly worse when compared with results at hospital discharged.  He needs to monitor for decreased urine output, worsening SOB, and MS changes among some. Continue low-salt diet, adequate hydration, and avoidance of NSAIDs. We planned on repeating BMP after starting torsemide. Please fax lab results to his nephrologist's office. Thanks, BJ

## 2023-02-11 NOTE — Telephone Encounter (Signed)
Spoke with [name]. [name] is continuing to improve they declined home health as they said they do not think [name] needs home health at this time. Will continue to hold exercise at cardiac rehab as the patient will need  MD clearance to return. Harrell Gave RN BSN

## 2023-02-11 NOTE — Telephone Encounter (Signed)
I spoke with patient & his wife. They are aware of results below & verbalized understanding. Lab appt made for 3/19 at 1pm. Labs sent to [facility].

## 2023-02-11 NOTE — Telephone Encounter (Signed)
CRITICAL VALUE STICKER  CRITICAL VALUE: BUN 128, GFR 12.50  RECEIVER (on-site recipient of call): Judson Roch, CMA  DATE & TIME NOTIFIED: 1130AM 02/11/23  MESSENGER (representative from lab):Sal  MD NOTIFIED: Yes  TIME OF NOTIFICATION: 1133Am  RESPONSE: MD aware, patient declined going to hospital and declined dialysis. Has f/u with his kidney specialist in about 2 weeks.

## 2023-02-13 ENCOUNTER — Encounter (HOSPITAL_COMMUNITY): Payer: Medicare Other

## 2023-02-16 ENCOUNTER — Ambulatory Visit: Payer: Medicare Other | Admitting: Cardiovascular Disease

## 2023-02-16 ENCOUNTER — Ambulatory Visit: Payer: Self-pay

## 2023-02-16 ENCOUNTER — Encounter (HOSPITAL_COMMUNITY): Payer: Medicare Other

## 2023-02-16 NOTE — Patient Instructions (Signed)
Visit Information  Thank you for taking time to visit with me today. Please don't hesitate to contact me if I can be of assistance to you.   Following are the goals we discussed today:   Goals Addressed             This Visit's Progress    Kidney Disease Management       Patient Goals/Self Care Activities: -Patient/Caregiver will self-administer medications as prescribed as evidenced by self-report/primary caregiver report  -Patient/Caregiver will attend all scheduled provider appointments as evidenced by clinician review of documented attendance to scheduled appointments and patient/caregiver report -Patient/Caregiver will call provider office for new concerns or questions as evidenced by review of documented incoming telephone call notes and patient report  -Weigh daily and record (notify MD with 3 lb weight gain over night or 5 lb in a week) -Adhere to low sodium diet  Interventions Today    Flowsheet Row Most Recent Value  Chronic Disease   Chronic disease during today's visit Chronic Kidney Disease/End Stage Renal Disease (ESRD)  General Interventions   General Interventions Discussed/Reviewed General Interventions Discussed, Doctor Visits  Doctor Visits Discussed/Reviewed Doctor Visits Discussed, Specialist  [Patient to see Kidney doctor next [place]  Education Interventions   Education Provided Provided Education  Provided Verbal Education On Medication, Nutrition, When to see the doctor, Other  [Watching for swelling, shortness of breath, watching salt intake.]  Nutrition Interventions   Nutrition Discussed/Reviewed Decreasing salt  Pharmacy Interventions   Pharmacy Dicussed/Reviewed Pharmacy Topics Discussed, Medications and their functions             Our next appointment is by telephone on 03/03/23 at 1200  Please call the care guide team at [phone] if you need to cancel or reschedule your appointment.   If you are experiencing a Mental Health or [place] or need someone to talk to, please call the Suicide and Crisis Lifeline: 988   Patient verbalizes understanding of instructions and care plan provided today and agrees to view in [place]. Active MyChart status and patient understanding of how to access instructions and care plan via MyChart confirmed with patient.     The patient has been provided with contact information for the care management team and has been advised to call with any health related questions or concerns.   Jone Baseman, RN, MSN [place] [facility] Care Management Coordinator Direct Line [phone]

## 2023-02-17 ENCOUNTER — Telehealth: Payer: Self-pay

## 2023-02-17 ENCOUNTER — Other Ambulatory Visit (INDEPENDENT_AMBULATORY_CARE_PROVIDER_SITE_OTHER): Payer: Medicare Other

## 2023-02-17 DIAGNOSIS — N179 Acute kidney failure, unspecified: Secondary | ICD-10-CM | POA: Diagnosis not present

## 2023-02-17 DIAGNOSIS — I10 Essential (primary) hypertension: Secondary | ICD-10-CM | POA: Diagnosis not present

## 2023-02-17 DIAGNOSIS — N189 Chronic kidney disease, unspecified: Secondary | ICD-10-CM

## 2023-02-17 LAB — BASIC METABOLIC PANEL
BUN: 161 mg/dL (ref 6–23)
CO2: 27 mEq/L (ref 19–32)
Calcium: 9.6 mg/dL (ref 8.4–10.5)
Chloride: 95 mEq/L — ABNORMAL LOW (ref 96–112)
Creatinine, Ser: 4.9 mg/dL (ref 0.40–1.50)
GFR: 10.21 mL/min — CL (ref >60.00)
Glucose, Bld: 100 mg/dL — ABNORMAL HIGH (ref 70–99)
Potassium: 4.4 mEq/L (ref 3.5–5.1)
Sodium: 137 mEq/L (ref 135–145)

## 2023-02-17 NOTE — Telephone Encounter (Signed)
CRITICAL VALUE STICKER  CRITICAL VALUE: BUN 161, Creat 4.90, GFR 10.21  RECEIVER (on-site recipient of call): Judson Roch, CMA  DATE & TIME NOTIFIED: 02/17/23 445PM  MESSENGER (representative from lab): Esperanza Richters  MD NOTIFIED: Yes  TIME OF NOTIFICATION: 02/17/23 446PM  RESPONSE:

## 2023-02-18 ENCOUNTER — Encounter (HOSPITAL_COMMUNITY): Payer: Medicare Other

## 2023-02-18 ENCOUNTER — Encounter (HOSPITAL_COMMUNITY): Payer: Self-pay | Admitting: *Deleted

## 2023-02-18 ENCOUNTER — Telehealth (HOSPITAL_COMMUNITY): Payer: Self-pay | Admitting: *Deleted

## 2023-02-18 DIAGNOSIS — I5022 Chronic systolic (congestive) heart failure: Secondary | ICD-10-CM

## 2023-02-18 NOTE — Progress Notes (Signed)
Discharge Progress Report  Patient Details  Name: Douglas Matthews MRN: [id number] Date of Birth: [birth year] Referring Provider:   Flowsheet Row CARDIAC REHAB PHASE II ORIENTATION [facility]20[facility]  Referring Provider Leodis SiasPhillip Nahser, MD        Number of Visits: 19   Reason for Discharge:  Early Exit:  Patient did not return after  hospitalization  Smoking History:  Social History   Tobacco Use  Smoking Status Never  Smokeless Tobacco Never    Diagnosis:  Heart failure, chronic systolic (HCC)  ADL UCSD:   Initial Exercise Prescription:  Initial Exercise Prescription - 12/24/22 1400       Date of Initial Exercise RX and Referring Provider   Date 12/24/22    Referring Provider Leodis SiasPhillip Nahser, MD    Expected Discharge Date 02/23/23      NuStep   Level 1    SPM 70    Minutes 25    METs 1.5      Prescription Details   Frequency (times per week) 3    Duration Progress to 30 minutes of continuous aerobic without signs/symptoms of physical distress      Intensity   THRR 40-80% of Max Heartrate 54-108    Ratings of Perceived Exertion 11-13    Perceived Dyspnea 0-4      Progression   Progression Continue to progress workloads to maintain intensity without signs/symptoms of physical distress.      Resistance Training   Training Prescription Yes    Weight 2    Reps 10-15             Discharge Exercise Prescription (Final Exercise Prescription Changes):  Exercise Prescription Changes - 01/16/23 1621       Response to Exercise   Blood Pressure (Admit) 130/72    Blood Pressure (Exercise) 108/60    Blood Pressure (Exit) 120/80    Heart Rate (Admit) 81 bpm    Heart Rate (Exercise) 108 bpm    Heart Rate (Exit) 90 bpm    Rating of Perceived Exertion (Exercise) 11    Perceived Dyspnea (Exercise) 0    Symptoms none    Comments Reviewed MET, goals and home Ex    Duration Progress to 30  minutes of  aerobic without signs/symptoms of physical distress    Intensity THRR unchanged      Progression   Progression Continue to progress workloads to maintain intensity without signs/symptoms of physical distress.    Average METs 2.2      Resistance Training   Training Prescription Yes    Weight 2    Reps 10-15    Time 10 Minutes      NuStep   Level 1    SPM 80    Minutes 30    METs 2.2             Functional Capacity:  6 Minute Walk     Row Name 12/24/22 1420         6 Minute Walk   Phase Initial     Distance 630 feet     Walk Time 6 minutes     # of Rest Breaks 2  break from 1:15-1:50, 3:10-4:30 due to high HR     MPH 1.19     METS 1.5     RPE 11     Perceived Dyspnea  0     VO2 Peak 5.39  Symptoms No     Resting HR 84 bpm     Resting BP 116/64     Resting Oxygen Saturation  100 %     Exercise Oxygen Saturation  during 6 min walk 97 %     Max Ex. HR 129 bpm     Max Ex. BP 138/64     2 Minute Post BP 128/68              Psychological, QOL, Others - Outcomes: PHQ 2/9:    02/16/2023   11:54 AM 12/24/2022    2:19 PM 07/08/2022    2:41 PM 06/10/2022    8:55 AM 12/11/2021   12:24 PM  Depression screen PHQ 2/9  Decreased Interest 0 0 0 0 0  Down, Depressed, Hopeless 0 0 0 0 0  PHQ - 2 Score 0 0 0 0 0  Altered sleeping  0     Tired, decreased energy  1     Change in appetite  0     Feeling bad or failure about yourself   0     Trouble concentrating  0     Moving slowly or fidgety/restless  0     Suicidal thoughts  0     PHQ-9 Score  1     Difficult doing work/chores  Not difficult at all       Quality of Life:  Quality of Life - 12/24/22 1432       Quality of Life   Select Quality of Life      Quality of Life Scores   Health/Function Pre 26.4 %    Socioeconomic Pre 28.8 %    Psych/Spiritual Pre 29.14 %    Family Pre 27.6 %    GLOBAL Pre 27.56 %             Personal Goals: Goals established at orientation with  interventions provided to work toward goal.  Personal Goals and Risk Factors at Admission - 12/24/22 1449       Core Components/Risk Factors/Patient Goals on Admission   Heart Failure Yes    Intervention Provide a combined exercise and nutrition program that is supplemented with education, support and counseling about heart failure. Directed toward relieving symptoms such as shortness of breath, decreased exercise tolerance, and extremity edema.    Expected Outcomes Improve functional capacity of life;Short term: Attendance in program 2-3 days a week with increased exercise capacity. Reported lower sodium intake. Reported increased fruit and vegetable intake. Reports medication compliance.;Short term: Daily weights obtained and reported for increase. Utilizing diuretic protocols set by physician.;Long term: Adoption of self-care skills and reduction of barriers for early signs and symptoms recognition and intervention leading to self-care maintenance.    Hypertension Yes    Intervention Monitor prescription use compliance.;Provide education on lifestyle modifcations including regular physical activity/exercise, weight management, moderate sodium restriction and increased consumption of fresh fruit, vegetables, and low fat dairy, alcohol moderation, and smoking cessation.    Expected Outcomes Long Term: Maintenance of blood pressure at goal levels.;Short Term: Continued assessment and intervention until BP is < 140/38mm HG in hypertensive participants. < 130/35mm HG in hypertensive participants with diabetes, heart failure or chronic kidney disease.    Lipids Yes    Intervention Provide education and support for participant on nutrition & aerobic/resistive exercise along with prescribed medications to achieve LDL 70mg , HDL >40mg .    Expected Outcomes Short Term: Participant states understanding of desired cholesterol values and is compliant with medications  prescribed. Participant is following exercise  prescription and nutrition guidelines.;Long Term: Cholesterol controlled with medications as prescribed, with individualized exercise RX and with personalized nutrition plan. Value goals: LDL < 70mg , HDL > 40 mg.              Personal Goals Discharge:  Goals and Risk Factor Review     Row Name 01/27/23 1131             Core Components/Risk Factors/Patient Goals Review   Personal Goals Review Weight Management/Obesity;Hypertension;Lipids       Review Storme has been doing well with exercise at intensive cardiac rehab. Susa Day has lost 5.2 since starting cardiac rehab. Vital sings have been stable       Expected Outcomes Jasias will continue to participate in intensive cardiac rehab for exercise, nutrition and lifestyle modifciatons                Exercise Goals and Review:  Exercise Goals     Row Name 12/31/22 1633             Exercise Goals   Increase Physical Activity Yes       Intervention Provide advice, education, support and counseling about physical activity/exercise needs.;Develop an individualized exercise prescription for aerobic and resistive training based on initial evaluation findings, risk stratification, comorbidities and participant's personal goals.       Expected Outcomes Short Term: Attend rehab on a regular basis to increase amount of physical activity.;Long Term: Exercising regularly at least 3-5 days a week.;Long Term: Add in home exercise to make exercise part of routine and to increase amount of physical activity.       Increase Strength and Stamina Yes       Intervention Provide advice, education, support and counseling about physical activity/exercise needs.;Develop an individualized exercise prescription for aerobic and resistive training based on initial evaluation findings, risk stratification, comorbidities and participant's personal goals.       Expected Outcomes Short Term: Perform resistance training exercises routinely during rehab and add in  resistance training at home;Short Term: Increase workloads from initial exercise prescription for resistance, speed, and METs.;Long Term: Improve cardiorespiratory fitness, muscular endurance and strength as measured by increased METs and functional capacity (6MWT)       Able to understand and use rate of perceived exertion (RPE) scale Yes       Intervention Provide education and explanation on how to use RPE scale       Expected Outcomes Short Term: Able to use RPE daily in rehab to express subjective intensity level;Long Term:  Able to use RPE to guide intensity level when exercising independently       Knowledge and understanding of Target Heart Rate Range (THRR) Yes       Intervention Provide education and explanation of THRR including how the numbers were predicted and where they are located for reference       Expected Outcomes Short Term: Able to state/look up THRR;Long Term: Able to use THRR to govern intensity when exercising independently;Short Term: Able to use daily as guideline for intensity in rehab       Understanding of Exercise Prescription Yes       Intervention Provide education, explanation, and written materials on patient's individual exercise prescription       Expected Outcomes Short Term: Able to explain program exercise prescription;Long Term: Able to explain home exercise prescription to exercise independently  Exercise Goals Re-Evaluation:  Exercise Goals Re-Evaluation     Row Name 12/31/22 1633 01/16/23 1623           Exercise Goal Re-Evaluation   Exercise Goals Review Increase Physical Activity;Understanding of Exercise Prescription;Increase Strength and Stamina;Knowledge and understanding of Target Heart Rate Range (THRR);Able to understand and use rate of perceived exertion (RPE) scale Increase Physical Activity;Understanding of Exercise Prescription;Increase Strength and Stamina;Knowledge and understanding of Target Heart Rate Range (THRR);Able  to understand and use rate of perceived exertion (RPE) scale      Comments Pt first day in CRP2 program. Pt tolerated exercise well with an average MET level of 2.0, no s/sx. Pt is learning his THRR, RPE scale, and exrx. Reviewed MET's, goals and home ExRx. Pt tolerated exercise well with an average MET level of 2.2 and has now increased from 25-30 mins on teh Nustep. Pt will exercise by walking 1-2 days for 10-30 mins as tolerated. Pt feels good about his goals of gaining, strength, stamina and adding flavor with his diet.      Expected Outcomes Will continue to monitor and progress workloads as tolerated without s/sx. Pt will add in 1-2 days of exercise. Will continue to monitor and progress workloads as tolerated without s/sx.               Nutrition & Weight - Outcomes:  Pre Biometrics - 12/24/22 1418       Pre Biometrics   Waist Circumference 42 inches    Hip Circumference 39 inches    Waist to Hip Ratio 1.08 %    Triceps Skinfold 10 mm    % Body Fat 25.9 %    Grip Strength 30 kg    Flexibility 10.5 in    Single Leg Stand 3.75 seconds              Nutrition:  Nutrition Therapy & Goals - 02/05/23 1601       Nutrition Therapy   Diet Heart Healthy/Renal diet      Personal Nutrition Goals   Nutrition Goal Patient to identify strategies for improving cardiovascular by attending the weekly Pritikin education and nutrition series    Personal Goal #2 Patient to identify food sources and limit daily intake of saturated fats, sodium, refined carbohydrates, and trans fats    Personal Goal #3 Patient to reduce sodium to 1500mg  per day.    Personal Goal #4 Patient to identify strategies for weight gain of 0.5-2.0#.    Comments Mr. Graham has not attended cardiac rehab since 01/21/2023 due to hospitalization on 2/27-3/1 and curent hospitalization 3/5 to present related to hyperkalemia. Unable to review goals due to absences.      Intervention Plan   Intervention Prescribe,  educate and counsel regarding individualized specific dietary modifications aiming towards targeted core components such as weight, hypertension, lipid management, diabetes, heart failure and other comorbidities.;Nutrition handout(s) given to patient.    Expected Outcomes Short Term Goal: Understand basic principles of dietary content, such as calories, fat, sodium, cholesterol and nutrients.;Long Term Goal: Adherence to prescribed nutrition plan.             Nutrition Discharge:  Nutrition Assessments - 01/02/23 0846       Rate Your Plate Scores   Pre Score 61             Education Questionnaire Score:  Knowledge Questionnaire Score - 12/24/22 1448       Knowledge Questionnaire Score   Pre Score 21/24  Nicolaos attended 19 exercise sessions between 12/24/22 - 01/21/23. Fayrene FearingJames did well with exercise when in attendance and increased his workloads. Fayrene FearingJames did not return to cardiac rehab after being hospitalized. Fayrene FearingJames enjoyed participating in the program while in attendance.Thayer HeadingsMaria Walden Tichina Koebel RN BSN

## 2023-02-18 NOTE — Telephone Encounter (Signed)
See result note, provider spoke with patient.

## 2023-02-18 NOTE — Telephone Encounter (Signed)
Spoke with Mr Douglas Matthews will discharge from cardiac rehab due to ongoing medical issues.Harrell Gave RN BSN

## 2023-02-20 ENCOUNTER — Telehealth: Payer: Self-pay | Admitting: Family Medicine

## 2023-02-20 ENCOUNTER — Encounter (HOSPITAL_COMMUNITY): Payer: Medicare Other

## 2023-02-20 NOTE — Telephone Encounter (Signed)
Spouse called to say that since Pt started taking the  torsemide (DEMADEX) 100 MG tablet, he has been losing a pound a day and she is growing concerned.  Spouse would like a call back asap to discuss.

## 2023-02-22 NOTE — Telephone Encounter (Signed)
Called and spoke with Douglas Matthews and Douglas Matthews in regard to wt loss. His wife is concerned because he is losing  about 1 Lb daily in average since he started Torsemide. In general he is feeling well, he has not had SOB,orthopnea,PND,CP,or edema. He is having a good urine output. He has an appt with his nephrologist tomorrow morning. His wife is concerned about Torsemide dose, which recently was decreased from 120 mg to 100 mg. He had a fall yesterday when he was coming out of the bathroom, his wife thinks he turned to fast and lost balance. EMS evaluated pt at home and "every thing was normal", he is not having any pain. He has been out shopping and working some outdoors, not alone. His wife is planning on arranging appt with his cardiologist, his next appt is in 04/2023. No changes for now. Tomorrow most likely he is going to have renal function checked. Continue a balance diet with low sodium and adequate protein intake. Continue monitoring wt and activities as tolerated.  Catricia Scheerer [place], MD

## 2023-02-23 DIAGNOSIS — I129 Hypertensive chronic kidney disease with stage 1 through stage 4 chronic kidney disease, or unspecified chronic kidney disease: Secondary | ICD-10-CM | POA: Diagnosis not present

## 2023-02-23 DIAGNOSIS — N179 Acute kidney failure, unspecified: Secondary | ICD-10-CM | POA: Diagnosis not present

## 2023-02-23 DIAGNOSIS — I5082 Biventricular heart failure: Secondary | ICD-10-CM | POA: Diagnosis not present

## 2023-02-23 DIAGNOSIS — N1832 Chronic kidney disease, stage 3b: Secondary | ICD-10-CM | POA: Diagnosis not present

## 2023-02-23 DIAGNOSIS — D638 Anemia in other chronic diseases classified elsewhere: Secondary | ICD-10-CM | POA: Diagnosis not present

## 2023-02-23 DIAGNOSIS — Z91148 Patient's other noncompliance with medication regimen for other reason: Secondary | ICD-10-CM | POA: Diagnosis not present

## 2023-02-23 DIAGNOSIS — E875 Hyperkalemia: Secondary | ICD-10-CM | POA: Diagnosis not present

## 2023-02-23 DIAGNOSIS — E785 Hyperlipidemia, unspecified: Secondary | ICD-10-CM | POA: Diagnosis not present

## 2023-02-23 DIAGNOSIS — N2581 Secondary hyperparathyroidism of renal origin: Secondary | ICD-10-CM | POA: Diagnosis not present

## 2023-02-23 NOTE — Telephone Encounter (Signed)
fyi

## 2023-02-23 NOTE — Telephone Encounter (Signed)
Pt wife is calling and pt seen dr Marval Regal today and md recommended pt to be on low salt diet and cut torsemide per wife

## 2023-02-25 LAB — IRON,TIBC AND FERRITIN PANEL
%SAT: 15
Ferritin: 133
Iron: 58
TIBC: 386
UIBC: 328

## 2023-02-25 LAB — BASIC METABOLIC PANEL
BUN: 71 — AB (ref 4–21)
CO2: 29 — AB (ref 13–22)
Chloride: 94 — AB (ref 99–108)
Creatinine: 5.3 — AB (ref 0.6–1.3)
Glucose: 143
Potassium: 4 mEq/L (ref 3.5–5.1)
Sodium: 138 (ref 137–147)

## 2023-02-25 LAB — LAB REPORT - SCANNED
Creatinine, POC: 84 mg/dL
EGFR: 10
Microalb Creat Ratio: 223

## 2023-02-25 LAB — CBC AND DIFFERENTIAL
HCT: 38 — AB (ref 41–53)
Hemoglobin: 12.9 — AB (ref 13.5–17.5)
Neutrophils Absolute: 2.5
Platelets: 226 10*3/uL (ref 150–400)
WBC: 4.5

## 2023-02-25 LAB — COMPREHENSIVE METABOLIC PANEL
Albumin: 4.3 (ref 3.5–5.0)
Calcium: 10.1 (ref 8.7–10.7)
eGFR: 10

## 2023-02-25 LAB — CBC: RBC: 4.52 (ref 3.87–5.11)

## 2023-02-25 LAB — PROTEIN / CREATININE RATIO, URINE: Creatinine, Urine: 84

## 2023-02-25 LAB — VITAMIN D 25 HYDROXY (VIT D DEFICIENCY, FRACTURES): Vit D, 25-Hydroxy: 50.4

## 2023-03-03 ENCOUNTER — Ambulatory Visit: Payer: Self-pay

## 2023-03-03 ENCOUNTER — Encounter: Payer: Self-pay | Admitting: Nephrology

## 2023-03-03 DIAGNOSIS — N1832 Chronic kidney disease, stage 3b: Secondary | ICD-10-CM | POA: Diagnosis not present

## 2023-03-03 NOTE — Patient Instructions (Signed)
Visit Information  Thank you for taking time to visit with me today. Please don't hesitate to contact me if I can be of assistance to you.   Following are the goals we discussed today:   Goals Addressed             This Visit's Progress    Kidney Disease Management       Patient Goals/Self Care Activities: -Patient/Caregiver will self-administer medications as prescribed as evidenced by self-report/primary caregiver report  -Patient/Caregiver will attend all scheduled provider appointments as evidenced by clinician review of documented attendance to scheduled appointments and patient/caregiver report -Patient/Caregiver will call provider office for new concerns or questions as evidenced by review of documented incoming telephone call notes and patient report  -Weigh daily and record (notify MD with 3 lb weight gain over night or 5 lb in a week) -Adhere to low sodium diet  Weight today 123 lbs. Interventions Today    Flowsheet Row Most Recent Value  Chronic Disease   Chronic disease during today's visit Chronic Kidney Disease/End Stage Renal Disease (ESRD)  General Interventions   General Interventions Discussed/Reviewed General Interventions Reviewed, Doctor Visits  Doctor Visits Discussed/Reviewed Doctor Visits Discussed, Specialist  PCP/Specialist Visits Compliance with follow-up visit  [patient seen by kidney doctor recently.  Returning today for repeat labs.]  Education Interventions   Education Provided Provided Education  Provided Verbal Education On Nutrition  [low salt diet, daiiy weights, swelling and shortness of breath.]  Nutrition Interventions   Nutrition Discussed/Reviewed Nutrition Discussed, Decreasing salt  Pharmacy Interventions   Pharmacy Dicussed/Reviewed Medications and their functions             Our next appointment is by telephone on 4/22/4 at 1130  Please call the care guide team at [phone] if you need to cancel or reschedule your  appointment.   If you are experiencing a Mental Health or [place] or need someone to talk to, please call the Suicide and Crisis Lifeline: 988   The patient verbalized understanding of instructions, educational materials, and care plan provided today and agreed to receive a mailed copy of patient instructions, educational materials, and care plan.   The patient has been provided with contact information for the care management team and has been advised to call with any health related questions or concerns.   Jone Baseman, RN, MSN [place] [facility] Care Management Coordinator Direct Line [phone]

## 2023-03-03 NOTE — Patient Outreach (Signed)
  Care Coordination   Follow Up Visit Note   03/03/2023 Name: Douglas Matthews MRN: [id number] DOB: 06/02/1935  Douglas Matthews is a 88 y.o. year old male who sees [place], Malka So, MD for primary care. I spoke with  Colin Ina by phone today.  What matters to the patients health and wellness today?  Low salt diet    Goals Addressed             This Visit's Progress    Kidney Disease Management       Patient Goals/Self Care Activities: -Patient/Caregiver will self-administer medications as prescribed as evidenced by self-report/primary caregiver report  -Patient/Caregiver will attend all scheduled provider appointments as evidenced by clinician review of documented attendance to scheduled appointments and patient/caregiver report -Patient/Caregiver will call provider office for new concerns or questions as evidenced by review of documented incoming telephone call notes and patient report  -Weigh daily and record (notify MD with 3 lb weight gain over night or 5 lb in a week) -Adhere to low sodium diet  Weight today 123 lbs. Interventions Today    Flowsheet Row Most Recent Value  Chronic Disease   Chronic disease during today's visit Chronic Kidney Disease/End Stage Renal Disease (ESRD)  General Interventions   General Interventions Discussed/Reviewed General Interventions Reviewed, Doctor Visits  Doctor Visits Discussed/Reviewed Doctor Visits Discussed, Specialist  PCP/Specialist Visits Compliance with follow-up visit  [patient seen by kidney doctor recently.  Returning today for repeat labs.]  Education Interventions   Education Provided Provided Education  Provided Verbal Education On Nutrition  [low salt diet, daiiy weights, swelling and shortness of breath.]  Nutrition Interventions   Nutrition Discussed/Reviewed Nutrition Discussed, Decreasing salt  Pharmacy Interventions   Pharmacy Dicussed/Reviewed Medications and their functions             SDOH  assessments and interventions completed:  Yes     Care Coordination Interventions:  Yes, provided   Follow up plan: Follow up call scheduled for April 22/24    Encounter Outcome:  Pt. Visit Completed   Jone Baseman, RN, MSN [place] [facility] Care Management Coordinator Direct Line [phone]

## 2023-03-21 ENCOUNTER — Other Ambulatory Visit: Payer: Self-pay | Admitting: Family Medicine

## 2023-03-23 ENCOUNTER — Telehealth: Payer: Self-pay | Admitting: Cardiovascular Disease

## 2023-03-23 ENCOUNTER — Ambulatory Visit: Payer: Self-pay

## 2023-03-23 NOTE — Telephone Encounter (Signed)
Called patient back about message. Went over what medications patient is on and updated his list. Patient stated imdur had been causing him to itch and he wants to see about stopping it. Patient stated the itching has been going on since he started Imdur. Patient vital signs are stable. BP 114/80. Patient has no other concerns. Will forward to Dr. Elease Hashimoto for advisement.

## 2023-03-23 NOTE — Patient Instructions (Signed)
Visit Information  Thank you for taking time to visit with me today. Please don't hesitate to contact me if I can be of assistance to you.   Following are the goals we discussed today:   Goals Addressed             This Visit's Progress    Kidney Disease Management       Patient Goals/Self Care Activities: -Patient/Caregiver will self-administer medications as prescribed as evidenced by self-report/primary caregiver report  -Patient/Caregiver will attend all scheduled provider appointments as evidenced by clinician review of documented attendance to scheduled appointments and patient/caregiver report -Patient/Caregiver will call provider office for new concerns or questions as evidenced by review of documented incoming telephone call notes and patient report  -Weigh daily and record (notify MD with 3 lb weight gain over night or 5 lb in a week) -Adhere to low sodium diet  Weight today 130 lbs. Interventions Today    Flowsheet Row Most Recent Value  Chronic Disease   Chronic disease during today's visit Chronic Kidney Disease/End Stage Renal Disease (ESRD)  General Interventions   General Interventions Discussed/Reviewed General Interventions Reviewed, Doctor Visits  Doctor Visits Discussed/Reviewed Doctor Visits Discussed, Specialist  Exercise Interventions   Exercise Discussed/Reviewed Exercise Discussed  Education Interventions   Education Provided Provided Education  Provided Verbal Education On Nutrition, Other  [low salt diet, weights and exercise]  Nutrition Interventions   Nutrition Discussed/Reviewed Nutrition Discussed, Decreasing salt  Pharmacy Interventions   Pharmacy Dicussed/Reviewed Medications and their functions             Our next appointment is by telephone on 04/20/23 at 1130  Please call the care guide team at [phone] if you need to cancel or reschedule your appointment.   If you are experiencing a Mental Health or Behavioral Health Crisis or  need someone to talk to, please call the Suicide and Crisis Lifeline: 988   Patient verbalizes understanding of instructions and care plan provided today and agrees to view in MyChart. Active MyChart status and patient understanding of how to access instructions and care plan via MyChart confirmed with patient.     The patient has been provided with contact information for the care management team and has been advised to call with any health related questions or concerns.   Bary Leriche, RN, MSN [facility] Care Management Care Management Coordinator Direct Line [phone]

## 2023-03-23 NOTE — Telephone Encounter (Signed)
Pt c/o medication issue:  1. Name of Medication: isosorbide mononitrate (IMDUR) 30 MG 24 hr tablet   2. How are you currently taking this medication (dosage and times per day)? As prescribed   3. Are you having a reaction (difficulty breathing--STAT)? Yes   4. What is your medication issue? Patient states that this medication is making him itch and he would like a call back to discuss.

## 2023-03-23 NOTE — Patient Outreach (Signed)
  Care Coordination   Follow Up Visit Note   03/23/2023 Name: Douglas Matthews MRN: [id number] DOB: September 18, 1935  Douglas Matthews is a 88 y.o. year old male who sees [place], Timoteo Expose, MD for primary care. I spoke with  [name] by phone today. Patient doing well.  Getting more activity in. Continues to monitor weight.   What matters to the patients health and wellness today?  Health Management    Goals Addressed             This Visit's Progress    Kidney Disease Management       Patient Goals/Self Care Activities: -Patient/Caregiver will self-administer medications as prescribed as evidenced by self-report/primary caregiver report  -Patient/Caregiver will attend all scheduled provider appointments as evidenced by clinician review of documented attendance to scheduled appointments and patient/caregiver report -Patient/Caregiver will call provider office for new concerns or questions as evidenced by review of documented incoming telephone call notes and patient report  -Weigh daily and record (notify MD with 3 lb weight gain over night or 5 lb in a week) -Adhere to low sodium diet  Weight today 130 lbs. Interventions Today    Flowsheet Row Most Recent Value  Chronic Disease   Chronic disease during today's visit Chronic Kidney Disease/End Stage Renal Disease (ESRD)  General Interventions   General Interventions Discussed/Reviewed General Interventions Reviewed, Doctor Visits  Doctor Visits Discussed/Reviewed Doctor Visits Discussed, Specialist  Exercise Interventions   Exercise Discussed/Reviewed Exercise Discussed  Education Interventions   Education Provided Provided Education  Provided Verbal Education On Nutrition, Other  [low salt diet, weights and exercise]  Nutrition Interventions   Nutrition Discussed/Reviewed Nutrition Discussed, Decreasing salt  Pharmacy Interventions   Pharmacy Dicussed/Reviewed Medications and their functions             SDOH  assessments and interventions completed:  Yes     Care Coordination Interventions:  Yes, provided   Follow up plan: Follow up call scheduled for May    Encounter Outcome:  Pt. Visit Completed   Bary Leriche, RN, MSN [facility] Care Management Care Management Coordinator Direct Line [phone]

## 2023-03-24 NOTE — Telephone Encounter (Signed)
Nahser, Deloris Ping, MD  Ethelda Chick, [place] hours ago (7:50 PM)   OK to stop IMdur since it seems to be causing itching  PN   Returned call to patient and advised that he can stop the imdur due to itching, but did encourage him to reach out to pharmacy to see if they would be willing to order a different company's generic as it could be a filler in the medication that he's allergic to. Will remove from med list at this time and add back later if he does pursue an alternative manufacturer.

## 2023-03-25 ENCOUNTER — Encounter: Payer: Self-pay | Admitting: Family Medicine

## 2023-03-25 NOTE — Telephone Encounter (Signed)
I spoke with patient's wife and patient. He has plenty of Torsemide and is taking 50 mg daily now.

## 2023-03-25 NOTE — Telephone Encounter (Signed)
He is not taking Torsemide 20 mg and I believe his nephrologist decreased torsemide dose. Can you please call patient to verify current dose. Thanks, BJ

## 2023-04-01 ENCOUNTER — Telehealth: Payer: Self-pay | Admitting: Family Medicine

## 2023-04-01 NOTE — Telephone Encounter (Signed)
Pt's spouse called to ask MD "what stage of kidney disease Pt has"?  Spouse states she has questions and would like a call back to discuss.

## 2023-04-03 NOTE — Progress Notes (Unsigned)
HPI: Douglas Matthews is a 88 y.o. male, who is here today with his wife to follow on recent visit. His wife has a few questions about medications and CKD.  He follows with nephrologist, last seen on 03/03/23. His e GFR has not returned to his baseline since hospitalization in 10/2022. Currently he is on Torsemide 50 mg daily, has gradually decreased from 120 mg daily. His e GFR has been 16-20, last one 10. He is reporting good urine output, denies gross hematuria and foam in urine.  His wife is also concerned about him taking "too many medications" and potential risk for side effects.  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 He mentions feeling generally good, without experiencing shortness of breath, orthopnea, PND, or edema. He still engages in chores around the house; However, he experiences fluctuations in his ability to perform physical activities due to fatigue, gets tired easily and has to take frequent breaks during the day.    Atrial fib: He is on Eliquis 2.5 mg bid. He has been experiencing pruritus, particularly in his shoulders and neck, which he attributes to Eliquis.  His blood pressure readings 108-120/70-80's. Imdur 30 mg was decreased to 1/2 tab. Lab Results  Component Value Date   ALT 64 (H) 02/05/2023   AST 40 02/05/2023   ALKPHOS 131 (H) 02/05/2023   BILITOT 0.9 02/05/2023    Regarding his diet, he consumes protein sources like chicken and fish and drinks Ensure daily. His wife is concerned about him not gaining any wt. She is interested in a nutrition program at [facility] to help plan meals suitable for kidney and heart conditions. She is frustrated about many food restrictions due to his comorbidities. He eats cereal for breakfast.  His wt has been stable, he is weighing daily. 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  His wife is also concerned about  elevated glucose, 143 in 01/2023. No hx of diabetes. Negative for polydipsia,polyuria, or polyphagia.  Review of Systems  Constitutional:  Positive for activity change. Negative for chills and fever.  HENT:  Negative for mouth sores and sore throat.   Respiratory:  Negative for cough and wheezing.   Gastrointestinal:  Negative for abdominal pain, nausea and vomiting.  Endocrine: Negative for cold intolerance and heat intolerance.  Musculoskeletal:  Negative for gait problem and myalgias.  Skin:  Negative for rash.  Neurological:  Negative for syncope, weakness and headaches.  See other pertinent positives and negatives in HPI.  Current Outpatient Medications on File Prior to Visit  Medication Sig Dispense Refill   apixaban (ELIQUIS) 2.5 MG TABS tablet Take 1 tablet (2.5 mg total) by mouth 2 (two) times daily. 180 tablet 1   COD LIVER OIL PO Take 1 tablet by mouth daily.     GARLIC PO Take 1 tablet by mouth daily.     isosorbide mononitrate (IMDUR) 30 MG 24 hr tablet Take 30 mg by mouth daily.     Multiple Vitamin (MULTIVITAMIN) capsule Take 1 capsule by mouth daily.     Saw Palmetto 450 MG CAPS Take 900 mg by mouth daily.     No current facility-administered medications on file prior to visit.   Past Medical History:  Diagnosis Date   Anemia    Atrial fibrillation (HCC)    BPH (benign prostatic hypertrophy)    Chronic kidney disease    Diverticulosis  Hyperlipidemia    Hypertension    No Known Allergies  Social History   Socioeconomic History   Marital status: Married    Spouse name: Not on file   Number of children: Not on file   Years of education: Not on file   Highest education level: Not on file  Occupational History   Occupation: Retired  Tobacco Use   Smoking status: Never   Smokeless tobacco: Never  Vaping Use   Vaping Use: Never used  Substance and Sexual Activity   Alcohol use: No   Drug use: No   Sexual activity: Not on file  Other Topics Concern    Not on file  Social History Narrative   Married x 50 years    Social Determinants of Health   Financial Resource Strain: Low Risk  (07/08/2022)   Overall Financial Resource Strain (CARDIA)    Difficulty of Paying Living Expenses: Not hard at all  Food Insecurity: No Food Insecurity (02/09/2023)   Hunger Vital Sign    Worried About Running Out of Food in the Last Year: Never true    Ran Out of Food in the Last Year: Never true  Transportation Needs: No Transportation Needs (02/16/2023)   PRAPARE - Administrator, Civil Service (Medical): No    Lack of Transportation (Non-Medical): No  Physical Activity: Insufficiently Active (07/08/2022)   Exercise Vital Sign    Days of Exercise per Week: 2 days    Minutes of Exercise per Session: 30 min  Stress: No Stress Concern Present (07/08/2022)   [employer] of Occupational Health - Occupational Stress Questionnaire    Feeling of Stress : Not at all  Social Connections: Moderately Integrated (07/02/2021)   Social Connection and Isolation Panel [NHANES]    Frequency of Communication with Friends and Family: Three times a week    Frequency of Social Gatherings with Friends and Family: Three times a week    Attends Religious Services: More than 4 times per year    Active Member of Clubs or Organizations: No    Attends Banker Meetings: Never    Marital Status: Married   Vitals:   04/06/23 1433  BP: 118/80  Pulse: 94  Resp: 16  SpO2: 99%   Wt Readings from Last 3 Encounters:  04/06/23 131 lb 2 oz (59.5 kg)  02/07/23 132 lb 11.5 oz (60.2 kg)  02/03/23 150 lb 4 oz (68.2 kg)   Body mass index is 19.94 kg/m.  Physical Exam Vitals and nursing note reviewed.  Constitutional:      General: He is not in acute distress.    Appearance: He is well-developed.  HENT:     Head: Normocephalic and atraumatic.     Mouth/Throat:     Mouth: Mucous membranes are moist.     Dentition: Has dentures.  Eyes:      Conjunctiva/sclera: Conjunctivae normal.  Cardiovascular:     Rate and Rhythm: Normal rate. Rhythm irregular.     Pulses:          Dorsalis pedis pulses are 2+ on the right side and 2+ on the left side.     Heart sounds: No murmur heard. Pulmonary:     Effort: Pulmonary effort is normal. No respiratory distress.     Breath sounds: Normal breath sounds.  Abdominal:     Palpations: Abdomen is soft. There is no mass.     Tenderness: There is no abdominal tenderness.  Musculoskeletal:  Right lower leg: No edema.     Left lower leg: No edema.  Skin:    General: Skin is warm.     Findings: No erythema or rash.  Neurological:     Mental Status: He is alert and oriented to person, place, and time.     Cranial Nerves: No cranial nerve deficit.     Gait: Gait normal.  Psychiatric:        Mood and Affect: Mood and affect normal.   ASSESSMENT AND PLAN:  Mr. Darrius was seen today for medical management of chronic issues.  Diagnoses and all orders for this visit: Lab Results  Component Value Date   HGBA1C 6.2 04/06/2023   Prediabetes Assessment & Plan: Today HgA1C was 6.2. Caution with sugar added foods and continue physical activity as tolerated.  Orders: -     POCT glycosylated hemoglobin (Hb A1C)  Atrial fibrillation with RVR (HCC) Assessment & Plan: He thinks Eliquis may be causing skin pruritus, which is localized on shoulders and neck. We discussed other possible options, Xarelto and if renal function does not improve coumadin. Recommend continue Eliquis 2.5 mg bid for now, we discussed benefits vs risk. Will address his concerns with his cardiologist.  Heart failure with reduced ejection fraction [facility]) Assessment & Plan: We discussed Dx,prognosis,and treatment options. Due to renal function, SGLT2 inhibitor is not an option at this time. Torsemide dose decreased from 50 mg to 40 mg and 20 mg alternating daily. Monitor for worsening fatigue, edema,or wt gain, in which  case he was instructed to increase dose back to 50 mg daily. Continue low salt diet and fluid restriction.  Orders: -     Torsemide; Take 20-40 mg by mouth daily.  Dispense: 30 tablet; Refill: 2  Chronic kidney disease, unspecified CKD stage Assessment & Plan: Following with nephrologist. We discussed CKD stages and reviewed labs within the past 6 months. His e GFR and Cr have not gone back to his baseline, cardiorenal synd. Last Cr 5.3, phosphorus 4.6 in 01/2023. Today recommend decreasing Torsemide to 40 mg and can alternate between 40 mg and 20 mg daily. Continue low salt diet and avoiding NSAID's as well as low phosphorus diet.  Orders: -     Torsemide; Take 20-40 mg by mouth daily.  Dispense: 30 tablet; Refill: 2  Skin pruritus No associated rash. Problem just started and he has been on Eliquis for a few months now. Could be related to CKD. His last LFT's in 01/2023 ALT and alkaline phosphatase mildly elevated. Recommend applying moisturizer on affected area. Monitor for new symptoms.  Primary hypertension Assessment & Plan: BP today adequately controlled, having some SBP's in the lower 100's, he recently decrease Imdur from 30 mg to 15 mg. Continue monitoring BP regularly.  I spent a total of 54 minutes in both face to face and non face to face activities for this visit on the date of this encounter. During this time history was obtained and documented, examination was performed, prior labs/imaging reviewed, and assessment/plan discussed.  Return in about 4 months (around 08/07/2023), or if symptoms worsen or fail to improve, for keep next appointment.  Benecio Kluger G. [place], MD  [facility]. [place] office.

## 2023-04-03 NOTE — Telephone Encounter (Signed)
I thought he was already stage 4, [because his e GFR has been < 30 since 10/2022] but reviewed last visit with nephrologist (03/03/23) and according to note, he has CKD 3b with exacerbation due to HF. He has an appt with me in 06/2023, we could meet earlier to discuss lab results and Dx. Thanks, BJ

## 2023-04-03 NOTE — Telephone Encounter (Signed)
I spoke with patient's wife. She wanted to schedule a follow up with PCP, appt made for Monday at 2:30.

## 2023-04-06 ENCOUNTER — Ambulatory Visit (INDEPENDENT_AMBULATORY_CARE_PROVIDER_SITE_OTHER): Payer: Medicare Other | Admitting: Family Medicine

## 2023-04-06 ENCOUNTER — Encounter: Payer: Self-pay | Admitting: Family Medicine

## 2023-04-06 VITALS — BP 118/80 | HR 94 | Resp 16 | Ht 68.0 in | Wt 131.1 lb

## 2023-04-06 DIAGNOSIS — N189 Chronic kidney disease, unspecified: Secondary | ICD-10-CM

## 2023-04-06 DIAGNOSIS — L299 Pruritus, unspecified: Secondary | ICD-10-CM

## 2023-04-06 DIAGNOSIS — I5020 Unspecified systolic (congestive) heart failure: Secondary | ICD-10-CM

## 2023-04-06 DIAGNOSIS — N185 Chronic kidney disease, stage 5: Secondary | ICD-10-CM | POA: Insufficient documentation

## 2023-04-06 DIAGNOSIS — I4891 Unspecified atrial fibrillation: Secondary | ICD-10-CM

## 2023-04-06 DIAGNOSIS — R7303 Prediabetes: Secondary | ICD-10-CM | POA: Diagnosis not present

## 2023-04-06 DIAGNOSIS — I10 Essential (primary) hypertension: Secondary | ICD-10-CM | POA: Diagnosis not present

## 2023-04-06 LAB — POCT GLYCOSYLATED HEMOGLOBIN (HGB A1C): HbA1c, POC (prediabetic range): 6.2 % (ref 5.7–6.4)

## 2023-04-06 MED ORDER — TORSEMIDE 40 MG PO TABS: 20.0000 mg | ORAL_TABLET | Freq: Every day | ORAL | 2 refills | Status: DC

## 2023-04-06 NOTE — Patient Instructions (Addendum)
A few things to remember from today's visit:  Atrial fibrillation with RVR (HCC)  Heart failure with reduced ejection fraction (HCC)  Chronic kidney disease, unspecified CKD stage  An option for Eliquis is Xarelto, please ask cardiologist. Continue adequate hydration. Try to decrease Torsemide from 50 mg to 40 mg and 20 mg every other day alternating. Monitor for shortness of breath or edema or wt gain.  If you need refills for medications you take chronically, please call your pharmacy. Do not use My Chart to request refills or for acute issues that need immediate attention. If you send a my chart message, it may take a few days to be addressed, specially if I am not in the office.  Please be sure medication list is accurate. If a new problem present, please set up appointment sooner than planned today.

## 2023-04-09 ENCOUNTER — Encounter: Payer: Self-pay | Admitting: Family Medicine

## 2023-04-09 NOTE — Assessment & Plan Note (Signed)
Following with nephrologist. We discussed CKD stages and reviewed labs within the past 6 months. His e GFR and Cr have not gone back to his baseline, cardiorenal synd. Last Cr 5.3, phosphorus 4.6 in 01/2023. Today recommend decreasing Torsemide to 40 mg and can alternate between 40 mg and 20 mg daily. Continue low salt diet and avoiding NSAID's as well as low phosphorus diet.

## 2023-04-09 NOTE — Assessment & Plan Note (Signed)
We discussed Dx,prognosis,and treatment options. Due to renal function, SGLT2 inhibitor is not an option at this time. Torsemide dose decreased from 50 mg to 40 mg and 20 mg alternating daily. Monitor for worsening fatigue, edema,or wt gain, in which case he was instructed to increase dose back to 50 mg daily. Continue low salt diet and fluid restriction.

## 2023-04-09 NOTE — Assessment & Plan Note (Signed)
Today HgA1C was 6.2. Caution with sugar added foods and continue physical activity as tolerated.

## 2023-04-09 NOTE — Assessment & Plan Note (Addendum)
He thinks Eliquis may be causing skin pruritus, which is localized on shoulders and neck. We discussed other possible options, Xarelto and if renal function does not improve coumadin. Recommend continue Eliquis 2.5 mg bid for now, we discussed benefits vs risk. Will address his concerns with his cardiologist.

## 2023-04-09 NOTE — Assessment & Plan Note (Signed)
BP today adequately controlled, having some SBP's in the lower 100's, he recently decrease Imdur from 30 mg to 15 mg. Continue monitoring BP regularly.

## 2023-04-20 ENCOUNTER — Encounter: Payer: Self-pay | Admitting: Cardiovascular Disease

## 2023-04-20 ENCOUNTER — Ambulatory Visit: Payer: Self-pay

## 2023-04-20 NOTE — Progress Notes (Unsigned)
Cardiology Office Note:    Date:  04/21/2023   ID:  Douglas Matthews, DOB June 30, 1935, MRN [id number]  PCP:  [place], [name], MD   [facility] Providers Cardiologist:  Kristeen Miss, MD {     Referring MD: [place], [name], MD   Chief Complaint  Patient presents with   Atrial Fibrillation        Congestive Heart Failure          History of Present Illness:    Douglas Matthews is a 88 y.o. male with a hx of  Atrial fib/ flutter, HLD, HTN  I met him in the hospital in Nov. 2023 with atrial flutter, new LBBB ,  Has new Dx of CHF Had successful TEE / CV  Is on eliquis 2.5 BID   Breathing has continued to improve.   Pt is on coreg 12.5 BID , hydralazine 10 mg TId, , Imdur 30 mg a day  Has lasix as needed - has not needed it    Feb. 20, 2024 Complains that he cannot taste his food , not covid related.   Occasionally feels fatigued  Still exercising ,  Goes to rehab.  Echo from Nov, 2023 shows EF 25-30% Repeat echo is scheduled for 2 weeks    He has gone back into atrial fib .  Apr 21, 2023 [name] is seen for follow up of his atrial fib , HTN  Creatinine is 4.3 Difficult to add any ARB or Entresto   On exam he has rapid Afib  Will load with amio Have him return to see an APP in 4 weeks to set up DC cardioversin    Past Medical History:  Diagnosis Date   Anemia    Atrial fibrillation (HCC)    BPH (benign prostatic hypertrophy)    Chronic kidney disease    Diverticulosis    Hyperlipidemia    Hypertension     Past Surgical History:  Procedure Laterality Date   APPENDECTOMY     CARDIOVERSION N/A 10/29/2022   Procedure: CARDIOVERSION;  Surgeon: Lewayne Bunting, MD;  Location[facility] ENDOSCOPY;  Service: Cardiovascular;  Laterality: N/A;   HERNIA REPAIR     TEE WITHOUT CARDIOVERSION N/A 10/29/2022   Procedure: TRANSESOPHAGEAL ECHOCARDIOGRAM (TEE);  Surgeon: Lewayne Bunting, MD;  Location: [facility] ENDOSCOPY;  Service: Cardiovascular;  Laterality:  N/A;    Current Medications: Current Meds  Medication Sig   amiodarone (PACERONE) 200 MG tablet Take one tablet by mouth twice daily for 3 weeks, then take one tablet by mouth once daily.   apixaban (ELIQUIS) 2.5 MG TABS tablet Take 1 tablet (2.5 mg total) by mouth 2 (two) times daily.   COD LIVER OIL PO Take 1 tablet by mouth daily.   GARLIC PO Take 1 tablet by mouth daily.   isosorbide mononitrate (IMDUR) 30 MG 24 hr tablet Take 30 mg by mouth daily.   Multiple Vitamin (MULTIVITAMIN) capsule Take 1 capsule by mouth daily.   Saw Palmetto 450 MG CAPS Take 900 mg by mouth daily.   Torsemide 40 MG TABS Take 20-40 mg by mouth daily.     Allergies:   Patient has no known allergies.   Social History   Socioeconomic History   Marital status: Married    Spouse name: Not on file   Number of children: Not on file   Years of education: Not on file   Highest education level: Not on file  Occupational History   Occupation: Retired  Tobacco Use   Smoking status: Never   Smokeless tobacco: Never  Vaping Use   Vaping Use: Never used  Substance and Sexual Activity   Alcohol use: No   Drug use: No   Sexual activity: Not on file  Other Topics Concern   Not on file  Social History Narrative   Married x 50 years    Social Determinants of Health   Financial Resource Strain: Low Risk  (07/08/2022)   Overall Financial Resource Strain (CARDIA)    Difficulty of Paying Living Expenses: Not hard at all  Food Insecurity: No Food Insecurity (02/09/2023)   Hunger Vital Sign    Worried About Running Out of Food in the Last Year: Never true    Ran Out of Food in the Last Year: Never true  Transportation Needs: No Transportation Needs (02/16/2023)   PRAPARE - Administrator, Civil Service (Medical): No    Lack of Transportation (Non-Medical): No  Physical Activity: Insufficiently Active (07/08/2022)   Exercise Vital Sign    Days of Exercise per Week: 2 days    Minutes of Exercise per  Session: 30 min  Stress: No Stress Concern Present (07/08/2022)   [employer] of Occupational Health - Occupational Stress Questionnaire    Feeling of Stress : Not at all  Social Connections: Moderately Integrated (07/02/2021)   Social Connection and Isolation Panel [NHANES]    Frequency of Communication with Friends and Family: Three times a week    Frequency of Social Gatherings with Friends and Family: Three times a week    Attends Religious Services: More than 4 times per year    Active Member of Clubs or Organizations: No    Attends Banker Meetings: Never    Marital Status: Married     Family History: The patient's family history includes Heart disease in an other family member.  ROS:   Please see the history of present illness.     All other systems reviewed and are negative.  EKGs/Labs/Other Studies Reviewed:    The following studies were reviewed today:    Recent Labs: 10/25/2022: TSH 4.260 02/03/2023: B Natriuretic Peptide 878.0; Pro B Natriuretic peptide (BNP) 1,760.0 02/04/2023: Magnesium 2.4 02/05/2023: ALT 64 02/25/2023: BUN 71; Creatinine 5.3; Hemoglobin 12.9; Platelets 226; Potassium 4.0; Sodium 138  Recent Lipid Panel    Component Value Date/Time   CHOL 259 (H) 05/08/2021 0950   TRIG 87.0 05/08/2021 0950   HDL 59.40 05/08/2021 0950   CHOLHDL 4 05/08/2021 0950   VLDL 17.4 05/08/2021 0950   LDLCALC 182 (H) 05/08/2021 0950   LDLDIRECT 220.5 12/15/2011 1137     Risk Assessment/Calculations:    CHA2DS2-VASc Score = 4   This indicates a 4.8% annual risk of stroke. The patient's score is based upon: CHF History: 1 HTN History: 1 Diabetes History: 0 Stroke History: 0 Vascular Disease History: 0 Age Score: 2 Gender Score: 0         Physical Exam:    Physical Exam: Blood pressure 119/82, pulse (!) 115, height 5\' 8"  (1.727 m), weight 127 lb 6.4 oz (57.8 kg), SpO2 99 %.       GEN:  Well nourished, well developed in no acute  distress HEENT: Normal NECK: No JVD; No carotid bruits LYMPHATICS: No lymphadenopathy CARDIAC: Irregularly irregular, tachycardic. RESPIRATORY:  Clear to auscultation without rales, wheezing or rhonchi  ABDOMEN: Soft, non-tender, non-distended MUSCULOSKELETAL:  No edema; No deformity  SKIN: Warm and dry NEUROLOGIC:  Alert and oriented  x 3    EKG:        ASSESSMENT:    No diagnosis found.   PLAN:       Acute on chronic combined systolic and diastolic congestive heart failure: He has an EF of 20 to 25%.  I suspect that some of this may be due to his rapid atrial fibrillation.  Will try once again to cardiovert him.  Will load him with amiodarone and have him see an APP to arrange cardioversion.  2.  Atrial fibrillation:   He is back in atrial fibrillation today.  His rate is not well-controlled at this point.  Will load him with amiodarone and reattempt cardioversion.  We discussed risk, benefits, options of cardioversion.  He had cardioversions in the past.  He understands and agrees to proceed.    Medication Adjustments/Labs and Tests Ordered: Current medicines are reviewed at length with the patient today.  Concerns regarding medicines are outlined above.  No orders of the defined types were placed in this encounter.  Meds ordered this encounter  Medications   amiodarone (PACERONE) 200 MG tablet    Sig: Take one tablet by mouth twice daily for 3 weeks, then take one tablet by mouth once daily.    Dispense:  120 tablet    Refill:  3    Patient Instructions  Medication Instructions:  Your physician has recommended you make the following change in your medication:  1-START Amiodarone 200 mg by mouth twice daily for 3 weeks, then take 200 mg by mouth once a day.  *If you need a refill on your cardiac medications before your next appointment, please call your pharmacy*  Lab Work: If you have labs (blood work) drawn today and your tests are completely normal, you will  receive your results only by: MyChart Message (if you have MyChart) OR A paper copy in the mail If you have any lab test that is abnormal or we need to change your treatment, we will call you to review the results.  Testing/Procedures: None ordered today.  Follow-Up: At [facility], you and your health needs are our priority.  As part of our continuing mission to provide you with exceptional heart care, we have created designated Provider Care Teams.  These Care Teams include your primary Cardiologist (physician) and Advanced Practice Providers (APPs -  Physician Assistants and Nurse Practitioners) who all work together to provide you with the care you need, when you need it.  We recommend signing up for the patient portal called "MyChart".  Sign up information is provided on this After Visit Summary.  MyChart is used to connect with patients for Virtual Visits (Telemedicine).  Patients are able to view lab/test results, encounter notes, upcoming appointments, etc.  Non-urgent messages can be sent to your provider as well.   To learn more about what you can do with MyChart, go to [email or web address].    Your next appointment:   4 week(s)  Provider:   With PA or NP  to discuss cardioversion.     Signed, Kristeen Miss, MD  04/21/2023 5:12 PM    [facility]

## 2023-04-20 NOTE — Patient Instructions (Signed)
Visit Information  Thank you for taking time to visit with me today. Please don't hesitate to contact me if I can be of assistance to you.   Following are the goals we discussed today:   Goals Addressed             This Visit's Progress    Kidney Disease Management       Patient Goals/Self Care Activities: -Patient/Caregiver will self-administer medications as prescribed as evidenced by self-report/primary caregiver report  -Patient/Caregiver will attend all scheduled provider appointments as evidenced by clinician review of documented attendance to scheduled appointments and patient/caregiver report -Patient/Caregiver will call provider office for new concerns or questions as evidenced by review of documented incoming telephone call notes and patient report  -Weigh daily and record (notify MD with 3 lb weight gain over night or 5 lb in a week) -Adhere to low sodium diet  Weight today 127 lbs. Discussed managing fluid restriction and limiting salt intake.          Our next appointment is by telephone on 05/25/23 at 1130  Please call the care guide team at [phone] if you need to cancel or reschedule your appointment.   If you are experiencing a Mental Health or Behavioral Health Crisis or need someone to talk to, please call the Suicide and Crisis Lifeline: 988   Patient verbalizes understanding of instructions and care plan provided today and agrees to view in MyChart. Active MyChart status and patient understanding of how to access instructions and care plan via MyChart confirmed with patient.     The patient has been provided with contact information for the care management team and has been advised to call with any health related questions or concerns.   Bary Leriche, RN, MSN [facility] Care Management Care Management Coordinator Direct Line [phone]

## 2023-04-20 NOTE — Patient Outreach (Signed)
  Care Coordination   Follow Up Visit Note   04/20/2023 Name: CHESTERFIELD MERCADEL MRN: [id number] DOB: 03/09/1935  MYNOR HILFIKER is a 88 y.o. year old male who sees [place], Timoteo Expose, MD for primary care. I spoke with  [name] by phone today.  What matters to the patients health and wellness today?  Managing health    Goals Addressed             This Visit's Progress    Kidney Disease Management       Patient Goals/Self Care Activities: -Patient/Caregiver will self-administer medications as prescribed as evidenced by self-report/primary caregiver report  -Patient/Caregiver will attend all scheduled provider appointments as evidenced by clinician review of documented attendance to scheduled appointments and patient/caregiver report -Patient/Caregiver will call provider office for new concerns or questions as evidenced by review of documented incoming telephone call notes and patient report  -Weigh daily and record (notify MD with 3 lb weight gain over night or 5 lb in a week) -Adhere to low sodium diet  Weight today 127 lbs. Discussed managing fluid restriction and limiting salt intake.          SDOH assessments and interventions completed:  Yes     Care Coordination Interventions:  Yes, provided   Follow up plan: Follow up call scheduled for June    Encounter Outcome:  Pt. Visit Completed   Bary Leriche, RN, MSN [facility] Care Management Care Management Coordinator Direct Line [phone]

## 2023-04-21 ENCOUNTER — Encounter: Payer: Self-pay | Admitting: Cardiovascular Disease

## 2023-04-21 ENCOUNTER — Ambulatory Visit: Payer: Medicare Other | Attending: Cardiovascular Disease | Admitting: Cardiovascular Disease

## 2023-04-21 VITALS — BP 119/82 | HR 115 | Ht 68.0 in | Wt 127.4 lb

## 2023-04-21 DIAGNOSIS — I4891 Unspecified atrial fibrillation: Secondary | ICD-10-CM | POA: Diagnosis not present

## 2023-04-21 DIAGNOSIS — I5020 Unspecified systolic (congestive) heart failure: Secondary | ICD-10-CM | POA: Diagnosis not present

## 2023-04-21 MED ORDER — AMIODARONE HCL 200 MG PO TABS: ORAL_TABLET | ORAL | 3 refills | Status: DC

## 2023-04-21 NOTE — Patient Instructions (Signed)
Medication Instructions:  Your physician has recommended you make the following change in your medication:  1-START Amiodarone 200 mg by mouth twice daily for 3 weeks, then take 200 mg by mouth once a day.  *If you need a refill on your cardiac medications before your next appointment, please call your pharmacy*  Lab Work: If you have labs (blood work) drawn today and your tests are completely normal, you will receive your results only by: MyChart Message (if you have MyChart) OR A paper copy in the mail If you have any lab test that is abnormal or we need to change your treatment, we will call you to review the results.  Testing/Procedures: None ordered today.  Follow-Up: At [facility], you and your health needs are our priority.  As part of our continuing mission to provide you with exceptional heart care, we have created designated Provider Care Teams.  These Care Teams include your primary Cardiologist (physician) and Advanced Practice Providers (APPs -  Physician Assistants and Nurse Practitioners) who all work together to provide you with the care you need, when you need it.  We recommend signing up for the patient portal called "MyChart".  Sign up information is provided on this After Visit Summary.  MyChart is used to connect with patients for Virtual Visits (Telemedicine).  Patients are able to view lab/test results, encounter notes, upcoming appointments, etc.  Non-urgent messages can be sent to your provider as well.   To learn more about what you can do with MyChart, go to [email or web address].    Your next appointment:   4 week(s)  Provider:   With PA or NP  to discuss cardioversion.

## 2023-05-03 ENCOUNTER — Other Ambulatory Visit: Payer: Self-pay | Admitting: Cardiovascular Disease

## 2023-05-03 ENCOUNTER — Other Ambulatory Visit: Payer: Self-pay | Admitting: Family Medicine

## 2023-05-04 NOTE — Telephone Encounter (Signed)
Prescription refill request for Eliquis received. Indication:afib Last office visit:5/24 Scr:5.3 Age: 88 Weight:57.8  kg  Prescription refilled

## 2023-05-04 NOTE — Telephone Encounter (Signed)
Sarah, pt decided to stop taking Isosorbide a while back, but now looks like PCP is putting him back on. I tried to reach pt, didn't get an answer, so not sure if I should refill medication. Please advise. Thanks

## 2023-05-18 NOTE — Progress Notes (Unsigned)
Office Visit    Patient Name: Douglas Matthews Date of Encounter: 05/19/2023  Primary Care Provider:  [place], [name], MD Primary Cardiologist:  Kristeen Miss, MD Primary Electrophysiologist: None   Past Medical History    Past Medical History:  Diagnosis Date   Anemia    Atrial fibrillation (HCC)    BPH (benign prostatic hypertrophy)    Chronic kidney disease    Diverticulosis    Hyperlipidemia    Hypertension    Past Surgical History:  Procedure Laterality Date   APPENDECTOMY     CARDIOVERSION N/A 10/29/2022   Procedure: CARDIOVERSION;  Surgeon: Lewayne Bunting, MD;  Location:  [facility] ENDOSCOPY;  Service: Cardiovascular;  Laterality: N/A;   HERNIA REPAIR     TEE WITHOUT CARDIOVERSION N/A 10/29/2022   Procedure: TRANSESOPHAGEAL ECHOCARDIOGRAM (TEE);  Surgeon: Lewayne Bunting, MD;  Location: [facility] ENDOSCOPY;  Service: Cardiovascular;  Laterality: N/A;    Allergies  No Known Allergies   History of Present Illness    Douglas Matthews  is a 88 year old male with a PMH of AF (on Eliquis), HTN, HLD, HFrEF, BPH, LBBB, HFpEF, anemia who presents today for 4-week follow-up.  Douglas Matthews was seen initially in 10/2022 when he presented to the ER with complaint of shortness of breath x 2 days due to pneumonia.  EKG was completed showing AF/flutter with RVR and new LBBB.  Patient underwent successful TEE/DCCV and maintaining sinus rhythm postprocedure.  2D echo was completed showing EF of 30-35% with moderate biatrial enlargement with mild RVE and small pericardial effusion with mild MR and TR.  He was discharged with Eliquis for anticoagulation and was discharged with optimize GDMT due to new onset CHF.  He was seen in follow-up 01/20/2023 and was found to be back in atrial fibrillation.  He was readmitted to the hospital 01/27/2023 with acute on chronic renal insufficiency and CHF.    He was consulted by nephrology and patient was diuresed with IV Lasix.  He was last seen by Dr. Elease Hashimoto  04/21/2023 for posthospital follow-up.  He continued to improve with his breathing and creatinine was elevated at baseline of 4.3 with inability to titrate GDMT.  He was found to have rapid AF and was loaded with amiodarone and presents today for follow-up.  Douglas Matthews presents today for 4-week follow-up with his wife.  Since last being seen in the office patient reports that he has been feeling a little better but notes some tiredness with everyday activities such as washing his car.  His blood pressure today is controlled at 100/58 and heart rate was 69 bpm.  He has been compliant with his current medication regimen and does report some ongoing itching since starting Eliquis.  He denies any occult bleeding with his current blood thinner.  During today's visit we discussed the pathophysiology of atrial fibrillation and patient had all questions answered to his satisfaction.  He also notes a poor p.o. intake since being diagnosed with atrial fibrillation.  He is supplementing currently with Ensure shakes.  Patient denies chest pain, palpitations, dyspnea, PND, orthopnea, nausea, vomiting, dizziness, syncope, edema, weight gain, or early satiety.   Home Medications    Current Outpatient Medications  Medication Sig Dispense Refill   COD LIVER OIL PO Take 1 tablet by mouth daily.     ELIQUIS 2.5 MG TABS tablet TAKE 1 TABLET BY MOUTH TWICE A DAY 180 tablet 1   GARLIC PO Take 1 tablet by mouth daily.  isosorbide mononitrate (IMDUR) 30 MG 24 hr tablet TAKE 1 TABLET BY MOUTH EVERY DAY 90 tablet 3   Multiple Vitamin (MULTIVITAMIN) capsule Take 1 capsule by mouth daily.     Saw Palmetto 450 MG CAPS Take 900 mg by mouth daily.     Torsemide 40 MG TABS Take 20-40 mg by mouth daily. 30 tablet 2   No current facility-administered medications for this visit.     Review of Systems  Please see the history of present illness.    (+) Fatigue (+) Palpitations  All other systems reviewed and are otherwise  negative except as noted above.  Physical Exam    Wt Readings from Last 3 Encounters:  05/19/23 127 lb (57.6 kg)  04/21/23 127 lb 6.4 oz (57.8 kg)  04/06/23 131 lb 2 oz (59.5 kg)   VS: Vitals:   05/19/23 1423  BP: (!) 100/58  Pulse: 69  SpO2: 100%  ,Body mass index is 19.31 kg/m.  Constitutional:      Appearance: Healthy appearance. Not in distress.  Neck:     Vascular: JVD normal.  Pulmonary:     Effort: Pulmonary effort is normal.     Breath sounds: No wheezing. No rales. Diminished in the bases Cardiovascular:     Irregularly irregular. Normal S1. Normal S2.      Murmurs: There is no murmur.  Edema:    Peripheral edema absent.  Abdominal:     Palpations: Abdomen is soft non tender. There is no hepatomegaly.  Skin:    General: Skin is warm and dry.  Neurological:     General: No focal deficit present.     Mental Status: Alert and oriented to person, place and time.     Cranial Nerves: Cranial nerves are intact.  EKG/LABS/ Recent Cardiac Studies    ECG personally reviewed by me today -atrial fibrillation with left axis deviation and left bundle branch block with a rate of 89 and no acute changes consistent with previous EKG.  Cardiac Studies & Procedures       ECHOCARDIOGRAM  ECHOCARDIOGRAM COMPLETE 02/02/2023  Narrative ECHOCARDIOGRAM REPORT    Patient Name:   Douglas Matthews Date of Exam: 02/02/2023 Medical Rec #:  [id number]        Height:       68.0 in Accession #:    [id number]       Weight:       136.6 lb Date of Birth:  [birth year]       BSA:          1.738 m Patient Age:    87 years         BP:           126/68 mmHg Patient Gender: M                HR:           116 bpm. Exam Location:  [address]  Procedure: 2D Echo, Cardiac Doppler, Color Doppler and 3D Echo  Indications:    I50.42 Chronic combined systolic (congestive) and diastolic (congestive) heart failure  History:        Patient has prior history of Echocardiogram examinations,  most recent 10/29/2022. Arrythmias:Atrial Fibrillation, Atrial Flutter and LBBB; Risk Factors:Hypertension and Dyslipidemia.  Sonographer:    Cathie Beams RCS Referring Phys: [phone] PHILIP J NAHSER  IMPRESSIONS   1. Left ventricular ejection fraction, by estimation, is 20 to 25%. The left ventricle has severely decreased function. The left ventricle  demonstrates global hypokinesis. There is moderate left ventricular hypertrophy. Left ventricular diastolic parameters are indeterminate. 2. Right ventricular systolic function is severely reduced. The right ventricular size is normal. There is moderately elevated pulmonary artery systolic pressure. The estimated right ventricular systolic pressure is 50.3 mmHg. 3. Left atrial size was mildly dilated. 4. Right atrial size was mildly dilated. 5. A small pericardial effusion is present. 6. The mitral valve is abnormal. Moderate mitral valve regurgitation. Appears functional. No evidence of mitral stenosis. 7. Tricuspid valve regurgitation is moderate to severe. 8. The aortic valve is calcified. There is moderate calcification of the aortic valve. Aortic valve regurgitation is trivial. Aortic valve sclerosis/calcification is present, without any evidence of aortic stenosis. 9. The inferior vena cava is dilated in size with <50% respiratory variability, suggesting right atrial pressure of 15 mmHg.  FINDINGS Left Ventricle: Left ventricular ejection fraction, by estimation, is 20 to 25%. The left ventricle has severely decreased function. The left ventricle demonstrates global hypokinesis. The left ventricular internal cavity size was normal in size. There is moderate left ventricular hypertrophy. Left ventricular diastolic parameters are indeterminate.  Right Ventricle: The right ventricular size is normal. No increase in right ventricular wall thickness. Right ventricular systolic function is severely reduced. There is moderately elevated pulmonary  artery systolic pressure. The tricuspid regurgitant velocity is 2.97 m/s, and with an assumed right atrial pressure of 15 mmHg, the estimated right ventricular systolic pressure is 50.3 mmHg.  Left Atrium: Left atrial size was mildly dilated.  Right Atrium: Right atrial size was mildly dilated.  Pericardium: A small pericardial effusion is present.  Mitral Valve: The mitral valve is abnormal. Moderate mitral valve regurgitation. No evidence of mitral valve stenosis.  Tricuspid Valve: The tricuspid valve is normal in structure. Tricuspid valve regurgitation is moderate to severe.  Aortic Valve: The aortic valve is calcified. There is moderate calcification of the aortic valve. Aortic valve regurgitation is trivial. Aortic valve sclerosis/calcification is present, without any evidence of aortic stenosis.  Pulmonic Valve: The pulmonic valve was not well visualized. Pulmonic valve regurgitation is mild.  Aorta: The aortic root and ascending aorta are structurally normal, with no evidence of dilitation.  Venous: The inferior vena cava is dilated in size with less than 50% respiratory variability, suggesting right atrial pressure of 15 mmHg.  IAS/Shunts: The interatrial septum was not well visualized.   LEFT VENTRICLE PLAX 2D LVIDd:         4.30 cm   Diastology LVIDs:         3.60 cm   LV e' medial:    4.31 cm/s LV PW:         1.60 cm   LV E/e' medial:  23.1 LV IVS:        1.60 cm   LV e' lateral:   8.10 cm/s LVOT diam:     2.00 cm   LV E/e' lateral: 12.3 LV SV:         24 LV SV Index:   14 LVOT Area:     3.14 cm  3D Volume EF: 3D EF:        20 % LV EDV:       172 ml LV ESV:       138 ml LV SV:        34 ml  RIGHT VENTRICLE RV Basal diam:  3.20 cm RV S prime:     5.68 cm/s TAPSE (M-mode): 0.7 cm RVSP:  50.3 mmHg  LEFT ATRIUM           Index        RIGHT ATRIUM            Index LA diam:      4.90 cm 2.82 cm/m   RA Pressure: 15.00 mmHg LA Vol (A2C): 77.1 ml 44.36  ml/m  RA Area:     19.60 cm LA Vol (A4C): 45.4 ml 26.12 ml/m  RA Volume:   59.50 ml   34.23 ml/m AORTIC VALVE LVOT Vmax:   72.97 cm/s LVOT Vmean:  43.800 cm/s LVOT VTI:    0.078 m  AORTA Ao Root diam: 3.70 cm Ao Asc diam:  3.50 cm  MITRAL VALVE                  TRICUSPID VALVE MV Area (PHT): 6.67 cm       TR Peak grad:   35.3 mmHg MV Decel Time: 114 msec       TR Vmax:        297.00 cm/s MR Peak grad:    83.4 mmHg    Estimated RAP:  15.00 mmHg MR Mean grad:    45.7 mmHg    RVSP:           50.3 mmHg MR Vmax:         456.67 cm/s MR Vmean:        301.3 cm/s   SHUNTS MR PISA:         3.08 cm     Systemic VTI:  0.08 m MR PISA Eff ROA: 26 mm       Systemic Diam: 2.00 cm MR PISA Radius:  0.70 cm MV E velocity: 99.60 cm/s MV A velocity: 35.37 cm/s MV E/A ratio:  2.82  Epifanio Lesches MD Electronically signed by Epifanio Lesches MD Signature Date/Time: 02/02/2023/7:15:36 PM    Final   TEE  ECHO TEE 10/29/2022  Narrative TRANSESOPHOGEAL ECHO REPORT    Patient Name:   Douglas Matthews Date of Exam: 10/29/2022 Medical Rec #:  [id number]        Height:       68.0 in Accession #:    [id number]       Weight:       144.3 lb Date of Birth:  [birth year]       BSA:          1.779 m Patient Age:    87 years         BP:           82/62 mmHg Patient Gender: M                HR:           89 bpm. Exam Location:  Inpatient  Procedure: Cardiac Doppler, Color Doppler and Transesophageal Echo  Indications:     Atrial fibrillation  History:         Patient has prior history of Echocardiogram examinations, most recent 10/25/2022. CHF, Arrythmias:LBBB, Signs/Symptoms:Shortness of Breath; Risk Factors:Dyslipidemia and Hypertension.  Sonographer:     Ross Ludwig RDCS (AE) Referring Phys:  [id number] Deloris Ping NAHSER Diagnosing Phys: Olga Millers MD  PROCEDURE: After discussion of the risks and benefits of a TEE, an informed consent was obtained from the patient. The  transesophogeal probe was passed without difficulty through the esophogus of the patient. Sedation performed by different physician. The patient was monitored while under deep sedation. Anesthestetic sedation was provided intravenously  by Anesthesiology: 87.97mg  of Propofol, 60mg  of Lidocaine. Image quality was good. The patient developed no complications during the procedure. A successful direct current cardioversion was performed at 200 joules with 1 attempt.  IMPRESSIONS   1. Pt subsequently underwent DCCV with 200J to sinus rhythm. 2. Left ventricular ejection fraction, by estimation, is 30 to 35%. The left ventricle has moderately decreased function. The left ventricle demonstrates global hypokinesis. 3. Right ventricular systolic function is normal. The right ventricular size is mildly enlarged. 4. Left atrial size was moderately dilated. No left atrial/left atrial appendage thrombus was detected. 5. Right atrial size was moderately dilated. 6. A small pericardial effusion is present. 7. The mitral valve is normal in structure. Mild mitral valve regurgitation. 8. The aortic valve is tricuspid. Aortic valve regurgitation is trivial. 9. There is mild (Grade II) plaque involving the descending aorta.  FINDINGS Left Ventricle: Left ventricular ejection fraction, by estimation, is 30 to 35%. The left ventricle has moderately decreased function. The left ventricle demonstrates global hypokinesis. The left ventricular internal cavity size was normal in size.  Right Ventricle: The right ventricular size is mildly enlarged. Right ventricular systolic function is normal.  Left Atrium: Left atrial size was moderately dilated. No left atrial/left atrial appendage thrombus was detected.  Right Atrium: Right atrial size was moderately dilated.  Pericardium: A small pericardial effusion is present.  Mitral Valve: The mitral valve is normal in structure. Mild mitral valve  regurgitation.  Tricuspid Valve: The tricuspid valve is normal in structure. Tricuspid valve regurgitation is mild.  Aortic Valve: The aortic valve is tricuspid. Aortic valve regurgitation is trivial.  Pulmonic Valve: The pulmonic valve was normal in structure. Pulmonic valve regurgitation is trivial.  Aorta: The aortic root is normal in size and structure. There is mild (Grade II) plaque involving the descending aorta.  IAS/Shunts: No atrial level shunt detected by color flow Doppler.  Additional Comments: Pt subsequently underwent DCCV with 200J to sinus rhythm.  RIGHT VENTRICLE RV Basal diam:  5.00 cm   AORTA Ao Root diam: 3.70 cm Ao Asc diam:  3.50 cm  Olga Millers MD Electronically signed by Olga Millers MD Signature Date/Time: 10/29/2022/2:17:42 PM    Final            Risk Assessment/Calculations:    CHA2DS2-VASc Score = 4  {Confirm score is correct.  If not, click here to update score.  REFRESH note.  :1} This indicates a 4.8% annual risk of stroke. The patient's score is based upon: CHF History: 1 HTN History: 1 Diabetes History: 0 Stroke History: 0 Vascular Disease History: 0 Age Score: 2 Gender Score: 0   {This patient has a significant risk of stroke if diagnosed with atrial fibrillation.  Please consider VKA or DOAC agent for anticoagulation if the bleeding risk is acceptable.   You can also use the SmartPhrase .[email or web address] for documentation.   :[id number]}        Lab Results  Component Value Date   WBC 4.5 02/25/2023   HGB 12.9 (A) 02/25/2023   HCT 38 (A) 02/25/2023   MCV 88.1 02/10/2023   PLT 226 02/25/2023   Lab Results  Component Value Date   CREATININE 5.3 (A) 02/25/2023   BUN 71 (A) 02/25/2023   NA 138 02/25/2023   K 4.0 02/25/2023   CL 94 (A) 02/25/2023   CO2 29 (A) 02/25/2023   Lab Results  Component Value Date   ALT 64 (H) 02/05/2023   AST 40 02/05/2023   ALKPHOS  131 (H) 02/05/2023   BILITOT 0.9 02/05/2023   Lab  Results  Component Value Date   CHOL 259 (H) 05/08/2021   HDL 59.40 05/08/2021   LDLCALC 182 (H) 05/08/2021   LDLDIRECT 220.5 12/15/2011   TRIG 87.0 05/08/2021   CHOLHDL 4 05/08/2021    Lab Results  Component Value Date   HGBA1C 6.2 04/06/2023     Assessment & Plan    1.  Atrial fibrillation: -Mr. Farinha was rate controlled however still in atrial fibrillation by EKG and auscultation. -Per Dr. Elease Hashimoto patient will undergo DCCV -Patient reports itching with Eliquis and we will discontinue today and start Xarelto 15 mg daily -Patient's creatinine clearance is*** -Patient currently tolerating amiodarone 20 mg daily -We will complete surveillance labs with TSH, c-Met and CBC -CHA2DS2-VASc Score = 4 [CHF History: 1, HTN History: 1, Diabetes History: 0, Stroke History: 0, Vascular Disease History: 0, Age Score: 2, Gender Score: 0].  Therefore, the patient's annual risk of stroke is 4.8 %.      2.  HFrEF: -Patient is euvolemic on examination today -Continue torsemide 40 mg daily -Low sodium diet, fluid restriction <2L, and daily weights encouraged. Educated to contact our office for weight gain of 2 lbs overnight or 5 lbs in one week.   3.  Essential hypertension: -Patient's blood pressure today was controlled at 100/58   4.  CKD stage IV: -Patient's most recent creatinine was 5.5 -Patient was placed on renal dose of Xarelto 15 mg daily    Disposition: Follow-up with Kristeen Miss, MD or APP in 1 week Informed Consent   Shared Decision Making/Informed Consent{ All outpatient stress tests require an informed consent ([id number]) ATTESTATION ORDER       :[id number]} The risks (stroke, cardiac arrhythmias rarely resulting in the need for a temporary or permanent pacemaker, skin irritation or burns and complications associated with conscious sedation including aspiration, arrhythmia, respiratory failure and death), benefits (restoration of normal sinus rhythm) and alternatives of a  direct current cardioversion were explained in detail to Mr. Tokuda and he agrees to proceed.        Medication Adjustments/Labs and Tests Ordered: Current medicines are reviewed at length with the patient today.  Concerns regarding medicines are outlined above.   Signed, Napoleon Form, Leodis Rains, NP 05/19/2023, 3:17 PM [facility]

## 2023-05-18 NOTE — H&P (View-Only) (Signed)
Office Visit    Patient Name: [name] Date of Encounter: 05/19/2023  Primary Care Provider:  [place], [name], MD Primary Cardiologist:  Kristeen Miss, MD Primary Electrophysiologist: None   Past Medical History    Past Medical History:  Diagnosis Date   Anemia    Atrial fibrillation (HCC)    BPH (benign prostatic hypertrophy)    Chronic kidney disease    Diverticulosis    Hyperlipidemia    Hypertension    Past Surgical History:  Procedure Laterality Date   APPENDECTOMY     CARDIOVERSION N/A 10/29/2022   Procedure: CARDIOVERSION;  Surgeon: Lewayne Bunting, MD;  Location: [facility] ENDOSCOPY;  Service: Cardiovascular;  Laterality: N/A;   HERNIA REPAIR     TEE WITHOUT CARDIOVERSION N/A 10/29/2022   Procedure: TRANSESOPHAGEAL ECHOCARDIOGRAM (TEE);  Surgeon: Lewayne Bunting, MD;  Location: [facility] ENDOSCOPY;  Service: Cardiovascular;  Laterality: N/A;    Allergies  No Known Allergies   History of Present Illness    Douglas Matthews  is a 88 year old male with a PMH of AF (on Eliquis), HTN, HLD, HFrEF, BPH, LBBB, HFpEF, anemia who presents today for 4-week follow-up.  Mr. Luhrsen was seen initially in 10/2022 when he presented to the ER with complaint of shortness of breath x 2 days due to pneumonia.  EKG was completed showing AF/flutter with RVR and new LBBB.  Patient underwent successful TEE/DCCV and maintaining sinus rhythm postprocedure.  2D echo was completed showing EF of 30-35% with moderate biatrial enlargement with mild RVE and small pericardial effusion with mild MR and TR.  He was discharged with Eliquis for anticoagulation and was discharged with optimize GDMT due to new onset CHF.  He was seen in follow-up 01/20/2023 and was found to be back in atrial fibrillation.  He was readmitted to the hospital 01/27/2023 with acute on chronic renal insufficiency and CHF.    He was consulted by nephrology and patient was diuresed with IV Lasix.  He was last seen by Dr. Elease Hashimoto  04/21/2023 for posthospital follow-up.  He continued to improve with his breathing and creatinine was elevated at baseline of 4.3 with inability to titrate GDMT.  He was found to have rapid AF and was loaded with amiodarone and presents today for follow-up.  Mr. Kellison presents today for 4-week follow-up with his wife.  Since last being seen in the office patient reports that he has been feeling a little better but notes some tiredness with everyday activities such as washing his car.  His blood pressure today is controlled at 100/58 and heart rate was 69 bpm.  He has been compliant with his current medication regimen and does report some ongoing itching since starting Eliquis.  He denies any occult bleeding with his current blood thinner.  During today's visit we discussed the pathophysiology of atrial fibrillation and patient had all questions answered to his satisfaction.  He also notes a poor p.o. intake since being diagnosed with atrial fibrillation.  He is supplementing currently with Ensure shakes.  Patient denies chest pain, palpitations, dyspnea, PND, orthopnea, nausea, vomiting, dizziness, syncope, edema, weight gain, or early satiety.   Home Medications    Current Outpatient Medications  Medication Sig Dispense Refill   COD LIVER OIL PO Take 1 tablet by mouth daily.     ELIQUIS 2.5 MG TABS tablet TAKE 1 TABLET BY MOUTH TWICE A DAY 180 tablet 1   GARLIC PO Take 1 tablet by mouth daily.  isosorbide mononitrate (IMDUR) 30 MG 24 hr tablet TAKE 1 TABLET BY MOUTH EVERY DAY 90 tablet 3   Multiple Vitamin (MULTIVITAMIN) capsule Take 1 capsule by mouth daily.     Saw Palmetto 450 MG CAPS Take 900 mg by mouth daily.     Torsemide 40 MG TABS Take 20-40 mg by mouth daily. 30 tablet 2   No current facility-administered medications for this visit.     Review of Systems  Please see the history of present illness.    (+) Fatigue (+) Palpitations  All other systems reviewed and are otherwise  negative except as noted above.  Physical Exam    Wt Readings from Last 3 Encounters:  05/19/23 127 lb (57.6 kg)  04/21/23 127 lb 6.4 oz (57.8 kg)  04/06/23 131 lb 2 oz (59.5 kg)   VS: Vitals:   05/19/23 1423  BP: (!) 100/58  Pulse: 69  SpO2: 100%  ,Body mass index is 19.31 kg/m.  Constitutional:      Appearance: Healthy appearance. Not in distress.  Neck:     Vascular: JVD normal.  Pulmonary:     Effort: Pulmonary effort is normal.     Breath sounds: No wheezing. No rales. Diminished in the bases Cardiovascular:     Irregularly irregular. Normal S1. Normal S2.      Murmurs: There is no murmur.  Edema:    Peripheral edema absent.  Abdominal:     Palpations: Abdomen is soft non tender. There is no hepatomegaly.  Skin:    General: Skin is warm and dry.  Neurological:     General: No focal deficit present.     Mental Status: Alert and oriented to person, place and time.     Cranial Nerves: Cranial nerves are intact.  EKG/LABS/ Recent Cardiac Studies    ECG personally reviewed by me today -atrial fibrillation with left axis deviation and left bundle branch block with a rate of 89 and no acute changes consistent with previous EKG.  Cardiac Studies & Procedures       ECHOCARDIOGRAM  ECHOCARDIOGRAM COMPLETE 02/02/2023  Narrative ECHOCARDIOGRAM REPORT    Patient Name:   LEM Matthews Date of Exam: 02/02/2023 Medical Rec #:  [id number]        Height:       68.0 in Accession #:    [id number]       Weight:       136.6 lb Date of Birth:  1934/12/09       BSA:          1.738 m Patient Age:    87 years         BP:           126/68 mmHg Patient Gender: M                HR:           116 bpm. Exam Location:  [address]  Procedure: 2D Echo, Cardiac Doppler, Color Doppler and 3D Echo  Indications:    I50.42 Chronic combined systolic (congestive) and diastolic (congestive) heart failure  History:        Patient has prior history of Echocardiogram examinations,  most recent 10/29/2022. Arrythmias:Atrial Fibrillation, Atrial Flutter and LBBB; Risk Factors:Hypertension and Dyslipidemia.  Sonographer:    Cathie Beams RCS Referring Phys: [phone] PHILIP J NAHSER  IMPRESSIONS   1. Left ventricular ejection fraction, by estimation, is 20 to 25%. The left ventricle has severely decreased function. The left ventricle  demonstrates global hypokinesis. There is moderate left ventricular hypertrophy. Left ventricular diastolic parameters are indeterminate. 2. Right ventricular systolic function is severely reduced. The right ventricular size is normal. There is moderately elevated pulmonary artery systolic pressure. The estimated right ventricular systolic pressure is 50.3 mmHg. 3. Left atrial size was mildly dilated. 4. Right atrial size was mildly dilated. 5. A small pericardial effusion is present. 6. The mitral valve is abnormal. Moderate mitral valve regurgitation. Appears functional. No evidence of mitral stenosis. 7. Tricuspid valve regurgitation is moderate to severe. 8. The aortic valve is calcified. There is moderate calcification of the aortic valve. Aortic valve regurgitation is trivial. Aortic valve sclerosis/calcification is present, without any evidence of aortic stenosis. 9. The inferior vena cava is dilated in size with <50% respiratory variability, suggesting right atrial pressure of 15 mmHg.  FINDINGS Left Ventricle: Left ventricular ejection fraction, by estimation, is 20 to 25%. The left ventricle has severely decreased function. The left ventricle demonstrates global hypokinesis. The left ventricular internal cavity size was normal in size. There is moderate left ventricular hypertrophy. Left ventricular diastolic parameters are indeterminate.  Right Ventricle: The right ventricular size is normal. No increase in right ventricular wall thickness. Right ventricular systolic function is severely reduced. There is moderately elevated pulmonary  artery systolic pressure. The tricuspid regurgitant velocity is 2.97 m/s, and with an assumed right atrial pressure of 15 mmHg, the estimated right ventricular systolic pressure is 50.3 mmHg.  Left Atrium: Left atrial size was mildly dilated.  Right Atrium: Right atrial size was mildly dilated.  Pericardium: A small pericardial effusion is present.  Mitral Valve: The mitral valve is abnormal. Moderate mitral valve regurgitation. No evidence of mitral valve stenosis.  Tricuspid Valve: The tricuspid valve is normal in structure. Tricuspid valve regurgitation is moderate to severe.  Aortic Valve: The aortic valve is calcified. There is moderate calcification of the aortic valve. Aortic valve regurgitation is trivial. Aortic valve sclerosis/calcification is present, without any evidence of aortic stenosis.  Pulmonic Valve: The pulmonic valve was not well visualized. Pulmonic valve regurgitation is mild.  Aorta: The aortic root and ascending aorta are structurally normal, with no evidence of dilitation.  Venous: The inferior vena cava is dilated in size with less than 50% respiratory variability, suggesting right atrial pressure of 15 mmHg.  IAS/Shunts: The interatrial septum was not well visualized.   LEFT VENTRICLE PLAX 2D LVIDd:         4.30 cm   Diastology LVIDs:         3.60 cm   LV e' medial:    4.31 cm/s LV PW:         1.60 cm   LV E/e' medial:  23.1 LV IVS:        1.60 cm   LV e' lateral:   8.10 cm/s LVOT diam:     2.00 cm   LV E/e' lateral: 12.3 LV SV:         24 LV SV Index:   14 LVOT Area:     3.14 cm  3D Volume EF: 3D EF:        20 % LV EDV:       172 ml LV ESV:       138 ml LV SV:        34 ml  RIGHT VENTRICLE RV Basal diam:  3.20 cm RV S prime:     5.68 cm/s TAPSE (M-mode): 0.7 cm RVSP:  50.3 mmHg  LEFT ATRIUM           Index        RIGHT ATRIUM            Index LA diam:      4.90 cm 2.82 cm/m   RA Pressure: 15.00 mmHg LA Vol (A2C): 77.1 ml 44.36  ml/m  RA Area:     19.60 cm LA Vol (A4C): 45.4 ml 26.12 ml/m  RA Volume:   59.50 ml   34.23 ml/m AORTIC VALVE LVOT Vmax:   72.97 cm/s LVOT Vmean:  43.800 cm/s LVOT VTI:    0.078 m  AORTA Ao Root diam: 3.70 cm Ao Asc diam:  3.50 cm  MITRAL VALVE                  TRICUSPID VALVE MV Area (PHT): 6.67 cm       TR Peak grad:   35.3 mmHg MV Decel Time: 114 msec       TR Vmax:        297.00 cm/s MR Peak grad:    83.4 mmHg    Estimated RAP:  15.00 mmHg MR Mean grad:    45.7 mmHg    RVSP:           50.3 mmHg MR Vmax:         456.67 cm/s MR Vmean:        301.3 cm/s   SHUNTS MR PISA:         3.08 cm     Systemic VTI:  0.08 m MR PISA Eff ROA: 26 mm       Systemic Diam: 2.00 cm MR PISA Radius:  0.70 cm MV E velocity: 99.60 cm/s MV A velocity: 35.37 cm/s MV E/A ratio:  2.82  Epifanio Lesches MD Electronically signed by Epifanio Lesches MD Signature Date/Time: 02/02/2023/7:15:36 PM    Final   TEE  ECHO TEE 10/29/2022  Narrative TRANSESOPHOGEAL ECHO REPORT    Patient Name:   THORVALD CAROTENUTO Date of Exam: 10/29/2022 Medical Rec #:  [id number]        Height:       68.0 in Accession #:    [id number]       Weight:       144.3 lb Date of Birth:  04/07/1935       BSA:          1.779 m Patient Age:    87 years         BP:           82/62 mmHg Patient Gender: M                HR:           89 bpm. Exam Location:  Inpatient  Procedure: Cardiac Doppler, Color Doppler and Transesophageal Echo  Indications:     Atrial fibrillation  History:         Patient has prior history of Echocardiogram examinations, most recent 10/25/2022. CHF, Arrythmias:LBBB, Signs/Symptoms:Shortness of Breath; Risk Factors:Dyslipidemia and Hypertension.  Sonographer:     Ross Ludwig RDCS (AE) Referring Phys:  [id number] Deloris Ping NAHSER Diagnosing Phys: Olga Millers MD  PROCEDURE: After discussion of the risks and benefits of a TEE, an informed consent was obtained from the patient. The  transesophogeal probe was passed without difficulty through the esophogus of the patient. Sedation performed by different physician. The patient was monitored while under deep sedation. Anesthestetic sedation was provided intravenously  by Anesthesiology: 87.97mg  of Propofol, 60mg  of Lidocaine. Image quality was good. The patient developed no complications during the procedure. A successful direct current cardioversion was performed at 200 joules with 1 attempt.  IMPRESSIONS   1. Pt subsequently underwent DCCV with 200J to sinus rhythm. 2. Left ventricular ejection fraction, by estimation, is 30 to 35%. The left ventricle has moderately decreased function. The left ventricle demonstrates global hypokinesis. 3. Right ventricular systolic function is normal. The right ventricular size is mildly enlarged. 4. Left atrial size was moderately dilated. No left atrial/left atrial appendage thrombus was detected. 5. Right atrial size was moderately dilated. 6. A small pericardial effusion is present. 7. The mitral valve is normal in structure. Mild mitral valve regurgitation. 8. The aortic valve is tricuspid. Aortic valve regurgitation is trivial. 9. There is mild (Grade II) plaque involving the descending aorta.  FINDINGS Left Ventricle: Left ventricular ejection fraction, by estimation, is 30 to 35%. The left ventricle has moderately decreased function. The left ventricle demonstrates global hypokinesis. The left ventricular internal cavity size was normal in size.  Right Ventricle: The right ventricular size is mildly enlarged. Right ventricular systolic function is normal.  Left Atrium: Left atrial size was moderately dilated. No left atrial/left atrial appendage thrombus was detected.  Right Atrium: Right atrial size was moderately dilated.  Pericardium: A small pericardial effusion is present.  Mitral Valve: The mitral valve is normal in structure. Mild mitral valve  regurgitation.  Tricuspid Valve: The tricuspid valve is normal in structure. Tricuspid valve regurgitation is mild.  Aortic Valve: The aortic valve is tricuspid. Aortic valve regurgitation is trivial.  Pulmonic Valve: The pulmonic valve was normal in structure. Pulmonic valve regurgitation is trivial.  Aorta: The aortic root is normal in size and structure. There is mild (Grade II) plaque involving the descending aorta.  IAS/Shunts: No atrial level shunt detected by color flow Doppler.  Additional Comments: Pt subsequently underwent DCCV with 200J to sinus rhythm.  RIGHT VENTRICLE RV Basal diam:  5.00 cm   AORTA Ao Root diam: 3.70 cm Ao Asc diam:  3.50 cm  Olga Millers MD Electronically signed by Olga Millers MD Signature Date/Time: 10/29/2022/2:17:42 PM    Final            Risk Assessment/Calculations:    CHA2DS2-VASc Score = 4   This indicates a 4.8% annual risk of stroke. The patient's score is based upon: CHF History: 1 HTN History: 1 Diabetes History: 0 Stroke History: 0 Vascular Disease History: 0 Age Score: 2 Gender Score: 0           Lab Results  Component Value Date   WBC 4.5 02/25/2023   HGB 12.9 (A) 02/25/2023   HCT 38 (A) 02/25/2023   MCV 88.1 02/10/2023   PLT 226 02/25/2023   Lab Results  Component Value Date   CREATININE 5.3 (A) 02/25/2023   BUN 71 (A) 02/25/2023   NA 138 02/25/2023   K 4.0 02/25/2023   CL 94 (A) 02/25/2023   CO2 29 (A) 02/25/2023   Lab Results  Component Value Date   ALT 64 (H) 02/05/2023   AST 40 02/05/2023   ALKPHOS 131 (H) 02/05/2023   BILITOT 0.9 02/05/2023   Lab Results  Component Value Date   CHOL 259 (H) 05/08/2021   HDL 59.40 05/08/2021   LDLCALC 182 (H) 05/08/2021   LDLDIRECT 220.5 12/15/2011   TRIG 87.0 05/08/2021   CHOLHDL 4 05/08/2021    Lab Results  Component Value Date  HGBA1C 6.2 04/06/2023     Assessment & Plan    1.  Atrial fibrillation: -Mr. Lojek was rate controlled  however still in atrial fibrillation by EKG and auscultation. -Per Dr. Elease Hashimoto patient will undergo DCCV -Please continue Eliquis 5 mg twice daily and advised to try Zyrtec OTC to offset itching. -Patient currently tolerating amiodarone 20 mg daily -We will complete surveillance labs with TSH, c-Met and CBC -CHA2DS2-VASc Score = 4 [CHF History: 1, HTN History: 1, Diabetes History: 0, Stroke History: 0, Vascular Disease History: 0, Age Score: 2, Gender Score: 0].  Therefore, the patient's annual risk of stroke is 4.8 %.      2.  HFrEF: -Patient is euvolemic on examination today -Continue torsemide 40 mg daily -Low sodium diet, fluid restriction <2L, and daily weights encouraged. Educated to contact our office for weight gain of 2 lbs overnight or 5 lbs in one week.   3.  Essential hypertension: -Patient's blood pressure today was controlled at 100/58   4.  CKD stage IV: -Patient's most recent creatinine was 5.5  Disposition: Follow-up with Kristeen Miss, MD or APP in 1 week Informed Consent   Shared Decision Making/Informed Consent The risks (stroke, cardiac arrhythmias rarely resulting in the need for a temporary or permanent pacemaker, skin irritation or burns and complications associated with conscious sedation including aspiration, arrhythmia, respiratory failure and death), benefits (restoration of normal sinus rhythm) and alternatives of a direct current cardioversion were explained in detail to Mr. Provost and he agrees to proceed.        Medication Adjustments/Labs and Tests Ordered: Current medicines are reviewed at length with the patient today.  Concerns regarding medicines are outlined above.   Signed, Napoleon Form, Leodis Rains, NP 05/19/2023, 3:17 PM [facility]

## 2023-05-19 ENCOUNTER — Ambulatory Visit: Payer: Medicare Other | Attending: Nurse Practitioner | Admitting: Nurse Practitioner

## 2023-05-19 ENCOUNTER — Encounter: Payer: Self-pay | Admitting: Nurse Practitioner

## 2023-05-19 VITALS — BP 100/58 | HR 69 | Ht 68.0 in | Wt 127.0 lb

## 2023-05-19 DIAGNOSIS — N189 Chronic kidney disease, unspecified: Secondary | ICD-10-CM | POA: Diagnosis not present

## 2023-05-19 DIAGNOSIS — I482 Chronic atrial fibrillation, unspecified: Secondary | ICD-10-CM | POA: Diagnosis not present

## 2023-05-19 DIAGNOSIS — I5020 Unspecified systolic (congestive) heart failure: Secondary | ICD-10-CM | POA: Diagnosis not present

## 2023-05-19 DIAGNOSIS — I10 Essential (primary) hypertension: Secondary | ICD-10-CM | POA: Diagnosis not present

## 2023-05-19 MED ORDER — RIVAROXABAN 15 MG PO TABS: 15.0000 mg | ORAL_TABLET | Freq: Every day | ORAL | 1 refills | Status: DC

## 2023-05-19 MED ORDER — RIVAROXABAN 15 MG PO TABS: 15.0000 mg | ORAL_TABLET | Freq: Every day | ORAL | 0 refills | Status: DC

## 2023-05-19 NOTE — Patient Instructions (Addendum)
Medication Instructions:  STOP Eliquis START Xarelto 15mg  Take 1 tablet once a day; 28 day supply of samples were given  *If you need a refill on your cardiac medications before your next appointment, please call your pharmacy*   Lab Work: TODAY-CMET, CBC, TSH If you have labs (blood work) drawn today and your tests are completely normal, you will receive your results only by: MyChart Message (if you have MyChart) OR A paper copy in the mail If you have any lab test that is abnormal or we need to change your treatment, we will call you to review the results.   Testing/Procedures: Your physician has recommended that you have a Cardioversion (DCCV). Electrical Cardioversion uses a jolt of electricity to your heart either through paddles or wired patches attached to your chest. This is a controlled, usually prescheduled, procedure. Defibrillation is done under light anesthesia in the hospital, and you usually go home the day of the procedure. This is done to get your heart back into a normal rhythm. You are not awake for the procedure. Please see the instruction sheet given to you today.   Follow-Up: At [facility], you and your health needs are our priority.  As part of our continuing mission to provide you with exceptional heart care, we have created designated Provider Care Teams.  These Care Teams include your primary Cardiologist (physician) and Advanced Practice Providers (APPs -  Physician Assistants and Nurse Practitioners) who all work together to provide you with the care you need, when you need it.  We recommend signing up for the patient portal called "MyChart".  Sign up information is provided on this After Visit Summary.  MyChart is used to connect with patients for Virtual Visits (Telemedicine).  Patients are able to view lab/test results, encounter notes, upcoming appointments, etc.  Non-urgent messages can be sent to your provider as well.   To learn more about what you  can do with MyChart, go to [email or web address].    Your next appointment:   1 week(s)  Provider:   Robin Searing, NP        Other Instructions         Dear Douglas Matthews   You are scheduled for a Cardioversion on Thursday, June 20 with Dr. Rennis Golden.  Please arrive at the [facility] (Main Entrance A) at [facility]: [address] [place], [place] [address] at 12:30 PM (This time is 1.5 hour(s) before your procedure to ensure your preparation). Free valet parking service is available. You will check in at ADMITTING. The support person will be asked to wait in the waiting room.  It is OK to have someone drop you off and come back when you are ready to be discharged.      DIET:  Nothing to eat or drink after midnight except a sip of water with medications (see medication instructions below)  MEDICATION INSTRUCTIONS: !!IF ANY NEW MEDICATIONS ARE STARTED AFTER TODAY, PLEASE NOTIFY YOUR PROVIDER AS SOON AS POSSIBLE!!   Continue taking your anticoagulant (blood thinner): Rivaroxaban (Xarelto).  You will need to continue this after your procedure until you are told by your provider that it is safe to stop.    LABS:  Come to the lab at [facility] at [address] between the hours of 8:00 am and 4:30 pm. You do NOT have to be fasting.   You must have a responsible person to drive you home and stay in the waiting area  during your procedure. Failure to do so could result in cancellation.  Bring your insurance cards.  *Special Note: Every effort is made to have your procedure done on time. Occasionally there are emergencies that occur at the hospital that may cause delays. Please be patient if a delay does occur.

## 2023-05-20 ENCOUNTER — Telehealth: Payer: Self-pay | Admitting: Internal Medicine

## 2023-05-20 ENCOUNTER — Encounter: Payer: Self-pay | Admitting: Nurse Practitioner

## 2023-05-20 ENCOUNTER — Telehealth: Payer: Self-pay

## 2023-05-20 LAB — CBC
Hematocrit: 39.8 % (ref 37.5–51.0)
Hemoglobin: 12.7 g/dL — ABNORMAL LOW (ref 13.0–17.7)
MCH: 28.3 pg (ref 26.6–33.0)
MCHC: 31.9 g/dL (ref 31.5–35.7)
MCV: 89 fL (ref 79–97)
Platelets: 204 10*3/uL (ref 150–450)
RBC: 4.49 x10E6/uL (ref 4.14–5.80)
RDW: 17.1 % — ABNORMAL HIGH (ref 11.6–15.4)
WBC: 3.9 10*3/uL (ref 3.4–10.8)

## 2023-05-20 LAB — COMPREHENSIVE METABOLIC PANEL
ALT: 25 IU/L (ref 0–44)
AST: 28 IU/L (ref 0–40)
Albumin: 4.3 g/dL (ref 3.7–4.7)
Alkaline Phosphatase: 106 IU/L (ref 44–121)
BUN/Creatinine Ratio: 20 (ref 10–24)
BUN: 105 mg/dL (ref 8–27)
Bilirubin Total: 0.5 mg/dL (ref 0.0–1.2)
CO2: 20 mmol/L (ref 20–29)
Calcium: 9.5 mg/dL (ref 8.6–10.2)
Chloride: 102 mmol/L (ref 96–106)
Creatinine, Ser: 5.28 mg/dL — ABNORMAL HIGH (ref 0.76–1.27)
Globulin, Total: 3.3 g/dL (ref 1.5–4.5)
Glucose: 105 mg/dL — ABNORMAL HIGH (ref 70–99)
Potassium: 5.1 mmol/L (ref 3.5–5.2)
Sodium: 143 mmol/L (ref 134–144)
Total Protein: 7.6 g/dL (ref 6.0–8.5)
eGFR: 10 mL/min/{1.73_m2} — ABNORMAL LOW (ref >59)

## 2023-05-20 LAB — TSH: TSH: 15.8 u[IU]/mL — ABNORMAL HIGH (ref 0.450–4.500)

## 2023-05-20 MED ORDER — APIXABAN 2.5 MG PO TABS: 2.5000 mg | ORAL_TABLET | Freq: Two times a day (BID) | ORAL | 1 refills | Status: DC

## 2023-05-20 NOTE — Pre-Procedure Instructions (Signed)
Spoke to patient on the phone regarding cardioversion tomorrow:  Arrive at 0930, NPO after midnight  Confirmed no missed doses of eliquis, instructed patient to take it tonight and tomorrow in the AM with a sip of water  Confirmed patient will have ride home and responsible person to stay with him for 24 hours after the procedure

## 2023-05-20 NOTE — Telephone Encounter (Signed)
Wife wants call back to confirm patient's arrival time for procedure tomorrow (6/20).

## 2023-05-20 NOTE — Telephone Encounter (Signed)
The patient was seen in the office yesterday and was started on Xarelto. However due to the patients creatine level, per [name], he will need to stay on Eliquis. The patient has been made aware and was advised to take Zyrtec to help with the itchy he experienced with the Eliquis.   Patient agreeable and voiced understanding.   Contacted the pharmacy and informed them to put the Xarelto on hold, due to creatine levels. Pharmacy tech voiced understanding. She also stated the patient has two additional refills of Eliquis on file and another refill is not needed.   Medication list has been updated.  See lab result note for details about creatine levels.

## 2023-05-20 NOTE — Telephone Encounter (Signed)
Patient wife aware of time. Patient will arrive at 830 for 1030 appointment

## 2023-05-21 ENCOUNTER — Ambulatory Visit (HOSPITAL_BASED_OUTPATIENT_CLINIC_OR_DEPARTMENT_OTHER): Payer: Medicare Other | Admitting: Anesthesiology

## 2023-05-21 ENCOUNTER — Other Ambulatory Visit: Payer: Self-pay

## 2023-05-21 ENCOUNTER — Encounter (HOSPITAL_COMMUNITY)
Admission: RE | Disposition: A | Discharge status: Home / Self Care | Payer: Self-pay | Source: Home / Self Care | Attending: Internal Medicine

## 2023-05-21 ENCOUNTER — Encounter (HOSPITAL_COMMUNITY): Payer: Self-pay | Admitting: Internal Medicine

## 2023-05-21 ENCOUNTER — Ambulatory Visit (HOSPITAL_COMMUNITY): Payer: Medicare Other | Admitting: Anesthesiology

## 2023-05-21 ENCOUNTER — Other Ambulatory Visit: Payer: Self-pay | Admitting: Family Medicine

## 2023-05-21 ENCOUNTER — Ambulatory Visit (HOSPITAL_COMMUNITY)
Admission: RE | Admit: 2023-05-21 | Discharge: 2023-05-21 | Disposition: A | Discharge status: Home / Self Care | Payer: Medicare Other | Attending: Internal Medicine | Admitting: Internal Medicine

## 2023-05-21 DIAGNOSIS — Z7901 Long term (current) use of anticoagulants: Secondary | ICD-10-CM | POA: Insufficient documentation

## 2023-05-21 DIAGNOSIS — Z79899 Other long term (current) drug therapy: Secondary | ICD-10-CM | POA: Diagnosis not present

## 2023-05-21 DIAGNOSIS — N400 Enlarged prostate without lower urinary tract symptoms: Secondary | ICD-10-CM | POA: Diagnosis not present

## 2023-05-21 DIAGNOSIS — I130 Hypertensive heart and chronic kidney disease with heart failure and stage 1 through stage 4 chronic kidney disease, or unspecified chronic kidney disease: Secondary | ICD-10-CM | POA: Insufficient documentation

## 2023-05-21 DIAGNOSIS — N184 Chronic kidney disease, stage 4 (severe): Secondary | ICD-10-CM | POA: Insufficient documentation

## 2023-05-21 DIAGNOSIS — I447 Left bundle-branch block, unspecified: Secondary | ICD-10-CM | POA: Diagnosis not present

## 2023-05-21 DIAGNOSIS — I482 Chronic atrial fibrillation, unspecified: Secondary | ICD-10-CM

## 2023-05-21 DIAGNOSIS — I5022 Chronic systolic (congestive) heart failure: Secondary | ICD-10-CM | POA: Insufficient documentation

## 2023-05-21 DIAGNOSIS — I4891 Unspecified atrial fibrillation: Secondary | ICD-10-CM | POA: Diagnosis not present

## 2023-05-21 DIAGNOSIS — E785 Hyperlipidemia, unspecified: Secondary | ICD-10-CM | POA: Diagnosis not present

## 2023-05-21 DIAGNOSIS — I10 Essential (primary) hypertension: Secondary | ICD-10-CM

## 2023-05-21 HISTORY — PX: CARDIOVERSION: SHX1299

## 2023-05-21 SURGERY — CARDIOVERSION
Anesthesia: General

## 2023-05-21 MED ORDER — PROPOFOL 10 MG/ML IV BOLUS
INTRAVENOUS | Status: DC | PRN
  Administered 2023-05-21: 30 mg via INTRAVENOUS

## 2023-05-21 MED ORDER — SODIUM CHLORIDE 0.9 % IV SOLN: 250.0000 mL | INTRAVENOUS | Status: DC | PRN

## 2023-05-21 MED ORDER — ETOMIDATE 2 MG/ML IV SOLN
INTRAVENOUS | Status: DC | PRN
  Administered 2023-05-21: 6 mg via INTRAVENOUS

## 2023-05-21 MED ORDER — SODIUM CHLORIDE 0.9% FLUSH
3.0000 mL | Freq: Two times a day (BID) | INTRAVENOUS | Status: DC
  Administered 2023-05-21: 3 mL via INTRAVENOUS

## 2023-05-21 MED ORDER — SODIUM CHLORIDE 0.9% FLUSH: 3.0000 mL | INTRAVENOUS | Status: DC | PRN

## 2023-05-21 SURGICAL SUPPLY — 1 items: ELECT DEFIB PAD ADLT CADENCE (PAD) ×1 IMPLANT

## 2023-05-21 NOTE — Interval H&P Note (Signed)
History and Physical Interval Note:  05/21/2023 10:11 AM  Douglas Matthews  has presented today for surgery, with the diagnosis of AFIB.  The various methods of treatment have been discussed with the patient and family. After consideration of risks, benefits and other options for treatment, the patient has consented to  Procedure(s): CARDIOVERSION (N/A) as a surgical intervention.  The patient's history has been reviewed, patient examined, no change in status, stable for surgery.  I have reviewed the patient's chart and labs.  Questions were answered to the patient's satisfaction.     [name]

## 2023-05-21 NOTE — Transfer of Care (Signed)
Immediate Anesthesia Transfer of Care Note  Patient: Douglas Matthews  Procedure(s) Performed: CARDIOVERSION  Patient Location: PACU and Cath Lab  Anesthesia Type:General  Level of Consciousness: drowsy  Airway & Oxygen Therapy: Patient Spontanous Breathing and Patient connected to nasal cannula oxygen  Post-op Assessment: Report given to RN and Post -op Vital signs reviewed and stable  Post vital signs: Reviewed and stable  Last Vitals:  Vitals Value Taken Time  BP    Temp    Pulse    Resp    SpO2      Last Pain:  Vitals:   05/21/23 0859  TempSrc:   PainSc: 0-No pain         Complications: No notable events documented.

## 2023-05-21 NOTE — CV Procedure (Signed)
    CARDIOVERSION NOTE  Procedure: Electrical Cardioversion Indications:  Atrial Fibrillation  Procedure Details:  Consent: Risks of procedure as well as the alternatives and risks of each were explained to the (patient/caregiver).  Consent for procedure obtained.  Time Out: Verified patient identification, verified procedure, site/side was marked, verified correct patient position, special equipment/implants available, medications/allergies/relevent history reviewed, required imaging and test results available.  Performed  Patient placed on cardiac monitor, pulse oximetry, supplemental oxygen as necessary.  Sedation given:  propofol and etomidate per anesthesia Pacer pads placed anterior and posterior chest.  Cardioverted 2 time(s).  Cardioverted at 150J and 200J biphasic.  Impression: Findings: Post procedure EKG shows: NSR Complications: None Patient did tolerate procedure well.  Plan: Successful DCCV to NSR after 2 stacked shocks.  Time Spent Directly with the Patient:  30 minutes   [name], MD, [facility], FACP  [place]  [facility]  Medical Director of the [facility] Lipid Disorders &  Cardiovascular Risk Reduction Clinic Diplomate of the American Board of Clinical Lipidology Attending Cardiologist  Direct Dial: [phone]  Fax: [phone]  Website:  [email or web address] Nicely Jazma Pickel 05/21/2023, 10:25 AM

## 2023-05-21 NOTE — Discharge Instructions (Signed)

## 2023-05-21 NOTE — Anesthesia Preprocedure Evaluation (Signed)
Anesthesia Evaluation  Patient identified by MRN, date of birth, ID band Patient awake    Reviewed: Allergy & Precautions, H&P , NPO status , Patient's Chart, lab work & pertinent test results  Airway Mallampati: II   Neck ROM: full    Dental   Pulmonary neg pulmonary ROS   breath sounds clear to auscultation       Cardiovascular hypertension, + dysrhythmias Atrial Fibrillation  Rhythm:irregular Rate:Normal     Neuro/Psych    GI/Hepatic   Endo/Other    Renal/GU Renal InsufficiencyRenal disease     Musculoskeletal   Abdominal   Peds  Hematology   Anesthesia Other Findings   Reproductive/Obstetrics                             Anesthesia Physical Anesthesia Plan  ASA: 3  Anesthesia Plan: General   Post-op Pain Management:    Induction: Intravenous  PONV Risk Score and Plan: 2 and Propofol infusion and Treatment may vary due to age or medical condition  Airway Management Planned: Nasal Cannula  Additional Equipment:   Intra-op Plan:   Post-operative Plan:   Informed Consent: I have reviewed the patients History and Physical, chart, labs and discussed the procedure including the risks, benefits and alternatives for the proposed anesthesia with the patient or authorized representative who has indicated his/her understanding and acceptance.     Dental advisory given  Plan Discussed with: CRNA, Anesthesiologist and Surgeon  Anesthesia Plan Comments:        Anesthesia Quick Evaluation

## 2023-05-22 ENCOUNTER — Encounter (HOSPITAL_COMMUNITY): Payer: Self-pay | Admitting: Internal Medicine

## 2023-05-22 NOTE — Anesthesia Postprocedure Evaluation (Signed)
Anesthesia Post Note  Patient: Douglas Matthews  Procedure(s) Performed: CARDIOVERSION     Patient location during evaluation: Cath Lab Anesthesia Type: General Level of consciousness: awake and alert Pain management: pain level controlled Vital Signs Assessment: post-procedure vital signs reviewed and stable Respiratory status: spontaneous breathing, nonlabored ventilation, respiratory function stable and patient connected to nasal cannula oxygen Cardiovascular status: blood pressure returned to baseline and stable Postop Assessment: no apparent nausea or vomiting Anesthetic complications: no   No notable events documented.  Last Vitals:  Vitals:   05/21/23 1040 05/21/23 1050  BP: 107/68 111/66  Pulse: 77 77  Resp: 10 (!) 23  Temp:    SpO2: 100% 100%    Last Pain:  Vitals:   05/21/23 1030  TempSrc: Temporal  PainSc: 0-No pain                 Julie Nay S

## 2023-05-24 ENCOUNTER — Other Ambulatory Visit: Payer: Self-pay | Admitting: Family Medicine

## 2023-05-24 DIAGNOSIS — N189 Chronic kidney disease, unspecified: Secondary | ICD-10-CM

## 2023-05-24 DIAGNOSIS — I5020 Unspecified systolic (congestive) heart failure: Secondary | ICD-10-CM

## 2023-05-25 ENCOUNTER — Ambulatory Visit: Payer: Self-pay

## 2023-05-25 NOTE — Telephone Encounter (Signed)
Can you please contact pt and ask about current dose. Torsemide has been modified by his nephrologist. Thanks, BJ

## 2023-05-25 NOTE — Patient Instructions (Signed)
Visit Information  Thank you for taking time to visit with me today. Please don't hesitate to contact me if I can be of assistance to you.   Following are the goals we discussed today:   Goals Addressed             This Visit's Progress    Kidney Disease Management       Patient Goals/Self Care Activities: -Patient/Caregiver will self-administer medications as prescribed as evidenced by self-report/primary caregiver report  -Patient/Caregiver will attend all scheduled provider appointments as evidenced by clinician review of documented attendance to scheduled appointments and patient/caregiver report -Patient/Caregiver will call provider office for new concerns or questions as evidenced by review of documented incoming telephone call notes and patient report  -Weigh daily and record (notify MD with 3 lb weight gain over night or 5 lb in a week) -Adhere to low sodium diet  Weight today 127 lbs. Appetite better Cardioversion recently.  However, patient back in A. Fib.  Floow up with cardiology on 06/10/23.   Discussed managing fluid restriction and limiting salt intake.          Our next appointment is by telephone on 07/13/23 at 1130 am   Please call the care guide team at [phone] if you need to cancel or reschedule your appointment.   If you are experiencing a Mental Health or Behavioral Health Crisis or need someone to talk to, please call the Suicide and Crisis Lifeline: 988   Patient verbalizes understanding of instructions and care plan provided today and agrees to view in MyChart. Active MyChart status and patient understanding of how to access instructions and care plan via MyChart confirmed with patient.     The patient has been provided with contact information for the care management team and has been advised to call with any health related questions or concerns.   Bary Leriche, RN, MSN [facility] Care Management Care Management Coordinator Direct Line  [phone]

## 2023-05-25 NOTE — Telephone Encounter (Signed)
I spoke with patient, he is still alternating between 40 mg and 20 mg. Rx refilled.

## 2023-05-25 NOTE — Patient Outreach (Signed)
  Care Coordination   Follow Up Visit Note   05/25/2023 Name: MIGUEL CHRISTIANA MRN: [id number] DOB: 10/29/1935  DESHUN SEDIVY is a 88 y.o. year old male who sees [place], Timoteo Expose, MD for primary care. I spoke with  [name] by phone today.  What matters to the patients health and wellness today?  Managing health    Goals Addressed             This Visit's Progress    Kidney Disease Management       Patient Goals/Self Care Activities: -Patient/Caregiver will self-administer medications as prescribed as evidenced by self-report/primary caregiver report  -Patient/Caregiver will attend all scheduled provider appointments as evidenced by clinician review of documented attendance to scheduled appointments and patient/caregiver report -Patient/Caregiver will call provider office for new concerns or questions as evidenced by review of documented incoming telephone call notes and patient report  -Weigh daily and record (notify MD with 3 lb weight gain over night or 5 lb in a week) -Adhere to low sodium diet  Weight today 127 lbs. Appetite better Cardioversion recently.  However, patient back in A. Fib.  Floow up with cardiology on 06/10/23.   Discussed managing fluid restriction and limiting salt intake.          SDOH assessments and interventions completed:  Yes     Care Coordination Interventions:  Yes, provided   Follow up plan: Follow up call scheduled for August    Encounter Outcome:  Pt. Visit Completed   Bary Leriche, RN, MSN [facility] Care Management Care Management Coordinator Direct Line [phone]

## 2023-05-28 ENCOUNTER — Other Ambulatory Visit: Payer: Self-pay | Admitting: Family Medicine

## 2023-05-28 DIAGNOSIS — E039 Hypothyroidism, unspecified: Secondary | ICD-10-CM | POA: Insufficient documentation

## 2023-05-28 MED ORDER — LEVOTHYROXINE SODIUM 50 MCG PO TABS
50.0000 ug | ORAL_TABLET | Freq: Every day | ORAL | 1 refills | Status: DC
Start: 2023-05-28 — End: 2023-08-07

## 2023-05-29 NOTE — Progress Notes (Signed)
I spoke with patient and spouse. They are aware Rx was sent in & when to take it. Will be here for f/u visit 7/16.

## 2023-06-03 ENCOUNTER — Ambulatory Visit: Payer: Medicare Other | Admitting: Family Medicine

## 2023-06-09 NOTE — Progress Notes (Unsigned)
Office Visit    Patient Name: Douglas Matthews Date of Encounter: 06/09/2023  Primary Care Provider:  [place], [name], MD Primary Cardiologist:  Kristeen Miss, MD Primary Electrophysiologist: None   Past Medical History    Past Medical History:  Diagnosis Date   Anemia    Atrial fibrillation (HCC)    BPH (benign prostatic hypertrophy)    Chronic kidney disease    Diverticulosis    Hyperlipidemia    Hypertension    Past Surgical History:  Procedure Laterality Date   APPENDECTOMY     CARDIOVERSION N/A 10/29/2022   Procedure: CARDIOVERSION;  Surgeon: Lewayne Bunting, MD;  Location: [facility] ENDOSCOPY;  Service: Cardiovascular;  Laterality: N/A;   CARDIOVERSION N/A 05/21/2023   Procedure: CARDIOVERSION;  Surgeon: [name], MD;  Location: [facility] INVASIVE CV LAB;  Service: Cardiovascular;  Laterality: N/A;   HERNIA REPAIR     TEE WITHOUT CARDIOVERSION N/A 10/29/2022   Procedure: TRANSESOPHAGEAL ECHOCARDIOGRAM (TEE);  Surgeon: Lewayne Bunting, MD;  Location: [facility] ENDOSCOPY;  Service: Cardiovascular;  Laterality: N/A;    Allergies  No Known Allergies   History of Present Illness    Douglas Matthews  is a 88 year old male with a PMH of AF (on Eliquis), HTN, HLD, HFrEF, BPH, LBBB, HFpEF, anemia who presents today for 4-week follow-up.   Douglas Matthews was seen initially in 10/2022 when he presented to the ER with complaint of shortness of breath x 2 days due to pneumonia.  EKG was completed showing AF/flutter with RVR and new LBBB.  Patient underwent successful TEE/DCCV and maintaining sinus rhythm postprocedure.  2D echo was completed showing EF of 30-35% with moderate biatrial enlargement with mild RVE and small pericardial effusion with mild MR and TR.  He was discharged with Eliquis for anticoagulation and was discharged with optimize GDMT due to new onset CHF.  He was seen in follow-up 01/20/2023 and was found to be back in atrial fibrillation.  He was readmitted to the hospital  01/27/2023 with acute on chronic renal insufficiency and CHF.    He was consulted by nephrology and patient was diuresed with IV Lasix.  He was last seen by Dr. Elease Hashimoto 04/21/2023 for posthospital follow-up.  He continued to improve with his breathing and creatinine was elevated at baseline of 4.3 with inability to titrate GDMT.  He was found to have rapid AF and was loaded with amiodarone.  He was seen in follow-up on 05/19/2023 and unfortunately was still in atrial fibrillation.  He was scheduled for DCCV and underwent procedure on 05/21/2023 with conversion to sinus rhythm.  Douglas Matthews presents today for 2-week follow-up with his wife.  Since last being seen in the office patient reports that he has been feeling well but still has decreased appetite.  EKG was completed today showing sinus tach with possible atrial arrhythmia but no evidence of AF.  Patient's blood pressure is controlled today at 110/70 and heart rate is 110 bpm.  He reports doing well since his scheduled DCCV but does note ongoing fatigue at times.  He is still experiencing itching on his chest and back and patient was advised to try Benadryl cream.  Patient denies chest pain, palpitations, dyspnea, PND, orthopnea, nausea, vomiting, dizziness, syncope, edema, weight gain, or early satiety.  Home Medications    Current Outpatient Medications  Medication Sig Dispense Refill   amiodarone (PACERONE) 200 MG tablet Take 200 mg by mouth daily.     apixaban (ELIQUIS) 2.5  MG TABS tablet Take 1 tablet (2.5 mg total) by mouth 2 (two) times daily. 60 tablet 1   COD LIVER OIL PO Take 1 tablet by mouth daily.     Garlic 1000 MG CAPS Take 1,000 mg by mouth daily.     isosorbide mononitrate (IMDUR) 30 MG 24 hr tablet TAKE 1 TABLET BY MOUTH EVERY DAY (Patient not taking: Reported on 05/20/2023) 90 tablet 3   levothyroxine (SYNTHROID) 50 MCG tablet Take 1 tablet (50 mcg total) by mouth daily. 90 tablet 1   Multiple Vitamin (MULTIVITAMIN) capsule Take 1  capsule by mouth daily.     Saw Palmetto, Serenoa repens, (SAW PALMETTO PO) Take 800 mg by mouth daily. 400 mg     torsemide (DEMADEX) 20 MG tablet TAKE 1 -2 TABS (20-40) MG BY MOUTH DAILY. 30 tablet 5   No current facility-administered medications for this visit.     Review of Systems  Please see the history of present illness.    (+) Back and chest rash (+) Fatigue  All other systems reviewed and are otherwise negative except as noted above.  Physical Exam    Wt Readings from Last 3 Encounters:  05/21/23 127 lb (57.6 kg)  05/19/23 127 lb (57.6 kg)  04/21/23 127 lb 6.4 oz (57.8 kg)   ZO:XWRUE were no vitals filed for this visit.,There is no height or weight on file to calculate BMI.  Constitutional:      Appearance: Healthy appearance. Not in distress.  Neck:     Vascular: JVD normal.  Pulmonary:     Effort: Pulmonary effort is normal.     Breath sounds: No wheezing. No rales. Diminished in the bases Cardiovascular:     Sinus tach regular rhythm Normal S1. Normal S2.      Murmurs: There is no murmur.  Edema:    Peripheral edema absent.  Abdominal:     Palpations: Abdomen is soft non tender. There is no hepatomegaly.  Skin:    General: Skin is warm and dry.  Neurological:     General: No focal deficit present.     Mental Status: Alert and oriented to person, place and time.     Cranial Nerves: Cranial nerves are intact.  EKG/LABS/ Recent Cardiac Studies    ECG personally reviewed by me today -sinus tach with LBBB and rate of 108 bpm with possible PACs with no acute changes consistent with previous EKG.   Risk Assessment/Calculations:    CHA2DS2-VASc Score = 4   This indicates a 4.8% annual risk of stroke. The patient's score is based upon: CHF History: 1 HTN History: 1 Diabetes History: 0 Stroke History: 0 Vascular Disease History: 0 Age Score: 2 Gender Score: 0           Lab Results  Component Value Date   WBC 3.9 05/19/2023   HGB 12.7 (L)  05/19/2023   HCT 39.8 05/19/2023   MCV 89 05/19/2023   PLT 204 05/19/2023   Lab Results  Component Value Date   CREATININE 5.28 (H) 05/19/2023   BUN 105 (HH) 05/19/2023   NA 143 05/19/2023   K 5.1 05/19/2023   CL 102 05/19/2023   CO2 20 05/19/2023   Lab Results  Component Value Date   ALT 25 05/19/2023   AST 28 05/19/2023   ALKPHOS 106 05/19/2023   BILITOT 0.5 05/19/2023   Lab Results  Component Value Date   CHOL 259 (H) 05/08/2021   HDL 59.40 05/08/2021   LDLCALC 182 (  H) 05/08/2021   LDLDIRECT 220.5 12/15/2011   TRIG 87.0 05/08/2021   CHOLHDL 4 05/08/2021    Lab Results  Component Value Date   HGBA1C 6.2 04/06/2023     Assessment & Plan    1.  Atrial fibrillation:  -Patient underwent DCCV with conversion to sinus rhythm and today is sinus tachycardia with possible flutter waves noted. -Patient's case was discussed with DOD who recommended increasing amiodarone to 200 mg twice daily x 2 weeks and then 200 mg weekly. -Continue Eliquis 2.5 mg twice daily   2.  HFrEF: -Patient is euvolemic on examination today -Continue torsemide 40 mg daily -Low sodium diet, fluid restriction <2L, and daily weights encouraged. Educated to contact our office for weight gain of 2 lbs overnight or 5 lbs in one week.    3.  Essential hypertension: -Patient's blood pressure today was controlled at 100/58   4.  CKD stage IV: -Patient's most recent creatinine was 5.5  Disposition: Follow-up with Kristeen Miss, MD or APP in 2 weeks    Medication Adjustments/Labs and Tests Ordered: Current medicines are reviewed at length with the patient today.  Concerns regarding medicines are outlined above.   Signed, Napoleon Form, Leodis Rains, NP 06/09/2023, 1:04 PM [facility]

## 2023-06-10 ENCOUNTER — Ambulatory Visit: Payer: Medicare Other | Attending: Nurse Practitioner | Admitting: Nurse Practitioner

## 2023-06-10 ENCOUNTER — Encounter: Payer: Self-pay | Admitting: Nurse Practitioner

## 2023-06-10 VITALS — BP 110/70 | HR 110 | Ht 68.0 in | Wt 125.4 lb

## 2023-06-10 DIAGNOSIS — E785 Hyperlipidemia, unspecified: Secondary | ICD-10-CM | POA: Diagnosis not present

## 2023-06-10 DIAGNOSIS — I10 Essential (primary) hypertension: Secondary | ICD-10-CM | POA: Diagnosis not present

## 2023-06-10 DIAGNOSIS — I4819 Other persistent atrial fibrillation: Secondary | ICD-10-CM | POA: Diagnosis not present

## 2023-06-10 DIAGNOSIS — N189 Chronic kidney disease, unspecified: Secondary | ICD-10-CM | POA: Diagnosis not present

## 2023-06-10 DIAGNOSIS — I5020 Unspecified systolic (congestive) heart failure: Secondary | ICD-10-CM | POA: Insufficient documentation

## 2023-06-10 NOTE — Patient Instructions (Signed)
Medication Instructions:  INCREASE Amiodarone to 200mg  take 1 tablet twice a day for 2 weeks then decrease to once a day  *If you need a refill on your cardiac medications before your next appointment, please call your pharmacy*   Lab Work: None ordered   Testing/Procedures: None ordered   Follow-Up: At [facility], you and your health needs are our priority.  As part of our continuing mission to provide you with exceptional heart care, we have created designated Provider Care Teams.  These Care Teams include your primary Cardiologist (physician) and Advanced Practice Providers (APPs -  Physician Assistants and Nurse Practitioners) who all work together to provide you with the care you need, when you need it.  We recommend signing up for the patient portal called "MyChart".  Sign up information is provided on this After Visit Summary.  MyChart is used to connect with patients for Virtual Visits (Telemedicine).  Patients are able to view lab/test results, encounter notes, upcoming appointments, etc.  Non-urgent messages can be sent to your provider as well.   To learn more about what you can do with MyChart, go to [email or web address].    Your next appointment:   2 week(s)  Provider:   Robin Searing, NP        Other Instructions

## 2023-06-15 NOTE — Progress Notes (Unsigned)
HPI: [name] is a 88 y.o. male, who is here today for chronic disease management.  Last seen on 04/06/2023  *** Review of Systems See other pertinent positives and negatives in HPI.  Current Outpatient Medications on File Prior to Visit  Medication Sig Dispense Refill   amiodarone (PACERONE) 200 MG tablet Take 200 mg by mouth daily.     apixaban (ELIQUIS) 2.5 MG TABS tablet Take 1 tablet (2.5 mg total) by mouth 2 (two) times daily. 60 tablet 1   COD LIVER OIL PO Take 1 tablet by mouth daily.     Garlic 1000 MG CAPS Take 1,000 mg by mouth daily.     levothyroxine (SYNTHROID) 50 MCG tablet Take 1 tablet (50 mcg total) by mouth daily. 90 tablet 1   Multiple Vitamin (MULTIVITAMIN) capsule Take 1 capsule by mouth daily.     Saw Palmetto, Serenoa repens, (SAW PALMETTO PO) Take 800 mg by mouth daily. 400 mg     torsemide (DEMADEX) 20 MG tablet TAKE 1 -2 TABS (20-40) MG BY MOUTH DAILY. 30 tablet 5   No current facility-administered medications on file prior to visit.    Past Medical History:  Diagnosis Date   Anemia    Atrial fibrillation (HCC)    BPH (benign prostatic hypertrophy)    Chronic kidney disease    Diverticulosis    Hyperlipidemia    Hypertension    No Known Allergies  Social History   Socioeconomic History   Marital status: Married    Spouse name: Not on file   Number of children: Not on file   Years of education: Not on file   Highest education level: Not on file  Occupational History   Occupation: Retired  Tobacco Use   Smoking status: Never   Smokeless tobacco: Never  Vaping Use   Vaping status: Never Used  Substance and Sexual Activity   Alcohol use: No   Drug use: No   Sexual activity: Not on file  Other Topics Concern   Not on file  Social History Narrative   Married x 50 years    Social Determinants of Health   Financial Resource Strain: Low Risk  (07/08/2022)   Overall Financial Resource Strain (CARDIA)    Difficulty of  Paying Living Expenses: Not hard at all  Food Insecurity: No Food Insecurity (02/09/2023)   Hunger Vital Sign    Worried About Running Out of Food in the Last Year: Never true    Ran Out of Food in the Last Year: Never true  Transportation Needs: No Transportation Needs (02/16/2023)   PRAPARE - Administrator, Civil Service (Medical): No    Lack of Transportation (Non-Medical): No  Physical Activity: Insufficiently Active (07/08/2022)   Exercise Vital Sign    Days of Exercise per Week: 2 days    Minutes of Exercise per Session: 30 min  Stress: No Stress Concern Present (07/08/2022)   [employer] of Occupational Health - Occupational Stress Questionnaire    Feeling of Stress : Not at all  Social Connections: Moderately Integrated (07/02/2021)   Social Connection and Isolation Panel [NHANES]    Frequency of Communication with Friends and Family: Three times a week    Frequency of Social Gatherings with Friends and Family: Three times a week    Attends Religious Services: More than 4 times per year    Active Member of Clubs or Organizations: No    Attends Banker Meetings: Never  Marital Status: Married    There were no vitals filed for this visit. There is no height or weight on file to calculate BMI.  Physical Exam  ASSESSMENT AND PLAN:  There are no diagnoses linked to this encounter.  No orders of the defined types were placed in this encounter.   No problem-specific Assessment & Plan notes found for this encounter.   No follow-ups on file.  [name]. [place], MD  [facility]. [place] office.

## 2023-06-16 ENCOUNTER — Encounter: Payer: Self-pay | Admitting: Physician Assistant

## 2023-06-16 ENCOUNTER — Encounter: Payer: Self-pay | Admitting: Family Medicine

## 2023-06-16 ENCOUNTER — Ambulatory Visit (INDEPENDENT_AMBULATORY_CARE_PROVIDER_SITE_OTHER): Payer: Medicare Other | Admitting: Family Medicine

## 2023-06-16 VITALS — BP 102/70 | HR 100 | Temp 97.8°F | Resp 16 | Ht 68.0 in | Wt 129.0 lb

## 2023-06-16 DIAGNOSIS — I482 Chronic atrial fibrillation, unspecified: Secondary | ICD-10-CM

## 2023-06-16 DIAGNOSIS — R7303 Prediabetes: Secondary | ICD-10-CM

## 2023-06-16 DIAGNOSIS — E039 Hypothyroidism, unspecified: Secondary | ICD-10-CM | POA: Diagnosis not present

## 2023-06-16 DIAGNOSIS — I5020 Unspecified systolic (congestive) heart failure: Secondary | ICD-10-CM | POA: Diagnosis not present

## 2023-06-16 DIAGNOSIS — I4891 Unspecified atrial fibrillation: Secondary | ICD-10-CM

## 2023-06-16 DIAGNOSIS — I10 Essential (primary) hypertension: Secondary | ICD-10-CM

## 2023-06-16 DIAGNOSIS — Z0000 Encounter for general adult medical examination without abnormal findings: Secondary | ICD-10-CM | POA: Diagnosis not present

## 2023-06-16 DIAGNOSIS — E785 Hyperlipidemia, unspecified: Secondary | ICD-10-CM | POA: Diagnosis not present

## 2023-06-16 DIAGNOSIS — N185 Chronic kidney disease, stage 5: Secondary | ICD-10-CM

## 2023-06-16 DIAGNOSIS — N189 Chronic kidney disease, unspecified: Secondary | ICD-10-CM

## 2023-06-16 MED ORDER — TORSEMIDE 20 MG PO TABS: 20.0000 mg | ORAL_TABLET | Freq: Every day | ORAL | Status: DC | PRN

## 2023-06-16 NOTE — Progress Notes (Signed)
HPI: [name] is a 88 y.o.male with PMHx significant for CKD V,HTN, HLD, prediabetes,HFrEF, LBBB,atrial fib on chronic anticoagulation, and pulmonary HTN,here today with his wife for his routine physical examination.  Last CPE: 06/10/22.  He has not resumed his normal exercise routine but remains active around the house.  His diet includes daily vegetables, chicken,and fisk. He admits to occasionally indulging in ice cream and cheeseburgers. Denies alcohol intake and has no hx of tobacco use. He sleeps about 8-10 hours. Nocturia x 1.  Immunization History  Administered Date(s) Administered   COVID-19, mRNA, vaccine(Comirnaty)12 years and older 09/08/2022   Fluad Quad(high Dose 65+) 09/08/2022   Influenza Split 12/15/2011   Influenza Whole 09/01/2003, 09/19/2010   Influenza, High Dose Seasonal PF 09/22/2017   Influenza-Unspecified 08/31/2014   PFIZER(Purple Top)SARS-COV-2 Vaccination 12/23/2019, 01/22/2020, 09/03/2020, 04/12/2021   Pneumococcal Conjugate-13 01/09/2014   Pneumococcal Polysaccharide-23 12/01/2001, 06/13/2008   Td 12/01/1996, 06/13/2008   Tdap 07/16/2018   Zoster Recombinant(Shingrix) 04/13/2018, 09/01/2018   Health Maintenance  Topic Date Due   COVID-19 Vaccine (6 - 2023-24 season) 01/09/2023   Medicare Annual Wellness (AWV)  07/09/2023   INFLUENZA VACCINE  07/02/2023   DTaP/Tdap/Td (4 - Td or Tdap) 07/16/2028   Pneumonia Vaccine 32+ Years old  Completed   Zoster Vaccines- Shingrix  Completed   HPV VACCINES  Aged Out   He has upcoming appointments with his nephrologist next week and cardiologist tomorrow.  He states that at his recent cardiology appointment, his Amiodarone dosage was increased and now he is taking torsemide 20 mg daily as needed. He is also on Eliquis 2.5 mg bid. He denies PND,orthopnea,or PND.  He expresses concerns about itching, which he suspects may be related to his medication eliquis. He is trying different household  products to determine if they might be the cause of the itching, rather than immediately changing his medication.  HLD: He is on non pharmacologic treatment. Lab Results  Component Value Date   CHOL 259 (H) 05/08/2021   HDL 59.40 05/08/2021   LDLCALC 182 (H) 05/08/2021   LDLDIRECT 220.5 12/15/2011   TRIG 87.0 05/08/2021   CHOLHDL 4 05/08/2021   Prediabetes: Negative for polydipsia,polyuria, or polyphagia.  Lab Results  Component Value Date   HGBA1C 6.2 04/06/2023   Recently Dx'ed with hypothyroidism, he started Levothyroxine 50 mcg daily, which he has tolerated well.  Lab Results  Component Value Date   TSH 15.800 (H) 05/19/2023   Lab Results  Component Value Date   WBC 3.9 05/19/2023   HGB 12.7 (L) 05/19/2023   HCT 39.8 05/19/2023   MCV 89 05/19/2023   PLT 204 05/19/2023   Review of Systems  Constitutional:  Positive for fatigue. Negative for activity change, appetite change and fever.  HENT:  Negative for nosebleeds, sore throat and trouble swallowing.   Eyes:  Negative for redness and visual disturbance.  Respiratory:  Negative for cough, shortness of breath and wheezing.   Cardiovascular:  Negative for chest pain, palpitations and leg swelling.  Gastrointestinal:  Negative for abdominal pain, blood in stool, nausea and vomiting.  Endocrine: Negative for cold intolerance and heat intolerance.  Genitourinary:  Negative for decreased urine volume, dysuria, genital sores, hematuria and testicular pain.  Musculoskeletal:  Negative for back pain and myalgias.  Skin:  Negative for color change and rash.  Allergic/Immunologic: Negative for environmental allergies.  Neurological:  Negative for dizziness, syncope, weakness and headaches.  Hematological:  Negative for adenopathy. Does not bruise/bleed easily.  Psychiatric/Behavioral:  Negative for confusion. The patient is not nervous/anxious.    Current Outpatient Medications on File Prior to Visit  Medication Sig Dispense  Refill   amiodarone (PACERONE) 200 MG tablet Take 200 mg by mouth daily.     apixaban (ELIQUIS) 2.5 MG TABS tablet Take 1 tablet (2.5 mg total) by mouth 2 (two) times daily. 60 tablet 1   COD LIVER OIL PO Take 1 tablet by mouth daily.     Garlic 1000 MG CAPS Take 1,000 mg by mouth daily.     levothyroxine (SYNTHROID) 50 MCG tablet Take 1 tablet (50 mcg total) by mouth daily. 90 tablet 1   Multiple Vitamin (MULTIVITAMIN) capsule Take 1 capsule by mouth daily.     Saw Palmetto, Serenoa repens, (SAW PALMETTO PO) Take 800 mg by mouth daily. 400 mg     No current facility-administered medications on file prior to visit.   Past Medical History:  Diagnosis Date   Anemia    Atrial fibrillation (HCC)    Atrial flutter (HCC)    BPH (benign prostatic hypertrophy)    Chronic HFrEF (heart failure with reduced ejection fraction) (HCC)    Chronic kidney disease (CKD), active medical management without dialysis, stage 5 (HCC)    Diverticulosis    Hyperlipidemia    Hypertension    LBBB (left bundle branch block)     Past Surgical History:  Procedure Laterality Date   APPENDECTOMY     CARDIOVERSION N/A 10/29/2022   Procedure: CARDIOVERSION;  Surgeon: Lewayne Bunting, MD;  Location: [facility] ENDOSCOPY;  Service: Cardiovascular;  Laterality: N/A;   CARDIOVERSION N/A 05/21/2023   Procedure: CARDIOVERSION;  Surgeon: [name], MD;  Location: [facility] INVASIVE CV LAB;  Service: Cardiovascular;  Laterality: N/A;   HERNIA REPAIR     TEE WITHOUT CARDIOVERSION N/A 10/29/2022   Procedure: TRANSESOPHAGEAL ECHOCARDIOGRAM (TEE);  Surgeon: Lewayne Bunting, MD;  Location: [facility] ENDOSCOPY;  Service: Cardiovascular;  Laterality: N/A;   No Known Allergies  Family History  Problem Relation Age of Onset   Heart disease Other    Social History   Socioeconomic History   Marital status: Married    Spouse name: Not on file   Number of children: Not on file   Years of education: Not on file   Highest education  level: Not on file  Occupational History   Occupation: Retired  Tobacco Use   Smoking status: Never   Smokeless tobacco: Never  Vaping Use   Vaping status: Never Used  Substance and Sexual Activity   Alcohol use: No   Drug use: No   Sexual activity: Not on file  Other Topics Concern   Not on file  Social History Narrative   Married x 50 years    Social Determinants of Health   Financial Resource Strain: Low Risk  (07/08/2022)   Overall Financial Resource Strain (CARDIA)    Difficulty of Paying Living Expenses: Not hard at all  Food Insecurity: No Food Insecurity (02/09/2023)   Hunger Vital Sign    Worried About Running Out of Food in the Last Year: Never true    Ran Out of Food in the Last Year: Never true  Transportation Needs: No Transportation Needs (02/16/2023)   PRAPARE - Administrator, Civil Service (Medical): No    Lack of Transportation (Non-Medical): No  Physical Activity: Insufficiently Active (07/08/2022)   Exercise Vital Sign    Days of Exercise per Week: 2 days    Minutes  of Exercise per Session: 30 min  Stress: No Stress Concern Present (07/08/2022)   [employer] of Occupational Health - Occupational Stress Questionnaire    Feeling of Stress : Not at all  Social Connections: Moderately Integrated (07/02/2021)   Social Connection and Isolation Panel [NHANES]    Frequency of Communication with Friends and Family: Three times a week    Frequency of Social Gatherings with Friends and Family: Three times a week    Attends Religious Services: More than 4 times per year    Active Member of Clubs or Organizations: No    Attends Banker Meetings: Never    Marital Status: Married   Vitals:   06/16/23 1118  BP: 102/70  Pulse: 100  Resp: 16  Temp: 97.8 F (36.6 C)  SpO2: 99%   Body mass index is 19.61 kg/m.  Wt Readings from Last 3 Encounters:  06/16/23 129 lb (58.5 kg)  06/10/23 125 lb 6.4 oz (56.9 kg)  05/21/23 127 lb (57.6 kg)    Physical Exam Vitals and nursing note reviewed.  Constitutional:      General: He is not in acute distress.    Appearance: He is well-developed.  HENT:     Head: Normocephalic and atraumatic.     Right Ear: External ear normal. Tympanic membrane is not erythematous.     Left Ear: Tympanic membrane, ear canal and external ear normal.     Ears:     Comments: Right ear canal excess cerumen, TM seen partially.    Mouth/Throat:     Mouth: Mucous membranes are moist.     Dentition: Has dentures.     Pharynx: Oropharynx is clear.  Eyes:     Conjunctiva/sclera: Conjunctivae normal.     Pupils: Pupils are equal, round, and reactive to light.  Neck:     Thyroid: No thyroid mass.     Vascular: JVD present.     Trachea: No tracheal deviation.  Cardiovascular:     Rate and Rhythm: Normal rate. Rhythm irregular.     Heart sounds: No murmur heard. Pulmonary:     Effort: Pulmonary effort is normal. No respiratory distress.     Breath sounds: Normal breath sounds.  Abdominal:     Palpations: Abdomen is soft. There is no hepatomegaly or mass.     Tenderness: There is no abdominal tenderness.  Genitourinary:    Comments: No concerns. Musculoskeletal:        General: No tenderness.     Cervical back: Normal range of motion.     Comments: No signs of synovitis.  Lymphadenopathy:     Cervical: No cervical adenopathy.  Skin:    General: Skin is warm.     Findings: No erythema.  Neurological:     General: No focal deficit present.     Mental Status: He is alert and oriented to person, place, and time.     Cranial Nerves: No cranial nerve deficit.     Sensory: No sensory deficit.     Gait: Gait normal.     Deep Tendon Reflexes:     Reflex Scores:      Bicep reflexes are 2+ on the right side and 2+ on the left side.      Patellar reflexes are 2+ on the right side and 2+ on the left side. Psychiatric:        Mood and Affect: Mood and affect normal.   ASSESSMENT AND PLAN:  Mr. Ivin  was seen today  for annual exam.  Diagnoses and all orders for this visit: Routine general medical examination at a health care facility Assessment & Plan: We discussed the importance of regular physical activity and healthy diet for prevention of further chronic disease complications. Preventive guidelines reviewed. Vaccination up to date. Next CPE in a year.   Hypothyroidism, unspecified type Assessment & Plan: Continue Levothyroxine 50 mcg daily. TSH and free T4 in 07/2023.   Heart failure with reduced ejection fraction [facility]) Assessment & Plan: He is reporting no symptoms but "tired" worse with certain activities. On Furosemide 20 mg daily prn. Because e GRF 10, he is not on a SGLT2 inh and has not tolerated BB or RAS agents because BP. Continue low salt diet. He has an appt with his cardiologist tomorrow.  Orders: -     Torsemide; Take 1 tablet (20 mg total) by mouth daily as needed.  Hyperlipidemia, unspecified hyperlipidemia type Assessment & Plan: He has declined pharmacologic treatment. We discussed CV benefits of statins. He would like to have FLP added to next blood work. If he ends up been admitted to Palliative or hospice care, will cancel lab.   Atrial fibrillation, chronic (HCC) Assessment & Plan: Currently on Amiodarone 200 mg bid, which according to pt, he is supposed to decrease to once daily in a few days and Eliquis 2.5 mg bid. Following with cardiologist.   CKD (chronic kidney disease), stage V (HCC) Assessment & Plan: 05/2023 e GFR 10,BUN 105,and K+ 5.1. Follows with nephrologist, next appt in a week.   Return in 1 year (on 06/15/2024) for CPE, Labs.  Tavis Kring G. [place], MD  [facility]. [place] office.

## 2023-06-16 NOTE — Patient Instructions (Addendum)
A few things to remember from today's visit:  Hypothyroidism, unspecified type  Atrial fibrillation with RVR (HCC)  Heart failure with reduced ejection fraction (HCC) - Plan: torsemide (DEMADEX) 20 MG tablet  Chronic kidney disease, unspecified CKD stage - Plan: torsemide (DEMADEX) 20 MG tablet  Prediabetes  Primary hypertension  Routine general medical examination at a health care facility  If you need refills for medications you take chronically, please call your pharmacy. Do not use My Chart to request refills or for acute issues that need immediate attention. If you send a my chart message, it may take a few days to be addressed, specially if I am not in the office.  Please be sure medication list is accurate. If a new problem present, please set up appointment sooner than planned today.

## 2023-06-16 NOTE — Progress Notes (Unsigned)
Cardiology Office Note    Date:  06/17/2023  ID:  Douglas Matthews, DOB 1935/04/18, MRN [id number] PCP:  [place], [name], MD  Cardiologist:  Kristeen Miss, MD  Electrophysiologist:  None   Chief Complaint: f/u afib/amiodarone titration  History of Present Illness: .    Douglas Matthews is a 88 y.o. male with history of paroxysmal atrial fib/flutter, LBBB, chronic HFrEF, HTN, HLD, BPH, anemia, CKD stage 5 (not on HD) with uremia seen for follow-up.  Our team first met him in 10/2022 with admitted with PNA and found to be in atrial flutter RVR with new LBBB and HFrEF. He underwent TEE/DCCV by Dr. Jens Som to NSR. He was placed on anticoagulation and GDMT which was limited by renal dysfunction. When seen back in clinic 01/2023 he was back in afib so Dr. Elease Hashimoto recommended rate control strategy. He was readmitted later that month with a/c renal insufficiency and hyperkalemia in setting of nausea/vomiting and repeat echo showed continued significant LV dysfunction. Our team saw him again during 01/2023 admission for concern for end-stage biventricular heart failure and end-stage renal failure with recommendation for GOC discussion. He remained in afib with rate control strategy. Nephrology was following as well and he was treated with high dose IV Lasix. He was felt to be a poor candidate for HD. Palliative care saw and he was made DNR/DNI with plans for ouptatient palliative follow-up. In follow-up visit 04/2023 he remained in afib and Dr. Elease Hashimoto recommended initiation of amiodarone and pursuit of repeat DCCV. He underwent DCCV 05/2023 with reversion to NSR but in follow-up EKG 06/10/23 showed likely recurrent atrial flutter so amiodarone was further increased to 200mg  BID by Robin Searing NP. There was concern that his anticoagulant as causing itching, however, last labs 6/18 demonstrated significant uremia of 105 with Cr 5.28 which may also be contributing. It also looks like Imdur was also previously stopped  due to the itching but did not resolve off this medicine. He has not had ischemic evaluation in setting of his advance renal disease.  He is seen for follow-up with his wife. They've been married for 57 years and met in [place]. He reports that at last visit. [name] told him that he could stop taking his torsemide (which he reports he was doing 40mg  alternating with 20mg  every other day) and change to just torsemide 20mg  PRN weight gain. If anything, he's been struggling with weight loss. He drinks a lower protein Boost regularly. He has been off torsemide for about a week and has not noticed any accelerating symptoms. No CP, SOB, palpitations, edema, orthopnea. He has generalized fatigue but seems to be doing remarkably well despite his serious illness. They see Dr. [name] early next week. We had a long discussion about the definition of palliative care, as introduced in the hospital. His wife was originally concerned this meant going directly to hospice but we discussed the role of symptom management in helping to navigate complex disease processses in which we expect eventual further deterioration. They report they saw PCP yesterday, note not yet available. He still has periodic itching, comes and goes, no clear rash.    Labwork independently reviewed: 05/2023 TSH 15.8, Hgb 12.7, plt OK, K 5.1, Cr 5.28, BUN 105, LFTs ok  ROS: Please see the history of present illness.   All other systems are reviewed and otherwise negative.   Studies Reviewed: Marland Kitchen    EKG:  EKG is ordered today, personally reviewed, demonstrating atrial fib   CV  Studies: Cardiac studies reviewed are outlined and summarized above. Otherwise please see EMR for full report.  Physical Exam:    VS:  BP 108/68   Pulse 90   Ht 5\' 8"  (1.727 m)   Wt 127 lb (57.6 kg)   SpO2 99%   BMI 19.31 kg/m    Wt Readings from Last 3 Encounters:  06/17/23 127 lb (57.6 kg)  06/16/23 129 lb (58.5 kg)  06/10/23 125 lb 6.4 oz (56.9 kg)    GEN:  Well nourished, well developed in no acute distress NECK: No JVD; No carotid bruits CARDIAC: irregularly irregular, no murmurs, rubs, gallops RESPIRATORY:  Clear to auscultation without rales, wheezing or rhonchi  ABDOMEN: Soft, non-tender, non-distended EXTREMITIES:  No edema; No acute deformity   Asessement and Plan:.    1. Persistent atrial fib/flutter - discussed strategy with Dr. Elberta Fortis with EP. Given his uremia and ESRD not on HD with lab derangement, it seems unlikely that he will maintain NSR therefore we will continue with rate control strategy only. His BP has been too low to support traditional AVN blockers in the past. Will continue with amiodarone for rate control purposes. Per d/w Dr. Elberta Fortis, would continue the 200mg  BID dosing for 2 weeks as planned per 7/10 OV, then decrease to 200mg  daily with the recommendation that if his HR becomes uncontrolled, we can go back up to 200mg  BID. The main goal would be palliation of symptoms given complex, competing disease processes. He is not a candidate for other AAD given his structural heart disease, CKD, and hypotension. Continue Eliquis. Get CBC today with labs given the recent recurrent tachycardia. Dose of Eliquis remains appropriate.  2. Chronic HFrEF - very difficult situation, GDMT limited in the past by hypotension which still seems to be the case. There was previously some question that Imdur might have caused itching, but this persisted even after Imdur was stopped so I think it's more likely the uremia that has contributed to itching. Will also get CMET today. He reports his torsemide dose was adjusted at last visit from 40mg  alternating with 20mg  every other day down to 20mg  daily PRN weight gain. I do not see this documented. Nevertheless he reports he is doing well off scheduled dosing so we will hold off, though I encouraged him to have low threshold to use the medication if he develops any recurrent weight gain, SOB, or edema. Keep  f/u with nephrology as scheduled. We discussed palliative care in detail. We discussed his serious, complex illness with potential for decline. I think he would benefit from their services, with the objective being symptom management. They are in agreement. Will refer.  3. 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 Has close f/u with nephrology coming up thankfully. He reports he last saw them 3-4 months ago and that they also mentioned palliative care.  4. LBBB - follow for bradycardia, no evidence of this so far.  5. Abnormal TSH - recheck today with FT4. Anticipate further recommendations to come from primary care.     Disposition: F/u with me, Robin Searing NP, or Dr. Elease Hashimoto in 4 weeks to reassess HR control on lower dose of amiodarone.  Signed, Laurann Montana, PA-C

## 2023-06-17 ENCOUNTER — Ambulatory Visit: Payer: Medicare Other | Attending: Physician Assistant | Admitting: Physician Assistant

## 2023-06-17 ENCOUNTER — Encounter: Payer: Self-pay | Admitting: Physician Assistant

## 2023-06-17 VITALS — BP 108/68 | HR 90 | Ht 68.0 in | Wt 127.0 lb

## 2023-06-17 DIAGNOSIS — I5022 Chronic systolic (congestive) heart failure: Secondary | ICD-10-CM | POA: Insufficient documentation

## 2023-06-17 DIAGNOSIS — I447 Left bundle-branch block, unspecified: Secondary | ICD-10-CM | POA: Insufficient documentation

## 2023-06-17 DIAGNOSIS — N185 Chronic kidney disease, stage 5: Secondary | ICD-10-CM | POA: Diagnosis not present

## 2023-06-17 DIAGNOSIS — I4819 Other persistent atrial fibrillation: Secondary | ICD-10-CM | POA: Diagnosis not present

## 2023-06-17 DIAGNOSIS — R7989 Other specified abnormal findings of blood chemistry: Secondary | ICD-10-CM | POA: Diagnosis not present

## 2023-06-17 DIAGNOSIS — I4892 Unspecified atrial flutter: Secondary | ICD-10-CM | POA: Diagnosis not present

## 2023-06-17 NOTE — Patient Instructions (Addendum)
Medication Instructions:  FOR Amiodarone take 1 tablet twice a day until 06/24/23 then decrease and take 1 tablet once a day *If you need a refill on your cardiac medications before your next appointment, please call your pharmacy*   Lab Work: TODAY-CMET, CBC, TSH & FT4 If you have labs (blood work) drawn today and your tests are completely normal, you will receive your results only by: MyChart Message (if you have MyChart) OR A paper copy in the mail If you have any lab test that is abnormal or we need to change your treatment, we will call you to review the results.   Testing/Procedures: NONE ORDERED   Follow-Up: At [facility], you and your health needs are our priority.  As part of our continuing mission to provide you with exceptional heart care, we have created designated Provider Care Teams.  These Care Teams include your primary Cardiologist (physician) and Advanced Practice Providers (APPs -  Physician Assistants and Nurse Practitioners) who all work together to provide you with the care you need, when you need it.  We recommend signing up for the patient portal called "MyChart".  Sign up information is provided on this After Visit Summary.  MyChart is used to connect with patients for Virtual Visits (Telemedicine).  Patients are able to view lab/test results, encounter notes, upcoming appointments, etc.  Non-urgent messages can be sent to your provider as well.   To learn more about what you can do with MyChart, go to [email or web address].    Your next appointment:   4 week(s)  Provider:   Robin Searing, NP, Ronie Spies, PA-C or Dr Elease Hashimoto  Other Instructions You have been referred to Palliative Care.

## 2023-06-18 ENCOUNTER — Emergency Department (HOSPITAL_COMMUNITY)
Admission: EM | Admit: 2023-06-18 | Discharge: 2023-06-18 | Disposition: A | Discharge status: Home / Self Care | Payer: Medicare Other | Attending: Emergency Medicine | Admitting: Emergency Medicine

## 2023-06-18 ENCOUNTER — Emergency Department (HOSPITAL_COMMUNITY): Payer: Medicare Other

## 2023-06-18 ENCOUNTER — Other Ambulatory Visit: Payer: Self-pay

## 2023-06-18 ENCOUNTER — Telehealth: Payer: Self-pay | Admitting: Physician Assistant

## 2023-06-18 ENCOUNTER — Telehealth: Payer: Self-pay | Admitting: Family Medicine

## 2023-06-18 DIAGNOSIS — N185 Chronic kidney disease, stage 5: Secondary | ICD-10-CM | POA: Diagnosis not present

## 2023-06-18 DIAGNOSIS — J8489 Other specified interstitial pulmonary diseases: Secondary | ICD-10-CM | POA: Diagnosis not present

## 2023-06-18 DIAGNOSIS — E875 Hyperkalemia: Secondary | ICD-10-CM | POA: Diagnosis not present

## 2023-06-18 DIAGNOSIS — I509 Heart failure, unspecified: Secondary | ICD-10-CM | POA: Insufficient documentation

## 2023-06-18 DIAGNOSIS — Z79899 Other long term (current) drug therapy: Secondary | ICD-10-CM | POA: Diagnosis not present

## 2023-06-18 DIAGNOSIS — I132 Hypertensive heart and chronic kidney disease with heart failure and with stage 5 chronic kidney disease, or end stage renal disease: Secondary | ICD-10-CM | POA: Diagnosis not present

## 2023-06-18 DIAGNOSIS — Z7901 Long term (current) use of anticoagulants: Secondary | ICD-10-CM | POA: Insufficient documentation

## 2023-06-18 DIAGNOSIS — D649 Anemia, unspecified: Secondary | ICD-10-CM | POA: Insufficient documentation

## 2023-06-18 DIAGNOSIS — R7989 Other specified abnormal findings of blood chemistry: Secondary | ICD-10-CM | POA: Diagnosis present

## 2023-06-18 LAB — COMPREHENSIVE METABOLIC PANEL
ALT: 30 IU/L (ref 0–44)
ALT: 33 U/L (ref 0–44)
AST: 28 IU/L (ref 0–40)
AST: 29 U/L (ref 15–41)
Albumin: 3.9 g/dL (ref 3.7–4.7)
Albumin: 3.3 g/dL — ABNORMAL LOW (ref 3.5–5.0)
Alkaline Phosphatase: 84 IU/L (ref 44–121)
Alkaline Phosphatase: 63 U/L (ref 38–126)
Anion gap: 13 (ref 5–15)
BUN/Creatinine Ratio: 20 (ref 10–24)
BUN: 110 mg/dL (ref 8–27)
BUN: 139 mg/dL — ABNORMAL HIGH (ref 8–23)
Bilirubin Total: 0.5 mg/dL (ref 0.0–1.2)
CO2: 15 mmol/L — ABNORMAL LOW (ref 20–29)
CO2: 16 mmol/L — ABNORMAL LOW (ref 22–32)
Calcium: 9.2 mg/dL (ref 8.6–10.2)
Calcium: 8.6 mg/dL — ABNORMAL LOW (ref 8.9–10.3)
Chloride: 103 mmol/L (ref 96–106)
Chloride: 106 mmol/L (ref 98–111)
Creatinine, Ser: 5.45 mg/dL — ABNORMAL HIGH (ref 0.76–1.27)
Creatinine, Ser: 6.29 mg/dL — ABNORMAL HIGH (ref 0.61–1.24)
GFR, Estimated: 8 mL/min — ABNORMAL LOW (ref >60)
Globulin, Total: 3.1 g/dL (ref 1.5–4.5)
Glucose, Bld: 108 mg/dL — ABNORMAL HIGH (ref 70–99)
Glucose: 116 mg/dL — ABNORMAL HIGH (ref 70–99)
Potassium: 6.1 mmol/L (ref 3.5–5.2)
Potassium: 5.5 mmol/L — ABNORMAL HIGH (ref 3.5–5.1)
Sodium: 139 mmol/L (ref 134–144)
Sodium: 135 mmol/L (ref 135–145)
Total Bilirubin: 0.9 mg/dL (ref 0.3–1.2)
Total Protein: 7 g/dL (ref 6.0–8.5)
Total Protein: 6.9 g/dL (ref 6.5–8.1)
eGFR: 10 mL/min/{1.73_m2} — ABNORMAL LOW (ref >59)

## 2023-06-18 LAB — CBC WITH DIFFERENTIAL/PLATELET
Abs Immature Granulocytes: 0.02 10*3/uL (ref 0.00–0.07)
Basophils Absolute: 0 10*3/uL (ref 0.0–0.1)
Basophils Relative: 0 %
Eosinophils Absolute: 0.3 10*3/uL (ref 0.0–0.5)
Eosinophils Relative: 7 %
HCT: 32.9 % — ABNORMAL LOW (ref 39.0–52.0)
Hemoglobin: 10.5 g/dL — ABNORMAL LOW (ref 13.0–17.0)
Immature Granulocytes: 0 %
Lymphocytes Relative: 13 %
Lymphs Abs: 0.6 10*3/uL — ABNORMAL LOW (ref 0.7–4.0)
MCH: 29.8 pg (ref 26.0–34.0)
MCHC: 31.9 g/dL (ref 30.0–36.0)
MCV: 93.5 fL (ref 80.0–100.0)
Monocytes Absolute: 0.6 10*3/uL (ref 0.1–1.0)
Monocytes Relative: 13 %
Neutro Abs: 3 10*3/uL (ref 1.7–7.7)
Neutrophils Relative %: 67 %
Platelets: 153 10*3/uL (ref 150–400)
RBC: 3.52 MIL/uL — ABNORMAL LOW (ref 4.22–5.81)
RDW: 17.4 % — ABNORMAL HIGH (ref 11.5–15.5)
WBC: 4.5 10*3/uL (ref 4.0–10.5)
nRBC: 0 % (ref 0.0–0.2)

## 2023-06-18 LAB — CBC
Hematocrit: 33.4 % — ABNORMAL LOW (ref 37.5–51.0)
Hemoglobin: 10.8 g/dL — ABNORMAL LOW (ref 13.0–17.7)
MCH: 29 pg (ref 26.6–33.0)
MCHC: 32.3 g/dL (ref 31.5–35.7)
MCV: 90 fL (ref 79–97)
Platelets: 176 10*3/uL (ref 150–450)
RBC: 3.72 x10E6/uL — ABNORMAL LOW (ref 4.14–5.80)
RDW: 16.2 % — ABNORMAL HIGH (ref 11.6–15.4)
WBC: 4.1 10*3/uL (ref 3.4–10.8)

## 2023-06-18 LAB — URINALYSIS, ROUTINE W REFLEX MICROSCOPIC
Bilirubin Urine: NEGATIVE
Glucose, UA: NEGATIVE mg/dL
Hgb urine dipstick: NEGATIVE
Ketones, ur: NEGATIVE mg/dL
Leukocytes,Ua: NEGATIVE
Nitrite: NEGATIVE
Protein, ur: 30 mg/dL — AB
Specific Gravity, Urine: 1.014 (ref 1.005–1.030)
pH: 5 (ref 5.0–8.0)

## 2023-06-18 LAB — TSH+FREE T4
Free T4: 1.07 ng/dL (ref 0.82–1.77)
TSH: 24.3 u[IU]/mL — ABNORMAL HIGH (ref 0.450–4.500)

## 2023-06-18 MED ORDER — LOKELMA 5 G PO PACK: 5.0000 g | PACK | Freq: Every day | ORAL | 0 refills | Status: AC

## 2023-06-18 MED ORDER — SODIUM ZIRCONIUM CYCLOSILICATE 10 G PO PACK
10.0000 g | PACK | Freq: Once | ORAL | Status: AC
  Administered 2023-06-18: 10 g via ORAL
  Filled 2023-06-18: qty 1

## 2023-06-18 NOTE — Assessment & Plan Note (Signed)
He has declined pharmacologic treatment. We discussed CV benefits of statins. He would like to have FLP added to next blood work. If he ends up been admitted to Palliative or hospice care, will cancel lab.

## 2023-06-18 NOTE — Assessment & Plan Note (Signed)
Continue Levothyroxine 50 mcg daily. TSH and free T4 in 07/2023.

## 2023-06-18 NOTE — Telephone Encounter (Signed)
Spoke with Shea Evans, PA who advised to send patient to hospital due to CKD and past high creatinine levels (current labs still not updated in chart).  Called and spoke directly with patient who verbalized understanding of need to go to ED to have K levels re-checked and treated. States he is currently eating breakfast (verified no high K foods) then will head to [facility] emergency dept. Wife also present at time of call.

## 2023-06-18 NOTE — Assessment & Plan Note (Addendum)
Currently on Amiodarone 200 mg bid, which according to pt, he is supposed to decrease to once daily in a few days and Eliquis 2.5 mg bid. Following with cardiologist.

## 2023-06-18 NOTE — Telephone Encounter (Addendum)
Douglas Matthews with Douglas Matthews [id number]  FYI: Received referral from [facility] Pt will be receiving Palliative Care services from [facility]  Please call, if you have any further questions

## 2023-06-18 NOTE — ED Notes (Signed)
Patient transported to X-ray 

## 2023-06-18 NOTE — Telephone Encounter (Signed)
Anew Message:     Douglas Matthews is calling with a critical lab results.

## 2023-06-18 NOTE — Assessment & Plan Note (Addendum)
He is reporting no symptoms but "tired" worse with certain activities. On Furosemide 20 mg daily prn. Because e GRF 10, he is not on a SGLT2 inh and has not tolerated BB or RAS agents because BP. Continue low salt diet. He has an appt with his cardiologist tomorrow.

## 2023-06-18 NOTE — ED Triage Notes (Signed)
Pt. Stated, my Dr. Jeanene Erb said my Potassium was high to come here. No symptoms.

## 2023-06-18 NOTE — ED Provider Notes (Signed)
[place] EMERGENCY DEPARTMENT AT [facility] Provider Note   CSN: [id number] Arrival date & time: 06/18/23  0920     History  Chief Complaint  Patient presents with   abnormal labs    Douglas Matthews is a 88 y.o. male. 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  States that he went to an appointment yesterday with his cardiologist. They drew labs and then called today saying he needed to present to the ED for his high potassium. He denies having any symptoms at this time. He is not having chest pain or shortness of breath or palpitations. Denies abdominal pain, nausea, diarrhea. Wife at bedside notes that he stopped taking his torsemide at the direction of cardiology about 5 days ago. Patient makes urine.  HPI     Home Medications Prior to Admission medications   Medication Sig Start Date End Date Taking? Authorizing Provider  sodium zirconium cyclosilicate (LOKELMA) 5 g packet Take 5 g by mouth daily for 20 days. 06/18/23 07/08/23 Yes Cristopher Peru, PA-C  amiodarone (PACERONE) 200 MG tablet Take 200 mg by mouth daily.    [provider]  apixaban (ELIQUIS) 2.5 MG TABS tablet Take 1 tablet (2.5 mg total) by mouth 2 (two) times daily. 05/20/23   Gaston Islam., NP  COD LIVER OIL PO Take 1 tablet by mouth daily.    [provider]  Garlic 1000 MG CAPS Take 1,000 mg by mouth daily.    [provider]  levothyroxine (SYNTHROID) 50 MCG tablet Take 1 tablet (50 mcg total) by mouth daily. 05/28/23   [place], [name], MD  Multiple Vitamin (MULTIVITAMIN) capsule Take 1 capsule by mouth daily.    [provider]  Saw Palmetto, Serenoa repens, (SAW PALMETTO PO) Take 800 mg by mouth daily. 400 mg    [provider]  torsemide (DEMADEX) 20 MG tablet Take 1 tablet (20 mg total) by mouth daily as  needed. 06/16/23   [place], [name], MD      Allergies    Patient has no known allergies.    Review of Systems   Review of Systems  All other systems reviewed and are negative.   Physical Exam Updated Vital Signs BP 117/80 (BP Location: Right Arm)   Pulse 100   Temp 97.7 F (36.5 C)   Resp (!) 21   SpO2 100%  Physical Exam Vitals and nursing note reviewed.  Constitutional:      General: He is not in acute distress.    Appearance: Normal appearance. He is normal weight. He is not ill-appearing or toxic-appearing.  HENT:     Head: Normocephalic.  Eyes:     General: No scleral icterus. Cardiovascular:     Rate and Rhythm: Normal rate and regular rhythm.     Pulses: Normal pulses.     Heart sounds: No murmur heard. Pulmonary:     Effort: Pulmonary effort is normal. No respiratory distress.     Breath sounds: Normal breath sounds.  Abdominal:     General: Bowel sounds are normal. There is no distension.     Palpations: Abdomen is soft.     Tenderness: There is no abdominal tenderness.  Musculoskeletal:        General: Normal range of motion.     Right lower leg: Edema present.     Left lower leg:  Edema present.     Comments: Very trace lower extremity edema   Skin:    General: Skin is warm and dry.     Capillary Refill: Capillary refill takes less than 2 seconds.  Neurological:     General: No focal deficit present.     Mental Status: He is alert and oriented to person, place, and time. Mental status is at baseline.  Psychiatric:        Mood and Affect: Mood normal.        Behavior: Behavior normal.        Thought Content: Thought content normal.        Judgment: Judgment normal.     ED Results / Procedures / Treatments   Labs (all labs ordered are listed, but only abnormal results are displayed) Labs Reviewed  CBC WITH DIFFERENTIAL/PLATELET - Abnormal; Notable for the following components:      Result Value   RBC 3.52 (*)    Hemoglobin 10.5 (*)    HCT 32.9  (*)    RDW 17.4 (*)    Lymphs Abs 0.6 (*)    All other components within normal limits  COMPREHENSIVE METABOLIC PANEL - Abnormal; Notable for the following components:   Potassium 5.5 (*)    CO2 16 (*)    Glucose, Bld 108 (*)    BUN 139 (*)    Creatinine, Ser 6.29 (*)    Calcium 8.6 (*)    Albumin 3.3 (*)    GFR, Estimated 8 (*)    All other components within normal limits  URINALYSIS, ROUTINE W REFLEX MICROSCOPIC - Abnormal; Notable for the following components:   Protein, ur 30 (*)    Bacteria, UA RARE (*)    All other components within normal limits    EKG EKG Interpretation Date/Time:  Thursday June 18 2023 09:47:25 EDT Ventricular Rate:  87 PR Interval:    QRS Duration:  178 QT Interval:  444 QTC Calculation: 534 R Axis:   74  Text Interpretation: Undetermined rhythm Left bundle branch block Abnormal ECG When compared with ECG of 17-Jun-2023 13:32, PREVIOUS ECG IS PRESENT when compared to prior, overall similar appearance. more artifact. No STEMI Confirmed by Theda Belfast ([id number]) on 06/18/2023 12:21:15 PM  Radiology DG Chest 2 View  Result Date: 06/18/2023 CLINICAL DATA:  Elevated potassium. EXAM: CHEST - 2 VIEW COMPARISON:  02/04/2023 FINDINGS: The cardio pericardial silhouette is enlarged. Interstitial markings are diffusely coarsened with chronic features. No edema or pleural effusion. No acute bony abnormality. IMPRESSION: Chronic interstitial coarsening with hyperexpansion. No acute cardiopulmonary findings. Electronically Signed   By: Kennith Center M.D.   On: 06/18/2023 12:34    Procedures Procedures   Medications Ordered in ED Medications  sodium zirconium cyclosilicate (LOKELMA) packet 10 g (10 g Oral Given 06/18/23 1217)    ED Course/ Medical Decision Making/ A&P Clinical Course as of 06/18/23 1327  Thu Jun 18, 2023  1210 [place], nephrology recommends continuing to hold torsemide at home can start [facility] at home and then f/u appointment next week.   [LA]    Clinical Course User Index [LA] Cristopher Peru, PA-C    Medical Decision Making Amount and/or Complexity of Data Reviewed Labs: ordered. Radiology: ordered.  Risk Prescription drug management.  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  He is overall well appearing. Non toxic. Non septic. Younger than stated age.  Lungs are clear bilaterally. No rales. Minimal trace LE edema at the ankles.  K yesterday was 6.1. Today it is 5.5. No significant changes in his EKG from previous. He is asymptomatic.   Consulted and spoke with Dr. Arrie Aran, nephrology who recommends 10g Lokelma now, 5g daily until he sees him next week for scheduled appointment. He can continue to hold torsemide at home as well. He recommended CXR to ensure no pulmonary edema.   CXR is negative. Lokelma given. Lokelma prescribed for home. Continues to be asymptomatic. Per nephrology he sees this patient in clinic. Previous discussions the patient has expressed disinterest in initiating dialysis. They will continue this conversation next week at scheduled appointment.   Patient is agreeable to plan for dispo with medication and f/u appointment. I have given him strict return precautions for CP, SOB,  abdominal pain, nausea. He verbalized understanding.   The patient has been appropriately medically screened and/or stabilized in the ED. I have low suspicion for any other emergent medical condition which would require further screening, evaluation or treatment in the ED or require inpatient management. At time of discharge the patient is hemodynamically stable and in no acute distress. I have discussed work-up results and diagnosis with patient and answered all questions. Patient is agreeable with discharge plan. We discussed strict return precautions for returning to the emergency department and they verbalized understanding.     Patient management required discussion with the following services or consulting groups:  Nephrology  Complexity of Problems Addressed Chronic illness with exacerbation  Additional Data Reviewed and Analyzed Further history obtained from: Further history from spouse/family member, Past medical history and medications listed in the EMR, Prior ED visit notes, Recent discharge summary, and Care Everywhere  Patient Encounter Risk Assessment Prescriptions and Consideration of hospitalization  Final Clinical Impression(s) / ED Diagnoses Final diagnoses:  Hyperkalemia    Rx / DC Orders ED Discharge Orders          Ordered    sodium zirconium cyclosilicate (LOKELMA) 5 g packet  Daily        06/18/23 1324              Cristopher Peru, PA-C 06/18/23 1331    Tegeler, Canary Brim, MD 06/18/23 1517

## 2023-06-18 NOTE — Discharge Instructions (Signed)
You were seen in the emergency department today for your potassium being elevated. We gave you a medication to help lower your potassium. I have prescribed the same medication, Lokelma, for you to take starting tomorrow. You will take this daily. Please return if you begin having chest pain, shortness of breath, abdominal pain, nausea as this can be a sign of worsening kidney function, or high potassium. Please go to your nephrology appointment next week.

## 2023-06-18 NOTE — Telephone Encounter (Signed)
Returned call to [employer] [employer] and spoke with French Ana who states that pt's Potassium level is 6.1, BUN 110. Ronie Spies, PA who saw pt yesterday and ordered this is in office. Will discuss with her.

## 2023-06-18 NOTE — Assessment & Plan Note (Signed)
We discussed the importance of regular physical activity and healthy diet for prevention of further chronic disease complications. Preventive guidelines reviewed. Vaccination up to date. Next CPE in a year.

## 2023-06-18 NOTE — Assessment & Plan Note (Addendum)
05/2023 e GFR 10,BUN 105,and K+ 5.1. Follows with nephrologist, next appt in a week.

## 2023-06-18 NOTE — Telephone Encounter (Signed)
Pt wife is calling and pt was seen in ER due to high potassium and wife would like to know if he need to follow up with md. I told the wife normally he must be seen she would like the info to come from md

## 2023-06-19 NOTE — Telephone Encounter (Signed)
Would you like to see him?

## 2023-06-19 NOTE — Telephone Encounter (Signed)
FYI

## 2023-06-24 NOTE — Telephone Encounter (Signed)
Most likely he was instructed to follow with his nephrologist.We could re-check BMP here in the office in a week. Avoid foods high in potassium.  He could take his torsemide for 2-3 days. Monitor BP. Thanks, BJ

## 2023-06-24 NOTE — Telephone Encounter (Signed)
I left patient a voicemail to call the office back to schedule a lab appointment. Lab order is in.

## 2023-06-25 ENCOUNTER — Telehealth: Payer: Self-pay

## 2023-06-25 NOTE — Telephone Encounter (Signed)
Transition Care Management Unsuccessful Follow-up Telephone Call  Date of discharge and from where:  06/18/2023 [facility]  Attempts:  1st Attempt  Reason for unsuccessful TCM follow-up call:  No answer/busy   [facility]  [facility] Population Health Community Resource Care Guide   [email or web address]  ?? [phone]   Website: [email or web address]  [email or web address]

## 2023-06-25 NOTE — Telephone Encounter (Signed)
Transition Care Management Unsuccessful Follow-up Telephone Call  Date of discharge and from where:  06/18/2023 [facility]  Attempts:  2nd Attempt  Reason for unsuccessful TCM follow-up call:  No answer/busy  [facility]  [facility] Population Health Community Resource Care Guide   [email or web address]  ?? [phone]   Website: [email or web address]  [email or web address]

## 2023-06-29 ENCOUNTER — Ambulatory Visit: Payer: Medicare Other | Admitting: Physician Assistant

## 2023-06-29 DIAGNOSIS — D638 Anemia in other chronic diseases classified elsewhere: Secondary | ICD-10-CM | POA: Diagnosis not present

## 2023-06-29 DIAGNOSIS — E875 Hyperkalemia: Secondary | ICD-10-CM | POA: Diagnosis not present

## 2023-06-29 DIAGNOSIS — N185 Chronic kidney disease, stage 5: Secondary | ICD-10-CM | POA: Diagnosis not present

## 2023-06-29 DIAGNOSIS — N2581 Secondary hyperparathyroidism of renal origin: Secondary | ICD-10-CM | POA: Diagnosis not present

## 2023-06-29 DIAGNOSIS — I120 Hypertensive chronic kidney disease with stage 5 chronic kidney disease or end stage renal disease: Secondary | ICD-10-CM | POA: Diagnosis not present

## 2023-06-30 LAB — LAB REPORT - SCANNED: EGFR: 7

## 2023-07-01 ENCOUNTER — Other Ambulatory Visit (HOSPITAL_COMMUNITY): Payer: Self-pay | Admitting: *Deleted

## 2023-07-01 ENCOUNTER — Encounter (INDEPENDENT_AMBULATORY_CARE_PROVIDER_SITE_OTHER): Payer: Self-pay

## 2023-07-01 DIAGNOSIS — D649 Anemia, unspecified: Secondary | ICD-10-CM

## 2023-07-06 ENCOUNTER — Encounter (HOSPITAL_COMMUNITY)
Admission: RE | Admit: 2023-07-06 | Discharge: 2023-07-06 | Disposition: A | Discharge status: Home / Self Care | Payer: Medicare Other | Source: Ambulatory Visit | Attending: Nephrology | Admitting: Nephrology

## 2023-07-06 DIAGNOSIS — D631 Anemia in chronic kidney disease: Secondary | ICD-10-CM | POA: Insufficient documentation

## 2023-07-06 DIAGNOSIS — N189 Chronic kidney disease, unspecified: Secondary | ICD-10-CM | POA: Diagnosis not present

## 2023-07-06 MED ORDER — SODIUM CHLORIDE 0.9 % IV SOLN
510.0000 mg | Freq: Once | INTRAVENOUS | Status: AC
  Administered 2023-07-06: 510 mg via INTRAVENOUS
  Filled 2023-07-06: qty 510

## 2023-07-13 ENCOUNTER — Ambulatory Visit: Payer: Self-pay

## 2023-07-13 ENCOUNTER — Encounter (HOSPITAL_COMMUNITY): Payer: Medicare Other

## 2023-07-13 NOTE — Patient Outreach (Signed)
  Care Coordination   Follow Up Visit Note   07/13/2023 Name: GRAYLING YEPES MRN: [id number] DOB: [birth year]  Douglas Matthews is a 88 y.o. year old male who sees [place], Timoteo Expose, MD for primary care. I spoke with  [name] by phone today.  What matters to the patients health and wellness today?  Maintain health    Goals Addressed             This Visit's Progress    Kidney Disease Management       Patient Goals/Self Care Activities: -Patient/Caregiver will self-administer medications as prescribed as evidenced by self-report/primary caregiver report  -Patient/Caregiver will attend all scheduled provider appointments as evidenced by clinician review of documented attendance to scheduled appointments and patient/caregiver report -Patient/Caregiver will call provider office for new concerns or questions as evidenced by review of documented incoming telephone call notes and patient report  -Weigh daily and record (notify MD with 3 lb weight gain over night or 5 lb in a week) -Adhere to low sodium diet  Weight today 130 lbs.  Cardiology and kidney doctor  appointments recently.  Potassium has been elevated taking lokelma.  Discussed increased potassium.  Some mild swelling to ankles.  Wife reports that she will be getting him some compression socks.    Reiterated managing fluid restriction and limiting salt intake.          SDOH assessments and interventions completed:  Yes     Care Coordination Interventions:  Yes, provided   Follow up plan: Follow up call scheduled for October    Encounter Outcome:  Pt. Visit Completed   Bary Leriche, RN, MSN [facility] Care Management Care Management Coordinator Direct Line [phone]

## 2023-07-13 NOTE — Patient Instructions (Signed)
Visit Information  Thank you for taking time to visit with me today. Please don't hesitate to contact me if I can be of assistance to you.   Following are the goals we discussed today:   Goals Addressed             This Visit's Progress    Kidney Disease Management       Patient Goals/Self Care Activities: -Patient/Caregiver will self-administer medications as prescribed as evidenced by self-report/primary caregiver report  -Patient/Caregiver will attend all scheduled provider appointments as evidenced by clinician review of documented attendance to scheduled appointments and patient/caregiver report -Patient/Caregiver will call provider office for new concerns or questions as evidenced by review of documented incoming telephone call notes and patient report  -Weigh daily and record (notify MD with 3 lb weight gain over night or 5 lb in a week) -Adhere to low sodium diet  Weight today 130 lbs.  Cardiology and kidney doctor  appointments recently.  Potassium has been elevated taking lokelma.  Discussed increased potassium.  Some mild swelling to ankles.  Wife reports that she will be getting him some compression socks.    Reiterated managing fluid restriction and limiting salt intake.          Our next appointment is by telephone on 09/07/23 at 1130 am  Please call the care guide team at [phone] if you need to cancel or reschedule your appointment.   If you are experiencing a Mental Health or Behavioral Health Crisis or need someone to talk to, please call the Suicide and Crisis Lifeline: 988   Patient verbalizes understanding of instructions and care plan provided today and agrees to view in MyChart. Active MyChart status and patient understanding of how to access instructions and care plan via MyChart confirmed with patient.     The patient has been provided with contact information for the care management team and has been advised to call with any health related questions or  concerns.   Bary Leriche, RN, MSN [facility] Care Management Care Management Coordinator Direct Line [phone]

## 2023-07-14 ENCOUNTER — Telehealth (INDEPENDENT_AMBULATORY_CARE_PROVIDER_SITE_OTHER): Payer: Medicare Other | Admitting: Family Medicine

## 2023-07-14 ENCOUNTER — Encounter: Payer: Self-pay | Admitting: Family Medicine

## 2023-07-14 VITALS — BP 115/75 | Ht 68.0 in | Wt 130.0 lb

## 2023-07-14 DIAGNOSIS — Z0000 Encounter for general adult medical examination without abnormal findings: Secondary | ICD-10-CM | POA: Diagnosis not present

## 2023-07-14 NOTE — Patient Instructions (Signed)
I really enjoyed getting to talk with you today! I am available on Tuesdays and Thursdays for virtual visits if you have any questions or concerns, or if I can be of any further assistance.   CHECKLIST FROM ANNUAL WELLNESS VISIT:  -Follow up (please call to schedule if not scheduled after visit):   -yearly for annual wellness visit with primary care office  Here is a list of your preventive care/health maintenance measures and the plan for each if any are due:  PLAN For any measures below that may be due:  -updated covid and flu shots should be available in the pharmacy in the next 1-2 months  Health Maintenance  Topic Date Due   COVID-19 Vaccine (6 - 2023-24 season) 01/09/2023   INFLUENZA VACCINE  07/02/2023   Medicare Annual Wellness (AWV)  07/13/2024   DTaP/Tdap/Td (4 - Td or Tdap) 07/16/2028   Pneumonia Vaccine 66+ Years old  Completed   Zoster Vaccines- Shingrix  Completed   HPV VACCINES  Aged Out    -See a dentist at least yearly  -Get your eyes checked and then per your eye specialist's recommendations  -Other issues addressed today:   -I have included below further information regarding a healthy whole foods based diet, physical activity guidelines for adults, stress management and opportunities for social connections. I hope you find this information useful.   -----------------------------------------------------------------------------------------------------------------------------------------------------------------------------------------------------------------------------------------------------------  NUTRITION: -eat real food: lots of colorful vegetables (half the plate) and fruits -5-7 servings of vegetables and fruits per day (fresh or steamed is best), exp. 2 servings of vegetables with lunch and dinner and 2 servings of fruit per day. Berries and greens such as kale and collards are great choices.  -consume on a regular basis: whole grains (make sure first  ingredient on label contains the word "whole"), fresh fruits, fish, nuts, seeds, healthy oils (such as olive oil, avocado oil, grape seed oil) -may eat small amounts of dairy and lean meat on occasion, but avoid processed meats such as ham, bacon, lunch meat, etc. -drink water -try to avoid fast food and pre-packaged foods, processed meat -most experts advise limiting sodium to < 2300mg  per day, should limit further is any chronic conditions such as high blood pressure, heart disease, diabetes, etc. The American Heart Association advised that < 1500mg  is is ideal -try to avoid foods that contain any ingredients with names you do not recognize  -try to avoid sugar/sweets (except for the natural sugar that occurs in fresh fruit) -try to avoid sweet drinks -try to avoid white rice, white bread, pasta (unless whole grain), white or yellow potatoes  EXERCISE GUIDELINES FOR ADULTS: -if you wish to increase your physical activity, do so gradually and with the approval of your doctor -STOP and seek medical care immediately if you have any chest pain, chest discomfort or trouble breathing when starting or increasing exercise  -move and stretch your body, legs, feet and arms when sitting for long periods -Physical activity guidelines for optimal health in adults: -least 150 minutes per week of aerobic exercise (can talk, but not sing) once approved by your doctor, 20-30 minutes of sustained activity or two 10 minute episodes of sustained activity every day.  -resistance training at least 2 days per week if approved by your doctor -balance exercises 3+ days per week:   Stand somewhere where you have something sturdy to hold onto if you lose balance.    1) lift up on toes, start with 5x per day and work up to  20x   2) stand and lift on leg straight out to the side so that foot is a few inches of the floor, start with 5x each side and work up to 20x each side   3) stand on one foot, start with 5 seconds each  side and work up to 20 seconds on each side  If you need ideas or help with getting more active:  -Silver sneakers https://tools.silversneakers.com  -Walk with a Doc: http://www.duncan-[place].com/  -try to include resistance (weight lifting/strength building) and balance exercises twice per week: or the following link for ideas: http://castillo-powell.com/  BuyDucts.dk  STRESS MANAGEMENT: -can try meditating, or just sitting quietly with deep breathing while intentionally relaxing all parts of your body for 5 minutes daily -if you need further help with stress, anxiety or depression please follow up with your primary doctor or contact the wonderful folks at [facility]: [phone]  SOCIAL CONNECTIONS: -options in [place] if you wish to engage in more social and exercise related activities:  -Silver sneakers https://tools.silversneakers.com  -Walk with a Doc: http://www.duncan-[place].com/  -Check out the [facility] Active Adults 50+ section on the [place] [employer] (hiking clubs, book clubs, cards and games, chess, exercise classes, aquatic classes and much more) - see the website for details: https://www.[place].gov/departments/parks-recreation/active-adults50  -YouTube has lots of exercise videos for different ages and abilities as well  -[facility] (a variety of indoor and outdoor inperson activities for adults). 630-651-9694. [address]  -Virtual Online Classes (a variety of topics): see seniorplanet.org or call [phone]  -consider volunteering at a school, hospice center, church, senior center or elsewhere

## 2023-07-14 NOTE — Progress Notes (Signed)
PATIENT CHECK-IN and HEALTH RISK ASSESSMENT QUESTIONNAIRE:  -completed by phone/video for upcoming Medicare Preventive Visit  Pre-Visit Check-in: 1)Vitals (height, wt, BP, etc) - record in vitals section for visit on day of visit Request home vitals (wt, BP, etc.) and enter into vitals, THEN update Vital Signs SmartPhrase below at the top of the HPI. See below.  2)Review and Update Medications, Allergies PMH, Surgeries, Social history in Epic 3)Hospitalizations in the last year with date/reason? Yes potassium level was high 4)Review and Update Care Team (patient's specialists) in Epic 5) Complete PHQ9 in Epic  6) Complete Fall Screening in Epic 7)Review all Health Maintenance Due and order under PCP if not done.  Medicare Wellness Patient Questionnaire:  Answer theses question about your habits: Do you drink alcohol? no If yes, how many drinks do you have a day?na Have you ever smoked?yes Quit date if applicable? 1980  How many packs a day do/did you smoke? 2-3 cig. Do you use smokeless tobacco?no Do you use an illicit drugs?no Do you exercises? Yes IF so, what type and how many days/minutes per week?walk 10 - 15 mins 5 days a week Are you sexually active? No Number of partners?na Reports appetite has been good, trying to avoid sodium Typical breakfast - cereal, egg whites Typical lunch-  usually drinks boost for lunch  Typical dinner- baked chicken, fish Typical snacks: none  Beverages: coffee, soda  Answer theses question about you: Can you perform most household chores?yes Do you find it hard to follow a conversation in a noisy room?no Do you often ask people to speak up or repeat themselves?no Do you feel that you have a problem with memory?no Do you balance your checkbook and or bank acounts?wife does Do you feel safe at home?yes Last dentist visit? dentures Do you need assistance with any of the following: Please note if so no  Driving?  Feeding yourself?  Getting from  bed to chair?  Getting to the toilet?  Bathing or showering?  Dressing yourself?  Managing money?  Climbing a flight of stairs  Preparing meals?    Do you have Advanced Directives in place (Living Will, Healthcare Power or Attorney)? yes   Last eye Exam and location?February - Dr Sherrine Maples   Do you currently use prescribed or non-prescribed narcotic or opioid pain medications? no  Do you have a history or close family history of breast, ovarian, tubal or peritoneal cancer or a family member with BRCA (breast cancer susceptibility 1 and 2) gene mutations? no  Request home vitals (wt, BP, etc.) and enter into vitals, THEN update Vital Signs SmartPhrase below at the top of the HPI. See below.   Nurse/Assistant Credentials/time stamp:  Kern Reap CMA ----------------------------------------------------------------------------------------------------------------------------------------------------------------------------------------------------------------------  Vital Signs: Vital signs are patient reported. See vitals in exam.    MEDICARE ANNUAL PREVENTIVE CARE VISIT WITH PROVIDER (Welcome to Medicare, initial annual wellness or annual wellness exam)  Virtual Visit via Video  Note  I connected with Douglas Matthews on 07/14/23  by phone a video enabled telemedicine application and verified that I am speaking with the correct person using two identifiers.  Location patient: home Location provider:work or home office Persons participating in the virtual visit: patient, provider  Concerns and/or follow up today: reports doing great today.  Swelling is down in legs. Nurse checked in with him yesterday. Working on low sodium diet.    See HM section in Epic for other details of completed HM.    ROS: negative for report of  fevers, unintentional weight loss, vision changes, vision loss, hearing loss or change, chest pain, sob, hemoptysis, melena, hematochezia, hematuria, bleeding or  bruising, falls, thoughts of suicide or self harm, memory loss  Patient-completed extensive health risk assessment - reviewed and discussed with the patient: See Health Risk Assessment completed with patient prior to the visit either above or in recent phone note. This was reviewed in detailed with the patient today and appropriate recommendations, orders and referrals were placed as needed per Summary below and patient instructions.   Review of Medical History: -PMH, PSH, Family History and current specialty and care providers reviewed and updated and listed below   Patient Care Team: [place], [name], MD as PCP - General (Family Medicine) Nahser, Deloris Ping, MD as PCP - Cardiology (Cardiology) Fleeta Emmer, RN as [facility] Care Management   Past Medical History:  Diagnosis Date   Anemia    Atrial fibrillation [facility])    Atrial flutter (HCC)    BPH (benign prostatic hypertrophy)    Chronic HFrEF (heart failure with reduced ejection fraction) (HCC)    Chronic kidney disease (CKD), active medical management without dialysis, stage 5 (HCC)    Diverticulosis    Hyperlipidemia    Hypertension    LBBB (left bundle branch block)     Past Surgical History:  Procedure Laterality Date   APPENDECTOMY     CARDIOVERSION N/A 10/29/2022   Procedure: CARDIOVERSION;  Surgeon: Lewayne Bunting, MD;  Location: [facility] ENDOSCOPY;  Service: Cardiovascular;  Laterality: N/A;   CARDIOVERSION N/A 05/21/2023   Procedure: CARDIOVERSION;  Surgeon: [name], MD;  Location: [facility] INVASIVE CV LAB;  Service: Cardiovascular;  Laterality: N/A;   HERNIA REPAIR     TEE WITHOUT CARDIOVERSION N/A 10/29/2022   Procedure: TRANSESOPHAGEAL ECHOCARDIOGRAM (TEE);  Surgeon: Lewayne Bunting, MD;  Location: [facility] ENDOSCOPY;  Service: Cardiovascular;  Laterality: N/A;    Social History   Socioeconomic History   Marital status: Married    Spouse name: Not on file   Number of children: Not on file   Years of  education: Not on file   Highest education level: Not on file  Occupational History   Occupation: Retired  Tobacco Use   Smoking status: Never   Smokeless tobacco: Never  Vaping Use   Vaping status: Never Used  Substance and Sexual Activity   Alcohol use: No   Drug use: No   Sexual activity: Not on file  Other Topics Concern   Not on file  Social History Narrative   Married x 50 years    Social Determinants of Health   Financial Resource Strain: Low Risk  (07/08/2022)   Overall Financial Resource Strain (CARDIA)    Difficulty of Paying Living Expenses: Not hard at all  Food Insecurity: No Food Insecurity (02/09/2023)   Hunger Vital Sign    Worried About Running Out of Food in the Last Year: Never true    Ran Out of Food in the Last Year: Never true  Transportation Needs: No Transportation Needs (02/16/2023)   PRAPARE - Administrator, Civil Service (Medical): No    Lack of Transportation (Non-Medical): No  Physical Activity: Insufficiently Active (07/08/2022)   Exercise Vital Sign    Days of Exercise per Week: 2 days    Minutes of Exercise per Session: 30 min  Stress: No Stress Concern Present (07/08/2022)   [employer] of Occupational Health - Occupational Stress Questionnaire    Feeling of Stress : Not  at all  Social Connections: Moderately Integrated (07/02/2021)   Social Connection and Isolation Panel [NHANES]    Frequency of Communication with Friends and Family: Three times a week    Frequency of Social Gatherings with Friends and Family: Three times a week    Attends Religious Services: More than 4 times per year    Active Member of Clubs or Organizations: No    Attends Banker Meetings: Never    Marital Status: Married  Catering manager Violence: Not At Risk (02/04/2023)   Humiliation, Afraid, Rape, and Kick questionnaire    Fear of Current or Ex-Partner: No    Emotionally Abused: No    Physically Abused: No    Sexually Abused: No     Family History  Problem Relation Age of Onset   Heart disease Other     Current Outpatient Medications on File Prior to Visit  Medication Sig Dispense Refill   amiodarone (PACERONE) 200 MG tablet Take 200 mg by mouth daily.     apixaban (ELIQUIS) 2.5 MG TABS tablet Take 1 tablet (2.5 mg total) by mouth 2 (two) times daily. 60 tablet 1   calcitRIOL (ROCALTROL) 0.25 MCG capsule Take 0.25 mcg by mouth every other day.     COD LIVER OIL PO Take 1 tablet by mouth daily.     Garlic 1000 MG CAPS Take 1,000 mg by mouth daily.     levothyroxine (SYNTHROID) 50 MCG tablet Take 1 tablet (50 mcg total) by mouth daily. 90 tablet 1   Multiple Vitamin (MULTIVITAMIN) capsule Take 1 capsule by mouth daily.     Saw Palmetto, Serenoa repens, (SAW PALMETTO PO) Take 800 mg by mouth daily. 400 mg     sodium zirconium cyclosilicate (LOKELMA) 10 g PACK packet Take 10 g by mouth daily.     torsemide (DEMADEX) 20 MG tablet Take 1 tablet (20 mg total) by mouth daily as needed.     No current facility-administered medications on file prior to visit.    No Known Allergies     Physical Exam Vitals requested from patient and listed below if patient had equipment and was able to obtain at home for this virtual visit: Vitals:   07/14/23 1202  BP: 115/75   Estimated body mass index is 19.77 kg/m as calculated from the following:   Height as of this encounter: 5\' 8"  (1.727 m).   Weight as of this encounter: 130 lb (59 kg).  EKG (optional): deferred due to virtual visit  GENERAL: alert, oriented, no acute distress detected; full vision exam deferred due to pandemic and/or virtual encounter  HEENT: atraumatic, conjunttiva clear, no obvious abnormalities on inspection of external nose and ears  NECK: normal movements of the head and neck  LUNGS: on inspection no signs of respiratory distress, breathing rate appears normal, no obvious gross SOB, gasping or wheezing  CV: no obvious cyanosis  MS: moves  all visible extremities without noticeable abnormality  PSYCH/NEURO: pleasant and cooperative, no obvious depression or anxiety, speech and thought processing grossly intact, Cognitive function grossly intact  Flowsheet Row Video Visit from [facility]/14/2023 in [facility]  PHQ-9 Total Score 1           07/14/2023   11:33 AM 02/16/2023   11:54 AM 12/24/2022    2:19 PM 07/08/2022    2:41 PM 06/10/2022    8:55 AM  Depression screen PHQ 2/9  Decreased Interest 0 0 0 0 0  Down, Depressed, Hopeless  0 0 0 0 0  PHQ - 2 Score 0 0 0 0 0  Altered sleeping 0  0    Tired, decreased energy 1  1    Change in appetite 0  0    Feeling bad or failure about yourself  0  0    Trouble concentrating 0  0    Moving slowly or fidgety/restless 0  0    Suicidal thoughts 0  0    PHQ-9 Score 1  1    Difficult doing work/chores   Not difficult at all         01/19/2023    2:40 PM 01/21/2023    3:41 PM 02/16/2023   11:53 AM 05/25/2023   11:43 AM 07/14/2023   11:33 AM  Fall Risk  Falls in the past year? 0 0 0 0 0  Was there an injury with Fall? 0 0   0  Fall Risk Category Calculator 0 0   0  Patient at Risk for Falls Due to Impaired vision Impaired vision     Fall risk Follow up Falls evaluation completed Falls evaluation completed   Falls evaluation completed     SUMMARY AND PLAN:  Encounter for Medicare annual wellness exam    Discussed applicable health maintenance/preventive health measures and advised and referred or ordered per patient preferences: -discussed vaccines due and recommendations, he plans to get updated covid/flu vaccines in when out in the next 1-2 months  Health Maintenance  Topic Date Due   COVID-19 Vaccine (6 - 2023-24 season) 01/09/2023   INFLUENZA VACCINE  07/02/2023   Medicare Annual Wellness (AWV)  07/13/2024   DTaP/Tdap/Td (4 - Td or Tdap) 07/16/2028   Pneumonia Vaccine 22+ Years old  Completed   Zoster Vaccines- Shingrix  Completed   HPV  VACCINES  Aged [employer] and counseling on the following was provided based on the above review of health and a plan/checklist for the patient, along with additional information discussed, was provided for the patient in the patient instructions :  -Provided counseling and plan for increased risk of falling if applicable per above screening. Reviewed and demonstrated safe balance exercises that can be done at home to improve balance and discussed exercise guidelines for adults with include balance exercises at least 3 days per week.  -Advised and counseled on a healthy lifestyle - including the importance of a healthy diet, regular physical activity, social connections and stress management. -Reviewed patient's current diet. Advised and counseled on a whole foods based healthy diet. A summary of a healthy diet was provided in the Patient Instructions.  -reviewed patient's current physical activity level and discussed exercise guidelines for adults. Discussed community resources and ideas for safe exercise at home to assist in meeting exercise guideline recommendations in a safe and healthy way.  -Advise yearly dental visits at minimum and regular eye exams   Follow up: see patient instructions   Patient Instructions  I really enjoyed getting to talk with you today! I am available on Tuesdays and Thursdays for virtual visits if you have any questions or concerns, or if I can be of any further assistance.   CHECKLIST FROM ANNUAL WELLNESS VISIT:  -Follow up (please call to schedule if not scheduled after visit):   -yearly for annual wellness visit with primary care office  Here is a list of your preventive care/health maintenance measures and the plan for each if any are due:  PLAN For any measures below  that may be due:  -updated covid and flu shots should be available in the pharmacy in the next 1-2 months  Health Maintenance  Topic Date Due   COVID-19 Vaccine (6 - 2023-24 season)  01/09/2023   INFLUENZA VACCINE  07/02/2023   Medicare Annual Wellness (AWV)  07/13/2024   DTaP/Tdap/Td (4 - Td or Tdap) 07/16/2028   Pneumonia Vaccine 6+ Years old  Completed   Zoster Vaccines- Shingrix  Completed   HPV VACCINES  Aged Out    -See a dentist at least yearly  -Get your eyes checked and then per your eye specialist's recommendations  -Other issues addressed today:   -I have included below further information regarding a healthy whole foods based diet, physical activity guidelines for adults, stress management and opportunities for social connections. I hope you find this information useful.   -----------------------------------------------------------------------------------------------------------------------------------------------------------------------------------------------------------------------------------------------------------  NUTRITION: -eat real food: lots of colorful vegetables (half the plate) and fruits -5-7 servings of vegetables and fruits per day (fresh or steamed is best), exp. 2 servings of vegetables with lunch and dinner and 2 servings of fruit per day. Berries and greens such as kale and collards are great choices.  -consume on a regular basis: whole grains (make sure first ingredient on label contains the word "whole"), fresh fruits, fish, nuts, seeds, healthy oils (such as olive oil, avocado oil, grape seed oil) -may eat small amounts of dairy and lean meat on occasion, but avoid processed meats such as ham, bacon, lunch meat, etc. -drink water -try to avoid fast food and pre-packaged foods, processed meat -most experts advise limiting sodium to < 2300mg  per day, should limit further is any chronic conditions such as high blood pressure, heart disease, diabetes, etc. The American Heart Association advised that < 1500mg  is is ideal -try to avoid foods that contain any ingredients with names you do not recognize  -try to avoid sugar/sweets (except  for the natural sugar that occurs in fresh fruit) -try to avoid sweet drinks -try to avoid white rice, white bread, pasta (unless whole grain), white or yellow potatoes  EXERCISE GUIDELINES FOR ADULTS: -if you wish to increase your physical activity, do so gradually and with the approval of your doctor -STOP and seek medical care immediately if you have any chest pain, chest discomfort or trouble breathing when starting or increasing exercise  -move and stretch your body, legs, feet and arms when sitting for long periods -Physical activity guidelines for optimal health in adults: -least 150 minutes per week of aerobic exercise (can talk, but not sing) once approved by your doctor, 20-30 minutes of sustained activity or two 10 minute episodes of sustained activity every day.  -resistance training at least 2 days per week if approved by your doctor -balance exercises 3+ days per week:   Stand somewhere where you have something sturdy to hold onto if you lose balance.    1) lift up on toes, start with 5x per day and work up to 20x   2) stand and lift on leg straight out to the side so that foot is a few inches of the floor, start with 5x each side and work up to 20x each side   3) stand on one foot, start with 5 seconds each side and work up to 20 seconds on each side  If you need ideas or help with getting more active:  -Silver sneakers https://tools.silversneakers.com  -Walk with a Doc: http://www.duncan-Adamec.com/  -try to include resistance (weight lifting/strength building) and balance exercises  twice per week: or the following link for ideas: http://castillo-powell.com/  BuyDucts.dk  STRESS MANAGEMENT: -can try meditating, or just sitting quietly with deep breathing while intentionally relaxing all parts of your body for 5 minutes daily -if you need further help with stress, anxiety or depression  please follow up with your primary doctor or contact the wonderful folks at [facility]: [phone]  SOCIAL CONNECTIONS: -options in [place] if you wish to engage in more social and exercise related activities:  -Silver sneakers https://tools.silversneakers.com  -Walk with a Doc: http://www.duncan-Pritz.com/  -Check out the [facility] Active Adults 50+ section on the [place] of [employer] (hiking clubs, book clubs, cards and games, chess, exercise classes, aquatic classes and much more) - see the website for details: https://www.[place].gov/departments/parks-recreation/active-adults50  -YouTube has lots of exercise videos for different ages and abilities as well  -[facility] (a variety of indoor and outdoor inperson activities for adults). 939-037-3785. [address]  -Virtual Online Classes (a variety of topics): see seniorplanet.org or call ([phone])[phone]  -consider volunteering at a school, hospice center, church, senior center or elsewhere           Terressa Koyanagi, DO

## 2023-07-16 DIAGNOSIS — I120 Hypertensive chronic kidney disease with stage 5 chronic kidney disease or end stage renal disease: Secondary | ICD-10-CM | POA: Diagnosis not present

## 2023-07-16 DIAGNOSIS — I5082 Biventricular heart failure: Secondary | ICD-10-CM | POA: Diagnosis not present

## 2023-07-16 DIAGNOSIS — N2581 Secondary hyperparathyroidism of renal origin: Secondary | ICD-10-CM | POA: Diagnosis not present

## 2023-07-16 DIAGNOSIS — D638 Anemia in other chronic diseases classified elsewhere: Secondary | ICD-10-CM | POA: Diagnosis not present

## 2023-07-16 DIAGNOSIS — N185 Chronic kidney disease, stage 5: Secondary | ICD-10-CM | POA: Diagnosis not present

## 2023-07-16 NOTE — Progress Notes (Signed)
Office Visit    Patient Name: Douglas Matthews Date of Encounter: 07/16/2023  Primary Care Provider:  [place], [name], MD Primary Cardiologist:  Kristeen Miss, MD Primary Electrophysiologist: None   Past Medical History    Past Medical History:  Diagnosis Date   Anemia    Atrial fibrillation (HCC)    Atrial flutter (HCC)    BPH (benign prostatic hypertrophy)    Chronic HFrEF (heart failure with reduced ejection fraction) (HCC)    Chronic kidney disease (CKD), active medical management without dialysis, stage 5 (HCC)    Diverticulosis    Hyperlipidemia    Hypertension    LBBB (left bundle branch block)    Past Surgical History:  Procedure Laterality Date   APPENDECTOMY     CARDIOVERSION N/A 10/29/2022   Procedure: CARDIOVERSION;  Surgeon: Lewayne Bunting, MD;  Location: [facility] ENDOSCOPY;  Service: Cardiovascular;  Laterality: N/A;   CARDIOVERSION N/A 05/21/2023   Procedure: CARDIOVERSION;  Surgeon: [name], MD;  Location: [facility] INVASIVE CV LAB;  Service: Cardiovascular;  Laterality: N/A;   HERNIA REPAIR     TEE WITHOUT CARDIOVERSION N/A 10/29/2022   Procedure: TRANSESOPHAGEAL ECHOCARDIOGRAM (TEE);  Surgeon: Lewayne Bunting, MD;  Location: [facility] ENDOSCOPY;  Service: Cardiovascular;  Laterality: N/A;    Allergies  No Known Allergies   History of Present Illness    Douglas Matthews is a 88 year old male with a PMH of AF (on Eliquis), HTN, HLD, HFrEF, BPH, LBBB, HFpEF, anemia who presents today for follow-up.  Mr. Fairclough was seen initially in 10/2022 when he presented to the ER with complaint of shortness of breath x 2 days due to pneumonia. EKG was completed showing AF/flutter with RVR and new LBBB. Patient underwent successful TEE/DCCV and maintaining sinus rhythm postprocedure. 2D echo was completed showing EF of 30-35% with moderate biatrial enlargement with mild RVE and small pericardial effusion with mild MR and TR. He was seen in follow-up on 05/19/2023 and  unfortunately was still in atrial fibrillation. He was scheduled for DCCV and underwent procedure on 05/21/2023 with conversion to sinus rhythm.  Patient was seen on 06/10/2023 EKG showing sinus tachycardia and amiodarone was increased to 200 mg x 2 weeks and then 20 mg weekly.  He was seen by Lucile Crater, PA on 06/17/2023 with plan to increase amiodarone to 200 mg twice daily if heart rate increases but otherwise continuing 200 mg daily.  There was a further discussion regarding palliative care and GOC.  Since last being seen in the office patient reports that he has noticed improvement in his appetite denies any new cardiac complaints.  He reports resolution to his itching since his previous visit.  He was recently seen by nephrology and blood pressures were elevated with new prescription for hydralazine written.  During today's visit patient's blood pressures are controlled at 104/68 and is elevated at 118.  He is volume up on exam and has +2/3 pitting edema in lower extremities.  He continues to be asymptomatic with elevated heart rate and reports that his blood pressures have remained in the low 100s at home.  I advised him to not start hydralazine and continue to monitor blood pressures for now.  He is scheduled to meet with palliative care next month as well as Dr. Arrie Aran next month.  Patient has previously refused HD and will discuss further with nephrology at upcoming visit.  Patient denies chest pain, palpitations, dyspnea, PND, orthopnea, nausea, vomiting, dizziness, syncope, edema, weight  gain, or early satiety.   Home Medications    Current Outpatient Medications  Medication Sig Dispense Refill   amiodarone (PACERONE) 200 MG tablet Take 200 mg by mouth daily.     apixaban (ELIQUIS) 2.5 MG TABS tablet Take 1 tablet (2.5 mg total) by mouth 2 (two) times daily. 60 tablet 1   calcitRIOL (ROCALTROL) 0.25 MCG capsule Take 0.25 mcg by mouth every other day.     COD LIVER OIL PO Take 1 tablet by  mouth daily.     Garlic 1000 MG CAPS Take 1,000 mg by mouth daily.     levothyroxine (SYNTHROID) 50 MCG tablet Take 1 tablet (50 mcg total) by mouth daily. 90 tablet 1   Multiple Vitamin (MULTIVITAMIN) capsule Take 1 capsule by mouth daily.     Saw Palmetto, Serenoa repens, (SAW PALMETTO PO) Take 800 mg by mouth daily. 400 mg     sodium zirconium cyclosilicate (LOKELMA) 10 g PACK packet Take 10 g by mouth daily.     torsemide (DEMADEX) 20 MG tablet Take 1 tablet (20 mg total) by mouth daily as needed.     No current facility-administered medications for this visit.     Review of Systems  Please see the history of present illness.    (+) Bilateral pitting edema (+) Tachycardia  All other systems reviewed and are otherwise negative except as noted above.  Physical Exam    Wt Readings from Last 3 Encounters:  07/14/23 130 lb (59 kg)  07/06/23 130 lb (59 kg)  06/17/23 127 lb (57.6 kg)   TK:ZSWFU were no vitals filed for this visit.,There is no height or weight on file to calculate BMI.  Constitutional:      Appearance: Healthy appearance. Not in distress.  Neck:     Vascular: JVD normal.  Pulmonary:     Effort: Pulmonary effort is normal.     Breath sounds: No wheezing. No rales. Diminished in the bases Cardiovascular:     Irregularly irregular normal S1. Normal S2.      Murmurs: There is no murmur.  Edema:    +2 pitting edema bilaterally Abdominal:     Palpations: Abdomen is soft non tender. There is no hepatomegaly.  Skin:    General: Skin is warm and dry.  Neurological:     General: No focal deficit present.     Mental Status: Alert and oriented to person, place and time.     Cranial Nerves: Cranial nerves are intact.  EKG/LABS/ Recent Cardiac Studies    ECG personally reviewed by me today -atrial fibrillation/flutter with LBBB and 5 bpm with no acute changes consistent with previous EKG.   Risk Assessment/Calculations:    CHA2DS2-VASc Score = 4   This indicates  a 4.8% annual risk of stroke. The patient's score is based upon: CHF History: 1 HTN History: 1 Diabetes History: 0 Stroke History: 0 Vascular Disease History: 0 Age Score: 2 Gender Score: 0           Lab Results  Component Value Date   WBC 4.5 06/18/2023   HGB 10.5 (L) 06/18/2023   HCT 32.9 (L) 06/18/2023   MCV 93.5 06/18/2023   PLT 153 06/18/2023   Lab Results  Component Value Date   CREATININE 6.29 (H) 06/18/2023   BUN 139 (H) 06/18/2023   NA 135 06/18/2023   K 5.5 (H) 06/18/2023   CL 106 06/18/2023   CO2 16 (L) 06/18/2023   Lab Results  Component Value Date  ALT 33 06/18/2023   AST 29 06/18/2023   ALKPHOS 63 06/18/2023   BILITOT 0.9 06/18/2023   Lab Results  Component Value Date   CHOL 259 (H) 05/08/2021   HDL 59.40 05/08/2021   LDLCALC 182 (H) 05/08/2021   LDLDIRECT 220.5 12/15/2011   TRIG 87.0 05/08/2021   CHOLHDL 4 05/08/2021    Lab Results  Component Value Date   HGBA1C 6.2 04/06/2023     Assessment & Plan    1.1.  Atrial fibrillation:  -Patient underwent DCCV with conversion to sinus rhythm and today is sinus tachycardia with possible flutter waves noted. -We will increase amiodarone to 200 mg twice daily -Continue Eliquis 2.5 mg  2.2.  HFrEF: -Patient is volume up on examination today -Will continue torsemide 20 mg as needed -Has no complaint of shortness of breath -Low sodium diet, fluid restriction <2L, and daily weights encouraged. Educated to contact our office for weight gain of 2 lbs overnight or 5 lbs in one week.   3.Essential hypertension: -Patient's blood pressure today was controlled at 104/68 -We will continue to monitor BP for now.  4.CKD stage IV: -Patient's most recent creatinine was 7.09 and GFR was 7 -Patient previously refused hemodialysis -Patient is planned to meet with nephrology and palliative care to discuss treatment plan and GOC.  5.  Hyperkalemia: -Patient recently completed Lokelma 5 g and had labs  completed by nephrology yesterday.   Disposition: Follow-up with Kristeen Miss, MD or APP in 2 months    Medication Adjustments/Labs and Tests Ordered: Current medicines are reviewed at length with the patient today.  Concerns regarding medicines are outlined above.   Signed, Napoleon Form, Leodis Rains, NP 07/16/2023, 5:45 PM [facility]

## 2023-07-17 ENCOUNTER — Ambulatory Visit: Payer: Medicare Other | Attending: Nurse Practitioner | Admitting: Nurse Practitioner

## 2023-07-17 ENCOUNTER — Encounter: Payer: Self-pay | Admitting: Nurse Practitioner

## 2023-07-17 VITALS — BP 104/68 | HR 118 | Ht 68.0 in | Wt 141.2 lb

## 2023-07-17 DIAGNOSIS — I10 Essential (primary) hypertension: Secondary | ICD-10-CM

## 2023-07-17 DIAGNOSIS — I5020 Unspecified systolic (congestive) heart failure: Secondary | ICD-10-CM

## 2023-07-17 DIAGNOSIS — E875 Hyperkalemia: Secondary | ICD-10-CM | POA: Diagnosis not present

## 2023-07-17 DIAGNOSIS — I482 Chronic atrial fibrillation, unspecified: Secondary | ICD-10-CM | POA: Diagnosis not present

## 2023-07-17 DIAGNOSIS — N185 Chronic kidney disease, stage 5: Secondary | ICD-10-CM | POA: Diagnosis not present

## 2023-07-17 LAB — LAB REPORT - SCANNED
Creatinine, POC: 51.4 mg/dL
EGFR: 9

## 2023-07-17 MED ORDER — AMIODARONE HCL 200 MG PO TABS: 200.0000 mg | ORAL_TABLET | Freq: Two times a day (BID) | ORAL | 2 refills | Status: DC

## 2023-07-17 NOTE — Patient Instructions (Signed)
Medication Instructions:  INCREASE AMIODARONE TO 200MG  Take 1 tablet twice a day  *If you need a refill on your cardiac medications before your next appointment, please call your pharmacy*   Lab Work: None ordered   Testing/Procedures: None ordered   Follow-Up: At [facility], you and your health needs are our priority.  As part of our continuing mission to provide you with exceptional heart care, we have created designated Provider Care Teams.  These Care Teams include your primary Cardiologist (physician) and Advanced Practice Providers (APPs -  Physician Assistants and Nurse Practitioners) who all work together to provide you with the care you need, when you need it.  We recommend signing up for the patient portal called "MyChart".  Sign up information is provided on this After Visit Summary.  MyChart is used to connect with patients for Virtual Visits (Telemedicine).  Patients are able to view lab/test results, encounter notes, upcoming appointments, etc.  Non-urgent messages can be sent to your provider as well.   To learn more about what you can do with MyChart, go to [email or web address].    Your next appointment:   2 month(s)  Provider:   Kristeen Miss, MD  or Robin Searing, NP   Other Instructions Check your blood pressure twice a day your goal readings are 130/80

## 2023-08-05 NOTE — Progress Notes (Unsigned)
ACUTE VISIT Chief Complaint  Patient presents with   Tremors    Bilateral leg tremors x 2 weeks   HPI: Mr.Elizar E Marsella is a 88 y.o. male, who is here today complaining of *** HPI Lab Results  Component Value Date   NA 135 06/18/2023   CL 106 06/18/2023   K 5.5 (H) 06/18/2023   CO2 16 (L) 06/18/2023   BUN 139 (H) 06/18/2023   CREATININE 6.29 (H) 06/18/2023   EGFR 9.0 07/16/2023   CALCIUM 8.6 (L) 06/18/2023   ALBUMIN 3.3 (L) 06/18/2023   GLUCOSE 108 (H) 06/18/2023    Review of Systems See other pertinent positives and negatives in HPI.  Current Outpatient Medications on File Prior to Visit  Medication Sig Dispense Refill   amiodarone (PACERONE) 200 MG tablet Take 1 tablet (200 mg total) by mouth 2 (two) times daily. 60 tablet 2   apixaban (ELIQUIS) 2.5 MG TABS tablet Take 1 tablet (2.5 mg total) by mouth 2 (two) times daily. 60 tablet 1   calcitRIOL (ROCALTROL) 0.25 MCG capsule Take 0.25 mcg by mouth every other day.     COD LIVER OIL PO Take 1 tablet by mouth daily.     Garlic 1000 MG CAPS Take 1,000 mg by mouth daily.     levothyroxine (SYNTHROID) 50 MCG tablet Take 1 tablet (50 mcg total) by mouth daily. 90 tablet 1   Multiple Vitamin (MULTIVITAMIN) capsule Take 1 capsule by mouth daily.     sodium zirconium cyclosilicate (LOKELMA) 10 g PACK packet Take 10 g by mouth daily.     torsemide (DEMADEX) 20 MG tablet Take 1 tablet (20 mg total) by mouth daily as needed.     Saw Palmetto, Serenoa repens, (SAW PALMETTO PO) Take 800 mg by mouth daily. 400 mg (Patient not taking: Reported on 08/07/2023)     No current facility-administered medications on file prior to visit.    Past Medical History:  Diagnosis Date   Anemia    Atrial fibrillation (HCC)    Atrial flutter (HCC)    BPH (benign prostatic hypertrophy)    Chronic HFrEF (heart failure with reduced ejection fraction) (HCC)    Chronic kidney disease (CKD), active medical management without dialysis, stage 5 (HCC)     Diverticulosis    Hyperlipidemia    Hypertension    LBBB (left bundle branch block)    No Known Allergies  Social History   Socioeconomic History   Marital status: Married    Spouse name: Not on file   Number of children: Not on file   Years of education: Not on file   Highest education level: Not on file  Occupational History   Occupation: Retired  Tobacco Use   Smoking status: Never   Smokeless tobacco: Never  Vaping Use   Vaping status: Never Used  Substance and Sexual Activity   Alcohol use: No   Drug use: No   Sexual activity: Not on file  Other Topics Concern   Not on file  Social History Narrative   Married x 50 years    Social Determinants of Health   Financial Resource Strain: Low Risk  (07/08/2022)   Overall Financial Resource Strain (CARDIA)    Difficulty of Paying Living Expenses: Not hard at all  Food Insecurity: No Food Insecurity (02/09/2023)   Hunger Vital Sign    Worried About Running Out of Food in the Last Year: Never true    Ran Out of Food in the Last Year:  Never true  Transportation Needs: No Transportation Needs (02/16/2023)   PRAPARE - Administrator, Civil Service (Medical): No    Lack of Transportation (Non-Medical): No  Physical Activity: Insufficiently Active (07/08/2022)   Exercise Vital Sign    Days of Exercise per Week: 2 days    Minutes of Exercise per Session: 30 min  Stress: No Stress Concern Present (07/08/2022)   [employer] of Occupational Health - Occupational Stress Questionnaire    Feeling of Stress : Not at all  Social Connections: Moderately Integrated (07/02/2021)   Social Connection and Isolation Panel [NHANES]    Frequency of Communication with Friends and Family: Three times a week    Frequency of Social Gatherings with Friends and Family: Three times a week    Attends Religious Services: More than 4 times per year    Active Member of Clubs or Organizations: No    Attends Banker Meetings:  Never    Marital Status: Married    Vitals:   08/07/23 1029  BP: 120/70  Pulse: 95  Resp: 16  Temp: 97.6 F (36.4 C)  SpO2: 97%   Body mass index is 21.44 kg/m.  Physical Exam Vitals and nursing note reviewed.  Constitutional:      General: He is not in acute distress.    Appearance: He is well-developed.  HENT:     Head: Normocephalic and atraumatic.  Eyes:     Conjunctiva/sclera: Conjunctivae normal.  Cardiovascular:     Rate and Rhythm: Normal rate. Rhythm irregular.     Heart sounds: No murmur heard.    Comments: DP pulses palpable. Peri-ankle and LE bilateral pitting edema, 1+ Pulmonary:     Effort: Pulmonary effort is normal. No respiratory distress.     Breath sounds: Normal breath sounds.  Abdominal:     Palpations: Abdomen is soft. There is no mass.     Tenderness: There is no abdominal tenderness.  Musculoskeletal:     Right lower leg: No edema.     Left lower leg: No edema.  Skin:    General: Skin is warm.     Findings: No erythema or rash.  Neurological:     Mental Status: He is alert and oriented to person, place, and time.     Cranial Nerves: No cranial nerve deficit.     Gait: Gait normal.  Psychiatric:        Mood and Affect: Mood and affect normal.    ASSESSMENT AND PLAN: Abnormal movement of lower extremity -     Basic metabolic panel  CKD (chronic kidney disease), stage V (HCC) -     Basic metabolic panel  Hypothyroidism, unspecified type -     TSH -     T4, free    Return if symptoms worsen or fail to improve, for keep next appointment.  Carly Sabo G. [place], MD  [facility]. [place] office.

## 2023-08-07 ENCOUNTER — Encounter: Payer: Self-pay | Admitting: Family Medicine

## 2023-08-07 ENCOUNTER — Ambulatory Visit (INDEPENDENT_AMBULATORY_CARE_PROVIDER_SITE_OTHER): Payer: Medicare Other | Admitting: Family Medicine

## 2023-08-07 ENCOUNTER — Other Ambulatory Visit: Payer: Self-pay

## 2023-08-07 ENCOUNTER — Telehealth: Payer: Self-pay

## 2023-08-07 VITALS — BP 120/70 | HR 95 | Temp 97.6°F | Resp 16 | Ht 68.0 in | Wt 141.0 lb

## 2023-08-07 DIAGNOSIS — N185 Chronic kidney disease, stage 5: Secondary | ICD-10-CM | POA: Diagnosis not present

## 2023-08-07 DIAGNOSIS — R600 Localized edema: Secondary | ICD-10-CM | POA: Diagnosis not present

## 2023-08-07 DIAGNOSIS — G259 Extrapyramidal and movement disorder, unspecified: Secondary | ICD-10-CM

## 2023-08-07 DIAGNOSIS — E039 Hypothyroidism, unspecified: Secondary | ICD-10-CM | POA: Diagnosis not present

## 2023-08-07 LAB — BASIC METABOLIC PANEL
BUN: 121 mg/dL (ref 6–23)
CO2: 26 meq/L (ref 19–32)
Calcium: 8.8 mg/dL (ref 8.4–10.5)
Chloride: 102 meq/L (ref 96–112)
Creatinine, Ser: 6.92 mg/dL (ref 0.40–1.50)
GFR: 6.72 mL/min — CL (ref >60.00)
Glucose, Bld: 113 mg/dL — ABNORMAL HIGH (ref 70–99)
Potassium: 4.6 meq/L (ref 3.5–5.1)
Sodium: 141 meq/L (ref 135–145)

## 2023-08-07 LAB — TSH: TSH: 64.01 u[IU]/mL — ABNORMAL HIGH (ref 0.35–5.50)

## 2023-08-07 LAB — T4, FREE: Free T4: 0.72 ng/dL (ref 0.60–1.60)

## 2023-08-07 MED ORDER — LEVOTHYROXINE SODIUM 75 MCG PO TABS: 75.0000 ug | ORAL_TABLET | Freq: Every day | ORAL | 1 refills | Status: DC

## 2023-08-07 NOTE — Patient Instructions (Addendum)
A few things to remember from today's visit:  Abnormal movement of lower extremity - Plan: Basic Metabolic Panel  CKD (chronic kidney disease), stage V (HCC) - Plan: Basic Metabolic Panel  Hypothyroidism, unspecified type - Plan: TSH, T4, free  Legs shaking can be due to some of chronic medical problems or medications. Fall prevention is the main goal. Monitor for new symptoms. Keep next appt with your nephrologist.  If you need refills for medications you take chronically, please call your pharmacy. Do not use My Chart to request refills or for acute issues that need immediate attention. If you send a my chart message, it may take a few days to be addressed, specially if I am not in the office.  Please be sure medication list is accurate. If a new problem present, please set up appointment sooner than planned today.

## 2023-08-07 NOTE — Telephone Encounter (Signed)
CRITICAL VALUE STICKER  CRITICAL VALUE: BUN 121, Cr 6.92, GFR 6.72  RECEIVER (on-site recipient of call): Maralyn Sago, CMA  DATE & TIME NOTIFIED: 08/07/23 2:44PM  MESSENGER (representative from lab):Saa  MD NOTIFIED: Yes  TIME OF NOTIFICATION: 2:45PM  RESPONSE: aware it was going to be critical - pt being followed by nephrology.

## 2023-08-07 NOTE — Telephone Encounter (Signed)
See result note.  

## 2023-08-12 DIAGNOSIS — Z23 Encounter for immunization: Secondary | ICD-10-CM | POA: Diagnosis not present

## 2023-08-14 ENCOUNTER — Telehealth: Payer: Self-pay | Admitting: Cardiovascular Disease

## 2023-08-14 NOTE — Telephone Encounter (Signed)
Pt's spouse [name] is requesting a callback since pt's been having some trembling in his leg for about 2 weeks now and he'd like to be advised on what to do. Please advise

## 2023-08-17 DIAGNOSIS — Z23 Encounter for immunization: Secondary | ICD-10-CM | POA: Diagnosis not present

## 2023-08-17 NOTE — Telephone Encounter (Signed)
Returned call to wife [name] who states patient has had trembling in both legs intermittently when he tries to get up and walk. Went to PCP 08/07/23 for this, labs were done, all was normal (K, Ca, Na), except for TSH which was elevated so synthroid dosing was adjusted. Patient states he is staying hydrated and is doing some physical activity such as going to grocery store, daily walking-not just sitting. Advised they check back with PCP for potentially restless leg syndrome, but that since he states it primarily happens first thing in the morning, it could be due to him being in the same (sleeping) position for several hours. Encouraged getting up and walking and using the muscles in his legs when this happens to see if it alleviates some of this. She will also check back with PCP if it doesn't.

## 2023-08-19 ENCOUNTER — Other Ambulatory Visit: Payer: Self-pay | Admitting: Cardiovascular Disease

## 2023-08-19 ENCOUNTER — Other Ambulatory Visit: Payer: Self-pay | Admitting: Family Medicine

## 2023-08-19 DIAGNOSIS — I5020 Unspecified systolic (congestive) heart failure: Secondary | ICD-10-CM

## 2023-08-21 ENCOUNTER — Other Ambulatory Visit: Payer: Self-pay | Admitting: Nurse Practitioner

## 2023-08-21 ENCOUNTER — Other Ambulatory Visit: Payer: Self-pay | Admitting: Cardiovascular Disease

## 2023-08-21 DIAGNOSIS — I4892 Unspecified atrial flutter: Secondary | ICD-10-CM

## 2023-08-21 DIAGNOSIS — I482 Chronic atrial fibrillation, unspecified: Secondary | ICD-10-CM

## 2023-08-24 ENCOUNTER — Other Ambulatory Visit: Payer: Self-pay

## 2023-08-26 ENCOUNTER — Telehealth: Payer: Self-pay | Admitting: Family Medicine

## 2023-08-26 DIAGNOSIS — G259 Extrapyramidal and movement disorder, unspecified: Secondary | ICD-10-CM

## 2023-08-26 NOTE — Telephone Encounter (Signed)
Says patient's Leg trembling was discussed, asking should be go to a neurologist and if so would like a recommendation.

## 2023-08-31 ENCOUNTER — Encounter: Payer: Self-pay | Admitting: Neurology

## 2023-08-31 DIAGNOSIS — N2581 Secondary hyperparathyroidism of renal origin: Secondary | ICD-10-CM | POA: Diagnosis not present

## 2023-08-31 DIAGNOSIS — I120 Hypertensive chronic kidney disease with stage 5 chronic kidney disease or end stage renal disease: Secondary | ICD-10-CM | POA: Diagnosis not present

## 2023-08-31 DIAGNOSIS — I5082 Biventricular heart failure: Secondary | ICD-10-CM | POA: Diagnosis not present

## 2023-08-31 DIAGNOSIS — N185 Chronic kidney disease, stage 5: Secondary | ICD-10-CM | POA: Diagnosis not present

## 2023-08-31 DIAGNOSIS — D638 Anemia in other chronic diseases classified elsewhere: Secondary | ICD-10-CM | POA: Diagnosis not present

## 2023-08-31 NOTE — Addendum Note (Signed)
Addended by: Kathreen Devoid on: 08/31/2023 10:19 AM   Modules accepted: Orders

## 2023-08-31 NOTE — Telephone Encounter (Signed)
It is ok to have a neuro consultation for involuntary LE movement. Thanks, BJ

## 2023-08-31 NOTE — Telephone Encounter (Signed)
I spoke with patient's wife, she is aware we have placed a referral to neurology and they will them to get them scheduled.

## 2023-09-01 LAB — LAB REPORT - SCANNED
Creatinine, POC: 63.2 mg/dL
EGFR: 7

## 2023-09-02 ENCOUNTER — Telehealth: Payer: Self-pay | Admitting: Cardiovascular Disease

## 2023-09-02 NOTE — Telephone Encounter (Signed)
Spoke with pt's wife, DPR who reports lower extremity weakness and falls x 2 months..  She reports pt fell today in the grass but did not hit his head or sustain any injuries.  Pt's wife states she feels this has worsened since Amiodarone was increased by [name], NP 07/17/2023.  Pt has been evaluated by his PCP and a neurology referral placed.  Pt's wife would like to know if this could possibly be a side effect of his medication. Pt's wife advised will forward to our pharmacy team and Robin Searing, NP.  Pt is due to see NP again 09/16/2023.  Pt's wife verbalizes understanding and agrees with current plan.

## 2023-09-02 NOTE — Telephone Encounter (Signed)
Pt c/o medication issue:  1. Name of Medication:   amiodarone (PACERONE) 200 MG tablet   2. How are you currently taking this medication (dosage and times per day)?  As prescribed  3. Are you having a reaction (difficulty breathing--STAT)?   Falling, weakness in legs, no appetite  4. What is your medication issue?   Wife [name]) stated patient has been falling, having weakness in his legs and no appetite while on this medication .  Wife wants a call back to discuss an alternate medication or lowering the dose of this medication.

## 2023-09-03 ENCOUNTER — Telehealth: Payer: Self-pay | Admitting: Family Medicine

## 2023-09-03 MED ORDER — AMIODARONE HCL 200 MG PO TABS: 200.0000 mg | ORAL_TABLET | Freq: Every day | ORAL | 3 refills | Status: DC

## 2023-09-03 NOTE — Telephone Encounter (Signed)
Dee, Palliative Care Nurse with [facility] [phone] Would like to evaluate Pt for Physical Therapy at home [facility])  Geraldine Contras informed MD is OOO today.  Please return her call, at your earliest convenience.

## 2023-09-03 NOTE — Telephone Encounter (Signed)
Nahser, Deloris Ping, MD  Gaston Islam., NP19 minutes ago (9:03 AM)    Yes, I would support reducing his dose down to where he tolerated it previously   PN   Louanne Skye Devoria Albe., NP  Nahser, Deloris Ping, MD39 minutes ago (8:44 AM)    Dr. Elease Hashimoto do you recommend reducing amiodarone back to 200 once a day to see if symptoms alleviate?  He was unable to tolerate AV nodal agents and will structural heart disease is not a candidate for other antiarrhythmics.  Thanks Robin Searing, Devoria Albe., NP  [facility] Triage6 minutes ago (9:17 AM)    Please contact Mr. Lemoine and advise him that I discussed his medications with Dr. Elease Hashimoto and we both recommend reducing amiodarone down to 200 mg once daily to see if symptoms resolve.  Please let us know if you have any further questions.  _____________________________________________________________   Jeanene Erb and let the patient's wife know.  Verbalizes understanding and agreement.  Medication list updated.

## 2023-09-03 NOTE — Telephone Encounter (Signed)
Yes, weakness and instability can be a side effect of amiodarone.  Neurologic: Abnormal gait (4% to 9% ), Coordination problem (4% to 9% ), Dizziness (4% to 9% ), Involuntary movement (4% to 9% ), Movement disorder (4% to 9% ), Paresthesia (4% to 9% ), Peripheral neuropathy

## 2023-09-04 NOTE — Telephone Encounter (Signed)
It is okay to give verbal authorization to requested services. Thanks, BJ 

## 2023-09-04 NOTE — Progress Notes (Unsigned)
Assessment/Plan:   1.  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  He is not apparently a candidate for hemodialysis.  He and I discussed the nature of myoclonus.  At this point in time, I would not recommend medication for this.  Very little is actually helpful.  Clonazepam can be, but it can also elicit falls.  -Discussed with patient that amiodarone can elicit tremor, but I really did not see any significant degree of tremor in him.  His cardiologist has already tried to decrease the amiodarone because of the symptoms.  He and I discussed that if it was because amiodarone, symptoms can take several months to resolve.   -We will go ahead and do an MRI of the brain.  I told him we would most certainly see small vessel disease and atrophy, but we want to make sure we are not missing anything else.  -Discussed with patient that I do not see any parkinsonian features.  We discussed a DaTscan, but both of [place] decided against it today. 2.  A-fib  -on amiodarone  -on eliquis, which would make him not a candidate for other tremor medications like primidone 3.  As long as the above is fairly nonrevealing, we will follow-up on an as-needed basis. Subjective:   Douglas Matthews was seen today in neurologic consultation at the request of [place], Timoteo Expose, MD.  The consultation is for the evaluation of lower extremity tremor.  Pt with wife who supplements hx.  This was not noted on primary care examination.  Per primary care records, patient complained of it after getting into bed or when he was seated.  Primary care records indicate patient was on amiodarone and had TSH of 64 and is on levothyroxine.  Patient's creatinine is 6.92.  He is followed by nephrology but is not on hemodialysis and not felt to be a candidate for such, per September 30 records from [facility].  Patient is being followed by palliative care.  Pt  states that he has tremor in the feet>hands.  Its in both sides.  The tremor in the legs is mostly when he is trying to do something like put his foot in his shoe.  Doesn't happen with laying down.  It comes and goes throughout the day.  He has had it x 1 month.  They decreased the amiodarone around that time to see if it would help, and he thinks that it may have helped his stability when they decreased it.  He has trouble describing if it is rhythmic or if it is a jerking motion, but tries to mimic it and it did not appear to have a good rhythm. Family hx of similar:  No. Voice: no per pt; wife thinks that he's a bit softer/less clear Sleep: sleeps well  Vivid Dreams:  No.  Acting out dreams:  No. Wet Pillows: Yes.  , just when sitting up sleeping Postural symptoms:  Yes.    Falls?  Yes.  , had a fall last week getting out of car and lost balance and went sideways and fell.  Had to get help to get off of the ground.  About a week before that, he had a fall in the living room.  He was standing there and suddenly fell without warning (no dizziness).  Son and wife helped him up.   Bradykinesia symptoms: difficulty getting out of a chair (unsure why this is); "  my feet feel stuck to the floor" Loss of smell:  No. Loss of taste:  Yes.   Urinary Incontinence:  No. But has urgency Difficulty Swallowing:  No. Handwriting, micrographia: Yes.   Trouble with ADL's:  Yes.  , needs help with pants/socks  Trouble buttoning clothing: Yes.   Depression:  No. But admits to anxiety Memory changes:  No. N/V:  No. Lightheaded:  No.  Syncope: No. Diplopia:  No. Dyskinesia:  No.    Neuroimaging has not previously been performed.    PREVIOUS MEDICATIONS: None  ALLERGIES:  No Known Allergies  CURRENT MEDICATIONS:  Outpatient Encounter Medications as of 09/08/2023  Medication Sig   amiodarone (PACERONE) 200 MG tablet Take 1 tablet (200 mg total) by mouth daily.   apixaban (ELIQUIS) 2.5 MG TABS tablet  Take 1 tablet (2.5 mg total) by mouth 2 (two) times daily.   calcitRIOL (ROCALTROL) 0.25 MCG capsule Take 0.25 mcg by mouth every other day.   COD LIVER OIL PO Take 1 tablet by mouth daily.   Garlic 1000 MG CAPS Take 1,000 mg by mouth daily.   levothyroxine (SYNTHROID) 75 MCG tablet Take 1 tablet (75 mcg total) by mouth daily.   Multiple Vitamin (MULTIVITAMIN) capsule Take 1 capsule by mouth daily.   sodium zirconium cyclosilicate (LOKELMA) 10 g PACK packet Take 10 g by mouth daily.   torsemide (DEMADEX) 20 MG tablet Take 1 tablet (20 mg total) by mouth daily.   [DISCONTINUED] Saw Palmetto, Serenoa repens, (SAW PALMETTO PO) Take 800 mg by mouth daily. 400 mg (Patient not taking: Reported on 08/07/2023)   No facility-administered encounter medications on file as of 09/08/2023.    Objective:   PHYSICAL EXAMINATION:    VITALS:   Vitals:   09/08/23 1318  BP: 102/65  Pulse: 86  SpO2: 97%  Weight: 121 lb 9.6 oz (55.2 kg)  Height: 5\' 8"  (1.727 m)    GEN:  Normal appears male in no acute distress.  Appears stated age. HEENT:  Normocephalic, atraumatic. The mucous membranes are moist. The superficial temporal arteries are without ropiness or tenderness. Cardiovascular: Regular rate and rhythm. Lungs: Clear to auscultation bilaterally. Neck/Heme: There are no carotid bruits noted bilaterally.  NEUROLOGICAL: Orientation:  The patient is alert and oriented x 3.   Cranial nerves: There is good facial symmetry.  Extraocular muscles are intact and visual fields are full to confrontational testing. Speech is fluent and clear. Soft palate rises symmetrically and there is no tongue deviation. Hearing is intact to conversational tone. Tone: Tone is good throughout. Sensation: Sensation is intact to light touch and pinprick throughout (facial, trunk, extremities). Vibration is intact at the bilateral big toe. There is no extinction with double simultaneous stimulation. There is no sensory dermatomal  level identified. Coordination:  The patient has good rams with the exception of slowness of toe taps on the L.  All other RAMs with any form of RAMS, including alternating supination and pronation of the forearm, hand opening and closing, finger taps, heel taps are good bilaterally Motor: Strength is 5/5 in the bilateral upper and lower extremities.  Shoulder shrug is equal and symmetric. There is no pronator drift.  There are no fasciculations noted. DTR's: Deep tendon reflexes are 2-/4 at the bilateral biceps, triceps, brachioradialis, patella and achilles.  Plantar responses are downgoing bilaterally. Gait and Station: The patient is pushes off to arise.  He is slightly wide-based.  He does not shuffle. Abnormal movements:  no rest or postural tremor.  No intention tremor.  No tremor with finger-to-nose.  No ankle clonus.  He has mild mini-myoclonus in the legs when approaching his shoes to get them on.    Total time spent on today's visit was 60 minutes, including both face-to-face time and nonface-to-face time.  Time included that spent on review of records (prior notes available to me/labs/imaging if pertinent), discussing treatment and goals, answering patient's questions and coordinating care.   Cc:  [place], [name], MD

## 2023-09-04 NOTE — Telephone Encounter (Signed)
Verbal given to [facility].

## 2023-09-07 ENCOUNTER — Ambulatory Visit: Payer: Self-pay

## 2023-09-07 NOTE — Patient Instructions (Signed)
Visit Information  Thank you for taking time to visit with me today. Please don't hesitate to contact me if I can be of assistance to you.   Following are the goals we discussed today:   Goals Addressed             This Visit's Progress    Kidney Disease Management       Patient Goals/Self Care Activities: -Patient/Caregiver will self-administer medications as prescribed as evidenced by self-report/primary caregiver report  -Patient/Caregiver will attend all scheduled provider appointments as evidenced by clinician review of documented attendance to scheduled appointments and patient/caregiver report -Patient/Caregiver will call provider office for new concerns or questions as evidenced by review of documented incoming telephone call notes and patient report  -Weigh daily and record (notify MD with 3 lb weight gain over night or 5 lb in a week) -Adhere to low sodium diet  Weight today 131 lbs.  Patient reports doing good. Maintaining his health.  Discussed heart failure and fluid intake.  Patient continues to weigh daily.  No concerns.          Our next appointment is by telephone on 11/09/23 at 1130 am  Please call the care guide team at [phone] if you need to cancel or reschedule your appointment.   If you are experiencing a Mental Health or Behavioral Health Crisis or need someone to talk to, please call the Suicide and Crisis Lifeline: 988   Patient verbalizes understanding of instructions and care plan provided today and agrees to view in MyChart. Active MyChart status and patient understanding of how to access instructions and care plan via MyChart confirmed with patient.     The patient has been provided with contact information for the care management team and has been advised to call with any health related questions or concerns.   Bary Leriche, RN, MSN [facility], [facility] Management Community Coordinator Direct Dial:  [phone]  Fax: [phone] Website: [email or web address]

## 2023-09-07 NOTE — Patient Outreach (Signed)
Care Coordination   Follow Up Visit Note   09/07/2023 Name: Douglas Matthews MRN: [id number] DOB: Apr 29, 1935  Douglas Matthews is a 88 y.o. year old male who sees [place], Timoteo Expose, MD for primary care. I spoke with  [name] by phone today.  What matters to the patients health and wellness today?  Maintain health    Goals Addressed             This Visit's Progress    Kidney Disease Management       Patient Goals/Self Care Activities: -Patient/Caregiver will self-administer medications as prescribed as evidenced by self-report/primary caregiver report  -Patient/Caregiver will attend all scheduled provider appointments as evidenced by clinician review of documented attendance to scheduled appointments and patient/caregiver report -Patient/Caregiver will call provider office for new concerns or questions as evidenced by review of documented incoming telephone call notes and patient report  -Weigh daily and record (notify MD with 3 lb weight gain over night or 5 lb in a week) -Adhere to low sodium diet  Weight today 131 lbs.  Patient reports doing good. Maintaining his health.  Discussed heart failure and fluid intake.  Patient continues to weigh daily.  No concerns.          SDOH assessments and interventions completed:  Yes  SDOH Interventions Today    Flowsheet Row Most Recent Value  SDOH Interventions   Housing Interventions Intervention Not Indicated  Transportation Interventions Intervention Not Indicated        Care Coordination Interventions:  Yes, provided   Follow up plan: Follow up call scheduled for December    Encounter Outcome:  Patient Visit Completed   Bary Leriche, RN, MSN [place]  [facility], [facility] Management Community Coordinator Direct Dial: [phone]  Fax: [phone] Website: [email or web address]

## 2023-09-08 ENCOUNTER — Encounter: Payer: Self-pay | Admitting: Neurology

## 2023-09-08 ENCOUNTER — Ambulatory Visit (INDEPENDENT_AMBULATORY_CARE_PROVIDER_SITE_OTHER): Payer: Medicare Other | Admitting: Neurology

## 2023-09-08 VITALS — BP 102/65 | HR 86 | Ht 68.0 in | Wt 121.6 lb

## 2023-09-08 DIAGNOSIS — I4819 Other persistent atrial fibrillation: Secondary | ICD-10-CM

## 2023-09-08 DIAGNOSIS — G253 Myoclonus: Secondary | ICD-10-CM

## 2023-09-08 DIAGNOSIS — N185 Chronic kidney disease, stage 5: Secondary | ICD-10-CM | POA: Diagnosis not present

## 2023-09-08 DIAGNOSIS — R270 Ataxia, unspecified: Secondary | ICD-10-CM

## 2023-09-08 NOTE — Patient Instructions (Signed)
A referral to [facility] has been placed for your MRI someone will contact you directly to schedule your appt. They are located at [place]. Please contact them directly by calling [phone] with any questions regarding your referral.  The physicians and staff at [facility] are committed to providing excellent care. You may receive a survey requesting feedback about your experience at our office. We strive to receive "very good" responses to the survey questions. If you feel that your experience would prevent you from giving the office a "very good " response, please contact our office to try to remedy the situation. We may be reached at [phone]. Thank you for taking the time out of your busy day to complete the survey.

## 2023-09-12 DIAGNOSIS — I132 Hypertensive heart and chronic kidney disease with heart failure and with stage 5 chronic kidney disease, or end stage renal disease: Secondary | ICD-10-CM | POA: Diagnosis not present

## 2023-09-12 DIAGNOSIS — I4892 Unspecified atrial flutter: Secondary | ICD-10-CM | POA: Diagnosis not present

## 2023-09-12 DIAGNOSIS — R7303 Prediabetes: Secondary | ICD-10-CM | POA: Diagnosis not present

## 2023-09-12 DIAGNOSIS — E785 Hyperlipidemia, unspecified: Secondary | ICD-10-CM | POA: Diagnosis not present

## 2023-09-12 DIAGNOSIS — K5790 Diverticulosis of intestine, part unspecified, without perforation or abscess without bleeding: Secondary | ICD-10-CM | POA: Diagnosis not present

## 2023-09-12 DIAGNOSIS — I272 Pulmonary hypertension, unspecified: Secondary | ICD-10-CM | POA: Diagnosis not present

## 2023-09-12 DIAGNOSIS — I082 Rheumatic disorders of both aortic and tricuspid valves: Secondary | ICD-10-CM | POA: Diagnosis not present

## 2023-09-12 DIAGNOSIS — I447 Left bundle-branch block, unspecified: Secondary | ICD-10-CM | POA: Diagnosis not present

## 2023-09-12 DIAGNOSIS — Z9089 Acquired absence of other organs: Secondary | ICD-10-CM | POA: Diagnosis not present

## 2023-09-12 DIAGNOSIS — Z8701 Personal history of pneumonia (recurrent): Secondary | ICD-10-CM | POA: Diagnosis not present

## 2023-09-12 DIAGNOSIS — I4819 Other persistent atrial fibrillation: Secondary | ICD-10-CM | POA: Diagnosis not present

## 2023-09-12 DIAGNOSIS — Z7901 Long term (current) use of anticoagulants: Secondary | ICD-10-CM | POA: Diagnosis not present

## 2023-09-12 DIAGNOSIS — N186 End stage renal disease: Secondary | ICD-10-CM | POA: Diagnosis not present

## 2023-09-12 DIAGNOSIS — D631 Anemia in chronic kidney disease: Secondary | ICD-10-CM | POA: Diagnosis not present

## 2023-09-12 DIAGNOSIS — N400 Enlarged prostate without lower urinary tract symptoms: Secondary | ICD-10-CM | POA: Diagnosis not present

## 2023-09-12 DIAGNOSIS — E039 Hypothyroidism, unspecified: Secondary | ICD-10-CM | POA: Diagnosis not present

## 2023-09-12 DIAGNOSIS — I2510 Atherosclerotic heart disease of native coronary artery without angina pectoris: Secondary | ICD-10-CM | POA: Diagnosis not present

## 2023-09-12 DIAGNOSIS — I5042 Chronic combined systolic (congestive) and diastolic (congestive) heart failure: Secondary | ICD-10-CM | POA: Diagnosis not present

## 2023-09-14 ENCOUNTER — Telehealth: Payer: Self-pay | Admitting: Family Medicine

## 2023-09-14 NOTE — Telephone Encounter (Signed)
[place] PT [facility]  need VO PT 2x3, 1x1 starting this week

## 2023-09-15 NOTE — Telephone Encounter (Signed)
VO given to [facility].

## 2023-09-15 NOTE — Telephone Encounter (Signed)
It is okay to give verbal lateralization to requested services. Thanks, BJ

## 2023-09-15 NOTE — Progress Notes (Unsigned)
Cardiology Office Note    Patient Name: Douglas Matthews Date of Encounter: 09/15/2023  Primary Care Provider:  [place], [name], MD Primary Cardiologist:  Kristeen Miss, MD Primary Electrophysiologist: None   Past Medical History    Past Medical History:  Diagnosis Date   Anemia    Atrial fibrillation (HCC)    Atrial flutter (HCC)    BPH (benign prostatic hypertrophy)    Chronic HFrEF (heart failure with reduced ejection fraction) (HCC)    Chronic kidney disease (CKD), active medical management without dialysis, stage 5 (HCC)    Hyperlipidemia    Hypertension    Hypothyroidism    LBBB (left bundle branch block)     History of Present Illness  Douglas Matthews is a 88 year old male with a PMH of AF (on Eliquis), HTN, HLD, HFrEF, BPH, LBBB, HFpEF, anemia who presents today for follow-up.   Mr. Grieger was last seen on 07/17/2023 for follow-up visit.  During visit patient reported improvement to his appetite and resolution to his itching.  He was recently started on hydralazine due to elevated BP and blood pressures were stable with elevated heart rate.  He was noted to have 2/3+ pitting edema secondary to renal disease.  He was scheduled to meet with palliative care and follow-up with his pulmonologist at that time.  During visit amiodarone was increased to 200 mg twice daily.  He contacted our office 1 month later on 08/14/2023 with complaint of intermittent trembling in his legs.  The pharmacy team was consulted and reported possible side effect of amiodarone was involuntary trembling and movement.  I consulted his primary cardiologist Dr. Elease Matthews and amiodarone was decreased to 20 mg once daily.  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  He was also scheduled to undergo an MRI of the brain for further evaluation.  Due to his Eliquis he was not a candidate for  tremor medication such as primidone and due to fall risk was not started on clonazepam.  During today's visit the patient reports*** .  Patient denies chest pain, palpitations, dyspnea, PND, orthopnea, nausea, vomiting, dizziness, syncope, edema, weight gain, or early satiety.  ***Notes: -Last ischemic evaluation: -Last echo: -Interim ED visits: Review of Systems  Please see the history of present illness.    All other systems reviewed and are otherwise negative except as noted above.  Physical Exam    Wt Readings from Last 3 Encounters:  09/08/23 121 lb 9.6 oz (55.2 kg)  08/07/23 141 lb 0.1 oz (64 kg)  07/17/23 141 lb 3.2 oz (64 kg)   XB:JYNWG were no vitals filed for this visit.,There is no height or weight on file to calculate BMI. GEN: Well nourished, well developed in no acute distress Neck: No JVD; No carotid bruits Pulmonary: Clear to auscultation without rales, wheezing or rhonchi  Cardiovascular: Normal rate. Regular rhythm. Normal S1. Normal S2.   Murmurs: There is no murmur.  ABDOMEN: Soft, non-tender, non-distended EXTREMITIES:  No edema; No deformity   EKG/LABS/ Recent Cardiac Studies   ECG personally reviewed by me today - ***  Risk Assessment/Calculations:   {Does this patient have ATRIAL FIBRILLATION?:[phone]}      Lab Results  Component Value Date   WBC 4.5 06/18/2023   HGB 10.5 (L) 06/18/2023   HCT 32.9 (L) 06/18/2023   MCV 93.5 06/18/2023   PLT 153 06/18/2023  Lab Results  Component Value Date   CREATININE 6.92 (HH) 08/07/2023   BUN 121 (HH) 08/07/2023   NA 141 08/07/2023   K 4.6 08/07/2023   CL 102 08/07/2023   CO2 26 08/07/2023   Lab Results  Component Value Date   CHOL 259 (H) 05/08/2021   HDL 59.40 05/08/2021   LDLCALC 182 (H) 05/08/2021   LDLDIRECT 220.5 12/15/2011   TRIG 87.0 05/08/2021   CHOLHDL 4 05/08/2021    Lab Results  Component Value Date   HGBA1C 6.2 04/06/2023   Assessment & Plan    1. Atrial fibrillation:   -Patient underwent DCCV with conversion to sinus rhythm and today is  2.HFrEF:   3..Essential hypertension: -Patient's blood pressure today was  4.  CKD stage V:      Disposition: Follow-up with Kristeen Miss, MD or APP in *** months {Are you ordering a CV Procedure (e.g. stress test, cath, DCCV, TEE, etc)?   Press F2        :[id number]}   Signed, Napoleon Form, Leodis Rains, NP 09/15/2023, 2:51 PM [facility]

## 2023-09-16 ENCOUNTER — Encounter: Payer: Self-pay | Admitting: Nurse Practitioner

## 2023-09-16 ENCOUNTER — Ambulatory Visit: Payer: Medicare Other | Attending: Nurse Practitioner | Admitting: Nurse Practitioner

## 2023-09-16 VITALS — BP 108/70 | HR 85 | Ht 68.0 in | Wt 124.8 lb

## 2023-09-16 DIAGNOSIS — I5020 Unspecified systolic (congestive) heart failure: Secondary | ICD-10-CM | POA: Diagnosis not present

## 2023-09-16 DIAGNOSIS — R2681 Unsteadiness on feet: Secondary | ICD-10-CM | POA: Insufficient documentation

## 2023-09-16 DIAGNOSIS — R2689 Other abnormalities of gait and mobility: Secondary | ICD-10-CM | POA: Diagnosis not present

## 2023-09-16 DIAGNOSIS — I482 Chronic atrial fibrillation, unspecified: Secondary | ICD-10-CM | POA: Diagnosis not present

## 2023-09-16 DIAGNOSIS — N185 Chronic kidney disease, stage 5: Secondary | ICD-10-CM | POA: Insufficient documentation

## 2023-09-16 DIAGNOSIS — I10 Essential (primary) hypertension: Secondary | ICD-10-CM | POA: Insufficient documentation

## 2023-09-16 NOTE — Patient Instructions (Signed)
Medication Instructions:  Your physician recommends that you continue on your current medications as directed. Please refer to the Current Medication list given to you today. *If you need a refill on your cardiac medications before your next appointment, please call your pharmacy*   Lab Work: None ordered If you have labs (blood work) drawn today and your tests are completely normal, you will receive your results only by: MyChart Message (if you have MyChart) OR A paper copy in the mail If you have any lab test that is abnormal or we need to change your treatment, we will call you to review the results.   Testing/Procedures: None ordered   Follow-Up: At [facility], you and your health needs are our priority.  As part of our continuing mission to provide you with exceptional heart care, we have created designated Provider Care Teams.  These Care Teams include your primary Cardiologist (physician) and Advanced Practice Providers (APPs -  Physician Assistants and Nurse Practitioners) who all work together to provide you with the care you need, when you need it.  We recommend signing up for the patient portal called "MyChart".  Sign up information is provided on this After Visit Summary.  MyChart is used to connect with patients for Virtual Visits (Telemedicine).  Patients are able to view lab/test results, encounter notes, upcoming appointments, etc.  Non-urgent messages can be sent to your provider as well.   To learn more about what you can do with MyChart, go to [email or web address].    Your next appointment:   6 month(s)  Provider:   Kristeen Miss, MD    Other Instructions

## 2023-09-18 DIAGNOSIS — N186 End stage renal disease: Secondary | ICD-10-CM | POA: Diagnosis not present

## 2023-09-18 DIAGNOSIS — I132 Hypertensive heart and chronic kidney disease with heart failure and with stage 5 chronic kidney disease, or end stage renal disease: Secondary | ICD-10-CM | POA: Diagnosis not present

## 2023-09-18 DIAGNOSIS — I5042 Chronic combined systolic (congestive) and diastolic (congestive) heart failure: Secondary | ICD-10-CM | POA: Diagnosis not present

## 2023-09-18 DIAGNOSIS — I2510 Atherosclerotic heart disease of native coronary artery without angina pectoris: Secondary | ICD-10-CM | POA: Diagnosis not present

## 2023-09-18 DIAGNOSIS — D631 Anemia in chronic kidney disease: Secondary | ICD-10-CM | POA: Diagnosis not present

## 2023-09-18 DIAGNOSIS — I272 Pulmonary hypertension, unspecified: Secondary | ICD-10-CM | POA: Diagnosis not present

## 2023-09-21 DIAGNOSIS — I2510 Atherosclerotic heart disease of native coronary artery without angina pectoris: Secondary | ICD-10-CM | POA: Diagnosis not present

## 2023-09-21 DIAGNOSIS — I272 Pulmonary hypertension, unspecified: Secondary | ICD-10-CM | POA: Diagnosis not present

## 2023-09-21 DIAGNOSIS — I5042 Chronic combined systolic (congestive) and diastolic (congestive) heart failure: Secondary | ICD-10-CM | POA: Diagnosis not present

## 2023-09-21 DIAGNOSIS — N186 End stage renal disease: Secondary | ICD-10-CM | POA: Diagnosis not present

## 2023-09-21 DIAGNOSIS — I132 Hypertensive heart and chronic kidney disease with heart failure and with stage 5 chronic kidney disease, or end stage renal disease: Secondary | ICD-10-CM | POA: Diagnosis not present

## 2023-09-21 DIAGNOSIS — D631 Anemia in chronic kidney disease: Secondary | ICD-10-CM | POA: Diagnosis not present

## 2023-09-23 DIAGNOSIS — D631 Anemia in chronic kidney disease: Secondary | ICD-10-CM | POA: Diagnosis not present

## 2023-09-23 DIAGNOSIS — N186 End stage renal disease: Secondary | ICD-10-CM | POA: Diagnosis not present

## 2023-09-23 DIAGNOSIS — I272 Pulmonary hypertension, unspecified: Secondary | ICD-10-CM | POA: Diagnosis not present

## 2023-09-23 DIAGNOSIS — I5042 Chronic combined systolic (congestive) and diastolic (congestive) heart failure: Secondary | ICD-10-CM | POA: Diagnosis not present

## 2023-09-23 DIAGNOSIS — I132 Hypertensive heart and chronic kidney disease with heart failure and with stage 5 chronic kidney disease, or end stage renal disease: Secondary | ICD-10-CM | POA: Diagnosis not present

## 2023-09-23 DIAGNOSIS — I2510 Atherosclerotic heart disease of native coronary artery without angina pectoris: Secondary | ICD-10-CM | POA: Diagnosis not present

## 2023-09-28 DIAGNOSIS — I272 Pulmonary hypertension, unspecified: Secondary | ICD-10-CM | POA: Diagnosis not present

## 2023-09-28 DIAGNOSIS — I132 Hypertensive heart and chronic kidney disease with heart failure and with stage 5 chronic kidney disease, or end stage renal disease: Secondary | ICD-10-CM | POA: Diagnosis not present

## 2023-09-28 DIAGNOSIS — N186 End stage renal disease: Secondary | ICD-10-CM | POA: Diagnosis not present

## 2023-09-28 DIAGNOSIS — D631 Anemia in chronic kidney disease: Secondary | ICD-10-CM | POA: Diagnosis not present

## 2023-09-28 DIAGNOSIS — I2510 Atherosclerotic heart disease of native coronary artery without angina pectoris: Secondary | ICD-10-CM | POA: Diagnosis not present

## 2023-09-28 DIAGNOSIS — I5042 Chronic combined systolic (congestive) and diastolic (congestive) heart failure: Secondary | ICD-10-CM | POA: Diagnosis not present

## 2023-09-30 DIAGNOSIS — N2581 Secondary hyperparathyroidism of renal origin: Secondary | ICD-10-CM | POA: Diagnosis not present

## 2023-09-30 DIAGNOSIS — D638 Anemia in other chronic diseases classified elsewhere: Secondary | ICD-10-CM | POA: Diagnosis not present

## 2023-09-30 DIAGNOSIS — I5082 Biventricular heart failure: Secondary | ICD-10-CM | POA: Diagnosis not present

## 2023-09-30 DIAGNOSIS — I120 Hypertensive chronic kidney disease with stage 5 chronic kidney disease or end stage renal disease: Secondary | ICD-10-CM | POA: Diagnosis not present

## 2023-09-30 DIAGNOSIS — N185 Chronic kidney disease, stage 5: Secondary | ICD-10-CM | POA: Diagnosis not present

## 2023-09-30 LAB — BASIC METABOLIC PANEL
BUN: 155 — AB (ref 4–21)
CO2: 18 (ref 13–22)
Chloride: 106 (ref 99–108)
Creatinine: 8 — AB (ref 0.6–1.3)
Glucose: 99
Potassium: 5 meq/L (ref 3.5–5.1)
Sodium: 139 (ref 137–147)

## 2023-09-30 LAB — COMPREHENSIVE METABOLIC PANEL
Albumin: 3.6 (ref 3.5–5.0)
Calcium: 8.4 — AB (ref 8.7–10.7)
eGFR: 6

## 2023-09-30 LAB — LAB REPORT - SCANNED: EGFR: 6

## 2023-09-30 LAB — CBC AND DIFFERENTIAL: Hemoglobin: 7.4 — AB (ref 13.5–17.5)

## 2023-10-02 ENCOUNTER — Ambulatory Visit
Admission: RE | Admit: 2023-10-02 | Discharge: 2023-10-02 | Disposition: A | Discharge status: Home / Self Care | Payer: Medicare Other | Source: Ambulatory Visit | Attending: Neurology | Admitting: Neurology

## 2023-10-02 DIAGNOSIS — I132 Hypertensive heart and chronic kidney disease with heart failure and with stage 5 chronic kidney disease, or end stage renal disease: Secondary | ICD-10-CM | POA: Diagnosis not present

## 2023-10-02 DIAGNOSIS — R970 Elevated carcinoembryonic antigen [CEA]: Secondary | ICD-10-CM | POA: Diagnosis not present

## 2023-10-02 DIAGNOSIS — N186 End stage renal disease: Secondary | ICD-10-CM | POA: Diagnosis not present

## 2023-10-02 DIAGNOSIS — I6782 Cerebral ischemia: Secondary | ICD-10-CM | POA: Diagnosis not present

## 2023-10-02 DIAGNOSIS — I272 Pulmonary hypertension, unspecified: Secondary | ICD-10-CM | POA: Diagnosis not present

## 2023-10-02 DIAGNOSIS — D631 Anemia in chronic kidney disease: Secondary | ICD-10-CM | POA: Diagnosis not present

## 2023-10-02 DIAGNOSIS — S0990XA Unspecified injury of head, initial encounter: Secondary | ICD-10-CM | POA: Diagnosis not present

## 2023-10-02 DIAGNOSIS — R9089 Other abnormal findings on diagnostic imaging of central nervous system: Secondary | ICD-10-CM | POA: Diagnosis not present

## 2023-10-02 DIAGNOSIS — R270 Ataxia, unspecified: Secondary | ICD-10-CM

## 2023-10-02 DIAGNOSIS — I2510 Atherosclerotic heart disease of native coronary artery without angina pectoris: Secondary | ICD-10-CM | POA: Diagnosis not present

## 2023-10-02 DIAGNOSIS — I5042 Chronic combined systolic (congestive) and diastolic (congestive) heart failure: Secondary | ICD-10-CM | POA: Diagnosis not present

## 2023-10-06 DIAGNOSIS — I272 Pulmonary hypertension, unspecified: Secondary | ICD-10-CM | POA: Diagnosis not present

## 2023-10-06 DIAGNOSIS — I5042 Chronic combined systolic (congestive) and diastolic (congestive) heart failure: Secondary | ICD-10-CM | POA: Diagnosis not present

## 2023-10-06 DIAGNOSIS — I132 Hypertensive heart and chronic kidney disease with heart failure and with stage 5 chronic kidney disease, or end stage renal disease: Secondary | ICD-10-CM | POA: Diagnosis not present

## 2023-10-06 DIAGNOSIS — N186 End stage renal disease: Secondary | ICD-10-CM | POA: Diagnosis not present

## 2023-10-06 DIAGNOSIS — I2510 Atherosclerotic heart disease of native coronary artery without angina pectoris: Secondary | ICD-10-CM | POA: Diagnosis not present

## 2023-10-06 DIAGNOSIS — D631 Anemia in chronic kidney disease: Secondary | ICD-10-CM | POA: Diagnosis not present

## 2023-10-07 ENCOUNTER — Encounter: Payer: Self-pay | Admitting: Family Medicine

## 2023-10-07 ENCOUNTER — Telehealth: Payer: Self-pay

## 2023-10-07 DIAGNOSIS — E039 Hypothyroidism, unspecified: Secondary | ICD-10-CM

## 2023-10-07 NOTE — Telephone Encounter (Signed)
-----   Message from [facility] [name] sent at 08/07/2023  5:03 PM EDT ----- TSH 8 weeks

## 2023-10-07 NOTE — Telephone Encounter (Signed)
Lab appt scheduled for 11/8 at 2pm.

## 2023-10-09 ENCOUNTER — Other Ambulatory Visit (INDEPENDENT_AMBULATORY_CARE_PROVIDER_SITE_OTHER): Payer: Medicare Other

## 2023-10-09 DIAGNOSIS — E039 Hypothyroidism, unspecified: Secondary | ICD-10-CM | POA: Diagnosis not present

## 2023-10-09 LAB — TSH: TSH: 52.26 u[IU]/mL — ABNORMAL HIGH (ref 0.35–5.50)

## 2023-10-12 ENCOUNTER — Other Ambulatory Visit: Payer: Self-pay

## 2023-10-12 ENCOUNTER — Ambulatory Visit (HOSPITAL_COMMUNITY)
Admission: RE | Admit: 2023-10-12 | Discharge: 2023-10-12 | Disposition: A | Discharge status: Home / Self Care | Payer: Medicare Other | Source: Ambulatory Visit | Attending: Nephrology | Admitting: Nephrology

## 2023-10-12 VITALS — BP 106/67 | HR 88 | Temp 97.2°F | Resp 17

## 2023-10-12 DIAGNOSIS — D631 Anemia in chronic kidney disease: Secondary | ICD-10-CM | POA: Insufficient documentation

## 2023-10-12 DIAGNOSIS — N400 Enlarged prostate without lower urinary tract symptoms: Secondary | ICD-10-CM | POA: Diagnosis not present

## 2023-10-12 DIAGNOSIS — I272 Pulmonary hypertension, unspecified: Secondary | ICD-10-CM | POA: Diagnosis not present

## 2023-10-12 DIAGNOSIS — R7303 Prediabetes: Secondary | ICD-10-CM | POA: Diagnosis not present

## 2023-10-12 DIAGNOSIS — I082 Rheumatic disorders of both aortic and tricuspid valves: Secondary | ICD-10-CM | POA: Diagnosis not present

## 2023-10-12 DIAGNOSIS — I132 Hypertensive heart and chronic kidney disease with heart failure and with stage 5 chronic kidney disease, or end stage renal disease: Secondary | ICD-10-CM | POA: Diagnosis not present

## 2023-10-12 DIAGNOSIS — I4819 Other persistent atrial fibrillation: Secondary | ICD-10-CM | POA: Diagnosis not present

## 2023-10-12 DIAGNOSIS — I447 Left bundle-branch block, unspecified: Secondary | ICD-10-CM | POA: Diagnosis not present

## 2023-10-12 DIAGNOSIS — I5042 Chronic combined systolic (congestive) and diastolic (congestive) heart failure: Secondary | ICD-10-CM | POA: Diagnosis not present

## 2023-10-12 DIAGNOSIS — N185 Chronic kidney disease, stage 5: Secondary | ICD-10-CM | POA: Insufficient documentation

## 2023-10-12 DIAGNOSIS — E785 Hyperlipidemia, unspecified: Secondary | ICD-10-CM | POA: Diagnosis not present

## 2023-10-12 DIAGNOSIS — I2510 Atherosclerotic heart disease of native coronary artery without angina pectoris: Secondary | ICD-10-CM | POA: Diagnosis not present

## 2023-10-12 DIAGNOSIS — N186 End stage renal disease: Secondary | ICD-10-CM | POA: Diagnosis not present

## 2023-10-12 DIAGNOSIS — Z9089 Acquired absence of other organs: Secondary | ICD-10-CM | POA: Diagnosis not present

## 2023-10-12 DIAGNOSIS — E039 Hypothyroidism, unspecified: Secondary | ICD-10-CM | POA: Diagnosis not present

## 2023-10-12 DIAGNOSIS — Z7901 Long term (current) use of anticoagulants: Secondary | ICD-10-CM | POA: Diagnosis not present

## 2023-10-12 DIAGNOSIS — Z8701 Personal history of pneumonia (recurrent): Secondary | ICD-10-CM | POA: Diagnosis not present

## 2023-10-12 DIAGNOSIS — I4892 Unspecified atrial flutter: Secondary | ICD-10-CM | POA: Diagnosis not present

## 2023-10-12 DIAGNOSIS — K5790 Diverticulosis of intestine, part unspecified, without perforation or abscess without bleeding: Secondary | ICD-10-CM | POA: Diagnosis not present

## 2023-10-12 LAB — POCT HEMOGLOBIN-HEMACUE: Hemoglobin: 7.8 g/dL — ABNORMAL LOW (ref 13.0–17.0)

## 2023-10-12 MED ORDER — LEVOTHYROXINE SODIUM 88 MCG PO TABS: 88.0000 ug | ORAL_TABLET | Freq: Every day | ORAL | 3 refills | Status: DC

## 2023-10-12 MED ORDER — DARBEPOETIN ALFA 60 MCG/0.3ML IJ SOSY
60.0000 ug | PREFILLED_SYRINGE | INTRAMUSCULAR | Status: DC
  Administered 2023-10-12: 60 ug via SUBCUTANEOUS

## 2023-10-12 MED ORDER — DARBEPOETIN ALFA 60 MCG/0.3ML IJ SOSY
PREFILLED_SYRINGE | INTRAMUSCULAR | Status: AC
  Filled 2023-10-12: qty 0.3

## 2023-10-15 ENCOUNTER — Telehealth: Payer: Self-pay | Admitting: Family Medicine

## 2023-10-15 NOTE — Telephone Encounter (Addendum)
Heather with [facility]  [phone] [place] to leave a detailed message on this line  Verbal Order - Physical Therapy:  Request to continue PT for 2 additional visits, starting Monday (10/19/23)  Herbert Seta was informed that MD is OOO on Thursdays.

## 2023-10-16 NOTE — Telephone Encounter (Signed)
I left Heather a voicemail with the verbal order approval.

## 2023-10-20 DIAGNOSIS — I5042 Chronic combined systolic (congestive) and diastolic (congestive) heart failure: Secondary | ICD-10-CM | POA: Diagnosis not present

## 2023-10-20 DIAGNOSIS — N186 End stage renal disease: Secondary | ICD-10-CM | POA: Diagnosis not present

## 2023-10-20 DIAGNOSIS — I132 Hypertensive heart and chronic kidney disease with heart failure and with stage 5 chronic kidney disease, or end stage renal disease: Secondary | ICD-10-CM | POA: Diagnosis not present

## 2023-10-20 DIAGNOSIS — I2510 Atherosclerotic heart disease of native coronary artery without angina pectoris: Secondary | ICD-10-CM | POA: Diagnosis not present

## 2023-10-20 DIAGNOSIS — I272 Pulmonary hypertension, unspecified: Secondary | ICD-10-CM | POA: Diagnosis not present

## 2023-10-20 DIAGNOSIS — D631 Anemia in chronic kidney disease: Secondary | ICD-10-CM | POA: Diagnosis not present

## 2023-10-27 DIAGNOSIS — I272 Pulmonary hypertension, unspecified: Secondary | ICD-10-CM | POA: Diagnosis not present

## 2023-10-27 DIAGNOSIS — I5042 Chronic combined systolic (congestive) and diastolic (congestive) heart failure: Secondary | ICD-10-CM | POA: Diagnosis not present

## 2023-10-27 DIAGNOSIS — D631 Anemia in chronic kidney disease: Secondary | ICD-10-CM | POA: Diagnosis not present

## 2023-10-27 DIAGNOSIS — I132 Hypertensive heart and chronic kidney disease with heart failure and with stage 5 chronic kidney disease, or end stage renal disease: Secondary | ICD-10-CM | POA: Diagnosis not present

## 2023-10-27 DIAGNOSIS — N186 End stage renal disease: Secondary | ICD-10-CM | POA: Diagnosis not present

## 2023-10-27 DIAGNOSIS — I2510 Atherosclerotic heart disease of native coronary artery without angina pectoris: Secondary | ICD-10-CM | POA: Diagnosis not present

## 2023-11-09 ENCOUNTER — Telehealth: Payer: Self-pay

## 2023-11-09 ENCOUNTER — Ambulatory Visit (HOSPITAL_COMMUNITY)
Admission: RE | Admit: 2023-11-09 | Discharge: 2023-11-09 | Disposition: A | Discharge status: Home / Self Care | Payer: Medicare Other | Source: Ambulatory Visit | Attending: Nephrology | Admitting: Nephrology

## 2023-11-09 ENCOUNTER — Ambulatory Visit: Payer: Self-pay

## 2023-11-09 ENCOUNTER — Telehealth: Payer: Self-pay | Admitting: Family Medicine

## 2023-11-09 VITALS — BP 106/77 | HR 88 | Temp 97.8°F | Resp 17

## 2023-11-09 DIAGNOSIS — N185 Chronic kidney disease, stage 5: Secondary | ICD-10-CM | POA: Diagnosis not present

## 2023-11-09 LAB — RENAL FUNCTION PANEL
Albumin: 3 g/dL — ABNORMAL LOW (ref 3.5–5.0)
Anion gap: 16 — ABNORMAL HIGH (ref 5–15)
BUN: 166 mg/dL — ABNORMAL HIGH (ref 8–23)
CO2: 19 mmol/L — ABNORMAL LOW (ref 22–32)
Calcium: 8.3 mg/dL — ABNORMAL LOW (ref 8.9–10.3)
Chloride: 108 mmol/L (ref 98–111)
Creatinine, Ser: 8.91 mg/dL — ABNORMAL HIGH (ref 0.61–1.24)
GFR, Estimated: 5 mL/min — ABNORMAL LOW (ref >60)
Glucose, Bld: 91 mg/dL (ref 70–99)
Phosphorus: 7.6 mg/dL — ABNORMAL HIGH (ref 2.5–4.6)
Potassium: 4.3 mmol/L (ref 3.5–5.1)
Sodium: 143 mmol/L (ref 135–145)

## 2023-11-09 LAB — POCT HEMOGLOBIN-HEMACUE: Hemoglobin: 8.1 g/dL — ABNORMAL LOW (ref 13.0–17.0)

## 2023-11-09 MED ORDER — DARBEPOETIN ALFA 60 MCG/0.3ML IJ SOSY
PREFILLED_SYRINGE | INTRAMUSCULAR | Status: AC
  Filled 2023-11-09: qty 0.3

## 2023-11-09 MED ORDER — DARBEPOETIN ALFA 60 MCG/0.3ML IJ SOSY
60.0000 ug | PREFILLED_SYRINGE | INTRAMUSCULAR | Status: DC
  Administered 2023-11-09: 60 ug via SUBCUTANEOUS

## 2023-11-09 NOTE — Telephone Encounter (Signed)
Requesting switch from palliative care to hospice care

## 2023-11-09 NOTE — Patient Outreach (Signed)
  Care Coordination   Follow Up Visit Note   11/09/2023 Name: Douglas Matthews MRN: [id number] DOB: 03-29-1935  [name] is a 88 y.o. year old male who sees [place], Timoteo Expose, MD for primary care. I spoke with  [name] by phone today.  What matters to the patients health and wellness today?  Going to full hospice services    Goals Addressed             This Visit's Progress    COMPLETED: Kidney Disease Management       Patient Goals/Self Care Activities: -Patient/Caregiver will self-administer medications as prescribed as evidenced by self-report/primary caregiver report  -Patient/Caregiver will attend all scheduled provider appointments as evidenced by clinician review of documented attendance to scheduled appointments and patient/caregiver report -Patient/Caregiver will call provider office for new concerns or questions as evidenced by review of documented incoming telephone call notes and patient report  -Weigh daily and record (notify MD with 3 lb weight gain over night or 5 lb in a week) -Adhere to low sodium diet  Weight today 125-130 lbs.  Patient reports doing okay. He shares that he will be going over to hospice care now.  Discussed hospice and benefit and advised that CM would no longer be call.  He verbalized understanding.  No concerns.          SDOH assessments and interventions completed:  Yes     Care Coordination Interventions:  Yes, provided   Follow up plan: No further intervention required.   Encounter Outcome:  Patient Visit Completed   Bary Leriche, RN, MSN RN Care Manager [facility], Population Health Direct Dial: [phone]  Fax: [phone] Website: [email or web address]

## 2023-11-09 NOTE — Telephone Encounter (Signed)
I spoke with PCP, okay for transition. I called and spoke with [facility]. They are aware that PCP is okay with transition and will be the attending, but defers any pain management/symptomatic treatment to the hospice provider.

## 2023-11-09 NOTE — Patient Outreach (Signed)
  Care Coordination   11/09/2023 Name: Douglas Matthews MRN: [id number] DOB: 1935-03-03   Care Coordination Outreach Attempts:  An unsuccessful telephone outreach was attempted today to offer the patient information about available care coordination services.  Patient reports he is out for breakfast and request a call back.    Follow Up Plan:  Additional outreach attempts will be made to offer the patient care coordination information and services.   Encounter Outcome:  Patient Request to Call Back   Care Coordination Interventions:  No, not indicated    Natividad Schlosser Idelle Jo, RN, MSN RN Care Manager [facility], Population Health Direct Dial: [phone]  Fax: [phone] Website: [email or web address]

## 2023-11-09 NOTE — Patient Instructions (Signed)
Visit Information  Thank you for taking time to visit with me today. Please don't hesitate to contact me if I can be of assistance to you.   Following are the goals we discussed today:   Goals Addressed             This Visit's Progress    COMPLETED: Kidney Disease Management       Patient Goals/Self Care Activities: -Patient/Caregiver will self-administer medications as prescribed as evidenced by self-report/primary caregiver report  -Patient/Caregiver will attend all scheduled provider appointments as evidenced by clinician review of documented attendance to scheduled appointments and patient/caregiver report -Patient/Caregiver will call provider office for new concerns or questions as evidenced by review of documented incoming telephone call notes and patient report  -Weigh daily and record (notify MD with 3 lb weight gain over night or 5 lb in a week) -Adhere to low sodium diet  Weight today 125-130 lbs.  Patient reports doing okay. He shares that he will be going over to hospice care now.  Discussed hospice and benefit and advised that CM would no longer be call.  He verbalized understanding.  No concerns.             If you are experiencing a Mental Health or Behavioral Health Crisis or need someone to talk to, please call the Suicide and Crisis Lifeline: 988   Patient verbalizes understanding of instructions and care plan provided today and agrees to view in MyChart. Active MyChart status and patient understanding of how to access instructions and care plan via MyChart confirmed with patient.     No further follow up required: Patient hospice now  Bary Leriche, RN, MSN RN Care Manager [facility], Population Health Direct Dial: [phone]  Fax: [phone] Website: [email or web address]

## 2023-11-18 ENCOUNTER — Other Ambulatory Visit: Payer: Self-pay | Admitting: Family Medicine

## 2023-11-18 ENCOUNTER — Other Ambulatory Visit: Payer: Self-pay | Admitting: Cardiovascular Disease

## 2023-11-18 DIAGNOSIS — I5020 Unspecified systolic (congestive) heart failure: Secondary | ICD-10-CM

## 2023-11-19 NOTE — Telephone Encounter (Signed)
Prescription refill request for Eliquis received. Indication: AF Last office visit: 09/16/23  Inda Coke NP Scr: 8.91 on 11/09/23  Epic Age: 88 Weight: 56.6kg  Based on above findings Eliquis 2.5mg  twice daily is the appropriate dose.  Refill approved.

## 2023-12-03 ENCOUNTER — Telehealth: Payer: Self-pay | Admitting: Cardiovascular Disease

## 2023-12-03 NOTE — Telephone Encounter (Signed)
 Pt c/o swelling/edema: STAT if pt has developed SOB within 24 hours  If swelling, where is the swelling located? Legs and feet   How much weight have you gained and in what time span? 5 to 6 lbs in a couple of days   Have you gained 2 pounds in a day or 5 pounds in a week? Yes  Do you have a log of your daily weights (if so, list)? No   Are you currently taking a fluid pill? Yes  Are you currently SOB? No, but does when moving around from. States they were told the SOB is due to his afib.    Have you traveled recently in a car or plane for an extended period of time? Yes, Thursday 12/15.   Patient's spouse is wanting patient to be seen ASAP by Dr. Alveta. States they are unable to do 01/07 due to an appt conflict, but they do not want him to wait til February to see the MD and they do not wish to schedule it with a PA/NP.   Spouse reports swelling has been ongoing, but has been more excessive recently.   Please advise.

## 2023-12-04 ENCOUNTER — Ambulatory Visit
Admission: EM | Admit: 2023-12-04 | Discharge: 2023-12-04 | Disposition: A | Discharge status: Home / Self Care | Payer: Medicare Other

## 2023-12-04 ENCOUNTER — Emergency Department (HOSPITAL_COMMUNITY): Payer: Medicare Other

## 2023-12-04 ENCOUNTER — Emergency Department (HOSPITAL_COMMUNITY)
Admission: EM | Admit: 2023-12-04 | Discharge: 2023-12-05 | Disposition: A | Discharge status: Home / Self Care | Payer: Medicare Other | Attending: Emergency Medicine | Admitting: Emergency Medicine

## 2023-12-04 ENCOUNTER — Encounter: Payer: Self-pay | Admitting: Emergency Medicine

## 2023-12-04 ENCOUNTER — Other Ambulatory Visit: Payer: Self-pay

## 2023-12-04 ENCOUNTER — Encounter (HOSPITAL_COMMUNITY): Payer: Self-pay

## 2023-12-04 ENCOUNTER — Ambulatory Visit: Payer: Self-pay | Admitting: Family Medicine

## 2023-12-04 DIAGNOSIS — I5020 Unspecified systolic (congestive) heart failure: Secondary | ICD-10-CM

## 2023-12-04 DIAGNOSIS — S80212A Abrasion, left knee, initial encounter: Secondary | ICD-10-CM | POA: Diagnosis not present

## 2023-12-04 DIAGNOSIS — N185 Chronic kidney disease, stage 5: Secondary | ICD-10-CM | POA: Insufficient documentation

## 2023-12-04 DIAGNOSIS — I10 Essential (primary) hypertension: Secondary | ICD-10-CM | POA: Diagnosis not present

## 2023-12-04 DIAGNOSIS — R600 Localized edema: Secondary | ICD-10-CM | POA: Insufficient documentation

## 2023-12-04 DIAGNOSIS — Z79899 Other long term (current) drug therapy: Secondary | ICD-10-CM | POA: Insufficient documentation

## 2023-12-04 DIAGNOSIS — I509 Heart failure, unspecified: Secondary | ICD-10-CM | POA: Diagnosis not present

## 2023-12-04 DIAGNOSIS — J929 Pleural plaque without asbestos: Secondary | ICD-10-CM | POA: Diagnosis not present

## 2023-12-04 DIAGNOSIS — I132 Hypertensive heart and chronic kidney disease with heart failure and with stage 5 chronic kidney disease, or end stage renal disease: Secondary | ICD-10-CM | POA: Diagnosis not present

## 2023-12-04 DIAGNOSIS — S80211A Abrasion, right knee, initial encounter: Secondary | ICD-10-CM | POA: Diagnosis not present

## 2023-12-04 DIAGNOSIS — M1712 Unilateral primary osteoarthritis, left knee: Secondary | ICD-10-CM | POA: Diagnosis not present

## 2023-12-04 DIAGNOSIS — E039 Hypothyroidism, unspecified: Secondary | ICD-10-CM | POA: Diagnosis not present

## 2023-12-04 DIAGNOSIS — Z7901 Long term (current) use of anticoagulants: Secondary | ICD-10-CM | POA: Diagnosis not present

## 2023-12-04 DIAGNOSIS — J40 Bronchitis, not specified as acute or chronic: Secondary | ICD-10-CM | POA: Diagnosis not present

## 2023-12-04 DIAGNOSIS — M25561 Pain in right knee: Secondary | ICD-10-CM | POA: Diagnosis not present

## 2023-12-04 DIAGNOSIS — M7989 Other specified soft tissue disorders: Secondary | ICD-10-CM | POA: Diagnosis not present

## 2023-12-04 LAB — COMPREHENSIVE METABOLIC PANEL
ALT: 34 U/L (ref 0–44)
AST: 34 U/L (ref 15–41)
Albumin: 2.9 g/dL — ABNORMAL LOW (ref 3.5–5.0)
Alkaline Phosphatase: 82 U/L (ref 38–126)
Anion gap: 17 — ABNORMAL HIGH (ref 5–15)
BUN: 167 mg/dL — ABNORMAL HIGH (ref 8–23)
CO2: 15 mmol/L — ABNORMAL LOW (ref 22–32)
Calcium: 7.9 mg/dL — ABNORMAL LOW (ref 8.9–10.3)
Chloride: 109 mmol/L (ref 98–111)
Creatinine, Ser: 8.46 mg/dL — ABNORMAL HIGH (ref 0.61–1.24)
GFR, Estimated: 6 mL/min — ABNORMAL LOW (ref >60)
Glucose, Bld: 91 mg/dL (ref 70–99)
Potassium: 4.5 mmol/L (ref 3.5–5.1)
Sodium: 141 mmol/L (ref 135–145)
Total Bilirubin: 0.6 mg/dL (ref 0.0–1.2)
Total Protein: 7 g/dL (ref 6.5–8.1)

## 2023-12-04 LAB — CBC
HCT: 27.7 % — ABNORMAL LOW (ref 39.0–52.0)
Hemoglobin: 8.8 g/dL — ABNORMAL LOW (ref 13.0–17.0)
MCH: 29.3 pg (ref 26.0–34.0)
MCHC: 31.8 g/dL (ref 30.0–36.0)
MCV: 92.3 fL (ref 80.0–100.0)
Platelets: 175 10*3/uL (ref 150–400)
RBC: 3 MIL/uL — ABNORMAL LOW (ref 4.22–5.81)
RDW: 15.6 % — ABNORMAL HIGH (ref 11.5–15.5)
WBC: 3.8 10*3/uL — ABNORMAL LOW (ref 4.0–10.5)
nRBC: 0 % (ref 0.0–0.2)

## 2023-12-04 LAB — BRAIN NATRIURETIC PEPTIDE: B Natriuretic Peptide: 1043.7 pg/mL — ABNORMAL HIGH (ref 0.0–100.0)

## 2023-12-04 NOTE — ED Provider Notes (Signed)
 [facility]    CSN: [id number] Arrival date & time: 12/04/23  1528      History   Chief Complaint Chief Complaint  Patient presents with   Fall   Rash    HPI Douglas Matthews is a 89 y.o. male.   Patient here today with wife for evaluation of bilateral leg edema, a suspected itchy rash to his legs, and left knee pain that started after a fall about 2 weeks ago.  He reports that he is able to ambulate with his cane as he typically would.  He notes that edema has worsened over the last few weeks.  He does have known heart failure and has appointment scheduled with cardiology later this month.  He has multiple other comorbidities.  He denies any fever.  He reports some shortness of breath with activity but this is not abnormal for him given A-fib.  The history is provided by the patient and the spouse.  Fall Associated symptoms include shortness of breath. Pertinent negatives include no chest pain.  Rash Associated symptoms: shortness of breath   Associated symptoms: no fever     Past Medical History:  Diagnosis Date   Anemia    Atrial fibrillation (HCC)    Atrial flutter (HCC)    BPH (benign prostatic hypertrophy)    Chronic HFrEF (heart failure with reduced ejection fraction) (HCC)    Chronic kidney disease (CKD), active medical management without dialysis, stage 5 (HCC)    Hyperlipidemia    Hypertension    Hypothyroidism    LBBB (left bundle branch block)     Patient Active Problem List   Diagnosis Date Noted   Routine general medical examination at a health care facility 06/16/2023   Hypothyroidism 05/28/2023   CKD (chronic kidney disease), stage V (HCC) 04/06/2023   Prediabetes 04/06/2023   Persistent atrial fibrillation (HCC) 02/04/2023   Chronic anticoagulation 02/04/2023   AKI (acute kidney injury) (HCC) 01/28/2023   Atrial fibrillation, chronic (HCC) 01/28/2023   Heart failure with reduced ejection fraction (HCC) 01/28/2023   Metabolic acidosis  01/28/2023   Nausea 01/28/2023   CAP (community acquired pneumonia) 10/25/2022   Atrial fibrillation with RVR (HCC) 10/24/2022   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   Hypertensive retinopathy of both eyes 06/04/2020   Elevated alkaline phosphatase level 05/18/2020   Diastolic dysfunction 08/18/2016   Acute kidney injury superimposed on chronic kidney disease (HCC) 04/22/2016   Hypertensive kidney disease with chronic kidney disease stage III (HCC) 04/22/2016   HLD (hyperlipidemia) 04/22/2016   Normocytic anemia 07/20/2007   HTN (hypertension) 07/20/2007   Diverticulosis of colon 07/20/2007   BPH associated with nocturia 07/20/2007    Past Surgical History:  Procedure Laterality Date   APPENDECTOMY     CARDIOVERSION N/A 10/29/2022   Procedure: CARDIOVERSION;  Surgeon: Pietro Redell RAMAN, MD;  Location: [facility] ENDOSCOPY;  Service: Cardiovascular;  Laterality: N/A;   CARDIOVERSION N/A 05/21/2023   Procedure: CARDIOVERSION;  Surgeon: Mona Vinie BROCKS, MD;  Location: [facility] INVASIVE CV LAB;  Service: Cardiovascular;  Laterality: N/A;   HERNIA REPAIR     TEE WITHOUT CARDIOVERSION N/A 10/29/2022   Procedure: TRANSESOPHAGEAL ECHOCARDIOGRAM (TEE);  Surgeon: Pietro Redell RAMAN, MD;  Location: [facility] ENDOSCOPY;  Service: Cardiovascular;  Laterality: N/A;       Home Medications    Prior to Admission medications   Medication Sig Start Date End Date Taking? Authorizing Provider  amiodarone  (PACERONE ) 200 MG tablet Take 1 tablet (200 mg total) by mouth  daily. 09/03/23  Yes Wyn Jackee VEAR Mickey., NP  calcitRIOL  (ROCALTROL ) 0.25 MCG capsule Take 0.25 mcg by mouth every other day.   Yes [provider]  COD LIVER OIL PO Take 1 tablet by mouth daily.   Yes [provider]  ELIQUIS  2.5 MG TABS tablet TAKE 1 TABLET BY MOUTH TWICE A DAY 11/19/23  Yes Nahser, Aleene PARAS, MD  Garlic 1000 MG CAPS Take 1,000 mg by mouth daily.   Yes [provider]  levothyroxine  (SYNTHROID ) 88 MCG tablet  TAKE 1 TABLET BY MOUTH EVERY DAY 11/20/23  Yes [name], [name], MD  Multiple Vitamin (MULTIVITAMIN) capsule Take 1 capsule by mouth daily.   Yes [provider]  Torsemide  40 MG TABS Take 1 tablet by mouth daily at 6 (six) AM.   Yes [provider]  levothyroxine  (SYNTHROID ) 75 MCG tablet Take 75 mcg by mouth daily. Patient not taking: Reported on 12/04/2023 11/04/23   [provider]  torsemide  (DEMADEX ) 20 MG tablet Take 1 tablet (20 mg total) by mouth daily. Patient not taking: Reported on 12/04/2023 08/19/23   [name], [name], MD    Family History Family History  Problem Relation Age of Onset   Diabetes Son    Heart disease Other     Social History Social History   Tobacco Use   Smoking status: Never   Smokeless tobacco: Never  Vaping Use   Vaping status: Never Used  Substance Use Topics   Alcohol  use: No   Drug use: No     Allergies   Patient has no known allergies.   Review of Systems Review of Systems  Constitutional:  Negative for chills and fever.  Eyes:  Negative for discharge and redness.  Respiratory:  Positive for shortness of breath.   Cardiovascular:  Positive for leg swelling. Negative for chest pain.  Skin:  Positive for rash.  Neurological:  Negative for numbness.     Physical Exam Triage Vital Signs ED Triage Vitals  Encounter Vitals Group     BP 12/04/23 1545 120/72     Systolic BP Percentile --      Diastolic BP Percentile --      Pulse Rate 12/04/23 1545 78     Resp 12/04/23 1545 18     Temp 12/04/23 1545 (!) 97.5 F (36.4 C)     Temp Source 12/04/23 1545 Oral     SpO2 --      Weight --      Height --      Head Circumference --      Peak Flow --      Pain Score 12/04/23 1551 0     Pain Loc --      Pain Education --      Exclude from Growth Chart --    No data found.  Updated Vital Signs BP 120/72 (BP Location: Left Arm)   Pulse 78   Temp (!) 97.5 F (36.4 C) (Oral)   Resp 18   Visual Acuity Right  Eye Distance:   Left Eye Distance:   Bilateral Distance:    Right Eye Near:   Left Eye Near:    Bilateral Near:     Physical Exam Vitals and nursing note reviewed.  Constitutional:      General: He is not in acute distress.    Appearance: Normal appearance. He is not ill-appearing.  HENT:     Head: Normocephalic and atraumatic.  Eyes:     Conjunctiva/sclera:  Conjunctivae normal.  Cardiovascular:     Rate and Rhythm: Normal rate and regular rhythm.     Heart sounds: Murmur heard.  Pulmonary:     Effort: Pulmonary effort is normal. No respiratory distress.     Breath sounds: Normal breath sounds. No wheezing, rhonchi or rales.  Musculoskeletal:     Right lower leg: Edema present.     Left lower leg: Edema present.     Comments: Right leg edema worse than left  Neurological:     Mental Status: He is alert.      UC Treatments / Results  Labs (all labs ordered are listed, but only abnormal results are displayed) Labs Reviewed - No data to display  EKG   Radiology No results found.  Procedures Procedures (including critical care time)  Medications Ordered in UC Medications - No data to display  Initial Impression / Assessment and Plan / UC Course  I have reviewed the triage vital signs and the nursing notes.  Pertinent labs & imaging results that were available during my care of the patient were reviewed by me and considered in my medical decision making (see chart for details).     Number mentioned in cardiology note from a few months ago.  Given known heart failure, worsening edema, and shortness of breath recommended further evaluation in the emergency department.  Patient's wife feels comfortable transporting via POV and they decline EMS transport.  Patient is stable at this time and this seems reasonable.   Final Clinical Impressions(s) / UC Diagnoses   Final diagnoses:  None   Discharge Instructions   None    ED Prescriptions   None    PDMP not  reviewed this encounter.   Billy Asberry FALCON, PA-C 12/04/23 1726

## 2023-12-04 NOTE — ED Notes (Signed)
 Patient is being discharged from the Urgent Care and sent to the Emergency Department via POV . Per Myers,PA-C, patient is in need of higher level of care due to undocumented murmur heard on assessment. Patient is aware and verbalizes understanding of plan of care.  Vitals:   12/04/23 1545  BP: 120/72  Pulse: 78  Resp: 18  Temp: (!) 97.5 F (36.4 C)

## 2023-12-04 NOTE — Telephone Encounter (Signed)
 Returned call to wife who states his calves have been swelling a little bit, but nothing extraordinary. She was just wanting to get him scheduled sooner than April to see Dr Alveta. Scheduled for 12/24/22 and will call if he has a cancellation sooner. Is seeing his kidney doctor in 3 days. No concerns at this time and reports not SOB worse than baseline. Will call back if concerns.

## 2023-12-04 NOTE — ED Triage Notes (Signed)
 Pt reports he tripped and fell on 12/19 and has swelling to bilateral knees with abrasions to anterior lower legs and some swelling. He went to urgent care who sent him here to further evaluation due to the leg swelling. Hx of heart failure. Denies chest pain or new shortness of breath.  He is ambulatory with his walker.

## 2023-12-04 NOTE — Telephone Encounter (Signed)
 Copied from CRM [id number]. Topic: Clinical - Red Word Triage >> Dec 04, 2023  9:06 AM Gerardine PARAS wrote: Red Word that prompted transfer to Nurse Triage: Patients representative and patient called in regarding patient falling about a week ago- now has some soreness and swelling around knee, interested in possible xray Also has rash interested in being assessed - itching   Chief Complaint: bilateral knee swelling due to fall Symptoms: itchy rash on calves  Frequency: ongoing since last week Pertinent Negatives: Patient denies pain Disposition: [] ED /[x] Urgent Care (no appt availability in office) / [] Appointment(In office/virtual)/ []  [facility]/ [] Home Care/ [] Refused Recommended Disposition /[] [facility]/ []  Follow-up with PCP Additional Notes: The patient reported that he fell last week on both knees.  EMS came and helped him up. He initially did not have pain or swelling.  After two days, he began having knee swelling and a rash on his calves.  He continues to deny pain but his wife reported that his knees are swollen.  No discoloration noted but rash is itchy.  He takes Eliquis .  He was referred to urgent care for further evaluation as his normal pcp's office did not have availability.   Reason for Disposition  [1] High-risk adult (e.g., age > 60 years, osteoporosis, chronic steroid use) AND [2] limping  Answer Assessment - Initial Assessment Questions 1. MECHANISM: How did the injury happen? (e.g., twisting injury, direct blow)      Tripped and fell  2. ONSET: When did the injury happen? (Minutes or hours ago)       3. LOCATION: Where is the injury located?      Both knees  4. APPEARANCE of INJURY: What does the injury look like?      Swollen  5. SEVERITY: Can you put weight on that leg? Can you walk?      Able to walk 6. SIZE: For cuts, bruises, or swelling, ask: How large is it? (e.g., inches or centimeters;  entire joint)      Does not appear to  be bruised 7. PAIN: Is there pain? If Yes, ask: How bad is the pain?  What does it keep you from doing? (e.g., Scale 1-10; or mild, moderate, severe)   -  NONE: (0): no pain   -  MILD (1-3): doesn't interfere with normal activities    -  MODERATE (4-7): interferes with normal activities (e.g., work or school) or awakens from sleep, limping    -  SEVERE (8-10): excruciating pain, unable to do any normal activities, unable to walk     No pain  9. OTHER SYMPTOMS: Do you have any other symptoms?  (e.g., pop when knee injured, swelling, locking, buckling)      none  Protocols used: Knee Injury-A-AH

## 2023-12-04 NOTE — ED Triage Notes (Signed)
 Pt reports fall on 11/19/23. Tripped and hit his L knee. Reports slight swelling and abrasion to L knee. Pt still walking with his cane (his normal baseline). Reports some change in ROM since the fall. Pt also concerned about rash on bilateral lower legs. Reports it started 2 weeks ago and has gotten worse. Also having increased edema to both legs - unable to get into PCP until later in January.

## 2023-12-04 NOTE — Telephone Encounter (Signed)
 Called and spoke with patient, patient is heading to UC, patient was advised to Follow-up with Dr. [place] in a week.

## 2023-12-05 NOTE — ED Provider Notes (Signed)
 [place] EMERGENCY DEPARTMENT AT [facility] Provider Note  CSN: [id number] Arrival date & time: 12/04/23 1726  Chief Complaint(s) No chief complaint on file.  HPI Douglas Matthews is a 89 y.o. 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  Patient is endorsing dyspnea on exertion with extended ambulation.  Is able to get around the house without shortness of breath.  He denies any chest pain.  Patient had a mechanical fall 2 weeks ago landing on his knees.  He believes that that caused the lower leg swelling.  HPI  Past Medical History Past Medical History:  Diagnosis Date   Anemia    Atrial fibrillation (HCC)    Atrial flutter (HCC)    BPH (benign prostatic hypertrophy)    Chronic HFrEF (heart failure with reduced ejection fraction) (HCC)    Chronic kidney disease (CKD), active medical management without dialysis, stage 5 (HCC)    Hyperlipidemia    Hypertension    Hypothyroidism    LBBB (left bundle branch block)    Patient Active Problem List   Diagnosis Date Noted   Routine general medical examination at a health care facility 06/16/2023   Hypothyroidism 05/28/2023   CKD (chronic kidney disease), stage V (HCC) 04/06/2023   Prediabetes 04/06/2023   Persistent atrial fibrillation (HCC) 02/04/2023   Chronic anticoagulation 02/04/2023   AKI (acute kidney injury) (HCC) 01/28/2023   Atrial fibrillation, chronic (HCC) 01/28/2023   Heart failure with reduced ejection fraction (HCC) 01/28/2023   Metabolic acidosis 01/28/2023   Nausea 01/28/2023   CAP (community acquired pneumonia) 10/25/2022   Atrial fibrillation with RVR (HCC) 10/24/2022   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 Authorizing Provider  amiodarone  (PACERONE ) 200 MG tablet Take 1 tablet (200 mg total) by mouth daily. 09/03/23   Wyn Jackee VEAR Mickey., NP  calcitRIOL  (ROCALTROL ) 0.25 MCG capsule Take 0.25 mcg by mouth every other day.    [provider]  COD LIVER OIL PO Take 1 tablet by mouth daily.    [provider]  ELIQUIS  2.5 MG TABS tablet TAKE 1 TABLET BY MOUTH TWICE A DAY 11/19/23   Nahser, Aleene PARAS, MD  Garlic 1000 MG CAPS Take 1,000 mg by mouth daily.    [provider]  levothyroxine  (SYNTHROID ) 75 MCG tablet Take 75 mcg by mouth daily. Patient not taking: Reported on 12/04/2023 11/04/23   [provider]  levothyroxine  (SYNTHROID ) 88 MCG tablet TAKE 1 TABLET BY MOUTH EVERY DAY 11/20/23   [name], [name], MD  Multiple Vitamin (MULTIVITAMIN) capsule Take 1 capsule by mouth daily.    [provider]  torsemide  (DEMADEX ) 20 MG tablet Take 1 tablet (20 mg total) by mouth daily.  Patient not taking: Reported on 12/04/2023 08/19/23   [name], [name], MD  Torsemide  40 MG TABS Take 1 tablet by mouth daily at 6 (six) AM.    [provider]                                                                                                                                    Allergies Patient has no known allergies.  Review of Systems Review of Systems As noted in HPI  Physical Exam Vital Signs  I have reviewed  the triage vital signs BP 125/82 (BP Location: Left Arm)   Pulse 80   Temp 97.9 F (36.6 C)   Resp 17   Ht 5' 8 (1.727 m)   Wt 56.2 kg   SpO2 100%   BMI 18.85 kg/m   Physical Exam Vitals reviewed.  Constitutional:      General: He is not in acute distress.    Appearance: He is well-developed. He is not diaphoretic.  HENT:     Head: Normocephalic and atraumatic.     Nose: Nose normal.  Eyes:     General: No scleral icterus.       Right eye: No discharge.        Left eye: No discharge.     Conjunctiva/sclera: Conjunctivae normal.     Pupils: Pupils are equal, round, and reactive to light.  Cardiovascular:     Rate and Rhythm: Normal rate and regular rhythm.     Heart sounds: No murmur heard.    No friction rub. No gallop.     Comments: Edema up to upper thigh Pulmonary:     Effort: Pulmonary effort is normal. No respiratory distress.     Breath sounds: Normal breath sounds. No stridor. No rales.  Abdominal:     General: There is no distension.     Palpations: Abdomen is soft.     Tenderness: There is no abdominal tenderness.  Musculoskeletal:        General: No tenderness.     Cervical back: Normal range of motion and neck supple.     Right lower leg: 2+ Pitting Edema present.     Left lower leg: 2+ Pitting Edema present.  Skin:    General: Skin is warm and dry.     Findings: No erythema or rash.  Neurological:     Mental Status: He is alert and oriented to person, place, and time.     ED Results and Treatments Labs (all labs ordered are listed, but only abnormal results are displayed) Labs Reviewed  CBC - Abnormal; Notable for the following components:      Result Value   WBC 3.8 (*)    RBC 3.00 (*)    Hemoglobin 8.8 (*)    HCT 27.7 (*)    RDW 15.6 (*)    All other components within normal limits  COMPREHENSIVE METABOLIC PANEL - Abnormal;  Notable for the following components:   CO2 15 (*)    BUN 167 (*)    Creatinine, Ser 8.46 (*)    Calcium  7.9 (*)     Albumin  2.9 (*)    GFR, Estimated 6 (*)    Anion gap 17 (*)    All other components within normal limits  BRAIN NATRIURETIC PEPTIDE - Abnormal; Notable for the following components:   B Natriuretic Peptide 1,043.7 (*)    All other components within normal limits                                                                                                                         EKG  EKG Interpretation Date/Time:  Friday December 04 2023 18:00:43 EST Ventricular Rate:  75 PR Interval:  224 QRS Duration:  158 QT Interval:  478 QTC Calculation: 533 R Axis:   96  Text Interpretation: Sinus rhythm with 1st degree A-V block Rightward axis Non-specific intra-ventricular conduction block Minimal voltage criteria for LVH, may be normal variant ( Cornell product ) Abnormal ECG When compared with ECG of 17-Jul-2023 11:04, PREVIOUS ECG IS PRESENT No significant change was found Confirmed by Trine Likes [phone]) on 12/05/2023 3:58:52 AM       Radiology DG Knee Complete 4 Views Right Result Date: 12/04/2023 CLINICAL DATA:  Bilateral knee pain, swelling and abrasions following a fall on 11/19/2023. EXAM: RIGHT KNEE - COMPLETE 4+ VIEW COMPARISON:  None Available. FINDINGS: Medial and lateral meniscal calcifications. Mild tricompartmental degenerative spur formation. Moderate-sized effusion. No fracture or dislocation seen. Extensive atheromatous arterial calcifications. IMPRESSION: 1. Moderate-sized effusion. 2. No fracture. 3. Mild tricompartmental degenerative changes. 4. Chondrocalcinosis. Electronically Signed   By: Elspeth Bathe M.D.   On: 12/04/2023 20:21   DG Knee Complete 4 Views Left Result Date: 12/04/2023 CLINICAL DATA:  Bilateral knee swelling and abrasions following a fall on 11/19/2023. EXAM: LEFT KNEE - COMPLETE 4+ VIEW COMPARISON:  None Available. FINDINGS: Medial and lateral meniscal calcifications. Mild-to-moderate medial joint space narrowing. Mild-to-moderate medial and  patellofemoral spur formation and mild lateral spur formation. Moderate-sized effusion. No fracture or dislocation seen. Extensive atheromatous arterial calcifications. IMPRESSION: 1. Moderate-sized effusion. 2. No fracture. 3. Mild-to-moderate degenerative changes. 4. Medial and lateral meniscal chondrocalcinosis. Electronically Signed   By: Elspeth Bathe M.D.   On: 12/04/2023 20:20   DG Chest 2 View Result Date: 12/04/2023 CLINICAL DATA:  Bilateral leg swelling.  History of heart failure. EXAM: CHEST - 2 VIEW COMPARISON:  06/18/2023 FINDINGS: Stable enlarged cardiac silhouette. Clear lungs with normal vascularity. Stable mild chronic interstitial prominence at the lung bases. Stable mild peribronchial thickening and mild hyperexpansion of the lungs. Stable small amount of right pleural thickening. Mild thoracic spine degenerative changes and scoliosis. IMPRESSION: 1. No acute abnormality. 2. Stable cardiomegaly. 3. Stable mild changes of COPD chronic bronchitis. Electronically Signed   By: Elspeth Bathe M.D.   On: 12/04/2023 20:18    Medications Ordered in ED Medications - No  data to display Procedures Procedures  (including critical care time) Medical Decision Making / ED Course   Medical Decision Making Amount and/or Complexity of Data Reviewed Labs: ordered. Decision-making details documented in ED Course. Radiology: ordered and independent interpretation performed. Decision-making details documented in ED Course. ECG/medicine tests: ordered and independent interpretation performed. Decision-making details documented in ED Course.  Risk Decision regarding hospitalization.    Patient presents with worsening bilateral lower extremity edema. History of CHF and ESRD not on dialysis. Chest x-ray without evidence of pulmonary edema.  Patient is satting well on room air without supplemental oxygen.  Ambulated well without hypoxia. Labs show baseline renal function with a creatinine of 8.8.   Mildly increased BNP  I consulted nephrology and spoke with Dr. Rayburn who is the patient's nephrologist.  He recommended we increase patient's torsemide  dose from 50 mg daily to 50 mg twice daily.  Patient is already scheduled for close follow-up with Dr. Rayburn in 4 days.    Final Clinical Impression(s) / ED Diagnoses Final diagnoses:  Peripheral edema   The patient appears reasonably screened and/or stabilized for discharge and I doubt any other medical condition or other [facility] requiring further screening, evaluation, or treatment in the ED at this time. I have discussed the findings, Dx and Tx plan with the patient/family who expressed understanding and agree(s) with the plan. Discharge instructions discussed at length. The patient/family was given strict return precautions who verbalized understanding of the instructions. No further questions at time of discharge.  Disposition: Discharge  Condition: Good  ED Discharge Orders     None         Follow Up: Rayburn Pac, MD [address] [place] [address] [phone]  Go to  as scheduled  [name], [name], MD [address] [place] [address] [phone]  Call  as needed    This chart was dictated using voice recognition software.  Despite best efforts to proofread,  errors can occur which can change the documentation meaning.    Trine Raynell Moder, MD 12/05/23 [phone]

## 2023-12-05 NOTE — Discharge Instructions (Signed)
 Thank you for allowing us  to take care of you today.  We hope you begin feeling better soon.  To-Do: Please follow-up with Dr. Rayburn as scheduled for this upcoming Tuesday. He recommended you increase your torsemide  dose from 50 mg daily to 50 mg twice a day. Please return to the Emergency Department or call 911 if you experience chest pain, shortness of breath, severe pain, severe fever, altered mental status, or have any reason to think that you need emergency medical care.  Thank you again.  Hope you feel better soon.  Department of Emergency Medicine [facility]

## 2023-12-05 NOTE — ED Notes (Signed)
 Pt oxygen stayed at 98% while ambulated

## 2023-12-05 NOTE — ED Notes (Signed)
 Pt verbalized understanding of discharge instructions. Pt ambulated to wheelchair. Pt wheeled from ed. Family to drive home.

## 2023-12-05 NOTE — ED Notes (Signed)
 Pt able to stand at edge of bed and urinate with assistance

## 2023-12-07 ENCOUNTER — Ambulatory Visit (HOSPITAL_COMMUNITY)
Admission: RE | Admit: 2023-12-07 | Discharge: 2023-12-07 | Disposition: A | Discharge status: Home / Self Care | Payer: Medicare Other | Source: Ambulatory Visit | Attending: Nephrology | Admitting: Nephrology

## 2023-12-07 ENCOUNTER — Ambulatory Visit: Payer: Self-pay | Admitting: Family Medicine

## 2023-12-07 VITALS — BP 119/73 | HR 81 | Temp 97.3°F | Resp 16

## 2023-12-07 DIAGNOSIS — N185 Chronic kidney disease, stage 5: Secondary | ICD-10-CM | POA: Diagnosis not present

## 2023-12-07 DIAGNOSIS — D631 Anemia in chronic kidney disease: Secondary | ICD-10-CM | POA: Insufficient documentation

## 2023-12-07 LAB — IRON AND TIBC
Iron: 42 ug/dL — ABNORMAL LOW (ref 45–182)
Saturation Ratios: 12 % — ABNORMAL LOW (ref 17.9–39.5)
TIBC: 350 ug/dL (ref 250–450)
UIBC: 308 ug/dL

## 2023-12-07 LAB — RENAL FUNCTION PANEL
Albumin: 2.8 g/dL — ABNORMAL LOW (ref 3.5–5.0)
Anion gap: 17 — ABNORMAL HIGH (ref 5–15)
BUN: 165 mg/dL — ABNORMAL HIGH (ref 8–23)
CO2: 18 mmol/L — ABNORMAL LOW (ref 22–32)
Calcium: 7.9 mg/dL — ABNORMAL LOW (ref 8.9–10.3)
Chloride: 107 mmol/L (ref 98–111)
Creatinine, Ser: 8.69 mg/dL — ABNORMAL HIGH (ref 0.61–1.24)
GFR, Estimated: 5 mL/min — ABNORMAL LOW (ref >60)
Glucose, Bld: 100 mg/dL — ABNORMAL HIGH (ref 70–99)
Phosphorus: 9.3 mg/dL — ABNORMAL HIGH (ref 2.5–4.6)
Potassium: 5 mmol/L (ref 3.5–5.1)
Sodium: 142 mmol/L (ref 135–145)

## 2023-12-07 LAB — POCT HEMOGLOBIN-HEMACUE: Hemoglobin: 8 g/dL — ABNORMAL LOW (ref 13.0–17.0)

## 2023-12-07 LAB — FERRITIN: Ferritin: 74 ng/mL (ref 24–336)

## 2023-12-07 MED ORDER — DARBEPOETIN ALFA 60 MCG/0.3ML IJ SOSY
PREFILLED_SYRINGE | INTRAMUSCULAR | Status: AC
  Filled 2023-12-07: qty 0.3

## 2023-12-07 MED ORDER — DARBEPOETIN ALFA 60 MCG/0.3ML IJ SOSY
60.0000 ug | PREFILLED_SYRINGE | INTRAMUSCULAR | Status: DC
  Administered 2023-12-07: 60 ug via SUBCUTANEOUS

## 2023-12-07 NOTE — Telephone Encounter (Signed)
  Chief Complaint: Leg sores Symptoms: Itchiness and dryness Frequency: 5-6 days Pertinent Negatives: Patient denies pain and fever Disposition: [] ED /[] Urgent Care (no appt availability in office) / [x] Appointment(In office/virtual)/ []  [place] [facility]/ [] Home Care/ [] Refused Recommended Disposition /[] [facility]/ []  Follow-up with PCP Additional Notes: Patient's wife called on behalf of him reporting sores on his legs. Patient reports sores are filled with fluid and turn red and crust over when they pop. Patient complains of dryness and itchiness. Patient has been using Lupiderm ointment and denies relief. Patient denies pain and does not have a fever at this time. Patient was hospitalized over the weekend. Advised patient to be seen in the office. No availability at current PCP office until Wednesday. Scheduled in office visit tomorrow morning at [facility]. Advised patient to call back if symptoms worsen before then. Patient complied.   Reason for Disposition  [1] Unexplained sores AND [2] 3 or more  Answer Assessment - Initial Assessment Questions 1. APPEARANCE of SORES: What do the sores look like?     Fluid-filled blisters, skin turns red when blisters pop and then dry over  2. NUMBER: How many sores are there?     2-3 at the moment, but has had more than that since this started  3. SIZE: How big is the largest sore?     Smaller than a dime  4. LOCATION: Where are the sores located?     Both legs  5. ONSET: When did the sores begin?     5-6 days ago  6. TENDER: Does it hurt when you touch it?  (Scale 1-10; or mild, moderate, severe)      Denies pain  7. CAUSE: What do you think is causing the sores?     Denies cause, scratching makes it worse  8. OTHER SYMPTOMS: Do you have any other symptoms? (e.g., fever, new weakness)     Dryness and itchiness, using Lupiderm and skin is still dry  Protocols used: Sores-A-AH

## 2023-12-07 NOTE — Telephone Encounter (Signed)
 Copied from CRM [id number]. Topic: Clinical - Red Word Triage >> Dec 07, 2023  2:09 PM Robinson H wrote: [facility] that prompted transfer to Nurse Triage: Patients wife states patient is having some itching on legs with blisters that are draining liquid and red.

## 2023-12-07 NOTE — Telephone Encounter (Signed)
 Noted.

## 2023-12-08 ENCOUNTER — Ambulatory Visit (HOSPITAL_BASED_OUTPATIENT_CLINIC_OR_DEPARTMENT_OTHER): Payer: Medicare Other | Admitting: Family Medicine

## 2023-12-08 DIAGNOSIS — D638 Anemia in other chronic diseases classified elsewhere: Secondary | ICD-10-CM | POA: Diagnosis not present

## 2023-12-08 DIAGNOSIS — I120 Hypertensive chronic kidney disease with stage 5 chronic kidney disease or end stage renal disease: Secondary | ICD-10-CM | POA: Diagnosis not present

## 2023-12-08 DIAGNOSIS — N185 Chronic kidney disease, stage 5: Secondary | ICD-10-CM | POA: Diagnosis not present

## 2023-12-08 DIAGNOSIS — N2581 Secondary hyperparathyroidism of renal origin: Secondary | ICD-10-CM | POA: Diagnosis not present

## 2023-12-08 DIAGNOSIS — I5082 Biventricular heart failure: Secondary | ICD-10-CM | POA: Diagnosis not present

## 2023-12-14 ENCOUNTER — Ambulatory Visit (INDEPENDENT_AMBULATORY_CARE_PROVIDER_SITE_OTHER): Payer: Medicare Other | Admitting: Family Medicine

## 2023-12-14 ENCOUNTER — Encounter: Payer: Self-pay | Admitting: Family Medicine

## 2023-12-14 VITALS — BP 120/62 | HR 94 | Ht 68.0 in | Wt 146.4 lb

## 2023-12-14 DIAGNOSIS — N185 Chronic kidney disease, stage 5: Secondary | ICD-10-CM | POA: Diagnosis not present

## 2023-12-14 DIAGNOSIS — R600 Localized edema: Secondary | ICD-10-CM | POA: Diagnosis not present

## 2023-12-14 DIAGNOSIS — I5020 Unspecified systolic (congestive) heart failure: Secondary | ICD-10-CM

## 2023-12-14 DIAGNOSIS — E032 Hypothyroidism due to medicaments and other exogenous substances: Secondary | ICD-10-CM | POA: Diagnosis not present

## 2023-12-14 DIAGNOSIS — W19XXXA Unspecified fall, initial encounter: Secondary | ICD-10-CM | POA: Diagnosis not present

## 2023-12-14 DIAGNOSIS — L97921 Non-pressure chronic ulcer of unspecified part of left lower leg limited to breakdown of skin: Secondary | ICD-10-CM | POA: Diagnosis not present

## 2023-12-14 DIAGNOSIS — L97911 Non-pressure chronic ulcer of unspecified part of right lower leg limited to breakdown of skin: Secondary | ICD-10-CM | POA: Diagnosis not present

## 2023-12-14 DIAGNOSIS — I482 Chronic atrial fibrillation, unspecified: Secondary | ICD-10-CM

## 2023-12-14 MED ORDER — ALUM SULFATE-CA ACETATE EX PACK: 1.0000 | PACK | Freq: Three times a day (TID) | CUTANEOUS | 1 refills | Status: DC

## 2023-12-14 NOTE — Assessment & Plan Note (Addendum)
 Some of his co morbilities can be contributing factors. Torsemide  was recently increased to 100 mg, his wife thinks edema is gradually improving. He cannot wear compression stockings at this time because blisters. LE elevation a few times during the day and appropriate skin care.

## 2023-12-14 NOTE — Assessment & Plan Note (Signed)
 Rhythm controlled today. Currently on Amiodarone 200 mg daily and Eliquis 2.5 mg bid. Follows with cardiologist regularly.

## 2023-12-14 NOTE — Patient Instructions (Addendum)
 A few things to remember from today's visit:  Bilateral lower extremity edema  Hypothyroidism due to medication - Plan: TSH, T4, free  Ulcers of both lower legs, limited to breakdown of skin (HCC)  No changes today. Keep ulcers clean with soap and water. Uncover during the day and cover at night. Lower extremity elevation a few times during the day.  If you need refills for medications you take chronically, please call your pharmacy. Do not use My Chart to request refills or for acute issues that need immediate attention. If you send a my chart message, it may take a few days to be addressed, specially if I am not in the office.  Please be sure medication list is accurate. If a new problem present, please set up appointment sooner than planned today.

## 2023-12-14 NOTE — Progress Notes (Signed)
 ACUTE VISIT Chief Complaint  Patient presents with   Referral   HPI: Douglas Matthews is a 89 y.o. male with a PMHx significant for CKD V, HTN, HLD, prediabetes, HFrEF, LBBB, atrial fibrillation on chronic anticoagulation, and pulmonary HTN, who is here today with his wife c/o LE skin lesions and to discuses dermatology referral.  Patient complains of developing sores on his legs and feet for about 2 weeks. He denies pain but endorses some pruritus.  He says the swelling in his legs is going down.  Pertinent negatives include fever or chills. No new medications, new soaps, or new detergents.   CKD V: He saw nephrology on 1/7, and had his torsemide  increased to 100 mg.  He has declined dialysis. -States that he was told his heart isn't strong enough for dialysis.   Lab Results  Component Value Date   ALT 34 12/04/2023   AST 34 12/04/2023   ALKPHOS 82 12/04/2023   BILITOT 0.6 12/04/2023   Lab Results  Component Value Date   NA 142 12/07/2023   CL 107 12/07/2023   K 5.0 12/07/2023   CO2 18 (L) 12/07/2023   BUN 165 (H) 12/07/2023   CREATININE 8.69 (H) 12/07/2023   GFRNONAA 5 (L) 12/07/2023   CALCIUM  7.9 (L) 12/07/2023   PHOS 9.3 (H) 12/07/2023   ALBUMIN  2.8 (L) 12/07/2023   GLUCOSE 100 (H) 12/07/2023   Hypothyroidism:  Currently on levothyroxine  88 mcg daily.  Lab Results  Component Value Date   TSH 52.26 (H) 10/09/2023   He is taking vitamin D  supplements and a multivitamin.   HFrEF on Amiodarone  200 mg daily and Eliquis  2.5 mg bid. Denies orthopnea (sleeps on a pillow) or PND. Fatigue, mild DOE, he remains able to do things around the house.  Denies any chest pain.   His wife mentions he has fallen two times in the last 3-4 weeks. He denies any joint pain.   Diet: He is trying to eat protein, eating some chicken and fish. Occasional pizza or hamburgers but his wife is trying to cook more at home. His wife says he eats well in general.   He is under  palliative care. His wife has some questions about hospice care, she thinks he will need to move to a facility if he starts service.  Palliative care nurse visits q 2 weeks.  Review of Systems  Constitutional:  Positive for activity change and fatigue. Negative for chills and fever.  HENT:  Negative for sore throat.   Respiratory:  Negative for cough and wheezing.   Gastrointestinal:  Negative for abdominal pain, nausea and vomiting.  Endocrine: Negative for cold intolerance and heat intolerance.  Genitourinary:  Negative for decreased urine volume and hematuria.  Musculoskeletal:  Positive for gait problem.  Skin:  Positive for wound.  Neurological:  Negative for syncope and headaches.  Psychiatric/Behavioral:  Negative for confusion and hallucinations.   See other pertinent positives and negatives in HPI.  Current Outpatient Medications on File Prior to Visit  Medication Sig Dispense Refill   amiodarone  (PACERONE ) 200 MG tablet Take 1 tablet (200 mg total) by mouth daily. 90 tablet 3   calcitRIOL  (ROCALTROL ) 0.25 MCG capsule Take 0.25 mcg by mouth every other day.     COD LIVER OIL PO Take 1 tablet by mouth daily.     ELIQUIS  2.5 MG TABS tablet TAKE 1 TABLET BY MOUTH TWICE A DAY 180 tablet 1   Garlic 1000 MG CAPS Take 1,000 mg  by mouth daily.     levothyroxine  (SYNTHROID ) 88 MCG tablet TAKE 1 TABLET BY MOUTH EVERY DAY 90 tablet 1   Multiple Vitamin (MULTIVITAMIN) capsule Take 1 capsule by mouth daily.     torsemide  (DEMADEX ) 100 MG tablet Take 100 mg by mouth daily.     No current facility-administered medications on file prior to visit.    Past Medical History:  Diagnosis Date   Anemia    Atrial fibrillation (HCC)    Atrial flutter (HCC)    BPH (benign prostatic hypertrophy)    Chronic HFrEF (heart failure with reduced ejection fraction) (HCC)    Chronic kidney disease (CKD), active medical management without dialysis, stage 5 (HCC)    Hyperlipidemia    Hypertension     Hypothyroidism    LBBB (left bundle branch block)    No Known Allergies  Social History   Socioeconomic History   Marital status: Married    Spouse name: Not on file   Number of children: Not on file   Years of education: Not on file   Highest education level: Not on file  Occupational History   Occupation: Retired    Comment: [employer]  Tobacco Use   Smoking status: Never   Smokeless tobacco: Never  Vaping Use   Vaping status: Never Used  Substance and Sexual Activity   Alcohol  use: No   Drug use: No   Sexual activity: Not on file  Other Topics Concern   Not on file  Social History Narrative   Married x 57 years    Right handed    Social Drivers of Health   Financial Resource Strain: Low Risk  (07/08/2022)   Overall Financial Resource Strain (CARDIA)    Difficulty of Paying Living Expenses: Not hard at all  Food Insecurity: No Food Insecurity (02/09/2023)   Hunger Vital Sign    Worried About Running Out of Food in the Last Year: Never true    Ran Out of Food in the Last Year: Never true  Transportation Needs: No Transportation Needs (09/07/2023)   PRAPARE - Administrator, Civil Service (Medical): No    Lack of Transportation (Non-Medical): No  Physical Activity: Insufficiently Active (07/08/2022)   Exercise Vital Sign    Days of Exercise per Week: 2 days    Minutes of Exercise per Session: 30 min  Stress: No Stress Concern Present (07/08/2022)   [employer] of Occupational Health - Occupational Stress Questionnaire    Feeling of Stress : Not at all  Social Connections: Moderately Integrated (07/02/2021)   Social Connection and Isolation Panel [NHANES]    Frequency of Communication with Friends and Family: Three times a week    Frequency of Social Gatherings with Friends and Family: Three times a week    Attends Religious Services: More than 4 times per year    Active Member of Clubs or Organizations: No    Attends Banker Meetings: Never     Marital Status: Married    Vitals:   12/14/23 1414  BP: 120/62  Pulse: 94  SpO2: 96%   Body mass index is 22.26 kg/m.  Physical Exam Vitals and nursing note reviewed.  Constitutional:      General: He is not in acute distress.    Appearance: He is well-developed.  HENT:     Head: Normocephalic and atraumatic.  Eyes:     Conjunctiva/sclera: Conjunctivae normal.  Cardiovascular:     Rate and Rhythm: Normal rate and regular  rhythm.     Heart sounds: No murmur heard. Pulmonary:     Effort: Pulmonary effort is normal. No respiratory distress.     Breath sounds: Normal breath sounds.  Abdominal:     Palpations: Abdomen is soft.     Tenderness: There is no abdominal tenderness.  Musculoskeletal:     Right lower leg: 3+ Edema present.     Left lower leg: 3+ Edema present.  Skin:    General: Skin is warm.     Findings: No erythema or rash.          Comments: Blister x 1 (2 cm,firm,clear content), superficial ulcers where prior blisters have bursted. X 3 (Pretibial x 2, biggest one 4 cm) and on dorsum of foot about 3-4 cm. Clear exudate. No signs of infection   Neurological:     Mental Status: He is alert and oriented to person, place, and time.     Comments: Unstable gait assisted with a cane.  Psychiatric:        Mood and Affect: Mood and affect normal.   ASSESSMENT AND PLAN:  Mr. Degrace was seen today for sores on his legs.   Lab Results  Component Value Date   TSH 50.62 (H) 12/14/2023   Ulcers of both lower legs, limited to breakdown of skin (HCC) No signs of infection, clear exudate. Keep areas clean with soap and water. Wound care will be arranged through [facility]. [place] mixed in water, compressions on affected areas to try to help with exudate. Instructed about warn ing signs.  -     Ambulatory referral to Home Health -     Alum Sulfate-Ca Acetate; Apply 1 packet topically 3 (three) times daily. Mixed it in water.  Dispense: 100 each; Refill: 1  Bilateral  lower extremity edema Assessment & Plan: Some of his co morbilities can be contributing factors. Torsemide  was recently increased to 100 mg, his wife thinks edema is gradually improving. He cannot wear compression stockings at this time because blisters. LE elevation a few times during the day and appropriate skin care.  Hypothyroidism due to medication Assessment & Plan: On Amiodarone . Problem is not well controlled. He states he is taking Levothyroxine  88 mcg daily as instructed. Further recommendations according to TSH result.  Orders: -     TSH; Future -     T4, free; Future  CKD (chronic kidney disease), stage V (HCC) Assessment & Plan: Last e GFR 5.0 on 12/07/23. Has declined dialysis. Follows with nephrologist regularly.  Heart failure with reduced ejection fraction [facility]) Assessment & Plan: Echo in 01/2023 EF 20-25%. Torsemide  100 mg daily, recently increased. Follows with cardiologist regularly.  Atrial fibrillation, chronic (HCC) Assessment & Plan: Rhythm controlled today. Currently on Amiodarone  200 mg daily and Eliquis  2.5 mg bid. Follows with cardiologist regularly.  Fall, initial encounter Fall precautions discussed. Denies any joint pain or changes in ROM< he is not interested in imaging today.  I spent a total of 45 minutes in both face to face and non face to face activities for this visit on the date of this encounter. During this time history was obtained and documented, examination was performed, prior labs/imaging reviewed, and assessment/plan discussed. In regard to hospice care, we discussed criteria + general education, he can stay home while receiving services. He is gradually declining,currently he is being followed by palliative care.   Return if symptoms worsen or fail to improve.  LILLETTE Leonce JINNY Bridget, acting as a neurosurgeon for Jakeisha Stricker,  MD., have documented all relevant documentation on the behalf of Masato Pettie, MD, as directed by  Ameshia Pewitt, MD while in the presence of Gayatri Teasdale, MD.   I, Pharell Rolfson, MD, have reviewed all documentation for this visit. The documentation on 12/14/23 for the exam, diagnosis, procedures, and orders are all accurate and complete.  Tasheem Elms G. Petra Sargeant, MD  [facility]. [place] office.

## 2023-12-14 NOTE — Assessment & Plan Note (Signed)
 Echo in 01/2023 EF 20-25%. Torsemide 100 mg daily, recently increased. Continue low salt diet. Follows with cardiologist regularly.

## 2023-12-14 NOTE — Assessment & Plan Note (Addendum)
 Last e GFR 5.0 on 12/07/23. Has declined dialysis. Follows with nephrologist regularly.

## 2023-12-14 NOTE — Assessment & Plan Note (Signed)
 On Amiodarone. Problem is not well controlled. He states he is taking Levothyroxine 88 mcg daily as instructed. Further recommendations according to TSH result.

## 2023-12-15 LAB — T4, FREE: Free T4: 0.64 ng/dL (ref 0.60–1.60)

## 2023-12-15 LAB — TSH: TSH: 50.62 u[IU]/mL — ABNORMAL HIGH (ref 0.35–5.50)

## 2023-12-15 MED ORDER — LEVOTHYROXINE SODIUM 100 MCG PO TABS: 100.0000 ug | ORAL_TABLET | Freq: Every day | ORAL | 0 refills | Status: DC

## 2023-12-17 ENCOUNTER — Other Ambulatory Visit: Payer: Self-pay | Admitting: Family Medicine

## 2023-12-17 NOTE — Telephone Encounter (Signed)
Copied from CRM [id number]. Topic: Clinical - Prescription Issue >> Dec 17, 2023  3:14 PM Fredrich Romans wrote: Reason for CRM: patients wife called in stating that pharmacy doesn't have rx for  torsemide (DEMADEX) 100 MG tablet that was suppose to be sent in for patient on 12/14/2023, and she said [id number] - [place], [place] - [address]  Phone: [phone] Fax: [phone]  that did not have aluminum sulfate-calcium acetate (DOMEBORO) packet  in stock.She si going to check with another pharmacy to see if they have it.

## 2023-12-22 ENCOUNTER — Telehealth: Payer: Self-pay | Admitting: Family Medicine

## 2023-12-22 NOTE — Telephone Encounter (Signed)
Copied from CRM [id number]. Topic: Referral - Question >> Dec 22, 2023 10:58 AM Adele Barthel wrote: Reason for CRM: Patient's wife is calling in to have more clarification on a recent referral, would not provide additional information on what she was needing clarification on. CB# [phone]

## 2023-12-24 ENCOUNTER — Encounter: Payer: Self-pay | Admitting: Cardiovascular Disease

## 2023-12-24 DIAGNOSIS — M1990 Unspecified osteoarthritis, unspecified site: Secondary | ICD-10-CM | POA: Diagnosis not present

## 2023-12-24 DIAGNOSIS — L97522 Non-pressure chronic ulcer of other part of left foot with fat layer exposed: Secondary | ICD-10-CM | POA: Diagnosis not present

## 2023-12-24 DIAGNOSIS — R7303 Prediabetes: Secondary | ICD-10-CM | POA: Diagnosis not present

## 2023-12-24 DIAGNOSIS — E785 Hyperlipidemia, unspecified: Secondary | ICD-10-CM | POA: Diagnosis not present

## 2023-12-24 DIAGNOSIS — H539 Unspecified visual disturbance: Secondary | ICD-10-CM | POA: Diagnosis not present

## 2023-12-24 DIAGNOSIS — I482 Chronic atrial fibrillation, unspecified: Secondary | ICD-10-CM | POA: Diagnosis not present

## 2023-12-24 DIAGNOSIS — Z7901 Long term (current) use of anticoagulants: Secondary | ICD-10-CM | POA: Diagnosis not present

## 2023-12-24 DIAGNOSIS — N185 Chronic kidney disease, stage 5: Secondary | ICD-10-CM | POA: Diagnosis not present

## 2023-12-24 DIAGNOSIS — L97822 Non-pressure chronic ulcer of other part of left lower leg with fat layer exposed: Secondary | ICD-10-CM | POA: Diagnosis not present

## 2023-12-24 DIAGNOSIS — I739 Peripheral vascular disease, unspecified: Secondary | ICD-10-CM | POA: Diagnosis not present

## 2023-12-24 DIAGNOSIS — I132 Hypertensive heart and chronic kidney disease with heart failure and with stage 5 chronic kidney disease, or end stage renal disease: Secondary | ICD-10-CM | POA: Diagnosis not present

## 2023-12-24 DIAGNOSIS — L97812 Non-pressure chronic ulcer of other part of right lower leg with fat layer exposed: Secondary | ICD-10-CM | POA: Diagnosis not present

## 2023-12-24 DIAGNOSIS — E038 Other specified hypothyroidism: Secondary | ICD-10-CM | POA: Diagnosis not present

## 2023-12-24 DIAGNOSIS — N400 Enlarged prostate without lower urinary tract symptoms: Secondary | ICD-10-CM | POA: Diagnosis not present

## 2023-12-24 DIAGNOSIS — D631 Anemia in chronic kidney disease: Secondary | ICD-10-CM | POA: Diagnosis not present

## 2023-12-24 DIAGNOSIS — L97512 Non-pressure chronic ulcer of other part of right foot with fat layer exposed: Secondary | ICD-10-CM | POA: Diagnosis not present

## 2023-12-24 DIAGNOSIS — Z9181 History of falling: Secondary | ICD-10-CM | POA: Diagnosis not present

## 2023-12-24 DIAGNOSIS — I447 Left bundle-branch block, unspecified: Secondary | ICD-10-CM | POA: Diagnosis not present

## 2023-12-24 DIAGNOSIS — I5022 Chronic systolic (congestive) heart failure: Secondary | ICD-10-CM | POA: Diagnosis not present

## 2023-12-24 DIAGNOSIS — I872 Venous insufficiency (chronic) (peripheral): Secondary | ICD-10-CM | POA: Diagnosis not present

## 2023-12-24 DIAGNOSIS — I4892 Unspecified atrial flutter: Secondary | ICD-10-CM | POA: Diagnosis not present

## 2023-12-24 DIAGNOSIS — I272 Pulmonary hypertension, unspecified: Secondary | ICD-10-CM | POA: Diagnosis not present

## 2023-12-24 NOTE — Progress Notes (Signed)
Cardiology Office Note:    Date:  12/25/2023   ID:  Douglas Matthews, DOB 1935-11-25, MRN [id number]  PCP:  [place], [name], MD   [facility] Providers Cardiologist:  Kristeen Miss, MD {     Referring MD: [place], [name], MD   Chief Complaint  Patient presents with   Atrial Fibrillation        Congestive Heart Failure          History of Present Illness:    Douglas Matthews is a 89 y.o. male with a hx of  Atrial fib/ flutter, HLD, HTN  I met him in the hospital in Nov. 2023 with atrial flutter, new LBBB ,  Has new Dx of CHF Had successful TEE / CV  Is on eliquis 2.5 BID   Breathing has continued to improve.   Pt is on coreg 12.5 BID , hydralazine 10 mg TId, , Imdur 30 mg a day  Has lasix as needed - has not needed it    Feb. 20, 2024 Complains that he cannot taste his food , not covid related.   Occasionally feels fatigued  Still exercising ,  Goes to rehab.  Echo from Nov, 2023 shows EF 25-30% Repeat echo is scheduled for 2 weeks    He has gone back into atrial fib .  Apr 21, 2023 Douglas Matthews is seen for follow up of his atrial fib , HTN  Creatinine is 4.3 Difficult to add any ARB or Entresto   On exam he has rapid Afib  Will load with amio Have him return to see an APP in 4 weeks to set up DC cardioversin    Jan. 24, 2025 Douglas Matthews is seen for follow up of his atrial fib, HTN Has CKD - Creatinine is 8.69  No bleed in his stool  He thinks he is urinating too much and would like to cut his torsemide back to to 50 mg a day .    He has end stage renal disease .   I will defer to Dr. Leodis Binet for further management of his diuresis     Past Medical History:  Diagnosis Date   Anemia    Atrial fibrillation (HCC)    Atrial flutter (HCC)    BPH (benign prostatic hypertrophy)    Chronic HFrEF (heart failure with reduced ejection fraction) (HCC)    Chronic kidney disease (CKD), active medical management without dialysis, stage 5 (HCC)     Hyperlipidemia    Hypertension    Hypothyroidism    LBBB (left bundle branch block)     Past Surgical History:  Procedure Laterality Date   APPENDECTOMY     CARDIOVERSION N/A 10/29/2022   Procedure: CARDIOVERSION;  Surgeon: Lewayne Bunting, MD;  Location: [facility] ENDOSCOPY;  Service: Cardiovascular;  Laterality: N/A;   CARDIOVERSION N/A 05/21/2023   Procedure: CARDIOVERSION;  Surgeon: [name], MD;  Location: [facility] INVASIVE CV LAB;  Service: Cardiovascular;  Laterality: N/A;   HERNIA REPAIR     TEE WITHOUT CARDIOVERSION N/A 10/29/2022   Procedure: TRANSESOPHAGEAL ECHOCARDIOGRAM (TEE);  Surgeon: Lewayne Bunting, MD;  Location: [facility] ENDOSCOPY;  Service: Cardiovascular;  Laterality: N/A;    Current Medications: Current Meds  Medication Sig   aluminum sulfate-calcium acetate (DOMEBORO) packet Apply 1 packet topically 3 (three) times daily. Mixed it in water.   amiodarone (PACERONE) 200 MG tablet Take 1 tablet (200 mg total) by mouth daily.   calcitRIOL (ROCALTROL) 0.25 MCG capsule Take 0.25 mcg by  mouth every other day.   COD LIVER OIL PO Take 1 tablet by mouth daily.   ELIQUIS 2.5 MG TABS tablet TAKE 1 TABLET BY MOUTH TWICE A DAY   Garlic 1000 MG CAPS Take 1,000 mg by mouth daily.   levothyroxine (SYNTHROID) 100 MCG tablet Take 1 tablet (100 mcg total) by mouth daily.   Multiple Vitamin (MULTIVITAMIN) capsule Take 1 capsule by mouth daily.   torsemide (DEMADEX) 100 MG tablet Take 1 tablet (100 mg total) by mouth daily.     Allergies:   Patient has no known allergies.   Social History   Socioeconomic History   Marital status: Married    Spouse name: Not on file   Number of children: Not on file   Years of education: Not on file   Highest education level: Not on file  Occupational History   Occupation: Retired    Comment: [employer]  Tobacco Use   Smoking status: Never   Smokeless tobacco: Never  Vaping Use   Vaping status: Never Used  Substance and Sexual Activity   Alcohol  use: No   Drug use: No   Sexual activity: Not on file  Other Topics Concern   Not on file  Social History Narrative   Married x 57 years    Right handed    Social Drivers of Health   Financial Resource Strain: Low Risk  (07/08/2022)   Overall Financial Resource Strain (CARDIA)    Difficulty of Paying Living Expenses: Not hard at all  Food Insecurity: No Food Insecurity (02/09/2023)   Hunger Vital Sign    Worried About Running Out of Food in the Last Year: Never true    Ran Out of Food in the Last Year: Never true  Transportation Needs: No Transportation Needs (09/07/2023)   PRAPARE - Administrator, Civil Service (Medical): No    Lack of Transportation (Non-Medical): No  Physical Activity: Insufficiently Active (07/08/2022)   Exercise Vital Sign    Days of Exercise per Week: 2 days    Minutes of Exercise per Session: 30 min  Stress: No Stress Concern Present (07/08/2022)   [employer] of Occupational Health - Occupational Stress Questionnaire    Feeling of Stress : Not at all  Social Connections: Moderately Integrated (07/02/2021)   Social Connection and Isolation Panel [NHANES]    Frequency of Communication with Friends and Family: Three times a week    Frequency of Social Gatherings with Friends and Family: Three times a week    Attends Religious Services: More than 4 times per year    Active Member of Clubs or Organizations: No    Attends Banker Meetings: Never    Marital Status: Married     Family History: The patient's family history includes Diabetes in his son; Heart disease in an other family member.  ROS:   Please see the history of present illness.     All other systems reviewed and are negative.  EKGs/Labs/Other Studies Reviewed:    The following studies were reviewed today:    Recent Labs: 02/03/2023: Pro B Natriuretic peptide (BNP) 1,760.0 02/04/2023: Magnesium 2.4 12/04/2023: ALT 34; B Natriuretic Peptide 1,043.7; Platelets  175 12/07/2023: BUN 165; Creatinine, Ser 8.69; Hemoglobin 8.0; Potassium 5.0; Sodium 142 12/14/2023: TSH 50.62  Recent Lipid Panel    Component Value Date/Time   CHOL 259 (H) 05/08/2021 0950   TRIG 87.0 05/08/2021 0950   HDL 59.40 05/08/2021 0950   CHOLHDL 4 05/08/2021 0950  VLDL 17.4 05/08/2021 0950   LDLCALC 182 (H) 05/08/2021 0950   LDLDIRECT 220.5 12/15/2011 1137     Risk Assessment/Calculations:    CHA2DS2-VASc Score =     This indicates a  % annual risk of stroke. The patient's score is based upon:           Physical Exam:     Physical Exam: Blood pressure 122/72, pulse 86, height 5\' 8"  (1.727 m), weight 137 lb 3.2 oz (62.2 kg), SpO2 99%.       GEN:  chronically ill appearing man.  in no acute distress HEENT: Normal NECK: No JVD; No carotid bruits LYMPHATICS: No lymphadenopathy CARDIAC:   RR , 2-3/6 systolic murmur at LSB  RESPIRATORY:  Clear to auscultation without rales, wheezing or rhonchi  ABDOMEN: Soft, non-tender, non-distended MUSCULOSKELETAL:  No edema; No deformity  SKIN: Warm and dry NEUROLOGIC:  Alert and oriented x 3     EKG:        ASSESSMENT:    1. Atrial fibrillation, chronic (HCC)   2. CKD (chronic kidney disease), stage V (HCC)   3. RVF (right ventricular failure) (HCC)      PLAN:       Acute on chronic combined systolic and diastolic congestive heart failure:   Echo echocardiogram from November, 2023 reveals severe LV dysfunction with an EF of 25 to 30%.  He has severely reduced right ventricular function.  He has moderate pulmonary artery hypertension.   2.  Atrial fibrillation: He remains in normal sinus rhythm.  3.  End-stage renal disease: His creatinine is 8.6.  I will defer to nephrology regarding his torsemide dosing.   Follow up with Dr. Antoine Poche in 6 months .    Medication Adjustments/Labs and Tests Ordered: Current medicines are reviewed at length with the patient today.  Concerns regarding medicines are  outlined above.  No orders of the defined types were placed in this encounter.  No orders of the defined types were placed in this encounter.   Patient Instructions  Follow-Up: At [facility], you and your health needs are our priority.  As part of our continuing mission to provide you with exceptional heart care, we have created designated Provider Care Teams.  These Care Teams include your primary Cardiologist (physician) and Advanced Practice Providers (APPs -  Physician Assistants and Nurse Practitioners) who all work together to provide you with the care you need, when you need it.  We recommend signing up for the patient portal called "MyChart".  Sign up information is provided on this After Visit Summary.  MyChart is used to connect with patients for Virtual Visits (Telemedicine).  Patients are able to view lab/test results, encounter notes, upcoming appointments, etc.  Non-urgent messages can be sent to your provider as well.   To learn more about what you can do with MyChart, go to [email or web address].    Your next appointment:   6 month(s)  Provider:   Dr. Antoine Poche Other Instructions   1st Floor: - Lobby - Registration  - Pharmacy  - Lab - Cafe  2nd Floor: - PV Lab - Diagnostic Testing (echo, CT, nuclear med)  3rd Floor: - Vacant  4th Floor: - TCTS (cardiothoracic surgery) - AFib Clinic - Structural Heart Clinic - Vascular Surgery  - Vascular Ultrasound  5th Floor: - HeartCare Cardiology (general and EP) - Clinical Pharmacy for coumadin, hypertension, lipid, weight-loss medications, and med management appointments    Valet parking services will be available as well.  Signed, Kristeen Miss, MD  12/25/2023 2:21 PM    [facility]

## 2023-12-25 ENCOUNTER — Encounter: Payer: Self-pay | Admitting: Cardiovascular Disease

## 2023-12-25 ENCOUNTER — Ambulatory Visit: Payer: Medicare Other | Attending: Cardiovascular Disease | Admitting: Cardiovascular Disease

## 2023-12-25 VITALS — BP 122/72 | HR 86 | Ht 68.0 in | Wt 137.2 lb

## 2023-12-25 DIAGNOSIS — N185 Chronic kidney disease, stage 5: Secondary | ICD-10-CM | POA: Diagnosis not present

## 2023-12-25 DIAGNOSIS — I482 Chronic atrial fibrillation, unspecified: Secondary | ICD-10-CM | POA: Diagnosis not present

## 2023-12-25 DIAGNOSIS — I50810 Right heart failure, unspecified: Secondary | ICD-10-CM | POA: Diagnosis not present

## 2023-12-25 NOTE — Patient Instructions (Signed)
Follow-Up: At [facility], you and your health needs are our priority.  As part of our continuing mission to provide you with exceptional heart care, we have created designated Provider Care Teams.  These Care Teams include your primary Cardiologist (physician) and Advanced Practice Providers (APPs -  Physician Assistants and Nurse Practitioners) who all work together to provide you with the care you need, when you need it.  We recommend signing up for the patient portal called "MyChart".  Sign up information is provided on this After Visit Summary.  MyChart is used to connect with patients for Virtual Visits (Telemedicine).  Patients are able to view lab/test results, encounter notes, upcoming appointments, etc.  Non-urgent messages can be sent to your provider as well.   To learn more about what you can do with MyChart, go to [email or web address].    Your next appointment:   6 month(s)  Provider:   Dr. Antoine Poche Other Instructions   1st Floor: - Lobby - Registration  - Pharmacy  - Lab - Cafe  2nd Floor: - PV Lab - Diagnostic Testing (echo, CT, nuclear med)  3rd Floor: - Vacant  4th Floor: - TCTS (cardiothoracic surgery) - AFib Clinic - Structural Heart Clinic - Vascular Surgery  - Vascular Ultrasound  5th Floor: - HeartCare Cardiology (general and EP) - Clinical Pharmacy for coumadin, hypertension, lipid, weight-loss medications, and med management appointments    Valet parking services will be available as well.

## 2023-12-25 NOTE — Telephone Encounter (Signed)
I called and spoke with patient's wife. She had wanted to make sure that they would only be receiving services from one company and not two. They did have [facility] for pallative care prior to getting [facility] home health. [facility] is offering more of what they need right now, so they do want to stick with them. [facility] did reach out to [facility] earlier this week to let them know that they were taking over home health services. I told pt's wife to let me know what they say on Monday to see if we need to do anything to help make sure that they aren't receiving care from two separate companies so that insurance will pay. She will let me know if she runs into any issues. Nothing further needed at this time.

## 2023-12-26 DIAGNOSIS — L97822 Non-pressure chronic ulcer of other part of left lower leg with fat layer exposed: Secondary | ICD-10-CM | POA: Diagnosis not present

## 2023-12-26 DIAGNOSIS — L97522 Non-pressure chronic ulcer of other part of left foot with fat layer exposed: Secondary | ICD-10-CM | POA: Diagnosis not present

## 2023-12-26 DIAGNOSIS — L97512 Non-pressure chronic ulcer of other part of right foot with fat layer exposed: Secondary | ICD-10-CM | POA: Diagnosis not present

## 2023-12-26 DIAGNOSIS — I872 Venous insufficiency (chronic) (peripheral): Secondary | ICD-10-CM | POA: Diagnosis not present

## 2023-12-26 DIAGNOSIS — I132 Hypertensive heart and chronic kidney disease with heart failure and with stage 5 chronic kidney disease, or end stage renal disease: Secondary | ICD-10-CM | POA: Diagnosis not present

## 2023-12-26 DIAGNOSIS — L97812 Non-pressure chronic ulcer of other part of right lower leg with fat layer exposed: Secondary | ICD-10-CM | POA: Diagnosis not present

## 2023-12-28 ENCOUNTER — Telehealth: Payer: Self-pay

## 2023-12-28 DIAGNOSIS — L97512 Non-pressure chronic ulcer of other part of right foot with fat layer exposed: Secondary | ICD-10-CM | POA: Diagnosis not present

## 2023-12-28 DIAGNOSIS — I872 Venous insufficiency (chronic) (peripheral): Secondary | ICD-10-CM | POA: Diagnosis not present

## 2023-12-28 DIAGNOSIS — L97822 Non-pressure chronic ulcer of other part of left lower leg with fat layer exposed: Secondary | ICD-10-CM | POA: Diagnosis not present

## 2023-12-28 DIAGNOSIS — I132 Hypertensive heart and chronic kidney disease with heart failure and with stage 5 chronic kidney disease, or end stage renal disease: Secondary | ICD-10-CM | POA: Diagnosis not present

## 2023-12-28 DIAGNOSIS — L97812 Non-pressure chronic ulcer of other part of right lower leg with fat layer exposed: Secondary | ICD-10-CM | POA: Diagnosis not present

## 2023-12-28 DIAGNOSIS — L97522 Non-pressure chronic ulcer of other part of left foot with fat layer exposed: Secondary | ICD-10-CM | POA: Diagnosis not present

## 2023-12-28 NOTE — Telephone Encounter (Signed)
Verbal given to [place].

## 2023-12-28 NOTE — Telephone Encounter (Signed)
Copied from CRM [id number]. Topic: Clinical - Home Health Verbal Orders >> Dec 28, 2023 11:45 AM Larwance Sachs wrote: Caller/Agency: Jeanella Craze  Callback Number: [phone] Service Requested: Occupational Therapy Frequency: One time a week 4 weeks 1 time at every other week 2 visit  Any new concerns about the patient? No

## 2023-12-29 DIAGNOSIS — L97512 Non-pressure chronic ulcer of other part of right foot with fat layer exposed: Secondary | ICD-10-CM | POA: Diagnosis not present

## 2023-12-29 DIAGNOSIS — I132 Hypertensive heart and chronic kidney disease with heart failure and with stage 5 chronic kidney disease, or end stage renal disease: Secondary | ICD-10-CM | POA: Diagnosis not present

## 2023-12-29 DIAGNOSIS — I872 Venous insufficiency (chronic) (peripheral): Secondary | ICD-10-CM | POA: Diagnosis not present

## 2023-12-29 DIAGNOSIS — L97812 Non-pressure chronic ulcer of other part of right lower leg with fat layer exposed: Secondary | ICD-10-CM | POA: Diagnosis not present

## 2023-12-29 DIAGNOSIS — L97822 Non-pressure chronic ulcer of other part of left lower leg with fat layer exposed: Secondary | ICD-10-CM | POA: Diagnosis not present

## 2023-12-29 DIAGNOSIS — L97522 Non-pressure chronic ulcer of other part of left foot with fat layer exposed: Secondary | ICD-10-CM | POA: Diagnosis not present

## 2023-12-30 DIAGNOSIS — I872 Venous insufficiency (chronic) (peripheral): Secondary | ICD-10-CM | POA: Diagnosis not present

## 2023-12-30 DIAGNOSIS — I132 Hypertensive heart and chronic kidney disease with heart failure and with stage 5 chronic kidney disease, or end stage renal disease: Secondary | ICD-10-CM | POA: Diagnosis not present

## 2023-12-30 DIAGNOSIS — L97522 Non-pressure chronic ulcer of other part of left foot with fat layer exposed: Secondary | ICD-10-CM | POA: Diagnosis not present

## 2023-12-30 DIAGNOSIS — L97512 Non-pressure chronic ulcer of other part of right foot with fat layer exposed: Secondary | ICD-10-CM | POA: Diagnosis not present

## 2023-12-30 DIAGNOSIS — L97812 Non-pressure chronic ulcer of other part of right lower leg with fat layer exposed: Secondary | ICD-10-CM | POA: Diagnosis not present

## 2023-12-30 DIAGNOSIS — L97822 Non-pressure chronic ulcer of other part of left lower leg with fat layer exposed: Secondary | ICD-10-CM | POA: Diagnosis not present

## 2023-12-31 ENCOUNTER — Observation Stay (HOSPITAL_COMMUNITY)
Admission: EM | Admit: 2023-12-31 | Discharge: 2024-01-03 | Disposition: A | Discharge status: Home / Self Care | Payer: Medicare Other | Attending: Internal Medicine | Admitting: Internal Medicine

## 2023-12-31 ENCOUNTER — Encounter (HOSPITAL_COMMUNITY): Payer: Self-pay | Admitting: *Deleted

## 2023-12-31 ENCOUNTER — Other Ambulatory Visit: Payer: Self-pay

## 2023-12-31 DIAGNOSIS — D649 Anemia, unspecified: Secondary | ICD-10-CM

## 2023-12-31 DIAGNOSIS — Z7901 Long term (current) use of anticoagulants: Secondary | ICD-10-CM

## 2023-12-31 DIAGNOSIS — Z515 Encounter for palliative care: Secondary | ICD-10-CM | POA: Insufficient documentation

## 2023-12-31 DIAGNOSIS — I10 Essential (primary) hypertension: Secondary | ICD-10-CM | POA: Diagnosis not present

## 2023-12-31 DIAGNOSIS — R000 Tachycardia, unspecified: Secondary | ICD-10-CM | POA: Diagnosis not present

## 2023-12-31 DIAGNOSIS — R4589 Other symptoms and signs involving emotional state: Secondary | ICD-10-CM | POA: Diagnosis not present

## 2023-12-31 DIAGNOSIS — Z7189 Other specified counseling: Secondary | ICD-10-CM | POA: Insufficient documentation

## 2023-12-31 DIAGNOSIS — Z79899 Other long term (current) drug therapy: Secondary | ICD-10-CM | POA: Diagnosis not present

## 2023-12-31 DIAGNOSIS — I5032 Chronic diastolic (congestive) heart failure: Secondary | ICD-10-CM | POA: Diagnosis not present

## 2023-12-31 DIAGNOSIS — K922 Gastrointestinal hemorrhage, unspecified: Principal | ICD-10-CM

## 2023-12-31 DIAGNOSIS — K648 Other hemorrhoids: Secondary | ICD-10-CM | POA: Diagnosis not present

## 2023-12-31 DIAGNOSIS — I50810 Right heart failure, unspecified: Secondary | ICD-10-CM | POA: Diagnosis present

## 2023-12-31 DIAGNOSIS — L97822 Non-pressure chronic ulcer of other part of left lower leg with fat layer exposed: Secondary | ICD-10-CM | POA: Diagnosis not present

## 2023-12-31 DIAGNOSIS — L97512 Non-pressure chronic ulcer of other part of right foot with fat layer exposed: Secondary | ICD-10-CM | POA: Diagnosis not present

## 2023-12-31 DIAGNOSIS — I872 Venous insufficiency (chronic) (peripheral): Secondary | ICD-10-CM | POA: Diagnosis not present

## 2023-12-31 DIAGNOSIS — I482 Chronic atrial fibrillation, unspecified: Secondary | ICD-10-CM | POA: Insufficient documentation

## 2023-12-31 DIAGNOSIS — L97812 Non-pressure chronic ulcer of other part of right lower leg with fat layer exposed: Secondary | ICD-10-CM | POA: Diagnosis not present

## 2023-12-31 DIAGNOSIS — I361 Nonrheumatic tricuspid (valve) insufficiency: Secondary | ICD-10-CM | POA: Diagnosis not present

## 2023-12-31 DIAGNOSIS — E039 Hypothyroidism, unspecified: Secondary | ICD-10-CM | POA: Diagnosis not present

## 2023-12-31 DIAGNOSIS — D631 Anemia in chronic kidney disease: Secondary | ICD-10-CM | POA: Insufficient documentation

## 2023-12-31 DIAGNOSIS — I132 Hypertensive heart and chronic kidney disease with heart failure and with stage 5 chronic kidney disease, or end stage renal disease: Secondary | ICD-10-CM | POA: Diagnosis not present

## 2023-12-31 DIAGNOSIS — L97522 Non-pressure chronic ulcer of other part of left foot with fat layer exposed: Secondary | ICD-10-CM | POA: Diagnosis not present

## 2023-12-31 DIAGNOSIS — I5020 Unspecified systolic (congestive) heart failure: Secondary | ICD-10-CM | POA: Diagnosis present

## 2023-12-31 DIAGNOSIS — R58 Hemorrhage, not elsewhere classified: Secondary | ICD-10-CM | POA: Diagnosis not present

## 2023-12-31 DIAGNOSIS — K625 Hemorrhage of anus and rectum: Secondary | ICD-10-CM | POA: Insufficient documentation

## 2023-12-31 DIAGNOSIS — N185 Chronic kidney disease, stage 5: Secondary | ICD-10-CM | POA: Diagnosis not present

## 2023-12-31 DIAGNOSIS — I4819 Other persistent atrial fibrillation: Secondary | ICD-10-CM | POA: Diagnosis present

## 2023-12-31 LAB — CBC WITH DIFFERENTIAL/PLATELET
Abs Immature Granulocytes: 0.03 10*3/uL (ref 0.00–0.07)
Basophils Absolute: 0 10*3/uL (ref 0.0–0.1)
Basophils Relative: 1 %
Eosinophils Absolute: 0.1 10*3/uL (ref 0.0–0.5)
Eosinophils Relative: 2 %
HCT: 26 % — ABNORMAL LOW (ref 39.0–52.0)
Hemoglobin: 8.3 g/dL — ABNORMAL LOW (ref 13.0–17.0)
Immature Granulocytes: 1 %
Lymphocytes Relative: 8 %
Lymphs Abs: 0.4 10*3/uL — ABNORMAL LOW (ref 0.7–4.0)
MCH: 28.4 pg (ref 26.0–34.0)
MCHC: 31.9 g/dL (ref 30.0–36.0)
MCV: 89 fL (ref 80.0–100.0)
Monocytes Absolute: 0.7 10*3/uL (ref 0.1–1.0)
Monocytes Relative: 16 %
Neutro Abs: 3.1 10*3/uL (ref 1.7–7.7)
Neutrophils Relative %: 72 %
Platelets: 219 10*3/uL (ref 150–400)
RBC: 2.92 MIL/uL — ABNORMAL LOW (ref 4.22–5.81)
RDW: 16.1 % — ABNORMAL HIGH (ref 11.5–15.5)
WBC: 4.3 10*3/uL (ref 4.0–10.5)
nRBC: 0 % (ref 0.0–0.2)

## 2023-12-31 LAB — COMPREHENSIVE METABOLIC PANEL
ALT: 31 U/L (ref 0–44)
AST: 34 U/L (ref 15–41)
Albumin: 2.6 g/dL — ABNORMAL LOW (ref 3.5–5.0)
Alkaline Phosphatase: 77 U/L (ref 38–126)
Anion gap: 16 — ABNORMAL HIGH (ref 5–15)
BUN: 159 mg/dL — ABNORMAL HIGH (ref 8–23)
CO2: 20 mmol/L — ABNORMAL LOW (ref 22–32)
Calcium: 7.9 mg/dL — ABNORMAL LOW (ref 8.9–10.3)
Chloride: 108 mmol/L (ref 98–111)
Creatinine, Ser: 8.38 mg/dL — ABNORMAL HIGH (ref 0.61–1.24)
GFR, Estimated: 6 mL/min — ABNORMAL LOW (ref >60)
Glucose, Bld: 103 mg/dL — ABNORMAL HIGH (ref 70–99)
Potassium: 4.4 mmol/L (ref 3.5–5.1)
Sodium: 144 mmol/L (ref 135–145)
Total Bilirubin: 0.8 mg/dL (ref 0.0–1.2)
Total Protein: 6.8 g/dL (ref 6.5–8.1)

## 2023-12-31 LAB — ABO/RH: ABO/RH(D): O POS

## 2023-12-31 LAB — TYPE AND SCREEN
ABO/RH(D): O POS
Antibody Screen: NEGATIVE

## 2023-12-31 LAB — HEMOGLOBIN AND HEMATOCRIT, BLOOD
HCT: 27.4 % — ABNORMAL LOW (ref 39.0–52.0)
Hemoglobin: 8.2 g/dL — ABNORMAL LOW (ref 13.0–17.0)

## 2023-12-31 MED ORDER — SODIUM CHLORIDE 0.9 % IV BOLUS
1000.0000 mL | Freq: Once | INTRAVENOUS | Status: AC
  Administered 2023-12-31: 1000 mL via INTRAVENOUS

## 2023-12-31 MED ORDER — CALCITRIOL 0.25 MCG PO CAPS
0.2500 ug | ORAL_CAPSULE | ORAL | Status: DC
  Administered 2024-01-01 – 2024-01-03 (×2): 0.25 ug via ORAL
  Filled 2023-12-31 (×2): qty 1

## 2023-12-31 MED ORDER — MULTIVITAMINS PO CAPS: 1.0000 | ORAL_CAPSULE | Freq: Every day | ORAL | Status: DC

## 2023-12-31 MED ORDER — ADULT MULTIVITAMIN W/MINERALS CH
1.0000 | ORAL_TABLET | Freq: Every day | ORAL | Status: DC
  Administered 2024-01-01 – 2024-01-03 (×3): 1 via ORAL
  Filled 2023-12-31 (×3): qty 1

## 2023-12-31 MED ORDER — AMIODARONE HCL 200 MG PO TABS
200.0000 mg | ORAL_TABLET | Freq: Every day | ORAL | Status: DC
  Administered 2024-01-01 – 2024-01-03 (×3): 200 mg via ORAL
  Filled 2023-12-31 (×3): qty 1

## 2023-12-31 MED ORDER — ACETAMINOPHEN 650 MG RE SUPP: 650.0000 mg | Freq: Four times a day (QID) | RECTAL | Status: DC | PRN

## 2023-12-31 MED ORDER — SODIUM CHLORIDE 0.9 % IV SOLN: INTRAVENOUS | Status: DC

## 2023-12-31 MED ORDER — SENNOSIDES-DOCUSATE SODIUM 8.6-50 MG PO TABS: 1.0000 | ORAL_TABLET | Freq: Every evening | ORAL | Status: DC | PRN

## 2023-12-31 MED ORDER — ONDANSETRON HCL 4 MG/2ML IJ SOLN: 4.0000 mg | Freq: Four times a day (QID) | INTRAMUSCULAR | Status: DC | PRN

## 2023-12-31 MED ORDER — TORSEMIDE 100 MG PO TABS
100.0000 mg | ORAL_TABLET | Freq: Every day | ORAL | Status: DC
  Filled 2023-12-31: qty 1

## 2023-12-31 MED ORDER — LEVOTHYROXINE SODIUM 100 MCG PO TABS
100.0000 ug | ORAL_TABLET | Freq: Every day | ORAL | Status: DC
  Administered 2024-01-01 – 2024-01-03 (×3): 100 ug via ORAL
  Filled 2023-12-31 (×3): qty 1

## 2023-12-31 MED ORDER — ALUM SULFATE-CA ACETATE EX PACK
1.0000 | PACK | Freq: Three times a day (TID) | CUTANEOUS | Status: DC
  Administered 2024-01-01: 1 via TOPICAL
  Filled 2023-12-31 (×2): qty 1

## 2023-12-31 MED ORDER — ACETAMINOPHEN 325 MG PO TABS: 650.0000 mg | ORAL_TABLET | Freq: Four times a day (QID) | ORAL | Status: DC | PRN

## 2023-12-31 MED ORDER — ONDANSETRON HCL 4 MG PO TABS: 4.0000 mg | ORAL_TABLET | Freq: Four times a day (QID) | ORAL | Status: DC | PRN

## 2023-12-31 NOTE — ED Notes (Signed)
ED Provider made aware of difficult IV start

## 2023-12-31 NOTE — ED Notes (Signed)
Marland Kitchen  ed

## 2023-12-31 NOTE — ED Notes (Signed)
Multiple IV attempts made my EMS provider and this writer with no success

## 2023-12-31 NOTE — Progress Notes (Signed)
Patient has wounds on bilateral lower extremities. Left lower mid leg wound measures 7cm x 5.  Left foot top of foot wound measures 5 1/2cm  x 5cm. Right mid leg wound measures 5cm x 2 1/2cm. Two healed quarter size scabs on the top of the right foot. All wound beds are pink and moist. No odor. No tunneling. Patient states, "home health comes two times per week to change dressings". Xeofoam dressing, gauze and foam applied. Bishnu,RN assisted with dressing change.  Plan of care ongoing.

## 2023-12-31 NOTE — ED Provider Notes (Signed)
  Provider Note MRN:  [id number]  Arrival date & time: 12/31/23    ED Course and Medical Decision Making  Assumed care from Dr Freida Busman at shift change.  See note from prior team for complete details, in brief:  GIB Fatigue/weak  Multiple bloody stools today On doac for afib Gross blood on rectal Labs > admit  Plan per prior physician admit  Clinical Course as of 12/31/23 1759  Thu Dec 31, 2023  1730 Creatinine(!): 8.38 Similar to baseline [SG]    Clinical Course User Index [SG] Sloan Leiter, DO     Secure chat sent to [facility] on call (pt saw dr stark in 2009 and follows with [facility]), they will see in the AM  Admit [facility], (GIB, symptomatic anemia)       .Critical Care  Performed by: Sloan Leiter, DO Authorized by: Sloan Leiter, DO   Critical care provider statement:    Critical care time (minutes):  30   Critical care was necessary to treat or prevent imminent or life-threatening deterioration of the following conditions:  Circulatory failure   Critical care was time spent personally by me on the following activities:  Development of treatment plan with patient or surrogate, discussions with consultants, evaluation of patient's response to treatment, examination of patient, ordering and review of laboratory studies, ordering and review of radiographic studies, ordering and performing treatments and interventions, pulse oximetry, re-evaluation of patient's condition and review of old charts   Care discussed with: admitting provider     Final Clinical Impressions(s) / ED Diagnoses     ICD-10-CM   1. Gastrointestinal hemorrhage, unspecified gastrointestinal hemorrhage type  K92.2     2. Anticoagulated  Z79.01       ED Discharge Orders     None       Discharge Instructions   None        Sloan Leiter, DO 12/31/23 1759

## 2023-12-31 NOTE — H&P (Signed)
History and Physical  Douglas Matthews ZOX:[id number] DOB: [birth year] DOA: 12/31/2023  PCP: [place], [name], MD   Chief Complaint: Bloody stools  HPI: Douglas Matthews is a 89 y.o. male with medical history significant for A-fib on Eliquis, BPH, HFrEF, CKD stage V, HTN and hypothyroidism who presents to the ED for evaluation of bloody stools. Per spouse, patient had bloody stools this afternoon and bright red per rectum.  He has had normal stools until today when she noticed his stools mixed with blood red blood as well as blood on the toilet paper. Patient reports generalized weakness but denies any dizziness, headaches, abdominal pain, rectal pain, shortness of breath, palpitations, dark stools, hematuria, hemoptysis or leg pain.  He also endorsed chronic lower extremity swelling.  ED Course: Vitals are stable.  Labs are significant for Hgb of 8.3 from baseline of 8.0, BUN/creatinine of 159/8.38 from 165/8.69 3 weeks ago.  Patient received IV NS 1 L bolus and started on IV NS 125 cc/h.  GI was consulted for evaluation TRH was consulted for admission  Review of Systems: Please see HPI for pertinent positives and negatives. A complete 10 system review of systems are otherwise negative.  Past Medical History:  Diagnosis Date   Anemia    Atrial fibrillation (HCC)    Atrial flutter (HCC)    BPH (benign prostatic hypertrophy)    Chronic HFrEF (heart failure with reduced ejection fraction) (HCC)    Chronic kidney disease (CKD), active medical management without dialysis, stage 5 (HCC)    Hyperlipidemia    Hypertension    Hypothyroidism    LBBB (left bundle branch block)    Past Surgical History:  Procedure Laterality Date   APPENDECTOMY     CARDIOVERSION N/A 10/29/2022   Procedure: CARDIOVERSION;  Surgeon: Lewayne Bunting, MD;  Location: [facility] ENDOSCOPY;  Service: Cardiovascular;  Laterality: N/A;   CARDIOVERSION N/A 05/21/2023   Procedure: CARDIOVERSION;  Surgeon: [name], MD;   Location: [facility] INVASIVE CV LAB;  Service: Cardiovascular;  Laterality: N/A;   HERNIA REPAIR     TEE WITHOUT CARDIOVERSION N/A 10/29/2022   Procedure: TRANSESOPHAGEAL ECHOCARDIOGRAM (TEE);  Surgeon: Lewayne Bunting, MD;  Location: [facility] ENDOSCOPY;  Service: Cardiovascular;  Laterality: N/A;   Social History:  reports that he has never smoked. He has never used smokeless tobacco. He reports that he does not drink alcohol and does not use drugs.  No Known Allergies  Family History  Problem Relation Age of Onset   Diabetes Son    Heart disease Other      Prior to Admission medications   Medication Sig Start Date End Date Taking? Authorizing Provider  aluminum sulfate-calcium acetate (DOMEBORO) packet Apply 1 packet topically 3 (three) times daily. Mixed it in water. 12/14/23   [place], [name], MD  amiodarone (PACERONE) 200 MG tablet Take 1 tablet (200 mg total) by mouth daily. 09/03/23   Gaston Islam., NP  calcitRIOL (ROCALTROL) 0.25 MCG capsule Take 0.25 mcg by mouth every other day.    [provider]  COD LIVER OIL PO Take 1 tablet by mouth daily.    [provider]  ELIQUIS 2.5 MG TABS tablet TAKE 1 TABLET BY MOUTH TWICE A DAY 11/19/23   Nahser, Deloris Ping, MD  Garlic 1000 MG CAPS Take 1,000 mg by mouth daily.    [provider]  levothyroxine (SYNTHROID) 100 MCG tablet Take 1 tablet (100 mcg total) by mouth daily. 12/15/23   [place], Betty  G, MD  Multiple Vitamin (MULTIVITAMIN) capsule Take 1 capsule by mouth daily.    [provider]  torsemide (DEMADEX) 100 MG tablet Take 1 tablet (100 mg total) by mouth daily. 12/18/23   [place], [name], MD    Physical Exam: BP 110/76 (BP Location: Right Arm)   Pulse 83   Temp (!) 97.4 F (36.3 C) (Oral)   Resp 14   Ht 5\' 8"  (1.727 m)   Wt 62.2 kg   SpO2 100%   BMI 20.86 kg/m  General: Pleasant, chronically ill elderly man laying in bed. No acute distress. HEENT: [place]/AT. Anicteric sclera CV: RRR. No murmurs,  rubs, or gallops. Trace BLE edema. Pulmonary: Lungs CTAB. Normal effort. No wheezing or rales. Abdominal: Soft, nontender, nondistended. Normal bowel sounds. Extremities: Palpable radial and DP pulses. Normal ROM. Skin: Warm and dry. Multiple superficial leg wounds covered with dressing Neuro: A&Ox3. Normal sensation to light touch. No focal deficit. Psych: Normal mood and affect          Labs on Admission:  Basic Metabolic Panel: Recent Labs  Lab 12/31/23 1516  NA 144  K 4.4  CL 108  CO2 20*  GLUCOSE 103*  BUN 159*  CREATININE 8.38*  CALCIUM 7.9*   Liver Function Tests: Recent Labs  Lab 12/31/23 1516  AST 34  ALT 31  ALKPHOS 77  BILITOT 0.8  PROT 6.8  ALBUMIN 2.6*   No results for input(s): "LIPASE", "AMYLASE" in the last 168 hours. No results for input(s): "AMMONIA" in the last 168 hours. CBC: Recent Labs  Lab 12/31/23 1432 12/31/23 2139  WBC 4.3  --   NEUTROABS 3.1  --   HGB 8.3* 8.2*  HCT 26.0* 27.4*  MCV 89.0  --   PLT 219  --    Cardiac Enzymes: No results for input(s): "CKTOTAL", "CKMB", "CKMBINDEX", "TROPONINI" in the last 168 hours. BNP (last 3 results) Recent Labs    01/30/23 0452 02/03/23 1936 12/04/23 1757  BNP 751.6* 878.0* 1,043.7*    ProBNP (last 3 results) Recent Labs    02/03/23 1501  PROBNP 1,760.0*    CBG: No results for input(s): "GLUCAP" in the last 168 hours.  Radiological Exams on Admission: No results found.  EKG shows sinus rhythm with prolonged PR interval and LBBB  Assessment/Plan Douglas Matthews is a 89 y.o. male with medical history significant for A-fib on Eliquis, BPH, HFrEF, CKD stage V, HTN and hypothyroidism who presents to the ED for evaluation of bloody stools and admitted for GI bleed  # GI bleed Patient with history of A-fib on Eliquis presented with 1 day of hematochezia. Hemoglobin of 8.3 remained stable around his baseline of 8. Patient found to have gross blood on rectal exam by ED provider. Last  colonoscopy in 2009 showed diverticulosis and nonbleeding internal hemorrhoids. Presentation likely lower GI bleed from diverticular bleed or internal hemorrhoids. No further bloody stools since admission. Repeat H&H shows stable Hgb at 8.2. -GI consulted, plans to see patient in the morning -Hold Eliquis -N.p.o. at midnight -Trend CBC  # A-fib EKG on admission shows sinus rhythm with slightly prolonged PR interval and LBBB. HR stable in the 80s. -Continue amiodarone -Hold Eliquis in the setting of GI bleed  # HFrEF # Tricuspid regurg Last TTE in March 2024 showed EF 20-25%, LV global hypokinesis, moderate LVH, moderately elevated PASP, moderate MVR and moderate to severe TVR. Status post 1 L IV NS in the ED. Patient euvolemic and in no respiratory  distress but continues to have lower extremity edema. Will hold off on further IV fluids. -Discontinue IV fluids -Continue torsemide -Apply TED hose  # CKD stage V # Anemia of CKD Followed closely by [facility]. Not a candidate for dialysis based on age and comorbidities. Kidney function at his baseline. Hemoglobin also remains at his baseline. -Continue Calcitrol and torsemide -Avoid nephrotoxic agents  # Hypothyroidism TSH 2 weeks ago of 50.62 with free T4 of 0.64. Patient's levothyroxine was increased from 88 mcg to 100 mcg. -Continue on Synthroid -Repeat TSH in 4-6 weeks  # Lower extremity wounds Patient with a few superficial wounds on both legs and left foot currently covered with dressing. -Continue wound care with [place]   DVT prophylaxis: TED hose    Code Status: Limited: Do not attempt resuscitation (DNR) -DNR-LIMITED -Do Not Intubate/DNI   Consults called: GI  Family Communication: Discussed admission with spouse at bedside  Severity of Illness: The appropriate patient status for this patient is INPATIENT. Inpatient status is judged to be reasonable and necessary in order to provide the required intensity of  service to ensure the patient's safety. The patient's presenting symptoms, physical exam findings, and initial radiographic and laboratory data in the context of their chronic comorbidities is felt to place them at high risk for further clinical deterioration. Furthermore, it is not anticipated that the patient will be medically stable for discharge from the hospital within 2 midnights of admission.   * I certify that at the point of admission it is my clinical judgment that the patient will require inpatient hospital care spanning beyond 2 midnights from the point of admission due to high intensity of service, high risk for further deterioration and high frequency of surveillance required.*  Level of care: Med-Surg   Steffanie Rainwater, MD 12/31/2023, 10:24 PM [facility] Pager: [phone] Isaiah 41:10   If 7PM-7AM, please contact night-coverage [email or web address] Password [email or web address]

## 2023-12-31 NOTE — ED Triage Notes (Signed)
BIB EMS for home, Blood in stool with clots with multiple BMs, On Eliquis. Never had this before, general weakness, 100/60-80-98% RA

## 2023-12-31 NOTE — Progress Notes (Signed)
VAST consult received to obtain IV access. Upon entering patient's room, set up to use ultrasound as he reported he had been stuck multiple times. Upon uncovering left arm to assess, noted PIV in left antecubital. Unit staff notified that IV in place upon my arrival, but needs to be charted in IV flowsheets

## 2023-12-31 NOTE — ED Notes (Signed)
Pt provided with a [place] sandwich, apple sauce, and ginger ale.

## 2023-12-31 NOTE — ED Notes (Signed)
ED TO INPATIENT HANDOFF REPORT  ED Nurse Name and Phone #: Diyana Starrett/[phone]  S Name/Age/Gender Douglas Matthews 89 y.o. male Room/Bed: WA22/WA22  Code Status   Code Status: Limited: Do not attempt resuscitation (DNR) -DNR-LIMITED -Do Not Intubate/DNI   Home/SNF/Other Home Patient oriented to: self, place, time, and situation Is this baseline? Yes   Triage Complete: Triage complete  Chief Complaint GI bleed [K92.2]  Triage Note BIB EMS for home, Blood in stool with clots with multiple BMs, On Eliquis. Never had this before, general weakness, 100/60-80-98% RA    Allergies No Known Allergies  Level of Care/Admitting Diagnosis ED Disposition     ED Disposition  Admit   Condition  --   Comment  Hospital [facility]: [facility] [[id number]]  Level of Care: Med-Surg [16]  May admit patient to [facility] or [facility] if equivalent level of care is available:: No  Covid Evaluation: Asymptomatic - no recent exposure (last 10 days) testing not required  Diagnosis: GI bleed [409811]  Admitting Physician: Steffanie Rainwater [[id number]]  Attending Physician: Steffanie Rainwater [[id number]]  Certification:: I certify this patient will need inpatient services for at least 2 midnights  Expected Medical Readiness: 01/02/2024          B Medical/Surgery History Past Medical History:  Diagnosis Date   Anemia    Atrial fibrillation (HCC)    Atrial flutter (HCC)    BPH (benign prostatic hypertrophy)    Chronic HFrEF (heart failure with reduced ejection fraction) (HCC)    Chronic kidney disease (CKD), active medical management without dialysis, stage 5 (HCC)    Hyperlipidemia    Hypertension    Hypothyroidism    LBBB (left bundle branch block)    Past Surgical History:  Procedure Laterality Date   APPENDECTOMY     CARDIOVERSION N/A 10/29/2022   Procedure: CARDIOVERSION;  Surgeon: Lewayne Bunting, MD;  Location: [facility] ENDOSCOPY;  Service: Cardiovascular;   Laterality: N/A;   CARDIOVERSION N/A 05/21/2023   Procedure: CARDIOVERSION;  Surgeon: [name], MD;  Location: [facility] INVASIVE CV LAB;  Service: Cardiovascular;  Laterality: N/A;   HERNIA REPAIR     TEE WITHOUT CARDIOVERSION N/A 10/29/2022   Procedure: TRANSESOPHAGEAL ECHOCARDIOGRAM (TEE);  Surgeon: Lewayne Bunting, MD;  Location: [facility] ENDOSCOPY;  Service: Cardiovascular;  Laterality: N/A;     A IV Location/Drains/Wounds Patient Lines/Drains/Airways Status     Active Line/Drains/Airways     Name Placement date Placement time Site Days   Peripheral IV 12/31/23 20 G Posterior;Right Hand 12/31/23  1430  Hand  less than 1   Peripheral IV 12/31/23 20 G 1" Left Antecubital 12/31/23  1437  Antecubital  less than 1            Intake/Output Last 24 hours  Intake/Output Summary (Last 24 hours) at 12/31/2023 1942 Last data filed at 12/31/2023 1931 Gross per 24 hour  Intake 1000 ml  Output --  Net 1000 ml    Labs/Imaging Results for orders placed or performed during the hospital encounter of 12/31/23 (from the past 48 hours)  CBC with Differential     Status: Abnormal   Collection Time: 12/31/23  2:32 PM  Result Value Ref Range   WBC 4.3 4.0 - 10.5 K/uL   RBC 2.92 (L) 4.22 - 5.81 MIL/uL   Hemoglobin 8.3 (L) 13.0 - 17.0 g/dL   HCT 65.7 (L) 84.6 - 96.2 %   MCV 89.0 80.0 - 100.0 fL   MCH 28.4 26.0 -  34.0 pg   MCHC 31.9 30.0 - 36.0 g/dL   RDW 16.1 (H) 09.6 - 04.5 %   Platelets 219 150 - 400 K/uL   nRBC 0.0 0.0 - 0.2 %   Neutrophils Relative % 72 %   Neutro Abs 3.1 1.7 - 7.7 K/uL   Lymphocytes Relative 8 %   Lymphs Abs 0.4 (L) 0.7 - 4.0 K/uL   Monocytes Relative 16 %   Monocytes Absolute 0.7 0.1 - 1.0 K/uL   Eosinophils Relative 2 %   Eosinophils Absolute 0.1 0.0 - 0.5 K/uL   Basophils Relative 1 %   Basophils Absolute 0.0 0.0 - 0.1 K/uL   Immature Granulocytes 1 %   Abs Immature Granulocytes 0.03 0.00 - 0.07 K/uL    Comment: Performed at [facility],  [address], [place], [place] [address]  ABO/Rh     Status: None   Collection Time: 12/31/23  2:32 PM  Result Value Ref Range   ABO/RH(D)      O POS Performed at [facility], [address] [place], [place] [address]   Comprehensive metabolic panel     Status: Abnormal   Collection Time: 12/31/23  3:16 PM  Result Value Ref Range   Sodium 144 135 - 145 mmol/L   Potassium 4.4 3.5 - 5.1 mmol/L   Chloride 108 98 - 111 mmol/L   CO2 20 (L) 22 - 32 mmol/L   Glucose, Bld 103 (H) 70 - 99 mg/dL    Comment: Glucose reference range applies only to samples taken after fasting for at least 8 hours.   BUN 159 (H) 8 - 23 mg/dL    Comment: RESULT CONFIRMED BY MANUAL DILUTION   Creatinine, Ser 8.38 (H) 0.61 - 1.24 mg/dL   Calcium 7.9 (L) 8.9 - 10.3 mg/dL   Total Protein 6.8 6.5 - 8.1 g/dL   Albumin 2.6 (L) 3.5 - 5.0 g/dL   AST 34 15 - 41 U/L   ALT 31 0 - 44 U/L   Alkaline Phosphatase 77 38 - 126 U/L   Total Bilirubin 0.8 0.0 - 1.2 mg/dL   GFR, Estimated 6 (L) >60 mL/min    Comment: (NOTE) Calculated using the CKD-EPI Creatinine Equation (2021)    Anion gap 16 (H) 5 - 15    Comment: Performed at [facility], [address] [place], [place] [address]  Type and screen Ordered by PROVIDER DEFAULT     Status: None   Collection Time: 12/31/23  5:23 PM  Result Value Ref Range   ABO/RH(D) O POS    Antibody Screen NEG    Sample Expiration      01/03/2024,2359 Performed at [facility], [address] [place], [place] [address]    No results found.  Pending Labs Unresulted Labs (From admission, onward)     Start     Ordered   12/31/23 2200  Hemoglobin and hematocrit, blood  Once,   R        12/31/23 1907            Vitals/Pain Today's Vitals   12/31/23 1337 12/31/23 1656 12/31/23 1846 12/31/23 1934  BP:  107/73  108/70  Pulse:  84  81  Resp:  17  13  Temp:   98.1 F (36.7 C) 97.6 F (36.4 C)  TempSrc:   Oral Oral   SpO2:  100%  100%  Weight: 62.2 kg     Height: 5\' 8"  (1.727  m)     PainSc:    0-No pain    Isolation Precautions No active isolations  Medications Medications  sodium chloride 0.9 % bolus 1,000 mL (0 mLs Intravenous Stopped 12/31/23 1931)    And  0.9 %  sodium chloride infusion ( Intravenous New Bag/Given 12/31/23 1933)  acetaminophen (TYLENOL) tablet 650 mg (has no administration in time range)    Or  acetaminophen (TYLENOL) suppository 650 mg (has no administration in time range)  senna-docusate (Senokot-S) tablet 1 tablet (has no administration in time range)  ondansetron (ZOFRAN) tablet 4 mg (has no administration in time range)    Or  ondansetron (ZOFRAN) injection 4 mg (has no administration in time range)    Mobility walks with cane short distance/walker longer distance      R Report given to:

## 2023-12-31 NOTE — ED Provider Notes (Signed)
[place] EMERGENCY DEPARTMENT AT [facility] Provider Note   CSN: [id number] Arrival date & time: 12/31/23  1323     History  Chief Complaint  Patient presents with   GI Bleeding    Douglas Matthews is a 89 y.o. male.  89 year old male who is on Eliquis secondary to A-fib presents with GI bleeding x 1 day.  Patient states he had several stools which are being bright red blood.  Denies any prior history of GI bleeds.  No reported history of hematemesis.  Endorses increased weakness.  Called EMS and was transported here       Home Medications Prior to Admission medications   Medication Sig Start Date End Date Taking? Authorizing Provider  aluminum sulfate-calcium acetate (DOMEBORO) packet Apply 1 packet topically 3 (three) times daily. Mixed it in water. 12/14/23   [place], [name], MD  amiodarone (PACERONE) 200 MG tablet Take 1 tablet (200 mg total) by mouth daily. 09/03/23   Gaston Islam., NP  calcitRIOL (ROCALTROL) 0.25 MCG capsule Take 0.25 mcg by mouth every other day.    [provider]  COD LIVER OIL PO Take 1 tablet by mouth daily.    [provider]  ELIQUIS 2.5 MG TABS tablet TAKE 1 TABLET BY MOUTH TWICE A DAY 11/19/23   Nahser, Deloris Ping, MD  Garlic 1000 MG CAPS Take 1,000 mg by mouth daily.    [provider]  levothyroxine (SYNTHROID) 100 MCG tablet Take 1 tablet (100 mcg total) by mouth daily. 12/15/23   [place], [name], MD  Multiple Vitamin (MULTIVITAMIN) capsule Take 1 capsule by mouth daily.    [provider]  torsemide (DEMADEX) 100 MG tablet Take 1 tablet (100 mg total) by mouth daily. 12/18/23   [place], [name], MD      Allergies    Patient has no known allergies.    Review of Systems   Review of Systems  All other systems reviewed and are negative.   Physical Exam Updated Vital Signs BP 103/66 (BP Location: Right Arm)   Pulse 84   Temp 97.8 F (36.6 C) (Oral)   Resp 17   Ht 1.727 m (5\' 8" )   Wt  62.2 kg   SpO2 100%   BMI 20.86 kg/m  Physical Exam Vitals and nursing note reviewed. Exam conducted with a chaperone present.  Constitutional:      General: He is not in acute distress.    Appearance: Normal appearance. He is well-developed. He is not toxic-appearing.  HENT:     Head: Normocephalic and atraumatic.  Eyes:     General: Lids are normal.     Conjunctiva/sclera: Conjunctivae normal.     Pupils: Pupils are equal, round, and reactive to light.  Neck:     Thyroid: No thyroid mass.     Trachea: No tracheal deviation.  Cardiovascular:     Rate and Rhythm: Normal rate and regular rhythm.     Heart sounds: Normal heart sounds. No murmur heard.    No gallop.  Pulmonary:     Effort: Pulmonary effort is normal. No respiratory distress.     Breath sounds: Normal breath sounds. No stridor. No decreased breath sounds, wheezing, rhonchi or rales.  Abdominal:     General: There is no distension.     Palpations: Abdomen is soft.     Tenderness: There is no abdominal tenderness. There is no rebound.  Genitourinary:    Comments: Gross blood noted Musculoskeletal:  General: No tenderness. Normal range of motion.     Cervical back: Normal range of motion and neck supple.  Skin:    General: Skin is warm and dry.     Findings: No abrasion or rash.  Neurological:     Mental Status: He is alert and oriented to person, place, and time. Mental status is at baseline.     GCS: GCS eye subscore is 4. GCS verbal subscore is 5. GCS motor subscore is 6.     Cranial Nerves: No cranial nerve deficit.     Sensory: No sensory deficit.     Motor: Motor function is intact.  Psychiatric:        Attention and Perception: Attention normal.        Speech: Speech normal.        Behavior: Behavior normal.     ED Results / Procedures / Treatments   Labs (all labs ordered are listed, but only abnormal results are displayed) Labs Reviewed  COMPREHENSIVE METABOLIC PANEL  CBC WITH  DIFFERENTIAL/PLATELET  TYPE AND SCREEN    EKG None  Radiology No results found.  Procedures Procedures    Medications Ordered in ED Medications  sodium chloride 0.9 % bolus 1,000 mL (has no administration in time range)    And  0.9 %  sodium chloride infusion (has no administration in time range)    ED Course/ Medical Decision Making/ A&P                                 Medical Decision Making Amount and/or Complexity of Data Reviewed Labs: ordered.  Risk Prescription drug management.   Patient here with likely lower GI bleeding.  He is on Eliquis.  Labs are pending at this time.  Signed out to next provider        Final Clinical Impression(s) / ED Diagnoses Final diagnoses:  None    Rx / DC Orders ED Discharge Orders     None         Lorre Nick, MD 12/31/23 1549

## 2024-01-01 ENCOUNTER — Telehealth: Payer: Self-pay

## 2024-01-01 DIAGNOSIS — D649 Anemia, unspecified: Secondary | ICD-10-CM | POA: Diagnosis not present

## 2024-01-01 DIAGNOSIS — N185 Chronic kidney disease, stage 5: Secondary | ICD-10-CM | POA: Diagnosis not present

## 2024-01-01 DIAGNOSIS — Z7901 Long term (current) use of anticoagulants: Secondary | ICD-10-CM

## 2024-01-01 DIAGNOSIS — K625 Hemorrhage of anus and rectum: Secondary | ICD-10-CM | POA: Diagnosis not present

## 2024-01-01 DIAGNOSIS — I482 Chronic atrial fibrillation, unspecified: Secondary | ICD-10-CM | POA: Diagnosis not present

## 2024-01-01 DIAGNOSIS — Z7189 Other specified counseling: Secondary | ICD-10-CM

## 2024-01-01 DIAGNOSIS — K922 Gastrointestinal hemorrhage, unspecified: Secondary | ICD-10-CM | POA: Diagnosis not present

## 2024-01-01 DIAGNOSIS — Z515 Encounter for palliative care: Secondary | ICD-10-CM

## 2024-01-01 DIAGNOSIS — I5020 Unspecified systolic (congestive) heart failure: Secondary | ICD-10-CM | POA: Diagnosis not present

## 2024-01-01 DIAGNOSIS — R4589 Other symptoms and signs involving emotional state: Secondary | ICD-10-CM

## 2024-01-01 DIAGNOSIS — K648 Other hemorrhoids: Secondary | ICD-10-CM | POA: Diagnosis not present

## 2024-01-01 LAB — VITAMIN B12: Vitamin B-12: 636 pg/mL (ref 180–914)

## 2024-01-01 LAB — CBC
HCT: 26.2 % — ABNORMAL LOW (ref 39.0–52.0)
Hemoglobin: 8.2 g/dL — ABNORMAL LOW (ref 13.0–17.0)
MCH: 28.4 pg (ref 26.0–34.0)
MCHC: 31.3 g/dL (ref 30.0–36.0)
MCV: 90.7 fL (ref 80.0–100.0)
Platelets: 218 10*3/uL (ref 150–400)
RBC: 2.89 MIL/uL — ABNORMAL LOW (ref 4.22–5.81)
RDW: 16.1 % — ABNORMAL HIGH (ref 11.5–15.5)
WBC: 4.4 10*3/uL (ref 4.0–10.5)
nRBC: 0 % (ref 0.0–0.2)

## 2024-01-01 MED ORDER — POLYETHYLENE GLYCOL 3350 17 G PO PACK
17.0000 g | PACK | Freq: Every day | ORAL | Status: DC
  Administered 2024-01-01 – 2024-01-03 (×3): 17 g via ORAL
  Filled 2024-01-01 (×3): qty 1

## 2024-01-01 MED ORDER — HYDROCORTISONE ACETATE 25 MG RE SUPP
25.0000 mg | Freq: Two times a day (BID) | RECTAL | Status: AC
  Administered 2024-01-01 – 2024-01-02 (×4): 25 mg via RECTAL
  Filled 2024-01-01 (×4): qty 1

## 2024-01-01 NOTE — Care Management Obs Status (Signed)
MEDICARE OBSERVATION STATUS NOTIFICATION   Patient Details  Name: NICOLAUS ANDEL MRN: [id number] Date of Birth: 1935-03-15   Medicare Observation Status Notification Given:       Howell Rucks, RN 01/01/2024, 3:38 PM

## 2024-01-01 NOTE — Consult Note (Signed)
Consultation Note Date: 01/01/2024   Patient Name: Douglas Matthews  DOB: 1935-11-24  MRN: [id number]  Age / Sex: 89 y.o., male   PCP: [place], [name], MD Referring Physician: David Stall, Darin Engels, MD  Reason for Consultation: Establishing goals of care     Chief Complaint/History of Present Illness:   Patient is an 89 year old male with a past medical history of chronic atrial fibrillation on Eliquis, BPH, HFrEF 20-25%, and CKD stage V not a candidate for dialysis who was admitted on 12/31/2023 for management of red blood per rectum that started the day of admission.  During this admission patient has received management for GI bleed.  GI contacted who did not recommend any procedures at this time due to patient's medical comorbidities.  Continuing to monitor repeat GI bleed at this time.  Concern GI bleed was secondary to hemorrhoids versus AVM versus diverticulosis versus colon polyp.  Palliative medicine team consulted to assist with complex medical decision making. Of note patient is known to palliative medicine team from prior admissions.  Extensive review of EMR prior to presenting to bedside.  When presenting to bedside, patient laying comfortably in the bed.  Patient is wife at bedside.  Introduced myself as a member of the palliative medicine team and my role in patient's medical journey.  Spent time learning about patient's current medical illness.  Patient and wife noted they are well versed with palliative medicine since patient is followed by [facility] palliative medicine.  Wife noted that she knows Monica from [place] can be called if she needs it.  Wife also explained that she has been told differences between palliative care and hospice.  She has been previously informed multiple times regarding the seriousness of patient's stage V kidney disease.  Patient hoping to continue working with physical therapy through [facility] at home and regain some strength.  Wife supporting of  this.  Did discuss bowel regimen moving forward with the patient.  Patient was not taking any medications at home for constipation though this was an issue.  Discussed medications of MiraLAX and senna and dosing for constipation management and daily bowel movements.  Patient notes that he is currently feeling better without any symptom concerns besides needing to have a bowel movement hopefully soon without any blood.  He is also feeling hungry and would like his diet to move from just liquids so noted would reach out to GI regarding this.  Patient and wife hopeful patient can continue to improve and go home when medically appropriate.  Spent time answering questions as able.  Provided emotional support via active listening.  All questions answered at that time.  Noted palliative medicine team would be available if needed.  Updated IDT regarding discussion including hospitalist, RN, GI, and ACC liaison.  Primary Diagnoses  Present on Admission:  GI bleed  Heart failure with reduced ejection fraction (HCC)  Persistent atrial fibrillation (HCC)  Atrial fibrillation, chronic (HCC)  CKD (chronic kidney disease), stage V (HCC)  RVF (right ventricular failure) (HCC)   Past Medical History:  Diagnosis Date   Anemia    Atrial fibrillation (HCC)    Atrial flutter (HCC)    BPH (benign prostatic hypertrophy)    Chronic HFrEF (heart failure with reduced ejection fraction) (HCC)    Chronic kidney disease (CKD), active medical management without dialysis, stage 5 (HCC)    Hyperlipidemia    Hypertension    Hypothyroidism    LBBB (left bundle branch block)    Social History  Socioeconomic History   Marital status: Married    Spouse name: Not on file   Number of children: Not on file   Years of education: Not on file   Highest education level: Not on file  Occupational History   Occupation: Retired    Comment: [employer]  Tobacco Use   Smoking status: Never   Smokeless tobacco: Never  Vaping  Use   Vaping status: Never Used  Substance and Sexual Activity   Alcohol use: No   Drug use: No   Sexual activity: Not on file  Other Topics Concern   Not on file  Social History Narrative   Married x 57 years    Right handed    Social Drivers of Health   Financial Resource Strain: Low Risk  (07/08/2022)   Overall Financial Resource Strain (CARDIA)    Difficulty of Paying Living Expenses: Not hard at all  Food Insecurity: No Food Insecurity (12/31/2023)   Hunger Vital Sign    Worried About Running Out of Food in the Last Year: Never true    Ran Out of Food in the Last Year: Never true  Transportation Needs: No Transportation Needs (12/31/2023)   PRAPARE - Administrator, Civil Service (Medical): No    Lack of Transportation (Non-Medical): No  Physical Activity: Insufficiently Active (07/08/2022)   Exercise Vital Sign    Days of Exercise per Week: 2 days    Minutes of Exercise per Session: 30 min  Stress: No Stress Concern Present (07/08/2022)   [employer] of Occupational Health - Occupational Stress Questionnaire    Feeling of Stress : Not at all  Social Connections: Moderately Integrated (12/31/2023)   Social Connection and Isolation Panel [NHANES]    Frequency of Communication with Friends and Family: Three times a week    Frequency of Social Gatherings with Friends and Family: Three times a week    Attends Religious Services: More than 4 times per year    Active Member of Clubs or Organizations: No    Attends Banker Meetings: Never    Marital Status: Married   Family History  Problem Relation Age of Onset   Diabetes Son    Heart disease Other    Scheduled Meds:  amiodarone  200 mg Oral Daily   calcitRIOL  0.25 mcg Oral QODAY   hydrocortisone  25 mg Rectal BID   levothyroxine  100 mcg Oral Q0600   multivitamin with minerals  1 tablet Oral Daily   Continuous Infusions: PRN Meds:.acetaminophen **OR** acetaminophen, ondansetron **OR**  ondansetron (ZOFRAN) IV, senna-docusate No Known Allergies CBC:    Component Value Date/Time   WBC 4.4 01/01/2024 1010   HGB 8.2 (L) 01/01/2024 1010   HGB 10.8 (L) 06/17/2023 1426   HCT 26.2 (L) 01/01/2024 1010   HCT 33.4 (L) 06/17/2023 1426   PLT 218 01/01/2024 1010   PLT 176 06/17/2023 1426   MCV 90.7 01/01/2024 1010   MCV 90 06/17/2023 1426   NEUTROABS 3.1 12/31/2023 1432   NEUTROABS 2.50 02/25/2023 0000   LYMPHSABS 0.4 (L) 12/31/2023 1432   MONOABS 0.7 12/31/2023 1432   EOSABS 0.1 12/31/2023 1432   BASOSABS 0.0 12/31/2023 1432   Comprehensive Metabolic Panel:    Component Value Date/Time   NA 144 12/31/2023 1516   NA 139 09/30/2023 0000   K 4.4 12/31/2023 1516   CL 108 12/31/2023 1516   CO2 20 (L) 12/31/2023 1516   BUN 159 (H) 12/31/2023 1516   BUN  155 (A) 09/30/2023 0000   CREATININE 8.38 (H) 12/31/2023 1516   CREATININE 2.37 (H) 06/13/2020 1346   GLUCOSE 103 (H) 12/31/2023 1516   CALCIUM 7.9 (L) 12/31/2023 1516   AST 34 12/31/2023 1516   ALT 31 12/31/2023 1516   ALKPHOS 77 12/31/2023 1516   BILITOT 0.8 12/31/2023 1516   BILITOT 0.5 06/17/2023 1426   PROT 6.8 12/31/2023 1516   PROT 7.0 06/17/2023 1426   ALBUMIN 2.6 (L) 12/31/2023 1516   ALBUMIN 3.9 06/17/2023 1426    Physical Exam: Vital Signs: BP 113/78 (BP Location: Right Arm)   Pulse 92   Temp 97.6 F (36.4 C) (Oral)   Resp 16   Ht 5\' 8"  (1.727 m)   Wt 62.2 kg   SpO2 100%   BMI 20.86 kg/m  SpO2: SpO2: 100 % O2 Device: O2 Device: Room Air O2 Flow Rate:   Intake/output summary:  Intake/Output Summary (Last 24 hours) at 01/01/2024 1335 Last data filed at 01/01/2024 1000 Gross per 24 hour  Intake 1300 ml  Output 600 ml  Net 700 ml   LBM: Last BM Date : 12/31/23 Baseline Weight: Weight: 62.2 kg Most recent weight: Weight: 62.2 kg  General: NAD, alert, pleasant, chronically ill-appearing Respiratory: no increased work of breathing noted, not in respiratory distress Neuro: awake,  interactive Psych: appropriately answers all questions          Palliative Performance Scale: 40%              Additional Data Reviewed: Recent Labs    12/31/23 1432 12/31/23 1516 12/31/23 2139 01/01/24 1010  WBC 4.3  --   --  4.4  HGB 8.3*  --  8.2* 8.2*  PLT 219  --   --  218  NA  --  144  --   --   BUN  --  159*  --   --   CREATININE  --  8.38*  --   --     Imaging: DG Knee Complete 4 Views Right CLINICAL DATA:  Bilateral knee pain, swelling and abrasions following a fall on 11/19/2023.  EXAM: RIGHT KNEE - COMPLETE 4+ VIEW  COMPARISON:  None Available.  FINDINGS: Medial and lateral meniscal calcifications. Mild tricompartmental degenerative spur formation. Moderate-sized effusion. No fracture or dislocation seen. Extensive atheromatous arterial calcifications.  IMPRESSION: 1. Moderate-sized effusion. 2. No fracture. 3. Mild tricompartmental degenerative changes. 4. Chondrocalcinosis.  Electronically Signed   By: Beckie Salts M.D.   On: 12/04/2023 20:21 DG Knee Complete 4 Views Left CLINICAL DATA:  Bilateral knee swelling and abrasions following a fall on 11/19/2023.  EXAM: LEFT KNEE - COMPLETE 4+ VIEW  COMPARISON:  None Available.  FINDINGS: Medial and lateral meniscal calcifications. Mild-to-moderate medial joint space narrowing. Mild-to-moderate medial and patellofemoral spur formation and mild lateral spur formation. Moderate-sized effusion. No fracture or dislocation seen. Extensive atheromatous arterial calcifications.  IMPRESSION: 1. Moderate-sized effusion. 2. No fracture. 3. Mild-to-moderate degenerative changes. 4. Medial and lateral meniscal chondrocalcinosis.  Electronically Signed   By: Beckie Salts M.D.   On: 12/04/2023 20:20 DG Chest 2 View CLINICAL DATA:  Bilateral leg swelling.  History of heart failure.  EXAM: CHEST - 2 VIEW  COMPARISON:  06/18/2023  FINDINGS: Stable enlarged cardiac silhouette. Clear lungs with  normal vascularity. Stable mild chronic interstitial prominence at the lung bases. Stable mild peribronchial thickening and mild hyperexpansion of the lungs. Stable small amount of right pleural thickening. Mild thoracic spine degenerative changes and scoliosis.  IMPRESSION: 1.  No acute abnormality. 2. Stable cardiomegaly. 3. Stable mild changes of COPD chronic bronchitis.  Electronically Signed   By: Beckie Salts M.D.   On: 12/04/2023 20:18    I personally reviewed recent imaging.   Palliative Care Assessment and Plan Summary of Established Goals of Care and Medical Treatment Preferences   Patient is an 89 year old male with a past medical history of chronic atrial fibrillation on Eliquis, BPH, HFrEF 20-25%, and CKD stage V not a candidate for dialysis who was admitted on 12/31/2023 for management of red blood per rectum that started the day of admission.  During this admission patient has received management for GI bleed.  GI contacted who did not recommend any procedures at this time due to patient's medical comorbidities.  Continuing to monitor repeat GI bleed at this time.  Concern GI bleed was secondary to hemorrhoids versus AVM versus diverticulosis versus colon polyp.  Palliative medicine team consulted to assist with complex medical decision making. Of note patient is known to palliative medicine team from prior admissions.  # Complex medical decision making/goals of care  -Discussed care with patient while his wife is at bedside as detailed above in HPI.  At this time patient and wife hoping to continue with appropriate medical interventions in the setting of patient being DNR/DNI.  Patient wants to work to get stronger and wife agreeing with patient coming home with [facility] home health support for PT/OT.  Wife and patient's state they already have home palliative services through [facility] and know to reach out to them if needed.  They are not ready to transition to hospice  focused care at this time.  Noted palliative medicine team will be available if needed.  -  Code Status: Limited: Do not attempt resuscitation (DNR) -DNR-LIMITED -Do Not Intubate/DNI    # Symptom management Patient is receiving these palliative interventions for symptom management with an intent to improve quality of life.   -Constipation Discussed with patient and wife need for bowel regimen moving forward to prevent constipation.  Recommended use of MiraLAX 17 g daily and senna 1 tab twice daily.  Noted would inform GI of recommendation so can be started when appropriate from their perspective.  # Psycho-social/Spiritual Support:  - Support System: Wife  # Discharge Planning:  Home with Palliative Services  Thank you for allowing the palliative care team to participate in the care [name].  Alvester Morin, DO Palliative Care Provider PMT # [phone]  If patient remains symptomatic despite maximum doses, please call PMT at [phone] between 0700 and 1900. Outside of these hours, please call attending, as PMT does not have night coverage.  Personally spent 63 minutes in patient care including extensive chart review (labs, imaging, progress/consult notes, vital signs), medically appropraite exam, discussed with treatment team, education to patient, family, and staff, documenting clinical information, medication review and management, coordination of care, and available advanced directive documents.

## 2024-01-01 NOTE — Consult Note (Signed)
WOC Nurse Consult Note: patient with longstanding ulcers to B lower legs; wife states these started as blisters when legs were edematous and have stayed like this; patient has recently begun having home health nurse come out twice weekly as ordered by primary MD  Reason for Consult: B lower leg wounds  Wound type: full thickness L foot, L lower leg and R lower leg r/t ? Edema vs venous insufficiency  Pressure Injury POA: NA  Measurement: 1.  L foot 5 cm x 4 cm x 0.1 cm 100% pink moist, superior to this 2 areas 1 cm x 1 cm 100% dry eschar; L anterior lower leg 7 cm x 3 cm x 0.1 cm 50% pink moist 50% yellow  2.  R lower anterior leg 4 cm x 3 cm x 0.1 cm 75% pink moist 25% yellow; 2 small areas (0.5 cm x 0.5 cm)  of eschar noted to R foot  Wound bed:as above  Drainage (amount, consistency, odor) minimal serosanguinous  Periwound: intact, wife states leg edema is the best it has looked in weeks  Dressing procedure/placement/frequency: Clean wounds to L dorsal foot, L anterior leg and R anterior leg with Vashe wound cleanser Hart Rochester [phone]), apply Xeroform gauze to wound beds daily, cover with dry gauze and silicone foam to R leg wound.  Secure dressings on left leg with Kerlix roll gauze beginning just above toes and ending right below knees.    WOC performed above wound care at time of visit.  Discussed POC with patient and bedside nurse. WOC team will not follow. Patient should resume home health care for ongoing management of these wounds at discharge.   Thank you,    Priscella Mann MSN, RN-BC, [employer] [phone]

## 2024-01-01 NOTE — Telephone Encounter (Signed)
Copied from CRM [id number]. Topic: Clinical - Medical Advice >> Jan 01, 2024  9:27 AM Ferdie Ping wrote: Reason for CRM: Joni Reining from [facility] called to let Dr. [place] know she saw Douglas Matthews for PT and he reported 10/10 pain over the last week with both feet with weight bearing. Wanted to let you know in case you would like to follow up on that. If you have any questions for Joni Reining, callback number is [phone]

## 2024-01-01 NOTE — Plan of Care (Signed)

## 2024-01-01 NOTE — Progress Notes (Addendum)
[facility] PROGRESS NOTE    Progress Note  STAR CHEESE  WUJ:[id number] DOB: 06/16/1935 DOA: 12/31/2023 PCP: [place], [name], MD     Brief Narrative:   Douglas Matthews is an 89 y.o. male past medical history significant for chronic atrial fibrillation on Eliquis, BPH, chronic diastolic heart failure, chronic kidney disease stage V who is not a candidate for dialysis hypothyroidism comes into the ED for bright red blood per rectum that started on the day of admission he has been asymptomatic.  Hemoglobin is around 8, BUN 159 creatinine 8.3 patient started on IV fluids in the ED, GI consulted for evaluation Eliquis was held.   Assessment/Plan:   Bright red blood per rectum/acute blood loss anemia/GI bleed: 1 day history of hematochezia, Eliquis was held. Gross blood on rectal exam by the ED. Last colonoscopy 2009 showed diverticulosis and nonbleeding hemorrhoids. Question if this is a diverticular bleed. GI was consulted at night awaiting further recommendations. N.p.o., continue to hold Eliquis.  KVO IV fluids. Not a candidate for further [facility] for a.fib. CBC every 12 hours to monitor hemoglobin. Consult palliative.  Permanent atrial fibrillation: Continue amiodarone hold Eliquis. Not a candidate for [facility].  Chronic heart failure with reduced ejection fraction : Last 2D echo showed an EF of 20% back in March 2024.  IV fluids were held. Hold torsemide. Place TED hose. He could not tolerate GDMT on previous admissions.  Chronic kidney disease stage V/anemia of chronic renal disease: Not a candidate for dialysis based on his age and comorbidities as per renal on last admission. Continue calcitriol hold torsemide.  Hypothyroidism: ASH 2 weeks ago was 50.2, patient on Synthroid 100 mcg need to check TSH and free T4 in 4 weeks as an outpatient.  Lower extremity wounds: They appear superficial continue wound care.   DVT prophylaxis: scd Family  Communication:none Status is: Inpatient Remains inpatient appropriate because: Acute GI bleed likely diverticular    Code Status:     Code Status Orders  (From admission, onward)           Start     Ordered   12/31/23 1856  Do not attempt resuscitation (DNR)- Limited -Do Not Intubate (DNI)  Continuous       Question Answer Comment  If pulseless and not breathing No CPR or chest compressions.   In Pre-Arrest Conditions (Patient Is Breathing and Has A Pulse) Do not intubate. Provide all appropriate non-invasive medical interventions. Avoid ICU transfer unless indicated or required.   Consent: Discussion documented in EHR or advanced directives reviewed      12/31/23 1856           Code Status History     Date Active Date Inactive Code Status Order ID Comments User Context   02/06/2023 0918 02/07/2023 1738 DNR [id number]  Ernie Avena, NP Inpatient   02/04/2023 0853 02/06/2023 0918 Full Code [id number]  Clydie Braun, MD ED   01/28/2023 0414 01/30/2023 2306 Full Code [id number]  Gery Pray, MD Inpatient   10/25/2022 0441 10/30/2022 1805 Full Code [id number]  John Giovanni, MD Inpatient         IV Access:   Peripheral IV   Procedures and diagnostic studies:   No results found.   Medical Consultants:   None.   Subjective:    RODELL MARRS no complaints.  Objective:    Vitals:   12/31/23 1934 12/31/23 2109 01/01/24 0215 01/01/24 0513  BP: 108/70 110/76 122/78 122/78  Pulse: 81 83  95 95  Resp: 13 14 16 16   Temp: 97.6 F (36.4 C) (!) 97.4 F (36.3 C) 97.6 F (36.4 C) (!) 97.5 F (36.4 C)  TempSrc: Oral Oral Oral Oral  SpO2: 100% 100% 100% 100%  Weight:      Height:       SpO2: 100 %   Intake/Output Summary (Last 24 hours) at 01/01/2024 0656 Last data filed at 01/01/2024 0600 Gross per 24 hour  Intake 1300 ml  Output 450 ml  Net 850 ml   Filed Weights   12/31/23 1337  Weight: 62.2 kg    Exam: General exam: In no acute  distress. Respiratory system: Good air movement and clear to auscultation. Cardiovascular system: S1 & S2 heard, RRR. No JVD. Gastrointestinal system: Abdomen is nondistended, soft and nontender.  Extremities: No pedal edema. Skin: No rashes, lesions or ulcers Psychiatry: Judgement and insight appear normal. Mood & affect appropriate.    Data Reviewed:    Labs: Basic Metabolic Panel: Recent Labs  Lab 12/31/23 1516  NA 144  K 4.4  CL 108  CO2 20*  GLUCOSE 103*  BUN 159*  CREATININE 8.38*  CALCIUM 7.9*   GFR Estimated Creatinine Clearance: 5.6 mL/min (A) (by C-G formula based on SCr of 8.38 mg/dL (H)). Liver Function Tests: Recent Labs  Lab 12/31/23 1516  AST 34  ALT 31  ALKPHOS 77  BILITOT 0.8  PROT 6.8  ALBUMIN 2.6*   No results for input(s): "LIPASE", "AMYLASE" in the last 168 hours. No results for input(s): "AMMONIA" in the last 168 hours. Coagulation profile No results for input(s): "INR", "PROTIME" in the last 168 hours. COVID-19 Labs  No results for input(s): "DDIMER", "FERRITIN", "LDH", "CRP" in the last 72 hours.  Lab Results  Component Value Date   SARSCOV2NAA NEGATIVE 01/27/2023   SARSCOV2NAA NEGATIVE 10/24/2022    CBC: Recent Labs  Lab 12/31/23 1432 12/31/23 2139  WBC 4.3  --   NEUTROABS 3.1  --   HGB 8.3* 8.2*  HCT 26.0* 27.4*  MCV 89.0  --   PLT 219  --    Cardiac Enzymes: No results for input(s): "CKTOTAL", "CKMB", "CKMBINDEX", "TROPONINI" in the last 168 hours. BNP (last 3 results) Recent Labs    02/03/23 1501  PROBNP 1,760.0*   CBG: No results for input(s): "GLUCAP" in the last 168 hours. D-Dimer: No results for input(s): "DDIMER" in the last 72 hours. Hgb A1c: No results for input(s): "HGBA1C" in the last 72 hours. Lipid Profile: No results for input(s): "CHOL", "HDL", "LDLCALC", "TRIG", "CHOLHDL", "LDLDIRECT" in the last 72 hours. Thyroid function studies: No results for input(s): "TSH", "T4TOTAL", "T3FREE",  "THYROIDAB" in the last 72 hours.  Invalid input(s): "FREET3" Anemia work up: No results for input(s): "VITAMINB12", "FOLATE", "FERRITIN", "TIBC", "IRON", "RETICCTPCT" in the last 72 hours. Sepsis Labs: Recent Labs  Lab 12/31/23 1432  WBC 4.3   Microbiology No results found for this or any previous visit (from the past 240 hours).   Medications:    aluminum sulfate-calcium acetate  1 packet Topical TID   amiodarone  200 mg Oral Daily   calcitRIOL  0.25 mcg Oral QODAY   levothyroxine  100 mcg Oral Q0600   multivitamin with minerals  1 tablet Oral Daily   Continuous Infusions:    LOS: 1 day   Marinda Elk  [facility]  01/01/2024, 6:56 AM

## 2024-01-01 NOTE — TOC Initial Note (Addendum)
Transition of Care [facility]) - Initial/Assessment Note    Patient Details  Name: Douglas Matthews MRN: [id number] Date of Birth: 08-03-1935  Transition of Care [facility]) CM/SW Contact:    Howell Rucks, RN Phone Number: 01/01/2024, 12:14 PM  Clinical Narrative:  Met with pt and spouse at bedside to introduce role of TOC/NCM and review for dc planning, pt/spouse confirms pt has an established PCP and pharmacy, home DME: cane, walker, BSC, spouse reports pt currently receiving [facility] RN/HHA through [facility]- rep w/[employer] notified patient in hospital, pt reports he feels safe returning home with his spouse, confirmed transportation available at discharge. TOC will continue to follow.           -3:46pm Code 44/MOON completed,          Expected Discharge Plan: Home w Home Health Services Barriers to Discharge: Continued Medical Work up   Patient Goals and CMS Choice Patient states their goals for this hospitalization and ongoing recovery are:: return home          Expected Discharge Plan and Services       Living arrangements for the past 2 months: Single Family Home                                      Prior Living Arrangements/Services Living arrangements for the past 2 months: Single Family Home Lives with:: Spouse Patient language and need for interpreter reviewed:: Yes Do you feel safe going back to the place where you live?: Yes      Need for Family Participation in Patient Care: Yes (Comment) Care giver support system in place?: Yes (comment) Current home services: DME, Home RN (walker, cane, potty chair, on service with [facility] for RN wound care and HHA) Criminal Activity/Legal Involvement Pertinent to Current Situation/Hospitalization: No - Comment as needed  Activities of Daily Living   ADL Screening (condition at time of admission) Independently performs ADLs?: Yes (appropriate for developmental age) Is the patient deaf or have difficulty hearing?:  No Does the patient have difficulty seeing, even when wearing glasses/contacts?: No  Permission Sought/Granted                  Emotional Assessment Appearance:: Appears stated age Attitude/Demeanor/Rapport: Gracious Affect (typically observed): Accepting Orientation: : Oriented to Self, Oriented to Place, Oriented to  Time, Oriented to Situation Alcohol / Substance Use: Not Applicable Psych Involvement: No (comment)  Admission diagnosis:  GI bleed [K92.2] Anticoagulated [Z79.01] Gastrointestinal hemorrhage, unspecified gastrointestinal hemorrhage type [K92.2] Patient Active Problem List   Diagnosis Date Noted   GI bleed 12/31/2023   RVF (right ventricular failure) (HCC) 12/25/2023   Bilateral lower extremity edema 12/14/2023   Routine general medical examination at a health care facility 06/16/2023   Hypothyroidism 05/28/2023   CKD (chronic kidney disease), stage V (HCC) 04/06/2023   Prediabetes 04/06/2023   Persistent atrial fibrillation (HCC) 02/04/2023   Chronic anticoagulation 02/04/2023   AKI (acute kidney injury) (HCC) 01/28/2023   Atrial fibrillation, chronic (HCC) 01/28/2023   Heart failure with reduced ejection fraction (HCC) 01/28/2023   Metabolic acidosis 01/28/2023   Nausea 01/28/2023   CAP (community acquired pneumonia) 10/25/2022   Atrial fibrillation with RVR (HCC) 10/24/2022   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   Hypertensive retinopathy of both eyes 06/04/2020   Elevated alkaline phosphatase level 05/18/2020   Diastolic dysfunction 08/18/2016   Acute kidney injury superimposed  on chronic kidney disease (HCC) 04/22/2016   Hypertensive kidney disease with chronic kidney disease stage III (HCC) 04/22/2016   HLD (hyperlipidemia) 04/22/2016   Normocytic anemia 07/20/2007   HTN (hypertension) 07/20/2007   Diverticulosis of colon 07/20/2007   BPH associated with nocturia 07/20/2007   PCP:  [place], [name], MD Pharmacy:   [facility] [address], [place] - [address] [place] [address] Phone: [phone] Fax: [phone]     Social Drivers of Health (SDOH) Social History: SDOH Screenings   Food Insecurity: No Food Insecurity (12/31/2023)  Housing: Low Risk  (12/31/2023)  Transportation Needs: No Transportation Needs (12/31/2023)  Utilities: Not At Risk (12/31/2023)  Depression (PHQ2-9): Low Risk  (08/07/2023)  Financial Resource Strain: Low Risk  (07/08/2022)  Physical Activity: Insufficiently Active (07/08/2022)  Social Connections: Moderately Integrated (12/31/2023)  Stress: No Stress Concern Present (07/08/2022)  Tobacco Use: Low Risk  (12/31/2023)   SDOH Interventions:     Readmission Risk Interventions    01/01/2024   12:12 PM  Readmission Risk Prevention Plan  Transportation Screening Complete  PCP or Specialist Appt within 5-7 Days Complete  Home Care Screening Complete  Medication Review (RN CM) Complete

## 2024-01-01 NOTE — Care Management CC44 (Signed)
Condition Code 44 Documentation Completed  Patient Details  Name: LINNIE MCGLOCKLIN MRN: [id number] Date of Birth: 1935-08-09   Condition Code 44 given:  Yes Patient signature on Condition Code 44 notice:  Yes Documentation of 2 MD's agreement:  Yes Code 44 added to claim:  Yes    Howell Rucks, RN 01/01/2024, 3:38 PM

## 2024-01-01 NOTE — Consult Note (Addendum)
Consultation  Referring Provider:   [facility] Primary Care Physician:  [place], [name], MD Primary Gastroenterologist:  Dr. Russella Dar       Reason for Consultation:    GI bleed  DOA: 12/31/2023         Hospital Day: 2         HPI:   Douglas Matthews is a 89 y.o. male with past medical history significant for hypertension, hyperlipidemia, BPH, hypothyroidism, CKD stage V not a candidate for dialysis, A-fib/a flutter status post cardioversion 05/21/2023 on Eliquis 2.5 mg twice daily since 10/2022, HFrEF echocardiogram 01/2023 EF 2025% moderate pulmonary artery systolic pressure RVSP 50 moderate MR, diverticulosis, hemorrhoids.  Presents to the ER with bright red blood per rectum  asymptomatic anemia  Work up notable for  Hgb 8.3, MCV 89, repeat hemoglobin this morning stable at 8.2 Baseline hemoglobin 10 to 12 months ago was between 11 and 12, concern about 6 months ago patient had decline in hemoglobin with baseline being 7-9 in the last 3 months, he is has not received any blood products outpatient that I can see 02/25/2023 iron 58, ferritin 133, saturations 15 TIBC 386 12/07/2023 iron 42,  saturations 12, ferritin 74 Normal platelets, no leukocytosis BUN 159, creatinine 8.38, potassium 4.4, calcium 7.9 Albumin 2.6 AST 34, ALT 31, alk phos 77 total bili 0.8  Patient previously known to Dr. Russella Dar with remote history of colonoscopy 2009 that showed diverticulosis cecum to sigmoid colon, internal small nonbleeding hemorrhoids, good prep.  No family was present at the time of my evaluation. But his wife, Douglas Matthews, called while I was in the room and I was able to talk with her as well.  Patient states he has never had GI bleeding like this before, no family history of colon cancer or GI cancer that he knows of. Normally patient has a bowel movement once daily brown soft has not had any changes. Then this morning patient had 1 episode with a bowel movement of bright red blood in the toilet per his  wife thinks it was about a half a cup.  Patient had no associated abdominal pain, nausea, vomiting.  Denies rectal pain.   States his appetite has been okay. Patient denies any dizziness, shortness of breath during the episode but he has had some increased shortness of breath with exertion of the past 6 months no chest pain. Denies GERD, nausea vomiting, dysphagia, melena. States his weight is up and down but denies orthopnea or PND.  Patient denies NSAID use, history of alcohol, drug use, smoking history. Patient states approximately 6 months ago he might of gotten 1 injection or IV with nephrology otherwise it does not sound like he has had any iron infusions or EPO injections.  Abnormal ED labs: Abnormal Labs Reviewed  CBC WITH DIFFERENTIAL/PLATELET - Abnormal; Notable for the following components:      Result Value   RBC 2.92 (*)    Hemoglobin 8.3 (*)    HCT 26.0 (*)    RDW 16.1 (*)    Lymphs Abs 0.4 (*)    All other components within normal limits  COMPREHENSIVE METABOLIC PANEL - Abnormal; Notable for the following components:   CO2 20 (*)    Glucose, Bld 103 (*)    BUN 159 (*)    Creatinine, Ser 8.38 (*)    Calcium 7.9 (*)    Albumin 2.6 (*)    GFR, Estimated 6 (*)    Anion gap 16 (*)  All other components within normal limits  HEMOGLOBIN AND HEMATOCRIT, BLOOD - Abnormal; Notable for the following components:   Hemoglobin 8.2 (*)    HCT 27.4 (*)    All other components within normal limits    Past Medical History:  Diagnosis Date   Anemia    Atrial fibrillation (HCC)    Atrial flutter (HCC)    BPH (benign prostatic hypertrophy)    Chronic HFrEF (heart failure with reduced ejection fraction) (HCC)    Chronic kidney disease (CKD), active medical management without dialysis, stage 5 (HCC)    Hyperlipidemia    Hypertension    Hypothyroidism    LBBB (left bundle branch block)     Surgical History:  He  has a past surgical history that includes Appendectomy; Hernia  repair; TEE without cardioversion (N/A, 10/29/2022); Cardioversion (N/A, 10/29/2022); and Cardioversion (N/A, 05/21/2023). Family History:  His family history includes Diabetes in his son; Heart disease in an other family member. Social History:   reports that he has never smoked. He has never used smokeless tobacco. He reports that he does not drink alcohol and does not use drugs.  Prior to Admission medications   Medication Sig Start Date End Date Taking? Authorizing Provider  aluminum sulfate-calcium acetate (DOMEBORO) packet Apply 1 packet topically 3 (three) times daily. Mixed it in water. 12/14/23   [place], [name], MD  amiodarone (PACERONE) 200 MG tablet Take 1 tablet (200 mg total) by mouth daily. 09/03/23   Gaston Islam., NP  calcitRIOL (ROCALTROL) 0.25 MCG capsule Take 0.25 mcg by mouth every other day.    [provider]  COD LIVER OIL PO Take 1 tablet by mouth daily.    [provider]  ELIQUIS 2.5 MG TABS tablet TAKE 1 TABLET BY MOUTH TWICE A DAY 11/19/23   Nahser, Deloris Ping, MD  Garlic 1000 MG CAPS Take 1,000 mg by mouth daily.    [provider]  levothyroxine (SYNTHROID) 100 MCG tablet Take 1 tablet (100 mcg total) by mouth daily. 12/15/23   [place], [name], MD  Multiple Vitamin (MULTIVITAMIN) capsule Take 1 capsule by mouth daily.    [provider]  torsemide (DEMADEX) 100 MG tablet Take 1 tablet (100 mg total) by mouth daily. 12/18/23   [place], [name], MD    Current Facility-Administered Medications  Medication Dose Route Frequency Provider Last Rate Last Admin   acetaminophen (TYLENOL) tablet 650 mg  650 mg Oral Q6H PRN Steffanie Rainwater, MD       Or   acetaminophen (TYLENOL) suppository 650 mg  650 mg Rectal Q6H PRN Steffanie Rainwater, MD       aluminum sulfate-calcium acetate (DOMEBORO) packet 1 packet  1 packet Topical TID Steffanie Rainwater, MD       amiodarone (PACERONE) tablet 200 mg  200 mg Oral Daily Steffanie Rainwater, MD        calcitRIOL (ROCALTROL) capsule 0.25 mcg  0.25 mcg Oral Graciella Freer, MD       levothyroxine (SYNTHROID) tablet 100 mcg  100 mcg Oral [id number] Steffanie Rainwater, MD   100 mcg at 01/01/24 0502   multivitamin with minerals tablet 1 tablet  1 tablet Oral Daily Steffanie Rainwater, MD       ondansetron [facility]) tablet 4 mg  4 mg Oral Q6H PRN Steffanie Rainwater, MD       Or   ondansetron [facility]) injection 4 mg  4 mg Intravenous Q6H PRN  Steffanie Rainwater, MD       senna-docusate (Senokot-S) tablet 1 tablet  1 tablet Oral QHS PRN Steffanie Rainwater, MD        Allergies as of 12/31/2023   (No Known Allergies)    Review of Systems:    Constitutional: No weight loss, fever, chills, weakness or fatigue HEENT: Eyes: No change in vision               Ears, Nose, Throat:  No change in hearing or congestion Skin: No rash or itching Cardiovascular: No chest pain, chest pressure or palpitations   Respiratory: No SOB or cough Gastrointestinal: See HPI and otherwise negative Genitourinary: No dysuria or change in urinary frequency Neurological: No headache, dizziness or syncope Musculoskeletal: No new muscle or joint pain Hematologic: No bleeding or bruising Psychiatric: No history of depression or anxiety     Physical Exam:  Vital signs in last 24 hours: Temp:  [97.4 F (36.3 C)-98.1 F (36.7 C)] 97.5 F (36.4 C) (01/31 0513) Pulse Rate:  [81-95] 95 (01/31 0513) Resp:  [13-17] 16 (01/31 0513) BP: (103-122)/(66-78) 122/78 (01/31 0513) SpO2:  [100 %] 100 % (01/31 0513) Weight:  [62.2 kg] 62.2 kg (01/30 1337) Last BM Date : 12/31/23 Last BM recorded by nurses in past 5 days No data recorded  General:   Thin, chronically ill-appearing male in no acute distress Head:  Normocephalic and atraumatic. Eyes: sclerae anicteric,conjunctive pink  Heart: Regular rate and rhythm, holosystolic murmur, distant heart sounds Pulm: Clear anteriorly; no wheezing Abdomen:  Soft,  Non-distended AB, Active bowel sounds. No tenderness . Without guarding and Without rebound, No organomegaly appreciated. Extremities:  Without edema. Rectal: Normal external rectal exam, normal rectal tone, appreciated internal hemorrhoids, non-tender, no masses, gross blood Msk:  Symmetrical without gross deformities. Peripheral pulses intact.  Neurologic:  Alert and  oriented x4;  No focal deficits.  Skin:   Dry and intact without significant lesions or rashes. Psychiatric:  Cooperative. Normal mood and affect.  LAB RESULTS: Recent Labs    12/31/23 1432 12/31/23 2139  WBC 4.3  --   HGB 8.3* 8.2*  HCT 26.0* 27.4*  PLT 219  --    BMET Recent Labs    12/31/23 1516  NA 144  K 4.4  CL 108  CO2 20*  GLUCOSE 103*  BUN 159*  CREATININE 8.38*  CALCIUM 7.9*   LFT Recent Labs    12/31/23 1516  PROT 6.8  ALBUMIN 2.6*  AST 34  ALT 31  ALKPHOS 77  BILITOT 0.8   PT/INR No results for input(s): "LABPROT", "INR" in the last 72 hours.  STUDIES: No results found.    Impression    Bright red blood per rectum with a symptomatic anemia HD stable, gross BRB on rectal exam last colonoscopy 2009 with diverticulosis and internal hemorrhoids likely multifactorial with CKD stage V,  anemia of chronic disease, heart failure patient has had steady drop in hemoglobin over the last 6 months, started on Eliquis 2.5 mg twice daily for A-fib November 2023  raising some concern for GI bleed, most likely diverticular versus AVMs worsened with Eliquis possible malignancy, patient has stable HD, no signs of UGI  CKD stage V BUN 159 Cr 8.38  GFR 6  Potassium 4.4  Magnesium 2.4  Not a candidate for dialysis Consider EPO  HFeHF echo March 2024 action fraction 20 to 25% BNP 1043 on 1/3, baseline with CKD appears to be 800   atrial fibrillation  status post cardioversion  June on Eliquis 2.5 mg since 10/2022  Pulmonary hypertension/COPD Moderate RSVP 50  moderate protein calorie  malnutrition Albumin 12/31/2023  2.6  BMI body mass index is 20.86 kg/m.  Secondary to ESRD - RD consult -Count calories   Principal Problem:   GI bleed Active Problems:   Atrial fibrillation, chronic (HCC)   Heart failure with reduced ejection fraction (HCC)   Persistent atrial fibrillation (HCC)   CKD (chronic kidney disease), stage V (HCC)   RVF (right ventricular failure) (HCC)    LOS: 1 day     Plan   89 year old male with multiple comorbid conditions including stage V CKD not candidate for dialysis secondary to age, heart failure ejection fraction 20 to 25%, pulmonary hypertension with COPD presents with 1 episode of bright red blood per rectum.  Patient is on Eliquis for his atrial fibrillation, started November 2023, since that time patient's had a slow decrease in his hemoglobin being baseline 11-13 down to 10 and most recently in the last 3 months has been closer to 8-9. Hgb 8.3 on admission MCV normal at 80 9 repeat hemoglobin stable 8.2 this morning.  12/07/2023 iron low at 42 saturations 12 ferritin 74. Rectal exam shows gross bright red blood, patient did have appreciated internal hemorrhoids on exam no masses appreciated. Most likely this represents AVM, diverticulosis, potential internal hemorrhoids but with drift in hemoglobin while on anticoagulation does raise question of GI bleed, cannot rule out malignancy.  Patient is extremely high risk for endoscopic evaluation I believe this would be more risk than benefit.  I discussed this with the patient and he agrees. -Will start hydrocortisone suppositories for possible internal hemorrhoids -Continue supportive care with possible IV iron consider EPO for his end-stage CKD -In 89 year old male with drifting hemoglobin on Eliquis may need discuss with cardiology discontinuing and that risk versus benefit -Patient with multiple medical comorbidities no plan for endoscopic evaluation, consider palliative care.  Thank you for  your kind consultation, we will continue to follow.   Doree Albee  01/01/2024, 7:42 AM

## 2024-01-02 DIAGNOSIS — D649 Anemia, unspecified: Secondary | ICD-10-CM | POA: Diagnosis not present

## 2024-01-02 DIAGNOSIS — K648 Other hemorrhoids: Secondary | ICD-10-CM

## 2024-01-02 DIAGNOSIS — K625 Hemorrhage of anus and rectum: Secondary | ICD-10-CM

## 2024-01-02 DIAGNOSIS — D631 Anemia in chronic kidney disease: Secondary | ICD-10-CM

## 2024-01-02 DIAGNOSIS — K922 Gastrointestinal hemorrhage, unspecified: Secondary | ICD-10-CM | POA: Diagnosis not present

## 2024-01-02 LAB — CBC
HCT: 26.8 % — ABNORMAL LOW (ref 39.0–52.0)
HCT: 25.6 % — ABNORMAL LOW (ref 39.0–52.0)
Hemoglobin: 8.2 g/dL — ABNORMAL LOW (ref 13.0–17.0)
Hemoglobin: 8.1 g/dL — ABNORMAL LOW (ref 13.0–17.0)
MCH: 27.4 pg (ref 26.0–34.0)
MCH: 28.4 pg (ref 26.0–34.0)
MCHC: 30.6 g/dL (ref 30.0–36.0)
MCHC: 31.6 g/dL (ref 30.0–36.0)
MCV: 89.6 fL (ref 80.0–100.0)
MCV: 89.8 fL (ref 80.0–100.0)
Platelets: 238 10*3/uL (ref 150–400)
Platelets: 249 10*3/uL (ref 150–400)
RBC: 2.99 MIL/uL — ABNORMAL LOW (ref 4.22–5.81)
RBC: 2.85 MIL/uL — ABNORMAL LOW (ref 4.22–5.81)
RDW: 16.2 % — ABNORMAL HIGH (ref 11.5–15.5)
RDW: 16.3 % — ABNORMAL HIGH (ref 11.5–15.5)
WBC: 4.8 10*3/uL (ref 4.0–10.5)
WBC: 4.8 10*3/uL (ref 4.0–10.5)
nRBC: 0 % (ref 0.0–0.2)
nRBC: 0 % (ref 0.0–0.2)

## 2024-01-02 NOTE — Progress Notes (Signed)
[facility] PROGRESS NOTE    Progress Note  Douglas Matthews  ZOX:[id number] DOB: 09/08/1935 DOA: 12/31/2023 PCP: [place], [name], MD     Brief Narrative:   Douglas Matthews is an 89 y.o. male past medical history significant for chronic atrial fibrillation on Eliquis, BPH, chronic diastolic heart failure, chronic kidney disease stage V who is not a candidate for dialysis hypothyroidism comes into the ED for bright red blood per rectum that started on the day of admission he has been asymptomatic.  Hemoglobin is around 8, BUN 159 creatinine 8.3 patient started on IV fluids in the ED, GI consulted for evaluation Eliquis was held.   Assessment/Plan:   Bright red blood per rectum/acute blood loss anemia/GI bleed: Continue to hold Eliquis. GI was consulted recommended conservative management no longer a candidate for Eliquis.  No indication for intervention. Not a candidate for further [facility] for a.fib. Hemoglobin has remained relatively stable. Consult physical therapy.  Permanent atrial fibrillation: Rate controlled on amiodarone discontinue Eliquis. Not a candidate for [facility].  Chronic heart failure with reduced ejection fraction : Last 2D echo showed an EF of 20% back in March 2024.  IV fluids were held. Hold torsemide. Place TED hose. He could not tolerate GDMT on previous admissions.  Chronic kidney disease stage V/anemia of chronic renal disease: Not a candidate for dialysis based on his age and comorbidities as per renal on last admission. Continue calcitriol hold torsemide.  Hypothyroidism: ASH 2 weeks ago was 50.2, patient on Synthroid 100 mcg need to check TSH and free T4 in 4 weeks as an outpatient.  Lower extremity wounds: They appear superficial continue wound care.   DVT prophylaxis: scd Family Communication:none Status is: Inpatient Remains inpatient appropriate because: Acute GI bleed likely diverticular    Code Status:     Code Status Orders  (From  admission, onward)           Start     Ordered   12/31/23 1856  Do not attempt resuscitation (DNR)- Limited -Do Not Intubate (DNI)  Continuous       Question Answer Comment  If pulseless and not breathing No CPR or chest compressions.   In Pre-Arrest Conditions (Patient Is Breathing and Has A Pulse) Do not intubate. Provide all appropriate non-invasive medical interventions. Avoid ICU transfer unless indicated or required.   Consent: Discussion documented in EHR or advanced directives reviewed      12/31/23 1856           Code Status History     Date Active Date Inactive Code Status Order ID Comments User Context   02/06/2023 0918 02/07/2023 1738 DNR [id number]  Ernie Avena, NP Inpatient   02/04/2023 0853 02/06/2023 0918 Full Code [id number]  Clydie Braun, MD ED   01/28/2023 0414 01/30/2023 2306 Full Code [id number]  Gery Pray, MD Inpatient   10/25/2022 0441 10/30/2022 1805 Full Code [id number]  John Giovanni, MD Inpatient         IV Access:   Peripheral IV   Procedures and diagnostic studies:   No results found.   Medical Consultants:   None.   Subjective:    Douglas Matthews no complaints  Objective:    Vitals:   01/01/24 0909 01/01/24 1327 01/01/24 2108 01/02/24 0635  BP: 110/76 113/78 112/74 114/71  Pulse: 90 92 83 91  Resp:   19 18  Temp: 97.6 F (36.4 C) (!) 97.5 F (36.4 C) 97.6 F (36.4 C) 97.7 F (36.5  C)  TempSrc: Oral Oral Oral Oral  SpO2: 100% 100% 99%   Weight:      Height:       SpO2: 99 %   Intake/Output Summary (Last 24 hours) at 01/02/2024 0928 Last data filed at 01/01/2024 2147 Gross per 24 hour  Intake 840 ml  Output 850 ml  Net -10 ml   Filed Weights   12/31/23 1337  Weight: 62.2 kg    Exam: General exam: In no acute distress. Respiratory system: Good air movement and clear to auscultation. Cardiovascular system: S1 & S2 heard, RRR. No JVD. Gastrointestinal system: Abdomen is nondistended, soft and  nontender.  Extremities: No pedal edema. Skin: No rashes, lesions or ulcers Psychiatry: Judgement and insight appear normal. Mood & affect appropriate.  Data Reviewed:    Labs: Basic Metabolic Panel: Recent Labs  Lab 12/31/23 1516  NA 144  K 4.4  CL 108  CO2 20*  GLUCOSE 103*  BUN 159*  CREATININE 8.38*  CALCIUM 7.9*   GFR Estimated Creatinine Clearance: 5.6 mL/min (A) (by C-G formula based on SCr of 8.38 mg/dL (H)). Liver Function Tests: Recent Labs  Lab 12/31/23 1516  AST 34  ALT 31  ALKPHOS 77  BILITOT 0.8  PROT 6.8  ALBUMIN 2.6*   No results for input(s): "LIPASE", "AMYLASE" in the last 168 hours. No results for input(s): "AMMONIA" in the last 168 hours. Coagulation profile No results for input(s): "INR", "PROTIME" in the last 168 hours. COVID-19 Labs  No results for input(s): "DDIMER", "FERRITIN", "LDH", "CRP" in the last 72 hours.  Lab Results  Component Value Date   SARSCOV2NAA NEGATIVE 01/27/2023   SARSCOV2NAA NEGATIVE 10/24/2022    CBC: Recent Labs  Lab 12/31/23 1432 12/31/23 2139 01/01/24 1010 01/02/24 0238  WBC 4.3  --  4.4 4.8  NEUTROABS 3.1  --   --   --   HGB 8.3* 8.2* 8.2* 8.2*  HCT 26.0* 27.4* 26.2* 26.8*  MCV 89.0  --  90.7 89.6  PLT 219  --  218 238   Cardiac Enzymes: No results for input(s): "CKTOTAL", "CKMB", "CKMBINDEX", "TROPONINI" in the last 168 hours. BNP (last 3 results) Recent Labs    02/03/23 1501  PROBNP 1,760.0*   CBG: No results for input(s): "GLUCAP" in the last 168 hours. D-Dimer: No results for input(s): "DDIMER" in the last 72 hours. Hgb A1c: No results for input(s): "HGBA1C" in the last 72 hours. Lipid Profile: No results for input(s): "CHOL", "HDL", "LDLCALC", "TRIG", "CHOLHDL", "LDLDIRECT" in the last 72 hours. Thyroid function studies: No results for input(s): "TSH", "T4TOTAL", "T3FREE", "THYROIDAB" in the last 72 hours.  Invalid input(s): "FREET3" Anemia work up: Recent Labs    01/01/24 0958   VITAMINB12 636   Sepsis Labs: Recent Labs  Lab 12/31/23 1432 01/01/24 1010 01/02/24 0238  WBC 4.3 4.4 4.8   Microbiology No results found for this or any previous visit (from the past 240 hours).   Medications:    amiodarone  200 mg Oral Daily   calcitRIOL  0.25 mcg Oral QODAY   hydrocortisone  25 mg Rectal BID   levothyroxine  100 mcg Oral Q0600   multivitamin with minerals  1 tablet Oral Daily   polyethylene glycol  17 g Oral Daily   Continuous Infusions:    LOS: 1 day   Marinda Elk  [facility]  01/02/2024, 9:28 AM

## 2024-01-02 NOTE — Plan of Care (Signed)

## 2024-01-02 NOTE — Progress Notes (Signed)
Pt stated "Night shift RN changed dressing as ordered"... I was aware she changed and pt was bathed at handoff.

## 2024-01-02 NOTE — Plan of Care (Signed)
  Problem: Education: Goal: Knowledge of General Education information will improve Description: Including pain rating scale, medication(s)/side effects and non-pharmacologic comfort measures Outcome: Progressing   Problem: Health Behavior/Discharge Planning: Goal: Ability to manage health-related needs will improve Outcome: Progressing   Problem: Clinical Measurements: Goal: Ability to maintain clinical measurements within normal limits will improve Outcome: Progressing   Problem: Activity: Goal: Risk for activity intolerance will decrease Outcome: Progressing   Problem: Nutrition: Goal: Adequate nutrition will be maintained Outcome: Progressing   Problem: Coping: Goal: Level of anxiety will decrease Outcome: Progressing   Problem: Elimination: Goal: Will not experience complications related to bowel motility Outcome: Progressing Goal: Will not experience complications related to urinary retention Outcome: Progressing   Problem: Safety: Goal: Ability to remain free from injury will improve Outcome: Progressing   Problem: Skin Integrity: Goal: Risk for impaired skin integrity will decrease Outcome: Progressing   

## 2024-01-02 NOTE — Progress Notes (Signed)
HISTORY OF PRESENT ILLNESS:  Douglas Matthews is a 89 y.o. male with multiple significant medical problems who was seen in consultation yesterday by the GI team regarding minor rectal bleeding.  Felt to be secondary to hemorrhoids based on physical exam.  Being treated with suppositories.  He is sitting in a chair today without complaints.  Just had a bowel movement with a streak of blood.  Wife in room.  LABORATORIES: Hemoglobin 8.2.  No change  REVIEW OF SYSTEMS:  All non-GI ROS negative. Past Medical History:  Diagnosis Date   Anemia    Atrial fibrillation (HCC)    Atrial flutter (HCC)    BPH (benign prostatic hypertrophy)    Chronic HFrEF (heart failure with reduced ejection fraction) (HCC)    Chronic kidney disease (CKD), active medical management without dialysis, stage 5 (HCC)    Hyperlipidemia    Hypertension    Hypothyroidism    LBBB (left bundle branch block)     Past Surgical History:  Procedure Laterality Date   APPENDECTOMY     CARDIOVERSION N/A 10/29/2022   Procedure: CARDIOVERSION;  Surgeon: Lewayne Bunting, MD;  Location: [facility] ENDOSCOPY;  Service: Cardiovascular;  Laterality: N/A;   CARDIOVERSION N/A 05/21/2023   Procedure: CARDIOVERSION;  Surgeon: [name], MD;  Location: [facility] INVASIVE CV LAB;  Service: Cardiovascular;  Laterality: N/A;   HERNIA REPAIR     TEE WITHOUT CARDIOVERSION N/A 10/29/2022   Procedure: TRANSESOPHAGEAL ECHOCARDIOGRAM (TEE);  Surgeon: Lewayne Bunting, MD;  Location: [facility] ENDOSCOPY;  Service: Cardiovascular;  Laterality: N/A;    Social History [name]  reports that he has never smoked. He has never used smokeless tobacco. He reports that he does not drink alcohol and does not use drugs.  family history includes Diabetes in his son; Heart disease in an other family member.  No Known Allergies     PHYSICAL EXAMINATION: Vital signs: BP 114/71 (BP Location: Right Arm)   Pulse 91   Temp 97.7 F (36.5 C) (Oral)   Resp 18    Ht 5\' 8"  (1.727 m)   Wt 62.2 kg   SpO2 99%   BMI 20.86 kg/m   Constitutional: Elderly male, thin but generally well-appearing, no acute distress Psychiatric: alert and oriented x3, cooperative Eyes: extraocular movements intact, anicteric, conjunctiva pink, bilateral cataracts Mouth: oral pharynx moist, no lesions Neck: supple no lymphadenopathy Cardiovascular: heart regular rate and rhythm, no murmur Lungs: clear to auscultation bilaterally Abdomen: soft, nontender, nondistended, no obvious ascites, no peritoneal signs, normal bowel sounds, no organomegaly Rectal: Omitted Extremities: no lower extremity edema bilaterally Skin: no relevant lesions on visible extremities Neuro: No focal deficits.   ASSESSMENT:  1.  Minor rectal bleeding.  Felt secondary to hemorrhoids.  Hemoglobin stable. 2.  Chronic anemia.  Unchanged from baseline 3.  Multiple significant medical problems and advanced age   PLAN:  1.  Continue medicated suppositories 2.  Continue to monitor stools and blood counts 3.  Diet of choice 4.  Would recommend keeping at least 1 additional day to be assured that he remains stable.  Discussed with the patient and his wife.  Will sign off, but please contact us if significant bleeding occurs.  No routine outpatient GI follow-up is required.  Thanks  Wilhemina Bonito. Eda Keys., M.D. [facility] Division of Gastroenterology

## 2024-01-02 NOTE — Progress Notes (Signed)
Mobility Specialist - Progress Note   01/02/24 0958  Mobility  Activity Transferred from bed to chair  Level of Assistance Contact guard assist, steadying assist  Assistive Device Front wheel walker  Distance Ambulated (ft) 2 ft  Activity Response Tolerated well  Mobility Referral Yes  Mobility visit 1 Mobility  Mobility Specialist Start Time (ACUTE ONLY) [id number]  Mobility Specialist Stop Time (ACUTE ONLY) 0958  Mobility Specialist Time Calculation (min) (ACUTE ONLY) 15 min   Pt received in bed and agreeable to transfer to the recliner. Pt was minA from STS. No complaints during transfer. Pt to recliner after session with all needs met.    [facility]

## 2024-01-03 DIAGNOSIS — K922 Gastrointestinal hemorrhage, unspecified: Secondary | ICD-10-CM | POA: Diagnosis not present

## 2024-01-03 DIAGNOSIS — Z7901 Long term (current) use of anticoagulants: Secondary | ICD-10-CM

## 2024-01-03 LAB — CBC
HCT: 26.5 % — ABNORMAL LOW (ref 39.0–52.0)
Hemoglobin: 8.2 g/dL — ABNORMAL LOW (ref 13.0–17.0)
MCH: 27.6 pg (ref 26.0–34.0)
MCHC: 30.9 g/dL (ref 30.0–36.0)
MCV: 89.2 fL (ref 80.0–100.0)
Platelets: 235 10*3/uL (ref 150–400)
RBC: 2.97 MIL/uL — ABNORMAL LOW (ref 4.22–5.81)
RDW: 16.2 % — ABNORMAL HIGH (ref 11.5–15.5)
WBC: 5.1 10*3/uL (ref 4.0–10.5)
nRBC: 0 % (ref 0.0–0.2)

## 2024-01-03 NOTE — Discharge Summary (Signed)
Physician Discharge Summary  Douglas Matthews:[id number] DOB: 27-Nov-1935 DOA: 12/31/2023  PCP: [place], [name], MD  Admit date: 12/31/2023 Discharge date: 01/03/2024  Admitted From: Home Disposition:  Home  Recommendations for Outpatient Follow-up:  Follow up with PCP in 1-2 weeks Please obtain BMP/CBC in one week   Home Health:No Equipment/Devices:None  Discharge Condition:Stable CODE STATUS:DNR Diet recommendation: Heart Healthy   Brief/Interim Summary: 89 y.o. male past medical history significant for chronic atrial fibrillation on Eliquis, BPH, chronic diastolic heart failure, chronic kidney disease stage V who is not a candidate for dialysis hypothyroidism comes into the ED for bright red blood per rectum that started on the day of admission he has been asymptomatic.  Hemoglobin is around 8, BUN 159 creatinine 8.3 patient started on IV fluids in the ED, GI consulted for evaluation Eliquis was held.   Discharge Diagnoses:  Principal Problem:   GI bleed Active Problems:   Heart failure with reduced ejection fraction (HCC)   Persistent atrial fibrillation (HCC)   Atrial fibrillation, chronic (HCC)   CKD (chronic kidney disease), stage V (HCC)   RVF (right ventricular failure) (HCC)   Rectal bleeding   Anemia   Need for emotional support   Counseling and coordination of care   Goals of care, counseling/discussion   Anticoagulated   Palliative care encounter   Bleeding internal hemorrhoids   Anemia in stage 5 chronic kidney disease, not on chronic dialysis (HCC)   Bright red blood per rectum/acute blood loss anemia/GI bleed: GI was consulted recommended conservative management. Eliquis was discontinued he was not a candidate for intervention. He hemoglobin has remained relatively stable.  Permanent atrial fibrillation: Rate controlled amiodarone. He is no longer a candidate for anticoagulation Eliquis was discontinued.  Chronic heart failure with reduced  EF: Resume home regimen no changes made.  Chronic kidney disease stage V: Anemia of chronic disease: Had a candidate for dialysis, resume calcitriol and torsemide as an outpatient.  Hypothyroidism: Need to recheck a TSH and free T4 as an outpatient. His Synthroid dose was recently increased.  On previous admission.  Lower extremity wound: Noted.  Discharge Instructions  Discharge Instructions     Diet - low sodium heart healthy   Complete by: As directed    Increase activity slowly   Complete by: As directed    No wound care   Complete by: As directed       Allergies as of 01/03/2024   No Known Allergies      Medication List     STOP taking these medications    Eliquis 2.5 MG Tabs tablet Generic drug: apixaban       TAKE these medications    aluminum sulfate-calcium acetate packet Commonly known as: DOMEBORO Apply 1 packet topically 3 (three) times daily. Mixed it in water. What changed:  when to take this reasons to take this additional instructions   amiodarone 200 MG tablet Commonly known as: PACERONE Take 1 tablet (200 mg total) by mouth daily.   calcitRIOL 0.25 MCG capsule Commonly known as: ROCALTROL Take 0.25 mcg by mouth every other day.   COD LIVER OIL PO Take 1 tablet by mouth daily.   Garlic 1000 MG Caps Take 1,000 mg by mouth daily.   levothyroxine 100 MCG tablet Commonly known as: SYNTHROID Take 1 tablet (100 mcg total) by mouth daily.   multivitamin capsule Take 1 capsule by mouth daily.   torsemide 100 MG tablet Commonly known as: DEMADEX Take 1 tablet (100 mg  total) by mouth daily.        No Known Allergies  Consultations: GI   Procedures/Studies: DG Knee Complete 4 Views Right Result Date: 12/04/2023 CLINICAL DATA:  Bilateral knee pain, swelling and abrasions following a fall on 11/19/2023. EXAM: RIGHT KNEE - COMPLETE 4+ VIEW COMPARISON:  None Available. FINDINGS: Medial and lateral meniscal calcifications. Mild  tricompartmental degenerative spur formation. Moderate-sized effusion. No fracture or dislocation seen. Extensive atheromatous arterial calcifications. IMPRESSION: 1. Moderate-sized effusion. 2. No fracture. 3. Mild tricompartmental degenerative changes. 4. Chondrocalcinosis. Electronically Signed   By: Beckie Salts M.D.   On: 12/04/2023 20:21   DG Knee Complete 4 Views Left Result Date: 12/04/2023 CLINICAL DATA:  Bilateral knee swelling and abrasions following a fall on 11/19/2023. EXAM: LEFT KNEE - COMPLETE 4+ VIEW COMPARISON:  None Available. FINDINGS: Medial and lateral meniscal calcifications. Mild-to-moderate medial joint space narrowing. Mild-to-moderate medial and patellofemoral spur formation and mild lateral spur formation. Moderate-sized effusion. No fracture or dislocation seen. Extensive atheromatous arterial calcifications. IMPRESSION: 1. Moderate-sized effusion. 2. No fracture. 3. Mild-to-moderate degenerative changes. 4. Medial and lateral meniscal chondrocalcinosis. Electronically Signed   By: Beckie Salts M.D.   On: 12/04/2023 20:20   DG Chest 2 View Result Date: 12/04/2023 CLINICAL DATA:  Bilateral leg swelling.  History of heart failure. EXAM: CHEST - 2 VIEW COMPARISON:  06/18/2023 FINDINGS: Stable enlarged cardiac silhouette. Clear lungs with normal vascularity. Stable mild chronic interstitial prominence at the lung bases. Stable mild peribronchial thickening and mild hyperexpansion of the lungs. Stable small amount of right pleural thickening. Mild thoracic spine degenerative changes and scoliosis. IMPRESSION: 1. No acute abnormality. 2. Stable cardiomegaly. 3. Stable mild changes of COPD chronic bronchitis. Electronically Signed   By: Beckie Salts M.D.   On: 12/04/2023 20:18   (Echo, Carotid, EGD, Colonoscopy, ERCP)    Subjective: No complaints  Discharge Exam: Vitals:   01/02/24 2110 01/03/24 0542  BP: 108/70 117/70  Pulse: 81 85  Resp: 19 19  Temp: (!) 97.5 F (36.4 C)  (!) 97.5 F (36.4 C)  SpO2: 100% 99%   Vitals:   01/02/24 0635 01/02/24 1332 01/02/24 2110 01/03/24 0542  BP: 114/71 111/74 108/70 117/70  Pulse: 91 86 81 85  Resp: 18 15 19 19   Temp: 97.7 F (36.5 C) 97.9 F (36.6 C) (!) 97.5 F (36.4 C) (!) 97.5 F (36.4 C)  TempSrc: Oral Oral Oral Oral  SpO2:  100% 100% 99%  Weight:      Height:        General: Pt is alert, awake, not in acute distress Cardiovascular: RRR, S1/S2 +, no rubs, no gallops Respiratory: CTA bilaterally, no wheezing, no rhonchi Abdominal: Soft, NT, ND, bowel sounds + Extremities: no edema, no cyanosis    The results of significant diagnostics from this hospitalization (including imaging, microbiology, ancillary and laboratory) are listed below for reference.     Microbiology: No results found for this or any previous visit (from the past 240 hours).   Labs: BNP (last 3 results) Recent Labs    01/30/23 0452 02/03/23 1936 12/04/23 1757  BNP 751.6* 878.0* 1,043.7*   Basic Metabolic Panel: Recent Labs  Lab 12/31/23 1516  NA 144  K 4.4  CL 108  CO2 20*  GLUCOSE 103*  BUN 159*  CREATININE 8.38*  CALCIUM 7.9*   Liver Function Tests: Recent Labs  Lab 12/31/23 1516  AST 34  ALT 31  ALKPHOS 77  BILITOT 0.8  PROT 6.8  ALBUMIN 2.6*  No results for input(s): "LIPASE", "AMYLASE" in the last 168 hours. No results for input(s): "AMMONIA" in the last 168 hours. CBC: Recent Labs  Lab 12/31/23 1432 12/31/23 2139 01/01/24 1010 01/02/24 0238 01/02/24 1434 01/03/24 0242  WBC 4.3  --  4.4 4.8 4.8 5.1  NEUTROABS 3.1  --   --   --   --   --   HGB 8.3* 8.2* 8.2* 8.2* 8.1* 8.2*  HCT 26.0* 27.4* 26.2* 26.8* 25.6* 26.5*  MCV 89.0  --  90.7 89.6 89.8 89.2  PLT 219  --  218 238 249 235   Cardiac Enzymes: No results for input(s): "CKTOTAL", "CKMB", "CKMBINDEX", "TROPONINI" in the last 168 hours. BNP: Invalid input(s): "POCBNP" CBG: No results for input(s): "GLUCAP" in the last 168  hours. D-Dimer No results for input(s): "DDIMER" in the last 72 hours. Hgb A1c No results for input(s): "HGBA1C" in the last 72 hours. Lipid Profile No results for input(s): "CHOL", "HDL", "LDLCALC", "TRIG", "CHOLHDL", "LDLDIRECT" in the last 72 hours. Thyroid function studies No results for input(s): "TSH", "T4TOTAL", "T3FREE", "THYROIDAB" in the last 72 hours.  Invalid input(s): "FREET3" Anemia work up Recent Labs    01/01/24 0958  VITAMINB12 636   Urinalysis    Component Value Date/Time   COLORURINE YELLOW 06/18/2023 1207   APPEARANCEUR CLEAR 06/18/2023 1207   LABSPEC 1.014 06/18/2023 1207   PHURINE 5.0 06/18/2023 1207   GLUCOSEU NEGATIVE 06/18/2023 1207   HGBUR NEGATIVE 06/18/2023 1207   HGBUR negative 09/19/2010 1013   BILIRUBINUR NEGATIVE 06/18/2023 1207   BILIRUBINUR n 04/26/2015 1054   KETONESUR NEGATIVE 06/18/2023 1207   PROTEINUR 30 (A) 06/18/2023 1207   UROBILINOGEN 0.2 04/26/2015 1054   UROBILINOGEN 0.2 09/19/2010 1013   NITRITE NEGATIVE 06/18/2023 1207   LEUKOCYTESUR NEGATIVE 06/18/2023 1207   Sepsis Labs Recent Labs  Lab 01/01/24 1010 01/02/24 0238 01/02/24 1434 01/03/24 0242  WBC 4.4 4.8 4.8 5.1   Microbiology No results found for this or any previous visit (from the past 240 hours).   Time coordinating discharge: Over 35 minutes  SIGNED:   Marinda Elk, MD  [facility] 01/03/2024, 9:18 AM Pager   If 7PM-7AM, please contact night-coverage [email or web address] Password [email or web address]

## 2024-01-03 NOTE — TOC Transition Note (Signed)
Transition of Care [facility]) - Discharge Note   Patient Details  Name: Douglas Matthews MRN: [id number] Date of Birth: 01-30-1935  [facility] of [facility]) CM/SW Contact:  Adrian Prows, RN Phone Number: 01/03/2024, 10:23 AM   Clinical Narrative:    D/C orders received; pt has HHRN/Aide w/ [facility]; notified Tia Masker at agency that pt will d/c; agency contact info placed in follow up provider section of d/c instructions; no TOC needs.   Final next level of care: Home w Home Health Services Barriers to Discharge: No Barriers Identified   Patient Goals and CMS Choice Patient states their goals for this hospitalization and ongoing recovery are:: return home          Discharge Placement                       Discharge Plan and Services Additional resources added to the After Visit Summary for                            HH Arranged: RN, Nurse's Aide [facility] Agency: [facility] Date [facility] Agency Contacted: 01/03/24 Time HH Agency Contacted: 1022 Representative spoke with at [facility] Agency: Tia Masker  Social Drivers of Health (SDOH) Interventions SDOH Screenings   Food Insecurity: No Food Insecurity (12/31/2023)  Housing: Low Risk  (12/31/2023)  Transportation Needs: No Transportation Needs (12/31/2023)  Utilities: Not At Risk (12/31/2023)  Depression (PHQ2-9): Low Risk  (08/07/2023)  Financial Resource Strain: Low Risk  (07/08/2022)  Physical Activity: Insufficiently Active (07/08/2022)  Social Connections: Moderately Integrated (12/31/2023)  Stress: No Stress Concern Present (07/08/2022)  Tobacco Use: Low Risk  (12/31/2023)     Readmission Risk Interventions    01/01/2024   12:12 PM  Readmission Risk Prevention Plan  Transportation Screening Complete  PCP or Specialist Appt within 5-7 Days Complete  Home Care Screening Complete  Medication Review (RN CM) Complete

## 2024-01-03 NOTE — Progress Notes (Signed)
Mobility Specialist - Progress Note   01/03/24 0913  Mobility  Activity Ambulated with assistance in hallway  Level of Assistance Contact guard assist, steadying assist  Assistive Device Front wheel walker  Distance Ambulated (ft) 250 ft  Activity Response Tolerated well  Mobility Referral Yes  Mobility visit 1 Mobility  Mobility Specialist Start Time (ACUTE ONLY) 0846  Mobility Specialist Stop Time (ACUTE ONLY) 0901  Mobility Specialist Time Calculation (min) (ACUTE ONLY) 15 min   Pt received in bed and agreeable to mobility. Pt was minA from supine>sitting & CG during ambulation. No complaints during session. Pt to recliner after session with all needs met.    [facility]

## 2024-01-03 NOTE — Plan of Care (Signed)
Discharge instructions given to the patient including medications and follow up.  

## 2024-01-04 ENCOUNTER — Ambulatory Visit (HOSPITAL_COMMUNITY)
Admission: RE | Admit: 2024-01-04 | Discharge: 2024-01-04 | Disposition: A | Discharge status: Home / Self Care | Payer: Medicare Other | Source: Ambulatory Visit | Attending: Nephrology | Admitting: Nephrology

## 2024-01-04 VITALS — BP 117/73 | HR 90 | Temp 97.2°F | Resp 17

## 2024-01-04 DIAGNOSIS — N185 Chronic kidney disease, stage 5: Secondary | ICD-10-CM | POA: Insufficient documentation

## 2024-01-04 DIAGNOSIS — D631 Anemia in chronic kidney disease: Secondary | ICD-10-CM | POA: Insufficient documentation

## 2024-01-04 MED ORDER — DARBEPOETIN ALFA 60 MCG/0.3ML IJ SOSY
60.0000 ug | PREFILLED_SYRINGE | INTRAMUSCULAR | Status: DC
Start: 2024-01-04 — End: 2025-01-30
  Administered 2024-01-04: 60 ug via SUBCUTANEOUS

## 2024-01-04 MED ORDER — DARBEPOETIN ALFA 60 MCG/0.3ML IJ SOSY
PREFILLED_SYRINGE | INTRAMUSCULAR | Status: AC
  Filled 2024-01-04: qty 0.3

## 2024-01-05 ENCOUNTER — Telehealth: Payer: Self-pay | Admitting: *Deleted

## 2024-01-05 ENCOUNTER — Ambulatory Visit: Payer: Medicare Other | Admitting: Family Medicine

## 2024-01-05 ENCOUNTER — Encounter: Payer: Self-pay | Admitting: Family Medicine

## 2024-01-05 VITALS — BP 118/60 | HR 91 | Resp 16 | Ht 68.0 in

## 2024-01-05 DIAGNOSIS — K625 Hemorrhage of anus and rectum: Secondary | ICD-10-CM | POA: Diagnosis not present

## 2024-01-05 DIAGNOSIS — L97812 Non-pressure chronic ulcer of other part of right lower leg with fat layer exposed: Secondary | ICD-10-CM | POA: Diagnosis not present

## 2024-01-05 DIAGNOSIS — I482 Chronic atrial fibrillation, unspecified: Secondary | ICD-10-CM | POA: Diagnosis not present

## 2024-01-05 DIAGNOSIS — I872 Venous insufficiency (chronic) (peripheral): Secondary | ICD-10-CM | POA: Diagnosis not present

## 2024-01-05 DIAGNOSIS — E032 Hypothyroidism due to medicaments and other exogenous substances: Secondary | ICD-10-CM

## 2024-01-05 DIAGNOSIS — L97822 Non-pressure chronic ulcer of other part of left lower leg with fat layer exposed: Secondary | ICD-10-CM

## 2024-01-05 DIAGNOSIS — N185 Chronic kidney disease, stage 5: Secondary | ICD-10-CM

## 2024-01-05 DIAGNOSIS — L97522 Non-pressure chronic ulcer of other part of left foot with fat layer exposed: Secondary | ICD-10-CM | POA: Diagnosis not present

## 2024-01-05 DIAGNOSIS — D631 Anemia in chronic kidney disease: Secondary | ICD-10-CM | POA: Diagnosis not present

## 2024-01-05 DIAGNOSIS — I132 Hypertensive heart and chronic kidney disease with heart failure and with stage 5 chronic kidney disease, or end stage renal disease: Secondary | ICD-10-CM | POA: Diagnosis not present

## 2024-01-05 DIAGNOSIS — L97512 Non-pressure chronic ulcer of other part of right foot with fat layer exposed: Secondary | ICD-10-CM | POA: Diagnosis not present

## 2024-01-05 LAB — CBC
HCT: 25 % — ABNORMAL LOW (ref 39.0–52.0)
Hemoglobin: 8.3 g/dL — ABNORMAL LOW (ref 13.0–17.0)
MCHC: 33.1 g/dL (ref 30.0–36.0)
MCV: 87.7 fL (ref 78.0–100.0)
Platelets: 283 10*3/uL (ref 150.0–400.0)
RBC: 2.85 Mil/uL — ABNORMAL LOW (ref 4.22–5.81)
RDW: 16.6 % — ABNORMAL HIGH (ref 11.5–15.5)
WBC: 5.3 10*3/uL (ref 4.0–10.5)

## 2024-01-05 LAB — BASIC METABOLIC PANEL
BUN: 166 mg/dL (ref 6–23)
CO2: 20 meq/L (ref 19–32)
Calcium: 8 mg/dL — ABNORMAL LOW (ref 8.4–10.5)
Chloride: 105 meq/L (ref 96–112)
Creatinine, Ser: 7.87 mg/dL (ref 0.40–1.50)
GFR: 5.74 mL/min — CL (ref >60.00)
Glucose, Bld: 81 mg/dL (ref 70–99)
Potassium: 4.5 meq/L (ref 3.5–5.1)
Sodium: 143 meq/L (ref 135–145)

## 2024-01-05 NOTE — Assessment & Plan Note (Signed)
Currently on torsemide 100 mg daily, recommend taking medication in the morning. Continue low-salt diet and avoidance of NSAIDs. He is not a good candidate for dialysis. Following with nephrologist, next appointment in 01/2024.

## 2024-01-05 NOTE — Telephone Encounter (Signed)
Informed patient & his wife of results and patient & his wife verbalized understanding.

## 2024-01-05 NOTE — Telephone Encounter (Signed)
 CRITICAL VALUE STICKER  CRITICAL VALUE:  BUN 166 Creatinine, 7.87 GFR 5.74 RECEIVER (on-site recipient of call): Vernell CMA  DATE & TIME NOTIFIED: 01/05/24  MESSENGER (representative from lab):Ernst GLENWO[facility]  MD NOTIFIED: [name]   TIME OF NOTIFICATION:4.02 pm  RESPONSE:

## 2024-01-05 NOTE — Patient Instructions (Addendum)
 A few things to remember from today's visit:  Atrial fibrillation, chronic (HCC)  Hypothyroidism due to medication  CKD (chronic kidney disease), stage V (HCC) - Plan: Basic metabolic panel  Rectal bleed - Plan: CBC No changes today. Thyroid  check in 4-6 weeks.  If you need refills for medications you take chronically, please call your pharmacy. Do not use My Chart to request refills or for acute issues that need immediate attention. If you send a my chart message, it may take a few days to be addressed, specially if I am not in the office.  Please be sure medication list is accurate. If a new problem present, please set up appointment sooner than planned today.

## 2024-01-05 NOTE — Assessment & Plan Note (Signed)
Rhythm controlled. Currently on amiodarone 200 mg daily. Eliquis 2.5 mg twice daily will discontinue due to GI bleed. Following with cardiologist.

## 2024-01-05 NOTE — Assessment & Plan Note (Addendum)
TSH has not been at goal since initial diagnosis. We discussed some side effects of amiodarone. Continue levothyroxine at 100 mcg daily. TSH 3 to 4 weeks.

## 2024-01-05 NOTE — Telephone Encounter (Signed)
Please inform pt that kidney function is still abnormal but otherwise stable. Anemia is also stable. Labs can be repeated at his nephrologist's office, before if rectal bleed reoccurs. Thanks, BJ

## 2024-01-05 NOTE — Assessment & Plan Note (Signed)
Aggravated by recent episode of rectal bleed. H/H since 09/2023 has been mostly 8.1-8.8/25.6-27.7, one episode of Hg 7.4 and 7.8 in 09/2023. S/P Feraheme in 07/2023. Following with nephrologist.

## 2024-01-05 NOTE — Progress Notes (Signed)
 ACUTE VISIT Chief Complaint  Patient presents with   Hospitalization Follow-up   HPI: Douglas Matthews is a 89 y.o. male with a PMHx significant for CKD V, HTN, HLD, prediabetes, HFrEF, LBBB, atrial fibrillation,and pulmonary HTN, who is here today with his wife for hospital follow up.  Patient hospitalized from 1/30 to 2/2 with rectal bleeding Today 2nd day after hospital discharge.  Presented to the ED the date of admission for a day of bright red blood per rectum. Hemoglobin is around 8, BUN 159 creatinine 8.3 patient started on IV fluids in the ED, GI consulted for evaluation Eliquis  was held.  Still taking Calcitrol, Amiodarone , Levothyroxine , and Torsemide .   Lab Results  Component Value Date   WBC 5.1 01/03/2024   HGB 8.2 (L) 01/03/2024   HCT 26.5 (L) 01/03/2024   MCV 89.2 01/03/2024   PLT 235 01/03/2024   Lab Results  Component Value Date   NA 144 12/31/2023   CL 108 12/31/2023   K 4.4 12/31/2023   CO2 20 (L) 12/31/2023   BUN 159 (H) 12/31/2023   CREATININE 8.38 (H) 12/31/2023   GFRNONAA 6 (L) 12/31/2023   CALCIUM  7.9 (L) 12/31/2023   PHOS 9.3 (H) 12/07/2023   ALBUMIN  2.6 (L) 12/31/2023   GLUCOSE 103 (H) 12/31/2023   Since discharge, he has had not more rectal bleeding.  He says he is having some fatigue.  His appetite is good.  He has had a wound care nurse out twice, and has also seen occupational therapy. Physical therapy is still pending.  He needs help to shower, and has a chair in the shower.  Needs assistance with dressing and transfer. Using a cane.  Urinary frequently, during the day and at night,interfering with his and wife sleep. He is on Torsemide  100 mg daily, which he is taking around 3-4 pm.  His wife is also concerned about the skin on his lower legs. The wound care nurse is helping her to manage his blisters and ulcers. Problem has improved, bandage changed today. No further with weeping.  Denies any nausea, vomiting, abdominal pain,  blood in his urine, or black stool.   Atrial fib on Amiodarone  200 mg daily.  His cardiologist is retiring, and he is going to establish with a new provider.  Next appointment with nephrology is in 01/2024.  No follow up has been scheduled with gastroenterology.   Hypothyroidism: He is on Levothyroxine  100 mcg daily. Lab Results  Component Value Date   TSH 50.62 (H) 12/14/2023   Review of Systems  Constitutional:  Positive for activity change and fatigue. Negative for appetite change, chills and fever.  HENT:  Positive for nosebleeds. Negative for sore throat.   Respiratory:  Negative for cough, shortness of breath and wheezing.   Cardiovascular:  Positive for leg swelling. Negative for chest pain.  Endocrine: Negative for cold intolerance and heat intolerance.  Genitourinary:  Negative for decreased urine volume and dysuria.  Neurological:  Negative for syncope and headaches.  Psychiatric/Behavioral:  Negative for confusion and hallucinations.   See other pertinent positives and negatives in HPI.  Current Outpatient Medications on File Prior to Visit  Medication Sig Dispense Refill   aluminum  sulfate-calcium  acetate (DOMEBORO) packet Apply 1 packet topically 3 (three) times daily. Mixed it in water. (Patient taking differently: Apply 1 packet topically 3 (three) times daily as needed (Itching).) 100 each 1   amiodarone  (PACERONE ) 200 MG tablet Take 1 tablet (200 mg total) by mouth daily. 90 tablet  3   calcitRIOL  (ROCALTROL ) 0.25 MCG capsule Take 0.25 mcg by mouth every other day.     COD LIVER OIL PO Take 1 tablet by mouth daily.     Garlic 1000 MG CAPS Take 1,000 mg by mouth daily.     levothyroxine  (SYNTHROID ) 100 MCG tablet Take 1 tablet (100 mcg total) by mouth daily. 90 tablet 0   Multiple Vitamin (MULTIVITAMIN) capsule Take 1 capsule by mouth daily.     torsemide  (DEMADEX ) 100 MG tablet Take 1 tablet (100 mg total) by mouth daily. 90 tablet 1   No current  facility-administered medications on file prior to visit.   Past Medical History:  Diagnosis Date   Anemia    Atrial fibrillation (HCC)    Atrial flutter (HCC)    BPH (benign prostatic hypertrophy)    Chronic HFrEF (heart failure with reduced ejection fraction) (HCC)    Chronic kidney disease (CKD), active medical management without dialysis, stage 5 (HCC)    Hyperlipidemia    Hypertension    Hypothyroidism    LBBB (left bundle branch block)    No Known Allergies  Social History   Socioeconomic History   Marital status: Married    Spouse name: Not on file   Number of children: Not on file   Years of education: Not on file   Highest education level: Not on file  Occupational History   Occupation: Retired    Comment: [employer]  Tobacco Use   Smoking status: Never   Smokeless tobacco: Never  Vaping Use   Vaping status: Never Used  Substance and Sexual Activity   Alcohol  use: No   Drug use: No   Sexual activity: Not on file  Other Topics Concern   Not on file  Social History Narrative   Married x 57 years    Right handed    Social Drivers of Health   Financial Resource Strain: Low Risk  (07/08/2022)   Overall Financial Resource Strain (CARDIA)    Difficulty of Paying Living Expenses: Not hard at all  Food Insecurity: No Food Insecurity (12/31/2023)   Hunger Vital Sign    Worried About Running Out of Food in the Last Year: Never true    Ran Out of Food in the Last Year: Never true  Transportation Needs: No Transportation Needs (12/31/2023)   PRAPARE - Administrator, Civil Service (Medical): No    Lack of Transportation (Non-Medical): No  Physical Activity: Insufficiently Active (07/08/2022)   Exercise Vital Sign    Days of Exercise per Week: 2 days    Minutes of Exercise per Session: 30 min  Stress: No Stress Concern Present (07/08/2022)   [employer] of Occupational Health - Occupational Stress Questionnaire    Feeling of Stress : Not at all  Social  Connections: Moderately Integrated (12/31/2023)   Social Connection and Isolation Panel [NHANES]    Frequency of Communication with Friends and Family: Three times a week    Frequency of Social Gatherings with Friends and Family: Three times a week    Attends Religious Services: More than 4 times per year    Active Member of Clubs or Organizations: No    Attends Banker Meetings: Never    Marital Status: Married   Today's Vitals   01/05/24 1136  BP: 118/60  Pulse: 91  Resp: 16  SpO2: 98%  Height: 5' 8 (1.727 m)   Body mass index is 20.86 kg/m.  Physical Exam Vitals and nursing  note reviewed.  Constitutional:      General: He is not in acute distress.    Appearance: He is well-developed.  HENT:     Head: Normocephalic and atraumatic.  Eyes:     Conjunctiva/sclera: Conjunctivae normal.  Cardiovascular:     Rate and Rhythm: Normal rate and regular rhythm.     Heart sounds: No murmur heard. Pulmonary:     Effort: Pulmonary effort is normal. No respiratory distress.     Breath sounds: Normal breath sounds.  Abdominal:     Palpations: Abdomen is soft. There is no mass.     Tenderness: There is no abdominal tenderness.  Musculoskeletal:     Right lower leg: 1+ Pitting Edema present.     Left lower leg: 1+ Pitting Edema present.  Skin:    General: Skin is warm.     Findings: No erythema or rash.     Comments: Venous ulcers covered with bandaged (left pretibial), changed today by nurse and one another one covered with medicated bandage. No erythema appreciated or drainage. Hyperpigmented post inflammatory changes LE's, bilateral.  Neurological:     Mental Status: He is alert and oriented to person, place, and time.     Comments: Gait assisted with a cane.  Psychiatric:        Mood and Affect: Mood and affect normal.   ASSESSMENT AND PLAN:  Mr. Boehringer was seen today for hospital follow up for rectal bleeding.  Lab Results  Component Value Date   NA 143  01/05/2024   CL 105 01/05/2024   K 4.5 01/05/2024   CO2 20 01/05/2024   BUN 166 (HH) 01/05/2024   CREATININE 7.87 (HH) 01/05/2024   GFR 5.74 (LL) 01/05/2024   CALCIUM  8.0 (L) 01/05/2024   PHOS 9.3 (H) 12/07/2023   ALBUMIN  2.6 (L) 12/31/2023   GLUCOSE 81 01/05/2024   Lab Results  Component Value Date   WBC 5.3 01/05/2024   HGB 8.3 Repeated and verified X2. (L) 01/05/2024   HCT 25.0 (L) 01/05/2024   MCV 87.7 01/05/2024   PLT 283.0 01/05/2024   Ulcer of shin, left, with fat layer exposed (HCC) Has another one on right pretibial area. Improving. Wound care through [facility] services. Monitor for signs of infection. LE elevation a few times through the day to continue and walking a few times during the day for a few min as tolerated.  Rectal bleed GI consulted during hospitalization, for the management recommended. He has not had any new episodes since hospital discharge. Currently he is not longer on anticoagulation therapy.  ? Hemorrhoids, reports occasional hard stools. Adequate fiber intake trying to avoid constipation/straining. Instructed about warning signs.  -     CBC; Future  Atrial fibrillation, chronic (HCC) Assessment & Plan: Rhythm controlled. Currently on amiodarone  200 mg daily. Eliquis  2.5 mg twice daily will discontinue due to GI bleed. Following with cardiologist.   Hypothyroidism due to medication Assessment & Plan: TSH has not been at goal since initial diagnosis. We discussed some side effects of amiodarone . Continue levothyroxine  at 100 mcg daily. TSH 3 to 4 weeks.  CKD (chronic kidney disease), stage V (HCC) Assessment & Plan: Currently on torsemide  100 mg daily, recommend taking medication in the morning. Continue low-salt diet and avoidance of NSAIDs. He is not a good candidate for dialysis. Following with nephrologist, next appointment in 01/2024.  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 H/H since 09/2023 has been mostly 8.1-8.8/25.6-27.7, one episode of Hg 7.4 and 7.8 in 09/2023. S/P Feraheme  in 07/2023. Following with nephrologist.  I spent a total of 43 minutes in both face to face and non face to face activities for this visit on the date of this encounter. During this time history was obtained and documented, examination was performed, prior labs reviewed, and assessment/plan discussed.  Return in about 3 months (around 04/03/2024), or if symptoms worsen or fail to improve, for keep next appointment.  I, Leonce PARAS Wierda, acting as a scribe for Lilu Mcglown, MD., have documented all relevant documentation on the behalf of Sherell Christoffel, MD, as directed by  Aigner Horseman, MD while in the presence of Lenette Rau, MD.   I, Sila Sarsfield, MD, have reviewed all documentation for this visit. The documentation on 01/05/24 for the exam, diagnosis, procedures, and orders are all accurate and complete.  Wildon Cuevas G. Valine Drozdowski, MD  [facility]. [place] office.

## 2024-01-08 DIAGNOSIS — I872 Venous insufficiency (chronic) (peripheral): Secondary | ICD-10-CM | POA: Diagnosis not present

## 2024-01-08 DIAGNOSIS — L97812 Non-pressure chronic ulcer of other part of right lower leg with fat layer exposed: Secondary | ICD-10-CM | POA: Diagnosis not present

## 2024-01-08 DIAGNOSIS — L97512 Non-pressure chronic ulcer of other part of right foot with fat layer exposed: Secondary | ICD-10-CM | POA: Diagnosis not present

## 2024-01-08 DIAGNOSIS — L97522 Non-pressure chronic ulcer of other part of left foot with fat layer exposed: Secondary | ICD-10-CM | POA: Diagnosis not present

## 2024-01-08 DIAGNOSIS — I132 Hypertensive heart and chronic kidney disease with heart failure and with stage 5 chronic kidney disease, or end stage renal disease: Secondary | ICD-10-CM | POA: Diagnosis not present

## 2024-01-08 DIAGNOSIS — L97822 Non-pressure chronic ulcer of other part of left lower leg with fat layer exposed: Secondary | ICD-10-CM | POA: Diagnosis not present

## 2024-01-10 ENCOUNTER — Emergency Department (HOSPITAL_COMMUNITY): Payer: Medicare Other

## 2024-01-10 ENCOUNTER — Inpatient Hospital Stay (HOSPITAL_COMMUNITY)
Admission: EM | Admit: 2024-01-10 | Discharge: 2024-01-13 | DRG: 291 | Disposition: A | Discharge status: Home Health | Payer: Medicare Other | Attending: Internal Medicine | Admitting: Internal Medicine

## 2024-01-10 ENCOUNTER — Other Ambulatory Visit: Payer: Self-pay

## 2024-01-10 DIAGNOSIS — Z7901 Long term (current) use of anticoagulants: Secondary | ICD-10-CM

## 2024-01-10 DIAGNOSIS — I480 Paroxysmal atrial fibrillation: Secondary | ICD-10-CM | POA: Diagnosis present

## 2024-01-10 DIAGNOSIS — D631 Anemia in chronic kidney disease: Secondary | ICD-10-CM | POA: Diagnosis present

## 2024-01-10 DIAGNOSIS — Z833 Family history of diabetes mellitus: Secondary | ICD-10-CM | POA: Diagnosis not present

## 2024-01-10 DIAGNOSIS — I2489 Other forms of acute ischemic heart disease: Secondary | ICD-10-CM | POA: Diagnosis present

## 2024-01-10 DIAGNOSIS — Z515 Encounter for palliative care: Secondary | ICD-10-CM | POA: Diagnosis not present

## 2024-01-10 DIAGNOSIS — I132 Hypertensive heart and chronic kidney disease with heart failure and with stage 5 chronic kidney disease, or end stage renal disease: Secondary | ICD-10-CM | POA: Diagnosis not present

## 2024-01-10 DIAGNOSIS — E861 Hypovolemia: Secondary | ICD-10-CM | POA: Diagnosis not present

## 2024-01-10 DIAGNOSIS — Z1152 Encounter for screening for COVID-19: Secondary | ICD-10-CM

## 2024-01-10 DIAGNOSIS — J189 Pneumonia, unspecified organism: Secondary | ICD-10-CM | POA: Diagnosis not present

## 2024-01-10 DIAGNOSIS — E872 Acidosis, unspecified: Secondary | ICD-10-CM | POA: Diagnosis present

## 2024-01-10 DIAGNOSIS — N186 End stage renal disease: Secondary | ICD-10-CM | POA: Diagnosis present

## 2024-01-10 DIAGNOSIS — R0602 Shortness of breath: Secondary | ICD-10-CM | POA: Diagnosis not present

## 2024-01-10 DIAGNOSIS — Z66 Do not resuscitate: Secondary | ICD-10-CM | POA: Diagnosis present

## 2024-01-10 DIAGNOSIS — J9811 Atelectasis: Secondary | ICD-10-CM | POA: Diagnosis not present

## 2024-01-10 DIAGNOSIS — Z79899 Other long term (current) drug therapy: Secondary | ICD-10-CM | POA: Diagnosis not present

## 2024-01-10 DIAGNOSIS — E8770 Fluid overload, unspecified: Secondary | ICD-10-CM | POA: Diagnosis present

## 2024-01-10 DIAGNOSIS — I509 Heart failure, unspecified: Secondary | ICD-10-CM | POA: Diagnosis not present

## 2024-01-10 DIAGNOSIS — D649 Anemia, unspecified: Secondary | ICD-10-CM | POA: Diagnosis not present

## 2024-01-10 DIAGNOSIS — E785 Hyperlipidemia, unspecified: Secondary | ICD-10-CM | POA: Diagnosis present

## 2024-01-10 DIAGNOSIS — N400 Enlarged prostate without lower urinary tract symptoms: Secondary | ICD-10-CM | POA: Diagnosis present

## 2024-01-10 DIAGNOSIS — E039 Hypothyroidism, unspecified: Secondary | ICD-10-CM | POA: Diagnosis present

## 2024-01-10 DIAGNOSIS — N185 Chronic kidney disease, stage 5: Secondary | ICD-10-CM | POA: Diagnosis not present

## 2024-01-10 DIAGNOSIS — I5082 Biventricular heart failure: Secondary | ICD-10-CM | POA: Diagnosis present

## 2024-01-10 DIAGNOSIS — N184 Chronic kidney disease, stage 4 (severe): Principal | ICD-10-CM

## 2024-01-10 DIAGNOSIS — I130 Hypertensive heart and chronic kidney disease with heart failure and stage 1 through stage 4 chronic kidney disease, or unspecified chronic kidney disease: Secondary | ICD-10-CM | POA: Diagnosis not present

## 2024-01-10 DIAGNOSIS — I120 Hypertensive chronic kidney disease with stage 5 chronic kidney disease or end stage renal disease: Secondary | ICD-10-CM | POA: Diagnosis not present

## 2024-01-10 DIAGNOSIS — I5043 Acute on chronic combined systolic (congestive) and diastolic (congestive) heart failure: Secondary | ICD-10-CM | POA: Diagnosis present

## 2024-01-10 DIAGNOSIS — Z7189 Other specified counseling: Secondary | ICD-10-CM | POA: Diagnosis not present

## 2024-01-10 DIAGNOSIS — I21A1 Myocardial infarction type 2: Secondary | ICD-10-CM | POA: Diagnosis present

## 2024-01-10 DIAGNOSIS — N281 Cyst of kidney, acquired: Secondary | ICD-10-CM | POA: Diagnosis not present

## 2024-01-10 DIAGNOSIS — Z79890 Hormone replacement therapy (postmenopausal): Secondary | ICD-10-CM

## 2024-01-10 DIAGNOSIS — N179 Acute kidney failure, unspecified: Principal | ICD-10-CM | POA: Diagnosis present

## 2024-01-10 DIAGNOSIS — I700 Atherosclerosis of aorta: Secondary | ICD-10-CM | POA: Diagnosis not present

## 2024-01-10 DIAGNOSIS — R54 Age-related physical debility: Secondary | ICD-10-CM | POA: Diagnosis present

## 2024-01-10 DIAGNOSIS — R918 Other nonspecific abnormal finding of lung field: Secondary | ICD-10-CM | POA: Diagnosis not present

## 2024-01-10 DIAGNOSIS — I517 Cardiomegaly: Secondary | ICD-10-CM | POA: Diagnosis not present

## 2024-01-10 DIAGNOSIS — N189 Chronic kidney disease, unspecified: Secondary | ICD-10-CM

## 2024-01-10 LAB — BLOOD GAS, VENOUS
Acid-base deficit: 7.1 mmol/L — ABNORMAL HIGH (ref 0.0–2.0)
Bicarbonate: 18.7 mmol/L — ABNORMAL LOW (ref 20.0–28.0)
O2 Saturation: 29.5 %
Patient temperature: 37
pCO2, Ven: 38 mm[Hg] — ABNORMAL LOW (ref 44–60)
pH, Ven: 7.3 (ref 7.25–7.43)
pO2, Ven: <31 mm[Hg] — CL (ref 32–45)

## 2024-01-10 LAB — COMPREHENSIVE METABOLIC PANEL
ALT: 33 U/L (ref 0–44)
AST: 36 U/L (ref 15–41)
Albumin: 2.6 g/dL — ABNORMAL LOW (ref 3.5–5.0)
Alkaline Phosphatase: 93 U/L (ref 38–126)
Anion gap: 22 — ABNORMAL HIGH (ref 5–15)
BUN: 186 mg/dL — ABNORMAL HIGH (ref 8–23)
CO2: 15 mmol/L — ABNORMAL LOW (ref 22–32)
Calcium: 8.3 mg/dL — ABNORMAL LOW (ref 8.9–10.3)
Chloride: 106 mmol/L (ref 98–111)
Creatinine, Ser: 8.56 mg/dL — ABNORMAL HIGH (ref 0.61–1.24)
GFR, Estimated: 6 mL/min — ABNORMAL LOW (ref >60)
Glucose, Bld: 105 mg/dL — ABNORMAL HIGH (ref 70–99)
Potassium: 4.8 mmol/L (ref 3.5–5.1)
Sodium: 143 mmol/L (ref 135–145)
Total Bilirubin: 0.9 mg/dL (ref 0.0–1.2)
Total Protein: 7.4 g/dL (ref 6.5–8.1)

## 2024-01-10 LAB — RESP PANEL BY RT-PCR (RSV, FLU A&B, COVID)  RVPGX2
Influenza A by PCR: NEGATIVE
Influenza B by PCR: NEGATIVE
Resp Syncytial Virus by PCR: NEGATIVE
SARS Coronavirus 2 by RT PCR: NEGATIVE

## 2024-01-10 LAB — CBC
HCT: 28 % — ABNORMAL LOW (ref 39.0–52.0)
Hemoglobin: 8.6 g/dL — ABNORMAL LOW (ref 13.0–17.0)
MCH: 26.9 pg (ref 26.0–34.0)
MCHC: 30.7 g/dL (ref 30.0–36.0)
MCV: 87.5 fL (ref 80.0–100.0)
Platelets: 317 10*3/uL (ref 150–400)
RBC: 3.2 MIL/uL — ABNORMAL LOW (ref 4.22–5.81)
RDW: 16.7 % — ABNORMAL HIGH (ref 11.5–15.5)
WBC: 4.9 10*3/uL (ref 4.0–10.5)
nRBC: 0.4 % — ABNORMAL HIGH (ref 0.0–0.2)

## 2024-01-10 LAB — TROPONIN I (HIGH SENSITIVITY)
Troponin I (High Sensitivity): 330 ng/L (ref <18)
Troponin I (High Sensitivity): 321 ng/L (ref <18)

## 2024-01-10 LAB — BRAIN NATRIURETIC PEPTIDE: B Natriuretic Peptide: 1141.1 pg/mL — ABNORMAL HIGH (ref 0.0–100.0)

## 2024-01-10 MED ORDER — ONDANSETRON HCL 4 MG PO TABS: 4.0000 mg | ORAL_TABLET | Freq: Four times a day (QID) | ORAL | Status: DC | PRN

## 2024-01-10 MED ORDER — FUROSEMIDE 10 MG/ML IJ SOLN
120.0000 mg | Freq: Once | INTRAVENOUS | Status: AC
  Administered 2024-01-10: 120 mg via INTRAVENOUS
  Filled 2024-01-10: qty 2

## 2024-01-10 MED ORDER — LEVOTHYROXINE SODIUM 100 MCG PO TABS
100.0000 ug | ORAL_TABLET | Freq: Every day | ORAL | Status: DC
  Administered 2024-01-11 – 2024-01-13 (×3): 100 ug via ORAL
  Filled 2024-01-10 (×3): qty 1

## 2024-01-10 MED ORDER — AMIODARONE HCL 200 MG PO TABS
200.0000 mg | ORAL_TABLET | Freq: Every day | ORAL | Status: DC
  Administered 2024-01-11 – 2024-01-13 (×3): 200 mg via ORAL
  Filled 2024-01-10 (×4): qty 1

## 2024-01-10 MED ORDER — ACETAMINOPHEN 650 MG RE SUPP: 650.0000 mg | Freq: Four times a day (QID) | RECTAL | Status: DC | PRN

## 2024-01-10 MED ORDER — FUROSEMIDE 10 MG/ML IJ SOLN
120.0000 mg | Freq: Two times a day (BID) | INTRAVENOUS | Status: DC
Start: 2024-01-11 — End: 2024-01-12
  Administered 2024-01-11 – 2024-01-12 (×3): 120 mg via INTRAVENOUS
  Filled 2024-01-10 (×3): qty 12
  Filled 2024-01-10: qty 10
  Filled 2024-01-10: qty 2

## 2024-01-10 MED ORDER — SODIUM BICARBONATE 650 MG PO TABS
1300.0000 mg | ORAL_TABLET | Freq: Two times a day (BID) | ORAL | Status: DC
  Administered 2024-01-10 – 2024-01-13 (×6): 1300 mg via ORAL
  Filled 2024-01-10 (×7): qty 2

## 2024-01-10 MED ORDER — ACETAMINOPHEN 325 MG PO TABS: 650.0000 mg | ORAL_TABLET | Freq: Four times a day (QID) | ORAL | Status: DC | PRN

## 2024-01-10 MED ORDER — ONDANSETRON HCL 4 MG/2ML IJ SOLN: 4.0000 mg | Freq: Four times a day (QID) | INTRAMUSCULAR | Status: DC | PRN

## 2024-01-10 MED ORDER — ALBUTEROL SULFATE HFA 108 (90 BASE) MCG/ACT IN AERS: 2.0000 | INHALATION_SPRAY | RESPIRATORY_TRACT | Status: DC | PRN

## 2024-01-10 MED ORDER — HEPARIN SODIUM (PORCINE) 5000 UNIT/ML IJ SOLN
5000.0000 [IU] | Freq: Three times a day (TID) | INTRAMUSCULAR | Status: DC
  Administered 2024-01-10 – 2024-01-13 (×8): 5000 [IU] via SUBCUTANEOUS
  Filled 2024-01-10 (×8): qty 1

## 2024-01-10 MED ORDER — FUROSEMIDE 10 MG/ML IJ SOLN
120.0000 mg | Freq: Once | INTRAVENOUS | Status: DC
  Filled 2024-01-10: qty 10

## 2024-01-10 MED ORDER — FUROSEMIDE 10 MG/ML IJ SOLN
120.0000 mg | Freq: Two times a day (BID) | INTRAMUSCULAR | Status: DC
  Filled 2024-01-10 (×2): qty 12

## 2024-01-10 MED ORDER — CALCITRIOL 0.25 MCG PO CAPS
0.2500 ug | ORAL_CAPSULE | ORAL | Status: DC
  Administered 2024-01-11 – 2024-01-13 (×2): 0.25 ug via ORAL
  Filled 2024-01-10 (×3): qty 1

## 2024-01-10 NOTE — Consult Note (Signed)
 Renal Service Consult Note [facility]  Douglas Matthews 01/10/2024 Douglas JONETTA Fret, MD Requesting Physician: Dr. Dena  Reason for Consult: Renal failure  HPI: The patient is a 89 y.o. year-old w/ PMH as below who presented to ED today reporting progressive SOB, also swelling in the legs. He sees his nephrologist regarding his progressively worsening kidney failure and has been deemed not a candidate for dialysis due to severe heart disease. In ED pt was afeb and BP's 105- 115/ 76- 86, HR 60s, RR 16-19. On RA he was 92-100% SpO2. CXR showed small bilateral pleural effusions and bibasilar atelectasis or infiltrate. Pt was dc'd from the hospital last week and since then he has had progressive SOB. Also states his legs are swollen. Pt was admitted and started on IV lasix . BUN was 186 and creat 8.5 which is similar to his recent creatinine levels. We are asked to see for renal failure management.   Pt seen in room. Hx as above. F/b Dr Rayburn. Not a candidate for dialysis due to severe heart disease/ CHF. Pt denies sig nausea, loss of appetite or energy, no falling and no confusion. Voids about once daily. Takes torsemide  100mg  every day at home.   ROS - denies CP, no joint pain, no HA, no blurry vision, no rash, no diarrhea, no nausea/ vomiting, no dysuria, no difficulty voiding   Past Medical History  Past Medical History:  Diagnosis Date   Anemia    Atrial fibrillation (HCC)    Atrial flutter (HCC)    BPH (benign prostatic hypertrophy)    Chronic HFrEF (heart failure with reduced ejection fraction) (HCC)    Chronic kidney disease (CKD), active medical management without dialysis, stage 5 (HCC)    Hyperlipidemia    Hypertension    Hypothyroidism    LBBB (left bundle branch block)    Past Surgical History  Past Surgical History:  Procedure Laterality Date   APPENDECTOMY     CARDIOVERSION N/A 10/29/2022   Procedure: CARDIOVERSION;  Surgeon: Pietro Redell RAMAN, MD;   Location: [facility] ENDOSCOPY;  Service: Cardiovascular;  Laterality: N/A;   CARDIOVERSION N/A 05/21/2023   Procedure: CARDIOVERSION;  Surgeon: Mona Vinie BROCKS, MD;  Location: [facility] INVASIVE CV LAB;  Service: Cardiovascular;  Laterality: N/A;   HERNIA REPAIR     TEE WITHOUT CARDIOVERSION N/A 10/29/2022   Procedure: TRANSESOPHAGEAL ECHOCARDIOGRAM (TEE);  Surgeon: Pietro Redell RAMAN, MD;  Location: [facility] ENDOSCOPY;  Service: Cardiovascular;  Laterality: N/A;   Family History  Family History  Problem Relation Age of Onset   Diabetes Son    Heart disease Other    Social History  reports that he has never smoked. He has never used smokeless tobacco. He reports that he does not drink alcohol  and does not use drugs. Allergies No Known Allergies Home medications Prior to Admission medications   Medication Sig Start Date End Date Taking? Authorizing Provider  aluminum  sulfate-calcium  acetate (DOMEBORO) packet Apply 1 packet topically 3 (three) times daily. Mixed it in water. Patient taking differently: Apply 1 packet topically 3 (three) times daily as needed (Itching- mix into water). 12/14/23  Yes [name], [name], MD  amiodarone  (PACERONE ) 200 MG tablet Take 1 tablet (200 mg total) by mouth daily. 09/03/23  Yes Wyn Jackee VEAR Mickey., NP  calcitRIOL  (ROCALTROL ) 0.25 MCG capsule Take 0.25 mcg by mouth every other day.   Yes [provider]  levothyroxine  (SYNTHROID ) 100 MCG tablet Take 1 tablet (100 mcg total) by mouth daily. Patient taking  differently: Take 100 mcg by mouth daily before breakfast. 12/15/23  Yes [name], [name], MD  torsemide  (DEMADEX ) 100 MG tablet Take 1 tablet (100 mg total) by mouth daily. 12/18/23  Yes [name], [name], MD     Vitals:   01/10/24 1237 01/10/24 1600 01/10/24 1756 01/10/24 2030  BP:  93/76  117/66  Pulse:  91  65  Resp:  14  16  Temp:   97.9 F (36.6 C)   TempSrc:   Oral   SpO2:  91%  100%  Weight: 62.1 kg     Height: 5' 8 (1.727 m)      Exam Gen alert, no  distress, thin, somewhat frail in appearance No ^wobc No rash, cyanosis or gangrene Sclera anicteric, throat clear  No jvd or bruits Chest clear bilat to bases, no rales/ wheezing RRR no MRG Abd soft ntnd no mass or ascites +bs GU nl male MS no joint effusions or deformity Ext trace bilat pretib edema, no other edema Neuro is alert, Ox 3 , nf      Renal-related home meds: - rocaltrol  02.5 mcg daily - demadex  100mg  qd  Date   Creat  eGFR (ml/min) 2009- 2023  1.70-  2.95 20- 51 ml/min, CKD 3b Feb - mar 2024 3.04- 5.30 10- 19 ml/min, CKD 4/5  Jun-sept 2024  5.28- 6.92 6- 10 ml/min, CKD 05 Sep 2023  8.00 Dec 2024  8.91 1/03- 12/31/23  8.38- 8.69 5- 6 ml/min 01/05/24  7.87 01/10/24  8.56  6       Assessment/ Plan: SOB - possibly vol excess and/or poor cardiac outpt. CXR small effusions w/o pulm edema. Not grossly vol overloaded on exam. Agree w/ IV lasix  at higher dose for now. Follow UOP, symptoms, exam.  CKD 5 - has been progressing w/ respect to his CKD. No obvious uremic symptoms, however pt look somewhat frail and suspect he is uremic in some way or another. Pt not a candidate for dialysis because of severe heart disease. Will order palliative consult for GOC.   PAF - eliquis  stopped due to GI bleeding Hypothyroidism      Myer Fret  MD [facility] 01/10/2024, 9:30 PM  Recent Labs  Lab 01/05/24 1211 01/10/24 1243  HGB 8.3 Repeated and verified X2.* 8.6*  ALBUMIN   --  2.6*  CALCIUM  8.0* 8.3*  CREATININE 7.87* 8.56*  K 4.5 4.8   Inpatient medications:  [START ON 01/11/2024] amiodarone   200 mg Oral Daily   [START ON 01/11/2024] calcitRIOL   0.25 mcg Oral QODAY   heparin   5,000 Units Subcutaneous Q8H   [START ON 01/11/2024] levothyroxine   100 mcg Oral Q0600   sodium bicarbonate   1,300 mg Oral BID    furosemide      acetaminophen  **OR** acetaminophen , albuterol , ondansetron  **OR** ondansetron  (ZOFRAN ) IV

## 2024-01-10 NOTE — ED Provider Triage Note (Signed)
 Emergency Medicine Provider Triage Evaluation Note  Douglas Matthews , a 89 y.o. male  was evaluated in triage.  Pt complains of SOB.  Review of Systems  Positive:  Negative:   Physical Exam  BP 104/78 (BP Location: Left Arm)   Pulse 89   Temp 97.7 F (36.5 C) (Oral)   Resp 14   Ht 5' 8 (1.727 m)   Wt 62.1 kg   SpO2 91%   BMI 20.83 kg/m  Gen:   Awake, no distress   Resp:  Normal effort  MSK:   Moves extremities without difficulty  Other:    Medical Decision Making  Medically screening exam initiated at 12:46 PM.  Appropriate orders placed.  Douglas Matthews was informed that the remainder of the evaluation will be completed by another provider, this initial triage assessment does not replace that evaluation, and the importance of remaining in the ED until their evaluation is complete.  Patient with intermittent SOB over the past many years. Wife stating that it just seemed like he was breathing harder last night.    Hoy Nidia FALCON, [place] 01/10/24 1246

## 2024-01-10 NOTE — H&P (Signed)
 History and Physical    Douglas Matthews FMW:[id number] DOB: 01/20/1935 DOA: 01/10/2024  PCP: [name], [name], MD   Chief Complaint:  sob  HPI: Douglas Matthews is a 89 y.o. male with medical history significant of A-fib, BPH, CKD stage V, hypertension, hyperlipidemia, hypothyroidism who presented to the emergency department due to progressively worsening shortness of breath.  Patient is endorsing worsening swelling in bilateral lower extremities.  He sees his nephrologist regarding his progressively worsening kidney failure and has been deemed not a candidate for dialysis.  He presented to the emergency department where he was found to be afebrile and hemodynamically stable.  Labs were obtained on presentation which demonstrated WBC 4.9, hemoglobin 8.6 bicarb 15, creatinine 8.5, respiratory viral panel negative, pH 7.3, troponin 330.  Patient was admitted for further workup.  On admission he has been given 120 mg of Lasix  with good urinary output.   Review of Systems: Review of Systems  Constitutional:  Negative for chills and fever.  HENT: Negative.    Eyes: Negative.   Respiratory:  Positive for shortness of breath.   Cardiovascular:  Negative for chest pain and palpitations.  Gastrointestinal: Negative.   Genitourinary: Negative.   Musculoskeletal: Negative.   Skin: Negative.   Neurological:  Positive for dizziness.  Endo/Heme/Allergies: Negative.   Psychiatric/Behavioral: Negative.    All other systems reviewed and are negative.    As per HPI otherwise 10 point review of systems negative.   No Known Allergies  Past Medical History:  Diagnosis Date   Anemia    Atrial fibrillation (HCC)    Atrial flutter (HCC)    BPH (benign prostatic hypertrophy)    Chronic HFrEF (heart failure with reduced ejection fraction) (HCC)    Chronic kidney disease (CKD), active medical management without dialysis, stage 5 (HCC)    Hyperlipidemia    Hypertension    Hypothyroidism    LBBB (left  bundle branch block)     Past Surgical History:  Procedure Laterality Date   APPENDECTOMY     CARDIOVERSION N/A 10/29/2022   Procedure: CARDIOVERSION;  Surgeon: Pietro Redell RAMAN, MD;  Location[facility] ENDOSCOPY;  Service: Cardiovascular;  Laterality: N/A;   CARDIOVERSION N/A 05/21/2023   Procedure: CARDIOVERSION;  Surgeon: Mona Vinie BROCKS, MD;  Location: [facility] INVASIVE CV LAB;  Service: Cardiovascular;  Laterality: N/A;   HERNIA REPAIR     TEE WITHOUT CARDIOVERSION N/A 10/29/2022   Procedure: TRANSESOPHAGEAL ECHOCARDIOGRAM (TEE);  Surgeon: Pietro Redell RAMAN, MD;  Location: [facility] ENDOSCOPY;  Service: Cardiovascular;  Laterality: N/A;     reports that he has never smoked. He has never used smokeless tobacco. He reports that he does not drink alcohol  and does not use drugs.  Family History  Problem Relation Age of Onset   Diabetes Son    Heart disease Other     Prior to Admission medications   Medication Sig Start Date End Date Taking? Authorizing Provider  aluminum  sulfate-calcium  acetate (DOMEBORO) packet Apply 1 packet topically 3 (three) times daily. Mixed it in water. Patient taking differently: Apply 1 packet topically 3 (three) times daily as needed (Itching- mix into water). 12/14/23  Yes [name], [name], MD  amiodarone  (PACERONE ) 200 MG tablet Take 1 tablet (200 mg total) by mouth daily. 09/03/23  Yes Wyn Jackee VEAR Mickey., NP  calcitRIOL  (ROCALTROL ) 0.25 MCG capsule Take 0.25 mcg by mouth every other day.   Yes [provider]  levothyroxine  (SYNTHROID ) 100 MCG tablet Take 1 tablet (100 mcg total) by  mouth daily. Patient taking differently: Take 100 mcg by mouth daily before breakfast. 12/15/23  Yes [name], [name], MD  torsemide  (DEMADEX ) 100 MG tablet Take 1 tablet (100 mg total) by mouth daily. 12/18/23  Yes [name], [name], MD    Physical Exam: Vitals:   01/10/24 1222 01/10/24 1237 01/10/24 1600 01/10/24 1756  BP: 104/78  93/76   Pulse: 89  91   Resp: 14  14   Temp: 97.7  F (36.5 C)   97.9 F (36.6 C)  TempSrc: Oral   Oral  SpO2: 91%  91%   Weight:  62.1 kg    Height:  5' 8 (1.727 m)     Physical Exam Constitutional:      Appearance: He is normal weight.  HENT:     Head: Normocephalic.  Cardiovascular:     Rate and Rhythm: Normal rate and regular rhythm.  Pulmonary:     Effort: Pulmonary effort is normal.     Breath sounds: Normal breath sounds.  Abdominal:     Palpations: Abdomen is soft.  Musculoskeletal:        General: Normal range of motion.  Skin:    General: Skin is warm.     Capillary Refill: Capillary refill takes less than 2 seconds.  Neurological:     General: No focal deficit present.     Mental Status: He is alert.  Psychiatric:        Mood and Affect: Mood normal.        Labs on Admission: I have personally reviewed the patients's labs and imaging studies.  Assessment/Plan Principal Problem:   Hypervolemia   # Shortness of breath most likely secondary to progressively worsening renal failure # Acute on chronic CKD stage V - Acidosis with bicarb of 15 - Hypervolemia - Patient urinating once per day  Plan: Diuresed aggressively with IV Lasix  Nephrology consulted and plans to see patient at [facility] Continue calcitriol   # Paroxysmal A-fib on Eliquis -continue amiodarone .  Patient's home Eliquis  is held due to recent mission for GI bleeding  # hypothyroidism- levothyroxine   # Elevated troponin likely secondary to demand ischemia.  No need to trend further.  Suspicion for ACS is low  Admission status: Inpatient Telemetry Medical  Certification: The appropriate patient status for this patient is INPATIENT. Inpatient status is judged to be reasonable and necessary in order to provide the required intensity of service to ensure the patient's safety. The patient's presenting symptoms, physical exam findings, and initial radiographic and laboratory data in the context of their chronic comorbidities is felt to place  them at high risk for further clinical deterioration. Furthermore, it is not anticipated that the patient will be medically stable for discharge from the hospital within 2 midnights of admission.   * I certify that at the point of admission it is my clinical judgment that the patient will require inpatient hospital care spanning beyond 2 midnights from the point of admission due to high intensity of service, high risk for further deterioration and high frequency of surveillance required.Douglas Lamar Dess MD [facility] If 7PM-7AM, please contact night-coverage [email or web address]  01/10/2024, 5:57 PM

## 2024-01-10 NOTE — ED Provider Notes (Addendum)
 [place] EMERGENCY DEPARTMENT AT [facility] Provider Note   CSN: [id number] Arrival date & time: 01/10/24  1206     History  Chief Complaint  Patient presents with   Shortness of Breath    Douglas Matthews is a 89 y.o. 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  Patient was taken off of blood thinner and GI recommended conservative management.  Patient states that after discharge from the hospital for the last week, he has progressive worsening shortness of breath.  He states that his legs are swollen as well.  patient follows up with Dr. Rayburn from nephrology.  He states that he still urinates and actually urinates about once an hour.  Patient was told that he would not benefit from dialysis.  Denies any fevers or chills.  The history is provided by the patient.       Home Medications Prior to Admission medications   Medication Sig Start Date End Date Taking? Authorizing Provider  aluminum  sulfate-calcium  acetate (DOMEBORO) packet Apply 1 packet topically 3 (three) times daily. Mixed it in water. Patient taking differently: Apply 1 packet topically 3 (three) times daily as needed (Itching). 12/14/23   [name], [name], MD  amiodarone  (PACERONE ) 200 MG tablet Take 1 tablet (200 mg total) by mouth daily. 09/03/23   Wyn Jackee VEAR Mickey., NP  calcitRIOL  (ROCALTROL ) 0.25 MCG capsule Take 0.25 mcg by mouth every other day.    [provider]  COD LIVER OIL PO Take 1 tablet by mouth daily.    [provider]  Garlic 1000 MG CAPS Take 1,000 mg by mouth daily.    [provider]  levothyroxine  (SYNTHROID ) 100 MCG tablet Take 1 tablet (100 mcg total) by mouth daily. 12/15/23   [name], [name], MD  Multiple Vitamin (MULTIVITAMIN) capsule Take 1 capsule by mouth daily.    [provider]  torsemide  (DEMADEX ) 100 MG tablet Take 1 tablet (100  mg total) by mouth daily. 12/18/23   [name], [name], MD      Allergies    Patient has no known allergies.    Review of Systems   Review of Systems  Respiratory:  Positive for shortness of breath.   All other systems reviewed and are negative.   Physical Exam Updated Vital Signs BP 93/76   Pulse 91   Temp 97.7 F (36.5 C) (Oral)   Resp 14   Ht 5' 8 (1.727 m)   Wt 62.1 kg   SpO2 91%   BMI 20.83 kg/m  Physical Exam Vitals and nursing note reviewed.  Constitutional:      Comments: Chronically ill-appearing  HENT:     Head: Normocephalic.  Eyes:     Extraocular Movements: Extraocular movements intact.     Pupils: Pupils are equal, round, and reactive to light.  Cardiovascular:     Rate and Rhythm: Normal rate and regular rhythm.  Pulmonary:     Comments: Crackles bilateral bases Musculoskeletal:     Cervical back: Normal range of motion and neck supple.     Comments: 1+ edema  Skin:    General: Skin is warm.     Capillary Refill: Capillary refill takes less than 2 seconds.  Neurological:     General: No focal deficit present.     Mental Status: He is alert and oriented to person, place, and time.  Psychiatric:  Mood and Affect: Mood normal.        Behavior: Behavior normal.     ED Results / Procedures / Treatments   Labs (all labs ordered are listed, but only abnormal results are displayed) Labs Reviewed  CBC - Abnormal; Notable for the following components:      Result Value   RBC 3.20 (*)    Hemoglobin 8.6 (*)    HCT 28.0 (*)    RDW 16.7 (*)    nRBC 0.4 (*)    All other components within normal limits  COMPREHENSIVE METABOLIC PANEL - Abnormal; Notable for the following components:   CO2 15 (*)    Glucose, Bld 105 (*)    BUN 186 (*)    Creatinine, Ser 8.56 (*)    Calcium  8.3 (*)    Albumin  2.6 (*)    GFR, Estimated 6 (*)    Anion gap 22 (*)    All other components within normal limits  RESP PANEL BY RT-PCR (RSV, FLU A&B, COVID)  RVPGX2   BLOOD GAS, VENOUS  BRAIN NATRIURETIC PEPTIDE  TROPONIN I (HIGH SENSITIVITY)    EKG None  Radiology DG Chest 2 View Result Date: 01/10/2024 CLINICAL DATA:  Shortness of breath. EXAM: CHEST - 2 VIEW COMPARISON:  Chest radiograph dated 12/04/2023. FINDINGS: Small bilateral pleural effusions and bibasilar atelectasis or infiltrate. No pneumothorax. Mild cardiomegaly. Atherosclerotic calcification of the aorta. No acute osseous pathology. Degenerative changes of the spine. IMPRESSION: Small bilateral pleural effusions and bibasilar atelectasis or infiltrate. Electronically Signed   By: Vanetta Chou M.D.   On: 01/10/2024 13:53    Procedures Procedures    Medications Ordered in ED Medications  albuterol  (VENTOLIN  HFA) 108 (90 Base) MCG/ACT inhaler 2 puff (has no administration in time range)  furosemide  (LASIX ) 120 mg in dextrose  5 % 50 mL IVPB (has no administration in time range)    ED Course/ Medical Decision Making/ A&P                                 Medical Decision Making Douglas Matthews is a 89 y.o. male here presenting with shortness of breath.  Patient recently admitted for GI bleed and has baseline CKD.  Patient appears volume overloaded.  Plan to get CBC CMP and BNP and troponin and chest x-ray.  4:36 PM Chest x-ray showed bilateral pleural effusions.  Patient's creatinine went up to 8 from 7.  Patient bicarb is now 15 and the anion gap is 22.  He likely has worsening renal failure.  Patient also takes torsemide  100 mg daily.  I discussed case with Dr. Geralynn from nephrology.  He recommend Lasix  120 mg twice daily and transfer to [facility] and nephrology will see patient as a consult.  He also recommend renal ultrasound as well.  5:08 PM Trop came back at 330.  I think this is demand ischemia from CHF.   Problems Addressed: Acute on chronic combined systolic and diastolic congestive heart failure [facility]): acute illness or injury Acute renal failure superimposed on stage 4  chronic kidney disease, unspecified acute renal failure type [facility]): acute illness or injury  Amount and/or Complexity of Data Reviewed Labs: ordered. Radiology: ordered and independent interpretation performed. Decision-making details documented in ED Course.  Risk Prescription drug management. Decision regarding hospitalization.    Final Clinical Impression(s) / ED Diagnoses Final diagnoses:  None    Rx / DC Orders ED Discharge Orders  None         Patt Alm Macho, MD 01/10/24 1648    Patt Alm Macho, MD 01/10/24 [id number]

## 2024-01-10 NOTE — ED Notes (Signed)
 Patient resting in bed watching TV

## 2024-01-10 NOTE — ED Notes (Signed)
 Patient assisted to bedside commode. Patient educated on the safety to call for help before trying to get up off bedside commode.

## 2024-01-10 NOTE — ED Triage Notes (Signed)
 Pt arrived via POV. C/o SOB that has worsened recently. Pt has afib and was taken off of their Eliquis  recently.   Aox4, NAD

## 2024-01-10 NOTE — ED Notes (Signed)
Patient has a blue top in the main lab °

## 2024-01-11 DIAGNOSIS — N179 Acute kidney failure, unspecified: Secondary | ICD-10-CM | POA: Diagnosis not present

## 2024-01-11 DIAGNOSIS — Z7189 Other specified counseling: Secondary | ICD-10-CM | POA: Diagnosis not present

## 2024-01-11 DIAGNOSIS — Z515 Encounter for palliative care: Secondary | ICD-10-CM | POA: Diagnosis not present

## 2024-01-11 DIAGNOSIS — N185 Chronic kidney disease, stage 5: Secondary | ICD-10-CM | POA: Diagnosis not present

## 2024-01-11 LAB — IRON AND TIBC
Iron: 15 ug/dL — ABNORMAL LOW (ref 45–182)
Saturation Ratios: 5 % — ABNORMAL LOW (ref 17.9–39.5)
TIBC: 308 ug/dL (ref 250–450)
UIBC: 293 ug/dL

## 2024-01-11 LAB — RENAL FUNCTION PANEL
Albumin: 2.5 g/dL — ABNORMAL LOW (ref 3.5–5.0)
Anion gap: 19 — ABNORMAL HIGH (ref 5–15)
BUN: 185 mg/dL — ABNORMAL HIGH (ref 8–23)
CO2: 16 mmol/L — ABNORMAL LOW (ref 22–32)
Calcium: 8 mg/dL — ABNORMAL LOW (ref 8.9–10.3)
Chloride: 104 mmol/L (ref 98–111)
Creatinine, Ser: 8.91 mg/dL — ABNORMAL HIGH (ref 0.61–1.24)
GFR, Estimated: 5 mL/min — ABNORMAL LOW (ref >60)
Glucose, Bld: 95 mg/dL (ref 70–99)
Phosphorus: 10.3 mg/dL — ABNORMAL HIGH (ref 2.5–4.6)
Potassium: 4.8 mmol/L (ref 3.5–5.1)
Sodium: 139 mmol/L (ref 135–145)

## 2024-01-11 LAB — FERRITIN: Ferritin: 71 ng/mL (ref 24–336)

## 2024-01-11 NOTE — ED Notes (Signed)
 Report given to [place], [place] on 46M at [facility]

## 2024-01-11 NOTE — Hospital Course (Addendum)
Douglas Matthews is a 89 y.o. male with medical history significant of atrial fibrillation,, BPH, CKD stage V, hypertension, hyperlipidemia, hypothyroidism presented to hospital with progressive shortness of breath with the swelling of the bilateral lower extremities.  Has been seen by nephrology as outpatient.  In the ED patient was afebrile and hemodynamically stable.  Labs showed creatinine of 8.5.  Chest x-ray with small pleural effusion without pulmonary edema.  Patient received 120 mg of IV Lasix in the ED and was admitted hospital for further evaluation and treatment.   Shortness of breath most likely secondary to progressively worsening renal failure  Acute on chronic CKD stage V Had metabolic acidosis with bicarb of 15 hypovolemia on presentation.  On aggressive diuresis with IV Lasix.  Nephrology on board.  Continue strict intake and output charting Daily weights.  As per nephrology patient is not a good candidate for hemodialysis.  Palliative care has been consulted for goals of care.    Paroxysmal A-fib continue amiodarone. Eliquis on hold due to recent GI bleed.    hypothyroidism-continue levothyroxine   Elevated troponin likely secondary to demand ischemia.  No chest pain or EKG changes.

## 2024-01-11 NOTE — ED Notes (Signed)
 Patient resting in bed breathing eyes closed on monitor

## 2024-01-11 NOTE — ED Notes (Signed)
 Patient resting in bed breathing eyes closed

## 2024-01-11 NOTE — ED Notes (Signed)
 Patient resting in bed watching TV at this time on monitor

## 2024-01-11 NOTE — Consult Note (Signed)
 Palliative Medicine Inpatient Consult Note  Consulting Provider: Chucky Craver, MD   Reason for consult:   Palliative Care Consult Services Palliative Medicine Consult  Reason for Consult? pt is at end-stage renal failure, not candidate for dialysis due to severe heart disease/ CHF/ poor systolic function, please assist w/ GOC   01/11/2024  HPI:  Per intake H&P -->  Douglas Matthews is a 89 y.o. male with medical history significant of A-fib, BPH, CKD stage V, hypertension, hyperlipidemia, hypothyroidism who presented to the emergency department due to progressively worsening shortness of breath.   The Palliative care team has asked for additional goals of care conversations.   Clinical Assessment/Goals of Care:  *Please note that this is a verbal dictation therefore any spelling or grammatical errors are due to the "Dragon Medical One" system interpretation.  I have reviewed medical records including EPIC notes, labs and imaging, received report from bedside RN, assessed the patient.    I met with Douglas Matthews and his wife, Douglas Matthews to further discuss diagnosis prognosis, GOC, EOL wishes, disposition and options.   I introduced Palliative Medicine as specialized medical care for people living with serious illness. It focuses on providing relief from the symptoms and stress of a serious illness. The goal is to improve quality of life for both the patient and the family.  Medical History Review and Understanding:  Arvon and I reviewed his past medical history of hypertension, hyperlipidemia, biventricular heart failure, atrial fibrillation, anemia, stage IV chronic kidney disease, and BPH.   Social History:  Douglas Matthews shares with me that he is from [place]  near [place]. He and his wife Douglas Matthews have been married for 56 years though together for 60 years. They share 2 sons and 3 grandchildren. Douglas Matthews worked throughout his career at the [employer]. Douglas Matthews enjoys doing yard  work as well as servicing on his cars. He enjoys sports and being social. He is a man of faith and practices at [facility]. Hardie's wife is a Research scientist (medical).   Functional and Nutritional State:  Douglas Matthews has required more help over the past few months as he is no longer continent. His wife helps him with clothing though he is able to participate with an aid in bathing. He requires a walker to mobilize.   Advance Directives:  A detailed discussion was had today regarding advanced directives.  Patients spouse shares that they have advanced directives and she plans to bring in a copy in the oncoming days.   Code Status: Concepts specific to code status, artifical feeding and hydration, continued IV antibiotics and rehospitalization was had.  The difference between a aggressive medical intervention path  and a palliative comfort care path for this patient at this time was had.   Milt is an established DNAR/DNI code status.  I completed a MOST form today. The patient and family outlined their wishes for the following treatment decisions:  Cardiopulmonary Resuscitation: Do Not Attempt Resuscitation (DNR/No CPR)  Medical Interventions: Limited Additional Interventions: Use medical treatment, IV fluids and cardiac monitoring as indicated, DO NOT USE intubation or mechanical ventilation. May consider use of less invasive airway support such as BiPAP or CPAP. Also provide comfort measures. Transfer to the hospital if indicated. Avoid intensive care.   Antibiotics: Determine use of limitation of antibiotics when infection occurs  IV Fluids: IV fluids for a defined trial period  Feeding Tube: No feeding tube   Discussion:  We discussed Douglas Matthews' present health in the setting  of his heart failure and chronic kidney disease. We reviewed the poor kidney function that he has at this time. He is understanding that he would not be a good dialysis candidate due to frailty and co-morbidity burden.    We discussed options moving forward should Douglas Matthews' health continue to decline. I did approach the topic of hospice support. I described hospice as a service for patients who have a life expectancy of 6 months or less. The goal of hospice is the preservation of dignity and quality at the end phases of life. Under hospice care, the focus changes from curative to symptom relief.   We reviewed that at this time, patients wife does not want to focus on "death" she wants the dialogue to remain positive. I provided insights on how hospice may be of utility at this point in patients illness. Douglas Matthews shared with me their beliefs and the only predictor of time is a higher power. I was able to listen to her as she spoke.  Plan at this time will be to continue present measures. Douglas Matthews would like to continue [facility].   Discussed the importance of continued conversation with family and their  medical providers regarding overall plan of care and treatment options, ensuring decisions are within the context of the patients values and GOCs.  Decision Maker: Douglas Matthews (Spouse): [phone] (Mobile)   SUMMARY OF RECOMMENDATIONS   DNAR/DNI  MOST/ DNR Form Completed, paper copy placed onto the chart electric copy can be found in [employer]  Patients wife plans to bring in a copy of their living will'  Discussed patients disease burden and poor overall prognosis  Broached the topic of hospice though patients spouse would like to continue [facility] services at this time through [facility]  Patient previously established as an OP with [facility]iative services, I will see if they can re-engage  Ongoing PMT support  Code Status/Advance Care Planning: DNAR/DNI  Palliative Prophylaxis:  Aspiration, Bowel Regimen, Delirium Protocol, Frequent Pain Assessment, Oral Care, Palliative Wound Care, and Turn Reposition  Additional Recommendations (Limitations, Scope, Preferences): Continue current  care  Psycho-social/Spiritual:  Desire for further Chaplaincy support: Yes Additional Recommendations: Education on progressive kidney failure   Prognosis: Limited overall, hospice is a reasonable consideration.   Discharge Planning: Discharge patient to home once medically optimized.  Vitals:   01/11/24 0745 01/11/24 0847  BP: 103/72 100/72  Pulse: 89 88  Resp: 12 19  Temp:  (!) 97.5 F (36.4 C)  SpO2: 100% 100%    Intake/Output Summary (Last 24 hours) at 01/11/2024 1600 Last data filed at 01/11/2024 0848 Gross per 24 hour  Intake 181.15 ml  Output --  Net 181.15 ml   Last Weight  Most recent update: 01/10/2024 12:37 PM    Weight  62.1 kg (137 lb)            Gen: Elderly African-American male in no acute distress HEENT: moist mucous membranes CV: Regular rate and irregular rhythm PULM: On 2LPM [employer], breathing is even and nonlabored ABD: soft/nontender EXT: No edema Neuro: Alert and oriented x3  PPS: 50%   This conversation/these recommendations were discussed with patient primary care team, Dr. Efrain Grant  MDM: High ______________________________________________________ [place] Palliative Medicine Team Team Cell Phone: [phone] Please utilize secure chat with additional questions, if there is no response within 30 minutes please call the above phone number  Palliative Medicine Team providers are available by phone from 7am to 7pm daily and can be reached through the  team cell phone.  Should this patient require assistance outside of these hours, please call the patient's attending physician.

## 2024-01-11 NOTE — Progress Notes (Signed)
 Patient ID: Douglas Matthews, male   DOB: 21-Jun-1935, 89 y.o.   MRN: [id number] Douglas Matthews Progress Note   Assessment/ Plan:   1.  Chronic kidney disease stage V: Progressive and not a candidate for renal replacement therapy/dialysis with underlying severe heart disease.  BUN unchanged but creatinine slightly higher overnight status post furosemide  but without any uremic signs or symptoms.  He does not have any acute electrolyte abnormality prompting intervention.  Agree with palliative care consultation and possibly hospice as I anticipate mortality in <6 months with his current renal function.  At this time, continue furosemide  120 mg IV twice daily. 2.  Shortness of breath: Appears to be from volume excess in this patient with HFrEF.  Reportedly improved overnight status post diuretics and will wean oxygen as tolerated. 3.  Anion gap metabolic acidosis: Secondary to combination of acute kidney injury/CHF exacerbation and underlying chronic kidney disease.  Monitor on sodium bicarbonate . 4.  Anemia: This is possibly compounding his congestive heart failure, previously checked iron stores from January were borderline and I will recheck these today to determine need for intravenous iron.  Subjective:   Reports to be feeling somewhat better and inquires how long he will be on oxygen supplementation.   Objective:   BP 100/72 (BP Location: Left Arm)   Pulse 89   Temp (!) 97.5 F (36.4 C) (Axillary)   Resp 12   Ht 5\' 8"  (1.727 m)   Wt 62.1 kg   SpO2 100%   BMI 20.83 kg/m   Intake/Output Summary (Last 24 hours) at 01/11/2024 1012 Last data filed at 01/10/2024 1855 Gross per 24 hour  Intake 61.15 ml  Output --  Net 61.15 ml   Weight change:   Physical Exam: Gen: Elderly man who is resting comfortably in bed.  On oxygen via [place] CVS: Pulse regular rhythm, normal rate, S1 and S2 normal Resp: Clear to auscultation bilaterally without rales/rhonchi Abd: Soft, flat, nontender,  bowel sounds normal Ext: Trace ankle edema  Imaging: US  Renal Result Date: 01/10/2024 CLINICAL DATA:  Acute renal insufficiency EXAM: RENAL / URINARY TRACT ULTRASOUND COMPLETE COMPARISON:  None Available. FINDINGS: Right Kidney: Renal measurements: 10.3 x 3.7 x 6.0 cm = volume: 119.1 mL. There is diffuse increased renal cortical echotexture consistent with medical renal disease. Loss of corticomedullary differentiation. No hydronephrosis or renal mass. Left Kidney: Renal measurements: 9.6 x 5.2 x 5.7 cm = volume: 148.8 mL. Diffuse increased renal cortical echotexture consistent with medical renal disease. Loss of normal corticomedullary differentiation. Simple renal cortical cysts, measuring up to 3.2 cm within the upper pole left kidney. No hydronephrosis. Bladder: Appears normal for degree of bladder distention. Other: Incidental note is made of small volume ascites and small bilateral pleural effusions. IMPRESSION: 1. Bilateral renal cortical atrophy, with diffuse increased renal cortical echotexture consistent with medical renal disease. 2. Incidental small bilateral pleural effusions and small volume ascites. Electronically Signed   By: Bobbye Burrow M.D.   On: 01/10/2024 17:20   DG Chest 2 View Result Date: 01/10/2024 CLINICAL DATA:  Shortness of breath. EXAM: CHEST - 2 VIEW COMPARISON:  Chest radiograph dated 12/04/2023. FINDINGS: Small bilateral pleural effusions and bibasilar atelectasis or infiltrate. No pneumothorax. Mild cardiomegaly. Atherosclerotic calcification of the aorta. No acute osseous pathology. Degenerative changes of the spine. IMPRESSION: Small bilateral pleural effusions and bibasilar atelectasis or infiltrate. Electronically Signed   By: Angus Bark M.D.   On: 01/10/2024 13:53    Labs: [employer]  01/05/24 1211 01/10/24 1243 01/11/24 0445  NA 143 143 139  K 4.5 4.8 4.8  CL 105 106 104  CO2 20 15* 16*  GLUCOSE 81 105* 95  BUN 166* 186* 185*  CREATININE  7.87* 8.56* 8.91*  CALCIUM  8.0* 8.3* 8.0*  PHOS  --   --  10.3*   CBC Recent Labs  Lab 01/05/24 1211 01/10/24 1243  WBC 5.3 4.9  HGB 8.3 Repeated and verified X2.* 8.6*  HCT 25.0* 28.0*  MCV 87.7 87.5  PLT 283.0 317    Medications:     amiodarone   200 mg Oral Daily   calcitRIOL   0.25 mcg Oral QODAY   heparin   5,000 Units Subcutaneous Q8H   levothyroxine   100 mcg Oral Q0600   sodium bicarbonate   1,300 mg Oral BID   Douglas Dally, MD 01/11/2024, 10:12 AM

## 2024-01-11 NOTE — ED Notes (Signed)
 Patient resting in bed watching TV at this time.

## 2024-01-11 NOTE — ED Notes (Signed)
 Patient lasix  in dextrose  per [facility] will be tubed per lab

## 2024-01-11 NOTE — ED Notes (Signed)
 Provided patient with warm blankets. Patient in room watching TV

## 2024-01-11 NOTE — Progress Notes (Signed)
 PROGRESS NOTE    Douglas Matthews  NWG:[id number] DOB: 07/28/1935 DOA: 01/10/2024 PCP: [place], [name], MD   Brief Narrative:  Douglas Matthews is a 89 y.o. male with medical history significant of A-fib, BPH, CKD stage V, hypertension, hyperlipidemia, hypothyroidism who presented to the emergency department due to progressively worsening shortness of breath.   Assessment & Plan:   Principal Problem:   Hypervolemia  Shortness of breath secondary to progressively worsening renal failure and volume overload Acute on chronic CKD stage V - Acidosis with bicarb of 15 - Hypervolemia - Patient urinating once per day at baseline - Nephrology following - plan to transfer to [facility] as soon as bed is available - Continue diuresis in the interim per Nephro - Previously noted to be a poor HD candidate - Continue calcitriol    Paroxysmal A-fib on Eliquis  - Rate controlled on amiodarone .   - Eliquis  previously held due to recent admission for GI bleeding   Hypothyroidism- levothyroxine    Elevated troponin likely secondary to demand ischemia, type 2 NSTEMI   - ACS suspicion low - likely provoked by above. No need to trend further. - Follow clincally  DVT prophylaxis: heparin  injection 5,000 Units Start: 01/10/24 2200 SCDs Start: 01/10/24 1736   Code Status:   Code Status: Full Code  Family Communication: None present  Status is: Inpt  Dispo: The patient is from: Home              Anticipated d/c is to: TBD              Anticipated d/c date is: TBD              Patient currently NOT medically stable for discharge  Consultants:  Nephrology  Procedures:  None  Antimicrobials:  None   Subjective: No acute issues/events noted overnight, tolerating diuresis well, denies nausea vomiting headache fevers chills or chest pain.  Objective: Vitals:   01/11/24 0215 01/11/24 0315 01/11/24 0415 01/11/24 0545  BP: 104/80 110/79 105/72 121/76  Pulse:  89 88 87  Resp:  15 16 19 14   Temp:   97.8 F (36.6 C)   TempSrc:      SpO2:  98% 98% 100%  Weight:      Height:        Intake/Output Summary (Last 24 hours) at 01/11/2024 0748 Last data filed at 01/10/2024 1855 Gross per 24 hour  Intake 61.15 ml  Output --  Net 61.15 ml   Filed Weights   01/10/24 1237  Weight: 62.1 kg    Examination:  General exam: Appears calm and comfortable  Respiratory system: Clear to auscultation. Respiratory effort normal. Cardiovascular system: S1 & S2 heard, RRR. No JVD, murmurs, rubs, gallops or clicks. No pedal edema. Gastrointestinal system: Abdomen is nondistended, soft and nontender. No organomegaly or masses felt. Normal bowel sounds heard. Central nervous system: Alert and oriented. No focal neurological deficits. Extremities: Symmetric 5 x 5 power. Skin: No rashes, lesions noted  Data Reviewed: I have personally reviewed following labs and imaging studies  CBC: Recent Labs  Lab 01/05/24 1211 01/10/24 1243  WBC 5.3 4.9  HGB 8.3 Repeated and verified X2.* 8.6*  HCT 25.0* 28.0*  MCV 87.7 87.5  PLT 283.0 317   Basic Metabolic Panel: Recent Labs  Lab 01/05/24 1211 01/10/24 1243 01/11/24 0445  NA 143 143 139  K 4.5 4.8 4.8  CL 105 106 104  CO2 20 15* 16*  GLUCOSE 81 105* 95  BUN 166* 186* 185*  CREATININE 7.87* 8.56* 8.91*  CALCIUM  8.0* 8.3* 8.0*  PHOS  --   --  10.3*   GFR: Estimated Creatinine Clearance: 5.2 mL/min (A) (by C-G formula based on SCr of 8.91 mg/dL (H)). Liver Function Tests: Recent Labs  Lab 01/10/24 1243 01/11/24 0445  AST 36  --   ALT 33  --   ALKPHOS 93  --   BILITOT 0.9  --   PROT 7.4  --   ALBUMIN  2.6* 2.5*   No results for input(s): "LIPASE", "AMYLASE" in the last 168 hours. No results for input(s): "AMMONIA" in the last 168 hours. Coagulation Profile: No results for input(s): "INR", "PROTIME" in the last 168 hours. Cardiac Enzymes: No results for input(s): "CKTOTAL", "CKMB", "CKMBINDEX", "TROPONINI" in the  last 168 hours. BNP (last 3 results) Recent Labs    02/03/23 1501  PROBNP 1,760.0*   HbA1C: No results for input(s): "HGBA1C" in the last 72 hours. CBG: No results for input(s): "GLUCAP" in the last 168 hours. Lipid Profile: No results for input(s): "CHOL", "HDL", "LDLCALC", "TRIG", "CHOLHDL", "LDLDIRECT" in the last 72 hours. Thyroid  Function Tests: No results for input(s): "TSH", "T4TOTAL", "FREET4", "T3FREE", "THYROIDAB" in the last 72 hours. Anemia Panel: No results for input(s): "VITAMINB12", "FOLATE", "FERRITIN", "TIBC", "IRON", "RETICCTPCT" in the last 72 hours. Sepsis Labs: No results for input(s): "PROCALCITON", "LATICACIDVEN" in the last 168 hours.  Recent Results (from the past 240 hours)  Resp panel by RT-PCR (RSV, Flu A&B, Covid) Anterior Nasal Swab     Status: None   Collection Time: 01/10/24 12:59 PM   Specimen: Anterior Nasal Swab  Result Value Ref Range Status   SARS Coronavirus 2 by RT PCR NEGATIVE NEGATIVE Final    Comment: (NOTE) SARS-CoV-2 target nucleic acids are NOT DETECTED.  The SARS-CoV-2 RNA is generally detectable in upper respiratory specimens during the acute phase of infection. The lowest concentration of SARS-CoV-2 viral copies this assay can detect is 138 copies/mL. A negative result does not preclude SARS-Cov-2 infection and should not be used as the sole basis for treatment or other patient management decisions. A negative result may occur with  improper specimen collection/handling, submission of specimen other than nasopharyngeal swab, presence of viral mutation(s) within the areas targeted by this assay, and inadequate number of viral copies(<138 copies/mL). A negative result must be combined with clinical observations, patient history, and epidemiological information. The expected result is Negative.  Fact Sheet for Patients:  BloggerCourse.com  Fact Sheet for Healthcare Providers:   SeriousBroker.it  This test is no t yet approved or cleared by the [place]  FDA and  has been authorized for detection and/or diagnosis of SARS-CoV-2 by FDA under an Emergency Use Authorization (EUA). This EUA will remain  in effect (meaning this test can be used) for the duration of the COVID-19 declaration under Section 564(b)(1) of the Act, 21 U.S.C.section 360bbb-3(b)(1), unless the authorization is terminated  or revoked sooner.       Influenza A by PCR NEGATIVE NEGATIVE Final   Influenza B by PCR NEGATIVE NEGATIVE Final    Comment: (NOTE) The Xpert Xpress SARS-CoV-2/FLU/RSV plus assay is intended as an aid in the diagnosis of influenza from Nasopharyngeal swab specimens and should not be used as a sole basis for treatment. Nasal washings and aspirates are unacceptable for Xpert Xpress SARS-CoV-2/FLU/RSV testing.  Fact Sheet for Patients: BloggerCourse.com  Fact Sheet for Healthcare Providers: SeriousBroker.it  This test is not yet approved or cleared by the [place] FDA and has been authorized for detection and/or diagnosis of SARS-CoV-2 by FDA under an Emergency Use Authorization (EUA). This EUA will remain in effect (meaning this test can be used) for the duration of the COVID-19 declaration under Section 564(b)(1) of the Act, 21 U.S.C. section 360bbb-3(b)(1), unless the authorization is terminated or revoked.     Resp Syncytial Virus by PCR NEGATIVE NEGATIVE Final    Comment: (NOTE) Fact Sheet for Patients: BloggerCourse.com  Fact Sheet for Healthcare Providers: SeriousBroker.it  This test is not yet approved or cleared by the [place]  FDA and has been authorized for detection and/or diagnosis of SARS-CoV-2 by FDA under an Emergency Use Authorization (EUA). This EUA will remain in effect (meaning this test can be used) for  the duration of the COVID-19 declaration under Section 564(b)(1) of the Act, 21 U.S.C. section 360bbb-3(b)(1), unless the authorization is terminated or revoked.  Performed at [facility], [address] [place], [place] [address]          Radiology Studies: US  Renal Result Date: 01/10/2024 CLINICAL DATA:  Acute renal insufficiency EXAM: RENAL / URINARY TRACT ULTRASOUND COMPLETE COMPARISON:  None Available. FINDINGS: Right Kidney: Renal measurements: 10.3 x 3.7 x 6.0 cm = volume: 119.1 mL. There is diffuse increased renal cortical echotexture consistent with medical renal disease. Loss of corticomedullary differentiation. No hydronephrosis or renal mass. Left Kidney: Renal measurements: 9.6 x 5.2 x 5.7 cm = volume: 148.8 mL. Diffuse increased renal cortical echotexture consistent with medical renal disease. Loss of normal corticomedullary differentiation. Simple renal cortical cysts, measuring up to 3.2 cm within the upper pole left kidney. No hydronephrosis. Bladder: Appears normal for degree of bladder distention. Other: Incidental note is made of small volume ascites and small bilateral pleural effusions. IMPRESSION: 1. Bilateral renal cortical atrophy, with diffuse increased renal cortical echotexture consistent with medical renal disease. 2. Incidental small bilateral pleural effusions and small volume ascites. Electronically Signed   By: Bobbye Burrow M.D.   On: 01/10/2024 17:20   DG Chest 2 View Result Date: 01/10/2024 CLINICAL DATA:  Shortness of breath. EXAM: CHEST - 2 VIEW COMPARISON:  Chest radiograph dated 12/04/2023. FINDINGS: Small bilateral pleural effusions and bibasilar atelectasis or infiltrate. No pneumothorax. Mild cardiomegaly. Atherosclerotic calcification of the aorta. No acute osseous pathology. Degenerative changes of the spine. IMPRESSION: Small bilateral pleural effusions and bibasilar atelectasis or infiltrate. Electronically Signed   By: Angus Bark M.D.   On: 01/10/2024 13:53        Scheduled Meds:  amiodarone   200 mg Oral Daily   calcitRIOL   0.25 mcg Oral QODAY   heparin   5,000 Units Subcutaneous Q8H   levothyroxine   100 mcg Oral Q0600   sodium bicarbonate   1,300 mg Oral BID   Continuous Infusions:  furosemide  120 mg (01/11/24 0721)     LOS: 1 day    Time spent:    Haydee Lipa, DO [facility]  If 7PM-7AM, please contact night-coverage [email or web address]  01/11/2024, 7:48 AM

## 2024-01-11 NOTE — Plan of Care (Signed)

## 2024-01-12 ENCOUNTER — Encounter (HOSPITAL_COMMUNITY): Payer: Self-pay | Admitting: Internal Medicine

## 2024-01-12 DIAGNOSIS — E8770 Fluid overload, unspecified: Secondary | ICD-10-CM

## 2024-01-12 DIAGNOSIS — Z7189 Other specified counseling: Secondary | ICD-10-CM | POA: Diagnosis not present

## 2024-01-12 DIAGNOSIS — Z515 Encounter for palliative care: Secondary | ICD-10-CM | POA: Diagnosis not present

## 2024-01-12 LAB — RENAL FUNCTION PANEL
Albumin: 2.4 g/dL — ABNORMAL LOW (ref 3.5–5.0)
Anion gap: 20 — ABNORMAL HIGH (ref 5–15)
BUN: 192 mg/dL — ABNORMAL HIGH (ref 8–23)
CO2: 17 mmol/L — ABNORMAL LOW (ref 22–32)
Calcium: 8.3 mg/dL — ABNORMAL LOW (ref 8.9–10.3)
Chloride: 104 mmol/L (ref 98–111)
Creatinine, Ser: 9.23 mg/dL — ABNORMAL HIGH (ref 0.61–1.24)
GFR, Estimated: 5 mL/min — ABNORMAL LOW (ref >60)
Glucose, Bld: 96 mg/dL (ref 70–99)
Phosphorus: 11 mg/dL — ABNORMAL HIGH (ref 2.5–4.6)
Potassium: 4.8 mmol/L (ref 3.5–5.1)
Sodium: 141 mmol/L (ref 135–145)

## 2024-01-12 LAB — CBC
HCT: 25.9 % — ABNORMAL LOW (ref 39.0–52.0)
Hemoglobin: 8.4 g/dL — ABNORMAL LOW (ref 13.0–17.0)
MCH: 27 pg (ref 26.0–34.0)
MCHC: 32.4 g/dL (ref 30.0–36.0)
MCV: 83.3 fL (ref 80.0–100.0)
Platelets: 321 10*3/uL (ref 150–400)
RBC: 3.11 MIL/uL — ABNORMAL LOW (ref 4.22–5.81)
RDW: 16.3 % — ABNORMAL HIGH (ref 11.5–15.5)
WBC: 5 10*3/uL (ref 4.0–10.5)
nRBC: 0.4 % — ABNORMAL HIGH (ref 0.0–0.2)

## 2024-01-12 LAB — GLUCOSE, CAPILLARY
Glucose-Capillary: 83 mg/dL (ref 70–99)
Glucose-Capillary: 91 mg/dL (ref 70–99)
Glucose-Capillary: 90 mg/dL (ref 70–99)
Glucose-Capillary: 330 mg/dL — ABNORMAL HIGH (ref 70–99)

## 2024-01-12 LAB — MAGNESIUM: Magnesium: 2.2 mg/dL (ref 1.7–2.4)

## 2024-01-12 MED ORDER — TORSEMIDE 20 MG PO TABS
100.0000 mg | ORAL_TABLET | Freq: Every day | ORAL | Status: DC
  Administered 2024-01-12 – 2024-01-13 (×2): 100 mg via ORAL
  Filled 2024-01-12 (×2): qty 5

## 2024-01-12 NOTE — Progress Notes (Addendum)
PROGRESS NOTE  Douglas Matthews NFA:[id number] DOB: September 01, 1935 DOA: 01/10/2024 PCP: [place], [name], MD   LOS: 2 days   Brief narrative:   Douglas Matthews is a 89 y.o. male with medical history significant of atrial fibrillation,, BPH, CKD stage V, hypertension, hyperlipidemia, hypothyroidism presented to hospital with progressive shortness of breath with the swelling of the bilateral lower extremities.  Has been seen by nephrology as outpatient.  In the ED, patient was afebrile and hemodynamically stable.  Labs showed creatinine of 8.5.  Chest x-ray with small pleural effusion without pulmonary edema.  Patient received 120 mg of IV Lasix in the ED and was admitted hospital for further evaluation and treatment.   Assessment/Plan: Principal Problem:   Hypervolemia   Shortness of breath most likely secondary to progressively worsening renal failure  Acute on chronic CKD stage V Had metabolic acidosis with bicarb of 15 hypovolemia on presentation.  On aggressive diuresis with IV Lasix.  Nephrology on board.  Continue strict intake and output charting Daily weights.  As per nephrology patient is not a good candidate for hemodialysis.  Palliative care has been consulted for goals of care.  MOST form DNR form has been completed.      Paroxysmal A-fib continue amiodarone. Eliquis on hold due to recent GI bleed.    hypothyroidism-continue levothyroxine   Elevated troponin likely secondary to demand ischemia.  No chest pain or EKG changes.   DVT prophylaxis: heparin injection 5,000 Units Start: 01/10/24 2200 SCDs Start: 01/10/24 1736   Disposition: Likely home with home health in 1 to 2 days  Status is: Inpatient Remains inpatient appropriate because: IV diuretic, pending disposition plan,    Code Status:     Code Status: Limited: Do not attempt resuscitation (DNR) -DNR-LIMITED -Do Not Intubate/DNI   Family Communication: Spoke with the patient's daughter on the phone who was initially  interested in hospice.  Please talk to hospice team and it looks like family has refused hospice again.  Will need to figure out appropriate disposition plan.  Consultants: Nephrology Palliative care  Procedures: None  Anti-infectives:  None  Anti-infectives (From admission, onward)    None        Subjective: Today, patient was seen and examined at bedside.  Patient inquires about going home.  Denies any pain nausea vomiting.  Has some shortness of breath.  Objective: Vitals:   01/11/24 2207 01/12/24 0841  BP: 92/76 111/74  Pulse: 80 88  Resp: 18 18  Temp: 98.1 F (36.7 C) 98.2 F (36.8 C)  SpO2: 99% 100%    Intake/Output Summary (Last 24 hours) at 01/12/2024 1530 Last data filed at 01/12/2024 1244 Gross per 24 hour  Intake 1184.33 ml  Output 200 ml  Net 984.33 ml   Filed Weights   01/10/24 1237 01/12/24 0205  Weight: 62.1 kg 62 kg   Body mass index is 20.78 kg/m.   Physical Exam:  GENERAL: Patient is alert awake and oriented. Not in obvious distress.  Elderly male, Communicative HENT: No scleral pallor or icterus. Pupils equally reactive to light. Oral mucosa is moist NECK: is supple, no gross swelling noted. CHEST: Clear to auscultation. No crackles or wheezes.  Diminished breath sounds bilaterally. CVS: S1 and S2 heard, no murmur. Regular rate and rhythm.  ABDOMEN: Soft, non-tender, bowel sounds are present. EXTREMITIES: Trace ankle edema. CNS: Cranial nerves are intact. No focal motor deficits. SKIN: warm and dry without rashes.  Data Review: I have personally reviewed the following laboratory  data and studies,  CBC: Recent Labs  Lab 01/10/24 1243 01/12/24 0442  WBC 4.9 5.0  HGB 8.6* 8.4*  HCT 28.0* 25.9*  MCV 87.5 83.3  PLT 317 321   Basic Metabolic Panel: Recent Labs  Lab 01/10/24 1243 01/11/24 0445 01/12/24 0442  NA 143 139 141  K 4.8 4.8 4.8  CL 106 104 104  CO2 15* 16* 17*  GLUCOSE 105* 95 96  BUN 186* 185* 192*  CREATININE  8.56* 8.91* 9.23*  CALCIUM 8.3* 8.0* 8.3*  MG  --   --  2.2  PHOS  --  10.3* 11.0*   Liver Function Tests: Recent Labs  Lab 01/10/24 1243 01/11/24 0445 01/12/24 0442  AST 36  --   --   ALT 33  --   --   ALKPHOS 93  --   --   BILITOT 0.9  --   --   PROT 7.4  --   --   ALBUMIN 2.6* 2.5* 2.4*   No results for input(s): "LIPASE", "AMYLASE" in the last 168 hours. No results for input(s): "AMMONIA" in the last 168 hours. Cardiac Enzymes: No results for input(s): "CKTOTAL", "CKMB", "CKMBINDEX", "TROPONINI" in the last 168 hours. BNP (last 3 results) Recent Labs    02/03/23 1936 12/04/23 1757 01/10/24 1243  BNP 878.0* 1,043.7* 1,141.1*    ProBNP (last 3 results) Recent Labs    02/03/23 1501  PROBNP 1,760.0*    CBG: Recent Labs  Lab 01/12/24 0720 01/12/24 1120  GLUCAP 83 91   Recent Results (from the past 240 hours)  Resp panel by RT-PCR (RSV, Flu A&B, Covid) Anterior Nasal Swab     Status: None   Collection Time: 01/10/24 12:59 PM   Specimen: Anterior Nasal Swab  Result Value Ref Range Status   SARS Coronavirus 2 by RT PCR NEGATIVE NEGATIVE Final    Comment: (NOTE) SARS-CoV-2 target nucleic acids are NOT DETECTED.  The SARS-CoV-2 RNA is generally detectable in upper respiratory specimens during the acute phase of infection. The lowest concentration of SARS-CoV-2 viral copies this assay can detect is 138 copies/mL. A negative result does not preclude SARS-Cov-2 infection and should not be used as the sole basis for treatment or other patient management decisions. A negative result may occur with  improper specimen collection/handling, submission of specimen other than nasopharyngeal swab, presence of viral mutation(s) within the areas targeted by this assay, and inadequate number of viral copies(<138 copies/mL). A negative result must be combined with clinical observations, patient history, and epidemiological information. The expected result is  Negative.  Fact Sheet for Patients:  BloggerCourse.com  Fact Sheet for Healthcare Providers:  SeriousBroker.it  This test is no t yet approved or cleared by the [place] FDA and  has been authorized for detection and/or diagnosis of SARS-CoV-2 by FDA under an Emergency Use Authorization (EUA). This EUA will remain  in effect (meaning this test can be used) for the duration of the COVID-19 declaration under Section 564(b)(1) of the Act, 21 U.S.C.section 360bbb-3(b)(1), unless the authorization is terminated  or revoked sooner.       Influenza A by PCR NEGATIVE NEGATIVE Final   Influenza B by PCR NEGATIVE NEGATIVE Final    Comment: (NOTE) The Xpert Xpress SARS-CoV-2/FLU/RSV plus assay is intended as an aid in the diagnosis of influenza from Nasopharyngeal swab specimens and should not be used as a sole basis for treatment. Nasal washings and aspirates are unacceptable for Xpert Xpress SARS-CoV-2/FLU/RSV testing.  Fact Sheet for  Patients: BloggerCourse.com  Fact Sheet for Healthcare Providers: SeriousBroker.it  This test is not yet approved or cleared by the [place] FDA and has been authorized for detection and/or diagnosis of SARS-CoV-2 by FDA under an Emergency Use Authorization (EUA). This EUA will remain in effect (meaning this test can be used) for the duration of the COVID-19 declaration under Section 564(b)(1) of the Act, 21 U.S.C. section 360bbb-3(b)(1), unless the authorization is terminated or revoked.     Resp Syncytial Virus by PCR NEGATIVE NEGATIVE Final    Comment: (NOTE) Fact Sheet for Patients: BloggerCourse.com  Fact Sheet for Healthcare Providers: SeriousBroker.it  This test is not yet approved or cleared by the [place] FDA and has been authorized for detection and/or diagnosis of  SARS-CoV-2 by FDA under an Emergency Use Authorization (EUA). This EUA will remain in effect (meaning this test can be used) for the duration of the COVID-19 declaration under Section 564(b)(1) of the Act, 21 U.S.C. section 360bbb-3(b)(1), unless the authorization is terminated or revoked.  Performed at [facility], [address], [place], [place] [address]      Studies: US Renal Result Date: 01/10/2024 CLINICAL DATA:  Acute renal insufficiency EXAM: RENAL / URINARY TRACT ULTRASOUND COMPLETE COMPARISON:  None Available. FINDINGS: Right Kidney: Renal measurements: 10.3 x 3.7 x 6.0 cm = volume: 119.1 mL. There is diffuse increased renal cortical echotexture consistent with medical renal disease. Loss of corticomedullary differentiation. No hydronephrosis or renal mass. Left Kidney: Renal measurements: 9.6 x 5.2 x 5.7 cm = volume: 148.8 mL. Diffuse increased renal cortical echotexture consistent with medical renal disease. Loss of normal corticomedullary differentiation. Simple renal cortical cysts, measuring up to 3.2 cm within the upper pole left kidney. No hydronephrosis. Bladder: Appears normal for degree of bladder distention. Other: Incidental note is made of small volume ascites and small bilateral pleural effusions. IMPRESSION: 1. Bilateral renal cortical atrophy, with diffuse increased renal cortical echotexture consistent with medical renal disease. 2. Incidental small bilateral pleural effusions and small volume ascites. Electronically Signed   By: Sharlet Salina M.D.   On: 01/10/2024 17:20      Joycelyn Das, MD  [facility] 01/12/2024  If 7PM-7AM, please contact night-coverage

## 2024-01-12 NOTE — Progress Notes (Addendum)
[facility] Liaison Note  Received request from [place], Transitions of Care Manager, for hospice services at home after discharge. Spoke with patient and spouse to initiate education related to hospice philosophy, services, and team approach to care. Patient/family verbalized understanding of information given. Per discussion, the plan is for discharge home as soon as DME can be delivered.   DME needs discussed. Patient has the a walker, rollator, bedside commode, wheelchair, and shower chair in the home. Patient/family requests the following equipment for delivery: oxygen and overbed table (patient did not want a hospital bed at this time). The address has been verified and is correct in the chart. Douglas Matthews is the family contact to arrange time of equipment delivery.  Please send signed and completed DNR home with patient/family. Please provide prescriptions at discharge as needed to ensure ongoing symptom management.   [facility] information and contact numbers given to Douglas Matthews. Above information shared with Douglas Matthews, Transitions of Care Manager. Please call with any questions or concerns.  Thank you for the opportunity to participate in this patient's care.   Douglas Matthews BSN, RN, OCN [facility] [phone]  Update 116pm- Wife informed me that they would still like to receive regular lab checks and iron infusions. They would like to cancel hospice services and DME. Inpatient team made aware.

## 2024-01-12 NOTE — Progress Notes (Signed)
Patient ID: Douglas Matthews, male   DOB: 09/17/1935, 89 y.o.   MRN: [id number] [facility] Progress Note   Assessment/ Plan:   1.  Chronic kidney disease stage V: Progressive and not a candidate for renal replacement therapy/dialysis with underlying severe heart disease.  BUN unchanged but creatinine slightly higher overnight status post furosemide but without any uremic signs or symptoms.  He does not have any acute electrolyte abnormality prompting intervention.  Agree with palliative care consultation and possibly hospice as I anticipate mortality in <6 months with his current renal function.  - transition to PO torsemide - nothing further to add- will sign off.  Call with questions 2.  Shortness of breath: Appears to be from volume excess in this patient with HFrEF.  Reportedly improved overnight status post diuretics and will wean oxygen as tolerated. 3.  Anion gap metabolic acidosis: Secondary to combination of acute kidney injury/CHF exacerbation and underlying chronic kidney disease.  Monitor on sodium bicarbonate. 4.  Anemia: This is possibly compounding his congestive heart failure, previously checked iron stores from January were borderline and I will recheck these today to determine need for intravenous iron.  Subjective:    Going to go home with hospice.  Will transition to PO meds  Objective:   BP 111/74 (BP Location: Right Arm)   Pulse 88   Temp 98.2 F (36.8 C)   Resp 18   Ht 5\' 8"  (1.727 m)   Wt 62 kg   SpO2 100%   BMI 20.78 kg/m   Intake/Output Summary (Last 24 hours) at 01/12/2024 1238 Last data filed at 01/12/2024 1047 Gross per 24 hour  Intake 944.33 ml  Output 100 ml  Net 844.33 ml   Weight change: -0.143 kg  Physical Exam: Gen: Elderly man who is resting comfortably in bed.  On oxygen via [place] CVS: Pulse regular rhythm, normal rate, S1 and S2 normal Resp: Clear to auscultation bilaterally without rales/rhonchi Abd: Soft, flat, nontender,  bowel sounds normal Ext: Trace ankle edema  Imaging: US Renal Result Date: 01/10/2024 CLINICAL DATA:  Acute renal insufficiency EXAM: RENAL / URINARY TRACT ULTRASOUND COMPLETE COMPARISON:  None Available. FINDINGS: Right Kidney: Renal measurements: 10.3 x 3.7 x 6.0 cm = volume: 119.1 mL. There is diffuse increased renal cortical echotexture consistent with medical renal disease. Loss of corticomedullary differentiation. No hydronephrosis or renal mass. Left Kidney: Renal measurements: 9.6 x 5.2 x 5.7 cm = volume: 148.8 mL. Diffuse increased renal cortical echotexture consistent with medical renal disease. Loss of normal corticomedullary differentiation. Simple renal cortical cysts, measuring up to 3.2 cm within the upper pole left kidney. No hydronephrosis. Bladder: Appears normal for degree of bladder distention. Other: Incidental note is made of small volume ascites and small bilateral pleural effusions. IMPRESSION: 1. Bilateral renal cortical atrophy, with diffuse increased renal cortical echotexture consistent with medical renal disease. 2. Incidental small bilateral pleural effusions and small volume ascites. Electronically Signed   By: Sharlet Salina M.D.   On: 01/10/2024 17:20   DG Chest 2 View Result Date: 01/10/2024 CLINICAL DATA:  Shortness of breath. EXAM: CHEST - 2 VIEW COMPARISON:  Chest radiograph dated 12/04/2023. FINDINGS: Small bilateral pleural effusions and bibasilar atelectasis or infiltrate. No pneumothorax. Mild cardiomegaly. Atherosclerotic calcification of the aorta. No acute osseous pathology. Degenerative changes of the spine. IMPRESSION: Small bilateral pleural effusions and bibasilar atelectasis or infiltrate. Electronically Signed   By: Elgie Collard M.D.   On: 01/10/2024 13:53    Labs: BMET Recent  Labs  Lab 01/10/24 1243 01/11/24 0445 01/12/24 0442  NA 143 139 141  K 4.8 4.8 4.8  CL 106 104 104  CO2 15* 16* 17*  GLUCOSE 105* 95 96  BUN 186* 185* 192*  CREATININE  8.56* 8.91* 9.23*  CALCIUM 8.3* 8.0* 8.3*  PHOS  --  10.3* 11.0*   CBC Recent Labs  Lab 01/10/24 1243 01/12/24 0442  WBC 4.9 5.0  HGB 8.6* 8.4*  HCT 28.0* 25.9*  MCV 87.5 83.3  PLT 317 321    Medications:     amiodarone  200 mg Oral Daily   calcitRIOL  0.25 mcg Oral QODAY   heparin  5,000 Units Subcutaneous Q8H   levothyroxine  100 mcg Oral Q0600   sodium bicarbonate  1,300 mg Oral BID   Bufford Buttner, MD 01/12/2024, 12:38 PM

## 2024-01-12 NOTE — Progress Notes (Signed)
   Palliative Medicine Inpatient Follow Up Note  HPI: Douglas Matthews is a 89 y.o. male with medical history significant of A-fib, BPH, CKD stage V, hypertension, hyperlipidemia, hypothyroidism who presented to the emergency department due to progressively worsening shortness of breath.    The Palliative care team has asked for additional goals of care conversations.   Today's Discussion 01/12/2024  *Please note that this is a verbal dictation therefore any spelling or grammatical errors are due to the "Dragon Medical One" system interpretation.  Chart reviewed inclusive of vital signs, progress notes, laboratory results, and diagnostic images.   I met with Douglas Matthews at bedside this morning. He is resting comfortably in NAD. He shares that he experienced a bit of nausea overnight though it passed on it's own. We reviewed the plan at this point - patient is very understanding about his current illness and disease trajectory. He realizes likely time limitations.   Devron is knowledgeable of hospice support as an option moving forward. Created space and opportunity for patient to explore thoughts feelings and fears regarding current medical situation.  I spoke with patients spouse, Douglas Matthews this morning. We reviewed that she has spoken to [name] and decided to allow hospice support for [facility] on discharge. Douglas Matthews shares that she has helped many church members through this process of life. She is at peace with the decision at this time.   We discussed the plan for [facility] to reach out to further coordinate home hospice support.   Questions and concerns addressed/Palliative Support Provided.   Objective Assessment: Vital Signs Vitals:   01/11/24 2207 01/12/24 0841  BP: 92/76 111/74  Pulse: 80 88  Resp: 18 18  Temp: 98.1 F (36.7 C) 98.2 F (36.8 C)  SpO2: 99% 100%    Intake/Output Summary (Last 24 hours) at 01/12/2024 1027 Last data filed at 01/12/2024 0848 Gross per 24 hour  Intake  944.33 ml  Output 0 ml  Net 944.33 ml   Last Weight  Most recent update: 01/12/2024  2:05 AM    Weight  62 kg (136 lb 11 oz)            Gen: Elderly African-American male in no acute distress HEENT: moist mucous membranes CV: Regular rate and irregular rhythm PULM: On 2LPM [place], breathing is even and nonlabored ABD: soft/nontender EXT: No edema Neuro: Alert and oriented x3  SUMMARY OF RECOMMENDATIONS   DNAR/DNI   MOST/ DNR Form Completed, paper copy placed onto the chart electric copy can be found in Vynca   Plan for patient to transition home with hospice services  Appreciate [facility] involvement  Billing based on MDM: High ______________________________________________________________________________________ Lamarr Lulas [place] Palliative Medicine Team Team Cell Phone: [phone] Please utilize secure chat with additional questions, if there is no response within 30 minutes please call the above phone number  Palliative Medicine Team providers are available by phone from 7am to 7pm daily and can be reached through the team cell phone.  Should this patient require assistance outside of these hours, please call the patient's attending physician.

## 2024-01-12 NOTE — Plan of Care (Signed)

## 2024-01-12 NOTE — TOC CM/SW Note (Signed)
Transition of Care [facility]) - Inpatient Brief Assessment   Patient Details  Name: Douglas Matthews MRN: [id number] Date of Birth: [birth year]  Transition of [facility]) CM/SW Contact:    Tom-Johnson, Hershal Coria, RN Phone Number: 01/12/2024, 3:27 PM   Clinical Narrative:  Patient presented to the ED with progressively worsening Shortness of Breath and Swelling in Bilateral Lower Extremities. Patient is admitted with Hypervolemia. On 2L O2, not on home O2.  Was on IV Lasix, started Oral Demadex today.  Patient has hx of A-fib, on Eliquis, BPH, HTN, Hyperlipidemia, Hypothyroidism, CKD Stage V,  patient is followed by a Nephrologist outpatient and has been deemed not a candidate for Hemodialysis. Nephrology following inpatient.   From home with wife, has two supportive children. Wife and family assists with care at home. Has all necessary DME's at home.  PCP is [place], [name], MD and uses [facility] on [address]   Home health recommended, patient is active with [facility]. CM sent resumption of care referral to [facility] with acceptance noted, info on AVS.   CM consulted again for Hospice at home. Referral sent to [facility], Shawn spoke with patient and wife and wife does not want Hospice services at this time as they want to continue to receive regular Labs and Iron Infusions which will not be covered under Hospice benefits.  Home health referral still stands with [facility].    CM will continue to follow as patient progresses with care towards discharge.           Transition of Care Asessment: Insurance and Status: Insurance coverage has been reviewed Patient has primary care physician: Yes Home environment has been reviewed: Yes Prior level of function:: Modified indepndent Prior/Current Home Services: Current home services (Home health with [facility]) Social Drivers of Health Review: SDOH reviewed no interventions necessary Readmission risk has been reviewed:  Yes Transition of care needs: transition of care needs identified, TOC will continue to follow

## 2024-01-13 DIAGNOSIS — E8770 Fluid overload, unspecified: Secondary | ICD-10-CM | POA: Diagnosis not present

## 2024-01-13 DIAGNOSIS — Z7189 Other specified counseling: Secondary | ICD-10-CM | POA: Diagnosis not present

## 2024-01-13 DIAGNOSIS — Z515 Encounter for palliative care: Secondary | ICD-10-CM | POA: Diagnosis not present

## 2024-01-13 LAB — RENAL FUNCTION PANEL
Albumin: 2.4 g/dL — ABNORMAL LOW (ref 3.5–5.0)
Anion gap: 28 — ABNORMAL HIGH (ref 5–15)
BUN: 195 mg/dL — ABNORMAL HIGH (ref 8–23)
CO2: 15 mmol/L — ABNORMAL LOW (ref 22–32)
Calcium: 8.6 mg/dL — ABNORMAL LOW (ref 8.9–10.3)
Chloride: 99 mmol/L (ref 98–111)
Creatinine, Ser: 9.57 mg/dL — ABNORMAL HIGH (ref 0.61–1.24)
GFR, Estimated: 5 mL/min — ABNORMAL LOW (ref >60)
Glucose, Bld: 108 mg/dL — ABNORMAL HIGH (ref 70–99)
Phosphorus: 11.4 mg/dL — ABNORMAL HIGH (ref 2.5–4.6)
Potassium: 4.7 mmol/L (ref 3.5–5.1)
Sodium: 142 mmol/L (ref 135–145)

## 2024-01-13 MED ORDER — ALBUTEROL SULFATE HFA 108 (90 BASE) MCG/ACT IN AERS: 2.0000 | INHALATION_SPRAY | Freq: Four times a day (QID) | RESPIRATORY_TRACT | 2 refills | Status: DC | PRN

## 2024-01-13 MED ORDER — SODIUM BICARBONATE 650 MG PO TABS: 1300.0000 mg | ORAL_TABLET | Freq: Two times a day (BID) | ORAL | 0 refills | Status: DC

## 2024-01-13 NOTE — Plan of Care (Signed)

## 2024-01-13 NOTE — Care Management Important Message (Signed)
Important Message  Patient Details  Name: Douglas Matthews MRN: [id number] Date of Birth: [birth year]   Important Message Given:  Yes - Medicare IM Patient left prior to IM delivery will mail a copy to the patient home address    Douglas Matthews 01/13/2024, 3:13 PM

## 2024-01-13 NOTE — Progress Notes (Signed)
DISCHARGE NOTE HOME Douglas Matthews to be discharged Home per MD order. Discussed prescriptions and follow up appointments with the patient. Prescriptions given to patient; medication list explained in detail. Patient verbalized understanding.  Skin clean, dry and intact without evidence of skin break down, no evidence of skin tears noted. IV catheter discontinued intact. Site without signs and symptoms of complications. Dressing and pressure applied. Pt denies pain at the site currently. No complaints noted.  Patient free of lines, drains, and wounds. See LDA for wounds on BLE  An After Visit Summary (AVS) was printed and given to the patient. Patient escorted via wheelchair, and discharged to discharge lounge. Wife coming.   Irwin Brakeman, RN

## 2024-01-13 NOTE — Consult Note (Signed)
[facility] Liaison Consult Note   01/13/2024  Douglas Matthews 22-Mar-1935 [id number]  Insurance: Medicare ACO REACH  Primary Care Provider: [place], [name], MD  this provider is listed for the transition of care follow up appointments  and [facility] TOC calls   The patient was screened for 7 and 30 day readmission hospitalization with noted high risk score for unplanned readmission risk 2 hospital admissions in 6 months.  The patient was assessed for potential Community Care Coordination service needs for post hospital transition for care coordination. Review of patient's electronic medical record reveals patient is for home today with [facility] with [facility] noted per inpatient TOC RNCM.Marland Kitchen   Plan: [facility] Liaison will continue to follow progress and disposition to asess for post hospital community care coordination/management needs.  Referral request for community care coordination: Anticipate patient to be followed by [facility] TOC team for follow up.Wife, [name] is on his DPR for contact noted.   [facility] Community Care, Population Health does not replace or interfere with any arrangements made by the Inpatient Transition of Care team.   For questions contact:   Charlesetta Shanks, RN, BSN, CCM [facility], [facility] Liaison Direct Dial: [phone] or secure chat Email: [email or web address]

## 2024-01-13 NOTE — TOC Transition Note (Signed)
Transition of Care [facility]) - Discharge Note   Patient Details  Name: Douglas Matthews MRN: [id number] Date of Birth: May 16, 1935  Transition of Care [facility]) CM/SW Contact:  Tom-Johnson, Hershal Coria, RN Phone Number: 01/13/2024, 10:19 AM   Clinical Narrative:     Patient is scheduled for discharge today.  Readmission Risk Assessment done. Home health info, Outpatient f/u, hospital f/u and discharge instructions on AVS. Wife, [name] to transport at discharge.  No further TOC needs noted.        Final next level of care: Home w Home Health Services Barriers to Discharge: Barriers Resolved   Patient Goals and CMS Choice            Discharge Placement                Patient to be transferred to facility by: Wife Name of family member notified: [name]    Discharge Plan and Services Additional resources added to the After Visit Summary for                                       Social Drivers of Health (SDOH) Interventions SDOH Screenings   Food Insecurity: No Food Insecurity (01/12/2024)  Housing: Low Risk  (01/12/2024)  Transportation Needs: No Transportation Needs (01/12/2024)  Utilities: Not At Risk (01/12/2024)  Depression (PHQ2-9): Low Risk  (08/07/2023)  Financial Resource Strain: Low Risk  (07/08/2022)  Physical Activity: Insufficiently Active (07/08/2022)  Social Connections: Moderately Integrated (01/12/2024)  Stress: No Stress Concern Present (07/08/2022)  Tobacco Use: Low Risk  (01/05/2024)     Readmission Risk Interventions    01/12/2024    3:26 PM 01/01/2024   12:12 PM  Readmission Risk Prevention Plan  Transportation Screening Complete Complete  PCP or Specialist Appt within 5-7 Days  Complete  PCP or Specialist Appt within 3-5 Days Complete   Home Care Screening  Complete  Medication Review (RN CM)  Complete  HRI or Home Care Consult Complete   Social Work Consult for Recovery Care Planning/Counseling Complete   Palliative Care Screening  Not Applicable   Medication Review [facility]) Referral to Pharmacy

## 2024-01-13 NOTE — Progress Notes (Signed)
   Palliative Medicine Inpatient Follow Up Note HPI: Douglas Matthews is a 89 y.o. male with medical history significant of A-fib, BPH, CKD stage V, hypertension, hyperlipidemia, hypothyroidism who presented to the emergency department due to progressively worsening shortness of breath.    The Palliative care team has asked for additional goals of care conversations.   Today's Discussion 01/13/2024  *Please note that this is a verbal dictation therefore any spelling or grammatical errors are due to the "Dragon Medical One" system interpretation.  Chart reviewed inclusive of vital signs, progress notes, laboratory results, and diagnostic images.   I met with Douglas Matthews this morning he is no distress. He shares the desire to go home later in the day.  Patient wife has determined she now would like patient to go home with [facility] and no longer wants hospice support.   Plan for discharge home later today.   Questions and concerns addressed/Palliative Support Provided.   Objective Assessment: Vital Signs Vitals:   01/13/24 0533 01/13/24 0833  BP: 107/68   Pulse: 82   Resp: 18   Temp: 98.1 F (36.7 C)   SpO2: 99% 100%    Intake/Output Summary (Last 24 hours) at 01/13/2024 1116 Last data filed at 01/13/2024 3244 Gross per 24 hour  Intake 510 ml  Output 300 ml  Net 210 ml   Last Weight  Most recent update: 01/13/2024  5:14 AM    Weight  62 kg (136 lb 11 oz)            Gen: Elderly African-American male in no acute distress HEENT: moist mucous membranes CV: Regular rate and irregular rhythm PULM: On 2LPM [place], breathing is even and nonlabored ABD: soft/nontender EXT: No edema Neuro: Alert and oriented x3  SUMMARY OF RECOMMENDATIONS   DNAR/DNI   MOST/ DNR Form Completed, paper copy placed onto the chart electric copy can be found in Vynca   Plan for patient to transition home with [facility] - wife prefers to not pursue hospice services  Time:  25 ______________________________________________________________________________________ Douglas Matthews [place] Palliative Medicine Team Team Cell Phone: [phone] Please utilize secure chat with additional questions, if there is no response within 30 minutes please call the above phone number  Palliative Medicine Team providers are available by phone from 7am to 7pm daily and can be reached through the team cell phone.  Should this patient require assistance outside of these hours, please call the patient's attending physician.

## 2024-01-13 NOTE — Discharge Summary (Signed)
Physician Discharge Summary  Douglas Matthews GNF:[id number] DOB: [birth year] DOA: 01/10/2024  PCP: [place], [name], MD  Admit date: 01/10/2024 Discharge date: 01/13/2024  Admitted From: Home  Discharge disposition: Home with home health   Recommendations for Outpatient Follow-Up:   Follow up with your primary care provider in one week.  Check CBC, BMP, magnesium in the next visit Patient would benefit from palliative care hospice consideration as outpatient with ongoing goals of care discussion.   Discharge Diagnosis:   Principal Problem:   Hypervolemia   Discharge Condition: Improved.  Diet recommendation: Low sodium, heart healthy.   Wound care: None.  Code status: DNR   History of Present Illness:   Douglas Matthews is a 89 y.o. male with medical history significant of atrial fibrillation,, BPH, CKD stage V, hypertension, hyperlipidemia, hypothyroidism presented to hospital with progressive shortness of breath with the swelling of the bilateral lower extremities.  Has been seen by nephrology as outpatient.  In the ED, patient was afebrile and hemodynamically stable.  Labs showed creatinine of 8.5.  Chest x-ray with small pleural effusion without pulmonary edema.  Patient received 120 mg of IV Lasix in the ED and was admitted hospital for further evaluation and treatment.    Hospital Course:   Following conditions were addressed during hospitalization as listed below,  Shortness of breath most likely secondary to progressively worsening renal failure  Acute on chronic CKD stage V Had metabolic acidosis with bicarb of 15 hypovolemia on presentation.  Initially received aggressive diuresis with IV Lasix will be changed to torsemide on discharge.  Nephrology followed the patient during hospitalization.  Not a good candidate for hemodialysis.  Palliative care was consulted and initial thought was hospice but patient's family has refused hospice at this time.  Will plan for  home with home health on discharge.Marland Kitchen MOST form DNR form has been completed.      Paroxysmal A-fib continue amiodarone. Eliquis on hold due to recent GI bleed.     hypothyroidism-continue levothyroxine   Elevated troponin likely secondary to demand ischemia.  No chest pain or EKG changes.  No further plans made for evaluation.  Disposition.  At this time, patient is stable for disposition home with home health services.  Medical Consultants:   Nephrology  Procedures:    None Subjective:   Today, patient was seen and examined at bedside.  Denies any pain, nausea, dyspnea.  Wants to go home.  Discharge Exam:   Vitals:   01/13/24 0533 01/13/24 0833  BP: 107/68   Pulse: 82   Resp: 18   Temp: 98.1 F (36.7 C)   SpO2: 99% 100%   Vitals:   01/12/24 2045 01/13/24 0500 01/13/24 0533 01/13/24 0833  BP: 101/70  107/68   Pulse: (!) 54  82   Resp: 18  18   Temp: 98.1 F (36.7 C)  98.1 F (36.7 C)   TempSrc:      SpO2: 90%  99% 100%  Weight:  62 kg    Height:        General: Alert awake, not in obvious distress, elderly male, not in obvious distress, Communicative, on room air. HENT: pupils equally reacting to light,  No scleral pallor or icterus noted. Oral mucosa is moist.  Chest:  Clear breath sounds.  CVS: S1 &S2 heard. No murmur.  Regular rate and rhythm. Abdomen: Soft, nontender, nondistended.  Bowel sounds are heard.   Extremities: No cyanosis, clubbing, trace ankle edema peripheral pulses are palpable.  Psych: Alert, awake and oriented, normal mood CNS:  No cranial nerve deficits.  Power equal in all extremities.   Skin: Warm and dry.  No rashes noted.  The results of significant diagnostics from this hospitalization (including imaging, microbiology, ancillary and laboratory) are listed below for reference.     Diagnostic Studies:   US Renal Result Date: 2024/01/30 CLINICAL DATA:  Acute renal insufficiency EXAM: RENAL / URINARY TRACT ULTRASOUND COMPLETE  COMPARISON:  None Available. FINDINGS: Right Kidney: Renal measurements: 10.3 x 3.7 x 6.0 cm = volume: 119.1 mL. There is diffuse increased renal cortical echotexture consistent with medical renal disease. Loss of corticomedullary differentiation. No hydronephrosis or renal mass. Left Kidney: Renal measurements: 9.6 x 5.2 x 5.7 cm = volume: 148.8 mL. Diffuse increased renal cortical echotexture consistent with medical renal disease. Loss of normal corticomedullary differentiation. Simple renal cortical cysts, measuring up to 3.2 cm within the upper pole left kidney. No hydronephrosis. Bladder: Appears normal for degree of bladder distention. Other: Incidental note is made of small volume ascites and small bilateral pleural effusions. IMPRESSION: 1. Bilateral renal cortical atrophy, with diffuse increased renal cortical echotexture consistent with medical renal disease. 2. Incidental small bilateral pleural effusions and small volume ascites. Electronically Signed   By: Sharlet Salina M.D.   On: 01/30/2024 17:20   DG Chest 2 View Result Date: January 30, 2024 CLINICAL DATA:  Shortness of breath. EXAM: CHEST - 2 VIEW COMPARISON:  Chest radiograph dated 12/04/2023. FINDINGS: Small bilateral pleural effusions and bibasilar atelectasis or infiltrate. No pneumothorax. Mild cardiomegaly. Atherosclerotic calcification of the aorta. No acute osseous pathology. Degenerative changes of the spine. IMPRESSION: Small bilateral pleural effusions and bibasilar atelectasis or infiltrate. Electronically Signed   By: Elgie Collard M.D.   On: 01/30/2024 13:53     Labs:   Basic Metabolic Panel: Recent Labs  Lab 30-Jan-2024 1243 01/11/24 0445 01/12/24 0442 01/13/24 0429  NA 143 139 141 142  K 4.8 4.8 4.8 4.7  CL 106 104 104 99  CO2 15* 16* 17* 15*  GLUCOSE 105* 95 96 108*  BUN 186* 185* 192* 195*  CREATININE 8.56* 8.91* 9.23* 9.57*  CALCIUM 8.3* 8.0* 8.3* 8.6*  MG  --   --  2.2  --   PHOS  --  10.3* 11.0* 11.4*    GFR Estimated Creatinine Clearance: 4.9 mL/min (A) (by C-G formula based on SCr of 9.57 mg/dL (H)). Liver Function Tests: Recent Labs  Lab 01-30-24 1243 01/11/24 0445 01/12/24 0442 01/13/24 0429  AST 36  --   --   --   ALT 33  --   --   --   ALKPHOS 93  --   --   --   BILITOT 0.9  --   --   --   PROT 7.4  --   --   --   ALBUMIN 2.6* 2.5* 2.4* 2.4*   No results for input(s): "LIPASE", "AMYLASE" in the last 168 hours. No results for input(s): "AMMONIA" in the last 168 hours. Coagulation profile No results for input(s): "INR", "PROTIME" in the last 168 hours.  CBC: Recent Labs  Lab January 30, 2024 1243 01/12/24 0442  WBC 4.9 5.0  HGB 8.6* 8.4*  HCT 28.0* 25.9*  MCV 87.5 83.3  PLT 317 321   Cardiac Enzymes: No results for input(s): "CKTOTAL", "CKMB", "CKMBINDEX", "TROPONINI" in the last 168 hours. BNP: Invalid input(s): "POCBNP" CBG: Recent Labs  Lab 01/12/24 0720 01/12/24 1120 01/12/24 1628 01/12/24 2046  GLUCAP 83 91 90 330*  D-Dimer No results for input(s): "DDIMER" in the last 72 hours. Hgb A1c No results for input(s): "HGBA1C" in the last 72 hours. Lipid Profile No results for input(s): "CHOL", "HDL", "LDLCALC", "TRIG", "CHOLHDL", "LDLDIRECT" in the last 72 hours. Thyroid function studies No results for input(s): "TSH", "T4TOTAL", "T3FREE", "THYROIDAB" in the last 72 hours.  Invalid input(s): "FREET3" Anemia work up Recent Labs    01/11/24 1105  FERRITIN 71  TIBC 308  IRON 15*   Microbiology Recent Results (from the past 240 hours)  Resp panel by RT-PCR (RSV, Flu A&B, Covid) Anterior Nasal Swab     Status: None   Collection Time: 01/10/24 12:59 PM   Specimen: Anterior Nasal Swab  Result Value Ref Range Status   SARS Coronavirus 2 by RT PCR NEGATIVE NEGATIVE Final    Comment: (NOTE) SARS-CoV-2 target nucleic acids are NOT DETECTED.  The SARS-CoV-2 RNA is generally detectable in upper respiratory specimens during the acute phase of infection. The  lowest concentration of SARS-CoV-2 viral copies this assay can detect is 138 copies/mL. A negative result does not preclude SARS-Cov-2 infection and should not be used as the sole basis for treatment or other patient management decisions. A negative result may occur with  improper specimen collection/handling, submission of specimen other than nasopharyngeal swab, presence of viral mutation(s) within the areas targeted by this assay, and inadequate number of viral copies(<138 copies/mL). A negative result must be combined with clinical observations, patient history, and epidemiological information. The expected result is Negative.  Fact Sheet for Patients:  BloggerCourse.com  Fact Sheet for Healthcare Providers:  SeriousBroker.it  This test is no t yet approved or cleared by the [place] FDA and  has been authorized for detection and/or diagnosis of SARS-CoV-2 by FDA under an Emergency Use Authorization (EUA). This EUA will remain  in effect (meaning this test can be used) for the duration of the COVID-19 declaration under Section 564(b)(1) of the Act, 21 U.S.C.section 360bbb-3(b)(1), unless the authorization is terminated  or revoked sooner.       Influenza A by PCR NEGATIVE NEGATIVE Final   Influenza B by PCR NEGATIVE NEGATIVE Final    Comment: (NOTE) The Xpert Xpress SARS-CoV-2/FLU/RSV plus assay is intended as an aid in the diagnosis of influenza from Nasopharyngeal swab specimens and should not be used as a sole basis for treatment. Nasal washings and aspirates are unacceptable for Xpert Xpress SARS-CoV-2/FLU/RSV testing.  Fact Sheet for Patients: BloggerCourse.com  Fact Sheet for Healthcare Providers: SeriousBroker.it  This test is not yet approved or cleared by the [place] FDA and has been authorized for detection and/or diagnosis of SARS-CoV-2 by FDA under  an Emergency Use Authorization (EUA). This EUA will remain in effect (meaning this test can be used) for the duration of the COVID-19 declaration under Section 564(b)(1) of the Act, 21 U.S.C. section 360bbb-3(b)(1), unless the authorization is terminated or revoked.     Resp Syncytial Virus by PCR NEGATIVE NEGATIVE Final    Comment: (NOTE) Fact Sheet for Patients: BloggerCourse.com  Fact Sheet for Healthcare Providers: SeriousBroker.it  This test is not yet approved or cleared by the [place] FDA and has been authorized for detection and/or diagnosis of SARS-CoV-2 by FDA under an Emergency Use Authorization (EUA). This EUA will remain in effect (meaning this test can be used) for the duration of the COVID-19 declaration under Section 564(b)(1) of the Act, 21 U.S.C. section 360bbb-3(b)(1), unless the authorization is terminated or revoked.  Performed at [facility], [address], [place], [place] [address]      Discharge Instructions:   Discharge Instructions     Diet - low sodium heart healthy   Complete by: As directed    Discharge instructions   Complete by: As directed    Follow-up with your primary care provider in 1 week.  Check blood work at that time. Seek medical attention for worsening symptoms.  Seek palliative care help if desired.   Increase activity slowly   Complete by: As directed    No wound care   Complete by: As directed       Allergies as of 01/13/2024   No Known Allergies      Medication List     TAKE these medications    albuterol 108 (90 Base) MCG/ACT inhaler Commonly known as: VENTOLIN HFA Inhale 2 puffs into the lungs every 6 (six) hours as needed for wheezing or shortness of breath.   aluminum sulfate-calcium acetate packet Commonly known as: DOMEBORO Apply 1 packet topically 3 (three) times daily. Mixed it in water. What changed:  when to take  this reasons to take this additional instructions   amiodarone 200 MG tablet Commonly known as: PACERONE Take 1 tablet (200 mg total) by mouth daily.   calcitRIOL 0.25 MCG capsule Commonly known as: ROCALTROL Take 0.25 mcg by mouth every other day.   levothyroxine 100 MCG tablet Commonly known as: SYNTHROID Take 1 tablet (100 mcg total) by mouth daily. What changed: when to take this   sodium bicarbonate 650 MG tablet Take 2 tablets (1,300 mg total) by mouth 2 (two) times daily.   torsemide 100 MG tablet Commonly known as: DEMADEX Take 1 tablet (100 mg total) by mouth daily.        Follow-up Information     [place], [name], MD Follow up in 1 week(s).   Specialty: Family Medicine Contact information: [address] [place] [address] [phone]         Jobe Gibbon, [facility] Follow up.   Specialty: Home Health Services Why: Someone will call you to schedule first home visit. Contact information: [address] [place] [address] [phone]                  Time coordinating discharge: 39 minutes  Signed:  Meera Vasco  [facility] 01/13/2024, 1:38 PM

## 2024-01-14 ENCOUNTER — Encounter (HOSPITAL_COMMUNITY): Payer: Self-pay

## 2024-01-14 ENCOUNTER — Telehealth: Payer: Self-pay

## 2024-01-14 DIAGNOSIS — L97512 Non-pressure chronic ulcer of other part of right foot with fat layer exposed: Secondary | ICD-10-CM | POA: Diagnosis not present

## 2024-01-14 DIAGNOSIS — I872 Venous insufficiency (chronic) (peripheral): Secondary | ICD-10-CM | POA: Diagnosis not present

## 2024-01-14 DIAGNOSIS — L97812 Non-pressure chronic ulcer of other part of right lower leg with fat layer exposed: Secondary | ICD-10-CM | POA: Diagnosis not present

## 2024-01-14 DIAGNOSIS — L97822 Non-pressure chronic ulcer of other part of left lower leg with fat layer exposed: Secondary | ICD-10-CM | POA: Diagnosis not present

## 2024-01-14 DIAGNOSIS — I132 Hypertensive heart and chronic kidney disease with heart failure and with stage 5 chronic kidney disease, or end stage renal disease: Secondary | ICD-10-CM | POA: Diagnosis not present

## 2024-01-14 DIAGNOSIS — L97522 Non-pressure chronic ulcer of other part of left foot with fat layer exposed: Secondary | ICD-10-CM | POA: Diagnosis not present

## 2024-01-14 NOTE — Transitions of Care (Post Inpatient/ED Visit) (Signed)
01/14/2024  Name: Douglas Matthews MRN: [id number] DOB: [birth year]  Today's TOC FU Call Status: Today's TOC FU Call Status:: Successful TOC FU Call Completed TOC FU Call Complete Date: 01/14/24 Patient's Name and Date of Birth confirmed.  Transition Care Management Follow-up Telephone Call Date of Discharge: 01/13/24 Type of Discharge: Inpatient Admission Primary Inpatient Discharge Diagnosis:: metabolic acidosis How have you been since you were released from the hospital?: Better (Reports breathing is better) Any questions or concerns?: No  Items Reviewed: Did you receive and understand the discharge instructions provided?: Yes Any new allergies since your discharge?: No Dietary orders reviewed?: Yes Type of Diet Ordered:: low sodium diet Do you have support at home?: Yes People in Home: spouse Name of Support/Comfort Primary Source: Douglas Matthews  Medications Reviewed Today: Medications Reviewed Today     Reviewed by Earlie Server, RN (Registered Nurse) on 01/14/24 at 1031  Med List Status: <None>   Medication Order Taking? Sig Documenting Provider Last Dose Status Informant  albuterol (VENTOLIN HFA) 108 (90 Base) MCG/ACT inhaler [id number] Yes Inhale 2 puffs into the lungs every 6 (six) hours as needed for wheezing or shortness of breath. Joycelyn Das, MD Taking Active   aluminum sulfate-calcium acetate [facility]) packet [id number] No Apply 1 packet topically 3 (three) times daily. Mixed it in water.  Patient not taking: Reported on 01/14/2024   [place], [name], MD Not Taking Active Multiple Informants  amiodarone (PACERONE) 200 MG tablet [id number] Yes Take 1 tablet (200 mg total) by mouth daily. Gaston Islam., NP Taking Active Multiple Informants           Med Note Antony Madura, Dyane Dustman Jan 10, 2024  4:53 PM)    calcitRIOL (ROCALTROL) 0.25 MCG capsule [id number] Yes Take 0.25 mcg by mouth every other day. [provider] Taking Active Multiple Informants   levothyroxine (SYNTHROID) 100 MCG tablet [id number] Yes Take 1 tablet (100 mcg total) by mouth daily.  Patient taking differently: Take 100 mcg by mouth daily before breakfast.   [place], [name], MD Taking Active Multiple Informants           Med Note Antony Madura, Dyane Dustman Jan 10, 2024  5:08 PM) The patient has NOT YET started the 88 mcg strength  sodium bicarbonate 650 MG tablet [id number] Yes Take 2 tablets (1,300 mg total) by mouth 2 (two) times daily. Pokhrel, Rebekah Chesterfield, MD Taking Active   torsemide (DEMADEX) 100 MG tablet [id number] Yes Take 1 tablet (100 mg total) by mouth daily. [place], [name], MD Taking Active Multiple Informants            Home Care and Equipment/Supplies: Were Home Health Services Ordered?: Yes Name of Home Health Agency:: [facility]. Has Agency set up a time to come to your home?: Yes First Home Health Visit Date: 01/14/24 Any new equipment or medical supplies ordered?: No  Functional Questionnaire: Do you need assistance with bathing/showering or dressing?: Yes (wife,  bath aid) Do you need assistance with meal preparation?: Yes (wife) Do you need assistance with eating?: No Do you have difficulty maintaining continence: Yes (pad) Do you need assistance with getting out of bed/getting out of a chair/moving?: Yes (wife) Do you have difficulty managing or taking your medications?: No (patient and wife manage medications together.)  Follow up appointments reviewed: PCP Follow-up appointment confirmed?: No (wife will call for an appointment) MD Provider Line Number:[phone] Given: No Specialist Hospital Follow-up appointment confirmed?: No Reason Specialist Follow-Up Not  Confirmed: Patient has Specialist Provider Number and will Call for Appointment Do you need transportation to your follow-up appointment?: No Do you understand care options if your condition(s) worsen?: Yes-patient verbalized understanding  SDOH Interventions Today    Flowsheet Row Most  Recent Value  SDOH Interventions   Food Insecurity Interventions Intervention Not Indicated  Housing Interventions Intervention Not Indicated  Transportation Interventions Intervention Not Indicated  Utilities Interventions Intervention Not Indicated      Recommendation at discharge was hospice care and patient/wife declined.  Patient reports that his breathing is ok today.  Reviewed in depth about kidney function and the role it is having on fluid overload, Wife states that she does not understand why something is not being done about kidney failure. Reviewed with wife Nephrology consult while patient was in the hospital.   Reviewed and offered 30 day TOC program and patient has agreed and consented to enrollement.    Goals Addressed               This Visit's Progress     Patient and or wife will verbalize no readmission to the hospital in the next 30 days. (pt-stated)        Current Barriers:  Provider appointments No PCP follow up appointment scheduled Home Health services Home health to start today ( 01/14/2024) ESRD and CHF.  Patient not a candidate for dialysis per chart.  No plan in place for increased in shortness of breath. Patient states that he does not want to go back to the hospital  RNCM Clinical Goal(s):  Patient will work with the Care Management team over the next 30 days to address Transition of Care Barriers: Provider appointments Home Health services verbalize understanding of plan for management of CHF and ESRD as evidenced by wife and patient able to verbalize understanding  take all medications exactly as prescribed and will call provider for medication related questions as evidenced by patient report attend all scheduled medical appointments: PCP and specialist as evidenced by review of EMR and patient report  through collaboration with RN Care manager, provider, and care team.   Interventions: Evaluation of current treatment plan related to  self management  and patient's adherence to plan as established by provider  Transitions of Care:  New goal. Doctor Visits  - discussed the importance of doctor visits Reviewed Signs and symptoms of infection Encouraged patient and wife to follow a low salt diet.  Reviewed importance of taking all medications as prescribed  Heart Failure Interventions:  (Status:  New goal.) Short Term Goal Basic overview and discussion of pathophysiology of Heart Failure reviewed Provided education on low sodium diet Assessed need for readable accurate scales in home Provided education about placing scale on hard, flat surface Advised patient to weigh each morning after emptying bladder Discussed importance of daily weight and advised patient to weigh and record daily Reviewed role of diuretics in prevention of fluid overload and management of heart failure; Discussed the importance of keeping all appointments with provider Advised patient to discuss plan of care for home when he has worsening symptoms with provider Assessed social determinant of health barriers   Patient Goals/Self-Care Activities: Participate in Transition of Care Program/Attend [facility] scheduled calls Notify RN Care Manager of TOC call rescheduling needs Take all medications as prescribed Attend all scheduled provider appointments Call provider office for new concerns or questions  call office if I gain more than 2 pounds in one day or 5 pounds in one week use salt  in moderation weigh myself daily know when to call the doctor:for weight gain or increased shortness of breath Patient to keep follow up planned with home health RN  Follow Up Plan:  Telephone follow up appointment with care management team member scheduled for:  01/21/2024 The patient has been provided with contact information for the care management team and has been advised to call with any health related questions or concerns.          Lonia Chimera, RN, BSN, CEN [employer]-  Transition of Care Team.  [facility] [phone]

## 2024-01-18 ENCOUNTER — Telehealth: Payer: Self-pay

## 2024-01-18 DIAGNOSIS — L97812 Non-pressure chronic ulcer of other part of right lower leg with fat layer exposed: Secondary | ICD-10-CM | POA: Diagnosis not present

## 2024-01-18 DIAGNOSIS — I872 Venous insufficiency (chronic) (peripheral): Secondary | ICD-10-CM | POA: Diagnosis not present

## 2024-01-18 DIAGNOSIS — L97522 Non-pressure chronic ulcer of other part of left foot with fat layer exposed: Secondary | ICD-10-CM | POA: Diagnosis not present

## 2024-01-18 DIAGNOSIS — L97822 Non-pressure chronic ulcer of other part of left lower leg with fat layer exposed: Secondary | ICD-10-CM | POA: Diagnosis not present

## 2024-01-18 DIAGNOSIS — I132 Hypertensive heart and chronic kidney disease with heart failure and with stage 5 chronic kidney disease, or end stage renal disease: Secondary | ICD-10-CM | POA: Diagnosis not present

## 2024-01-18 DIAGNOSIS — L97512 Non-pressure chronic ulcer of other part of right foot with fat layer exposed: Secondary | ICD-10-CM | POA: Diagnosis not present

## 2024-01-18 NOTE — Telephone Encounter (Signed)
Copied from CRM [id number]. Topic: Clinical - Home Health Verbal Orders >> Jan 18, 2024 11:17 AM Fredrich Romans wrote: Caller/AgencyLanae Boast Number: [phone] Service Requested: Physical Therapy,OT Frequency: evaluation Any new concerns about the patient? Yes Patient has just been discharged from hospital 01/13/2024,resume care

## 2024-01-18 NOTE — Telephone Encounter (Signed)
VO given to [address].

## 2024-01-21 ENCOUNTER — Other Ambulatory Visit: Payer: Self-pay

## 2024-01-21 DIAGNOSIS — L97822 Non-pressure chronic ulcer of other part of left lower leg with fat layer exposed: Secondary | ICD-10-CM | POA: Diagnosis not present

## 2024-01-21 DIAGNOSIS — I132 Hypertensive heart and chronic kidney disease with heart failure and with stage 5 chronic kidney disease, or end stage renal disease: Secondary | ICD-10-CM | POA: Diagnosis not present

## 2024-01-21 DIAGNOSIS — L97512 Non-pressure chronic ulcer of other part of right foot with fat layer exposed: Secondary | ICD-10-CM | POA: Diagnosis not present

## 2024-01-21 DIAGNOSIS — L97812 Non-pressure chronic ulcer of other part of right lower leg with fat layer exposed: Secondary | ICD-10-CM | POA: Diagnosis not present

## 2024-01-21 DIAGNOSIS — L97522 Non-pressure chronic ulcer of other part of left foot with fat layer exposed: Secondary | ICD-10-CM | POA: Diagnosis not present

## 2024-01-21 DIAGNOSIS — I872 Venous insufficiency (chronic) (peripheral): Secondary | ICD-10-CM | POA: Diagnosis not present

## 2024-01-21 NOTE — Patient Outreach (Signed)
  Care Management  Transitions of Care Program Transitions of Care Post-discharge week 2  01/21/2024 Name: Douglas Matthews MRN: [id number] DOB: 09-02-1935  Subjective: Douglas Matthews is a 89 y.o. year old male who is a primary care patient of [place], Timoteo Expose, MD. The Care Management team was unable to reach the patient by phone to assess and address transitions of care needs.   Plan: Additional outreach attempts will be made to reach the patient enrolled in the [facility] Program (Post Inpatient/ED Visit).  Lonia Chimera, RN, BSN, CEN [employer]- Transition of Care Team.  [facility] [phone]

## 2024-01-22 ENCOUNTER — Telehealth: Payer: Medicare Other | Admitting: Family Medicine

## 2024-01-22 ENCOUNTER — Telehealth: Payer: Self-pay

## 2024-01-22 ENCOUNTER — Ambulatory Visit: Payer: Medicare Other | Admitting: Podiatry

## 2024-01-22 ENCOUNTER — Encounter: Payer: Self-pay | Admitting: Family Medicine

## 2024-01-22 VITALS — Ht 68.0 in | Wt 125.0 lb

## 2024-01-22 DIAGNOSIS — I83029 Varicose veins of left lower extremity with ulcer of unspecified site: Secondary | ICD-10-CM

## 2024-01-22 DIAGNOSIS — M79675 Pain in left toe(s): Secondary | ICD-10-CM | POA: Diagnosis not present

## 2024-01-22 DIAGNOSIS — M79674 Pain in right toe(s): Secondary | ICD-10-CM | POA: Diagnosis not present

## 2024-01-22 DIAGNOSIS — E872 Acidosis, unspecified: Secondary | ICD-10-CM | POA: Diagnosis not present

## 2024-01-22 DIAGNOSIS — B351 Tinea unguium: Secondary | ICD-10-CM

## 2024-01-22 DIAGNOSIS — E032 Hypothyroidism due to medicaments and other exogenous substances: Secondary | ICD-10-CM | POA: Diagnosis not present

## 2024-01-22 DIAGNOSIS — N185 Chronic kidney disease, stage 5: Secondary | ICD-10-CM | POA: Diagnosis not present

## 2024-01-22 DIAGNOSIS — I4819 Other persistent atrial fibrillation: Secondary | ICD-10-CM | POA: Diagnosis not present

## 2024-01-22 DIAGNOSIS — L97929 Non-pressure chronic ulcer of unspecified part of left lower leg with unspecified severity: Secondary | ICD-10-CM

## 2024-01-22 NOTE — Assessment & Plan Note (Signed)
Has been determined that he is not a good candidate now for dialysis, which he initially refused. His wife is requesting a referral to nephrologist for a second opinion and willing to be on dialysis if recommended. He has next appt with his nephrologist 02/15/24.

## 2024-01-22 NOTE — Progress Notes (Signed)
Virtual Visit via Video Note I connected with Douglas Matthews on 01/22/2024 by a video enabled telemedicine application and verified that I am speaking with the correct person using two identifiers. Location patient: home Location provider:work or home office Persons participating in the virtual visit: patient, patient's wife, provider, scribe  I discussed the limitations of evaluation and management by telemedicine and the availability of in person appointments. The patient expressed understanding and agreed to proceed.  Chief Complaint  Patient presents with   Hospitalization Follow-up    Pt is with wife. Wife reports pt had wellness at home. BP was 106/60.   HPI: [name] is a 89 y.o. male with a PMHx significant for CKD V, HTN, HLD, prediabetes, HFrEF, LBBB, atrial fibrillation, and pulmonary HTN, who is being seen on video today for hospitalization follow up. Patient was hospitalized from 2/9-2/12 for worsening SOB and LE edema. TOC call on 01/14/24.  CXR 01/10/24:  Small bilateral pleural effusions and bibasilar atelectasis or infiltrate. Initial treatment IV lasix 120 mg.  Acute on CKD V with metabolic acidosis, started on sodium bicarbonate 1300 mg twice daily.  Nephrology consultation during hospitalization. Not a good candidate for dialysis. Lab Results  Component Value Date   NA 142 01/13/2024   CL 99 01/13/2024   K 4.7 01/13/2024   CO2 15 (L) 01/13/2024   BUN 195 (H) 01/13/2024   CREATININE 9.57 (H) 01/13/2024   GFRNONAA 5 (L) 01/13/2024   CALCIUM 8.6 (L) 01/13/2024   PHOS 11.4 (H) 01/13/2024   ALBUMIN 2.4 (L) 01/13/2024   GLUCOSE 108 (H) 01/13/2024      Latest Ref Rng & Units 01/12/2024    4:42 AM 01/10/2024   12:43 PM 01/05/2024   12:11 PM  CBC  WBC 4.0 - 10.5 K/uL 5.0  4.9  5.3   Hemoglobin 13.0 - 17.0 g/dL 8.4  8.6  8.3 Repeated and verified X2.   Hematocrit 39.0 - 52.0 % 25.9  28.0  25.0   Platelets 150 - 400 K/uL 321  317  283.0    Renal US  01/10/24: 1. Bilateral renal cortical atrophy, with diffuse increased renal cortical echotexture consistent with medical renal disease. 2. Incidental small bilateral pleural effusions and small volume Ascites  He states he is feeling better since hospital discharge. He is still having some SOB with exertion, but says it is slightly improved since his return home.  His appetite is good.  He needs assistance for ADL's. Using a cane, wife helps him with getting dress, and HH helps with bath.  HFrEF: He denies orthopnea and PND, he sleeps with one pillow.  Echo 02/02/23: LVEF 20-25%. On Torsemide 100 mg daily.  Mentions is getting up 3-4 times at night to use the bathroom.  Denies any dysuria or gross hematuria.   PAF: On Amiodarone 200 mg daily. Also endorses some chest tightness at rest with no associated palpitations or diaphoresis. Had elevated troponin at 330 and 321, thought to be caused by demand ischemia.  Hypothyroidism: He is on Levothyroxine 100 mcg daily. Negative for tremor, cold/heat intolerance. His wife also mentions he is losing weight.   Lab Results  Component Value Date   TSH 50.62 (H) 12/14/2023    LE edema is better than while he was in the hospital, but still present.  LLE venous ulcer, still receiving wound care [facility]. PT was interrupted due to hospitalization. On palliative care, according to records, refused hospice care.  Next appointment with nephrology will  be 3/17.  His wife would like a referral for a new nephrologist for a second opinion.   ROS: See pertinent positives and negatives per HPI.  Past Medical History:  Diagnosis Date   Anemia    Atrial fibrillation (HCC)    Atrial flutter (HCC)    BPH (benign prostatic hypertrophy)    Chronic HFrEF (heart failure with reduced ejection fraction) (HCC)    Chronic kidney disease (CKD), active medical management without dialysis, stage 5 (HCC)    Hyperlipidemia    Hypertension    Hypothyroidism     LBBB (left bundle branch block)    Past Surgical History:  Procedure Laterality Date   APPENDECTOMY     CARDIOVERSION N/A 10/29/2022   Procedure: CARDIOVERSION;  Surgeon: Lewayne Bunting, MD;  Location: [facility] ENDOSCOPY;  Service: Cardiovascular;  Laterality: N/A;   CARDIOVERSION N/A 05/21/2023   Procedure: CARDIOVERSION;  Surgeon: [name], MD;  Location: [facility] INVASIVE CV LAB;  Service: Cardiovascular;  Laterality: N/A;   HERNIA REPAIR     TEE WITHOUT CARDIOVERSION N/A 10/29/2022   Procedure: TRANSESOPHAGEAL ECHOCARDIOGRAM (TEE);  Surgeon: Lewayne Bunting, MD;  Location: [facility] ENDOSCOPY;  Service: Cardiovascular;  Laterality: N/A;   Family History  Problem Relation Age of Onset   Diabetes Son    Heart disease Other     Social History   Socioeconomic History   Marital status: Married    Spouse name: Not on file   Number of children: Not on file   Years of education: Not on file   Highest education level: Not on file  Occupational History   Occupation: Retired    Comment: [employer]  Tobacco Use   Smoking status: Never   Smokeless tobacco: Never  Vaping Use   Vaping status: Never Used  Substance and Sexual Activity   Alcohol use: No   Drug use: No   Sexual activity: Not on file  Other Topics Concern   Not on file  Social History Narrative   Married x 57 years    Right handed    Social Drivers of Health   Financial Resource Strain: Low Risk  (07/08/2022)   Overall Financial Resource Strain (CARDIA)    Difficulty of Paying Living Expenses: Not hard at all  Food Insecurity: No Food Insecurity (01/14/2024)   Hunger Vital Sign    Worried About Running Out of Food in the Last Year: Never true    Ran Out of Food in the Last Year: Never true  Transportation Needs: No Transportation Needs (01/14/2024)   PRAPARE - Administrator, Civil Service (Medical): No    Lack of Transportation (Non-Medical): No  Physical Activity: Insufficiently Active (07/08/2022)   Exercise  Vital Sign    Days of Exercise per Week: 2 days    Minutes of Exercise per Session: 30 min  Stress: No Stress Concern Present (07/08/2022)   [employer] of Occupational Health - Occupational Stress Questionnaire    Feeling of Stress : Not at all  Social Connections: Moderately Integrated (01/12/2024)   Social Connection and Isolation Panel [NHANES]    Frequency of Communication with Friends and Family: Three times a week    Frequency of Social Gatherings with Friends and Family: Three times a week    Attends Religious Services: More than 4 times per year    Active Member of Clubs or Organizations: No    Attends Banker Meetings: Never    Marital Status: Married  Catering manager Violence:  Not At Risk (01/14/2024)   Humiliation, Afraid, Rape, and Kick questionnaire    Fear of Current or Ex-Partner: No    Emotionally Abused: No    Physically Abused: No    Sexually Abused: No    Current Outpatient Medications:    albuterol (VENTOLIN HFA) 108 (90 Base) MCG/ACT inhaler, Inhale 2 puffs into the lungs every 6 (six) hours as needed for wheezing or shortness of breath., Disp: 1 each, Rfl: 2   amiodarone (PACERONE) 200 MG tablet, Take 1 tablet (200 mg total) by mouth daily., Disp: 90 tablet, Rfl: 3   calcitRIOL (ROCALTROL) 0.25 MCG capsule, Take 0.25 mcg by mouth every other day., Disp: , Rfl:    levothyroxine (SYNTHROID) 100 MCG tablet, Take 1 tablet (100 mcg total) by mouth daily. (Patient taking differently: Take 100 mcg by mouth daily before breakfast.), Disp: 90 tablet, Rfl: 0   sodium bicarbonate 650 MG tablet, Take 2 tablets (1,300 mg total) by mouth 2 (two) times daily., Disp: 120 tablet, Rfl: 0   torsemide (DEMADEX) 100 MG tablet, Take 1 tablet (100 mg total) by mouth daily., Disp: 90 tablet, Rfl: 1   aluminum sulfate-calcium acetate (DOMEBORO) packet, Apply 1 packet topically 3 (three) times daily. Mixed it in water. (Patient not taking: Reported on 01/14/2024), Disp: 100  each, Rfl: 1  EXAM:  VITALS per patient if applicable:Ht 5\' 8"  (1.727 m)   Wt 125 lb (56.7 kg)   BMI 19.01 kg/m   GENERAL: alert, oriented, appears well and in no acute distress  HEENT: atraumatic, conjunctiva clear, no obvious abnormalities on inspection of external nose and ears  NECK: normal movements of the head and neck  LUNGS: on inspection no signs of respiratory distress, breathing rate appears normal, no obvious gross SOB, gasping or wheezing  CV: no obvious cyanosis  MS: moves all visible extremities without noticeable abnormality  PSYCH/NEURO: pleasant and cooperative, no obvious depression or anxiety, speech and thought processing grossly intact  ASSESSMENT AND PLAN:  Discussed the following assessment and plan:  Metabolic acidosis Assessment & Plan: Currently on sodium bicarbonate 650 mg 2 tabs bid.  Orders: -     Basic metabolic panel; Future -     Magnesium; Future  CKD (chronic kidney disease), stage V (HCC) Assessment & Plan: Has been determined that he is not a good candidate now for dialysis, which he initially refused. His wife is requesting a referral to nephrologist for a second opinion and willing to be on dialysis if recommended. He has next appt with his nephrologist 02/15/24.  Orders: -     Ambulatory referral to Nephrology -     Basic metabolic panel; Future -     CBC; Future  Hypothyroidism due to medication Assessment & Plan: Problem has not  been well controlled. Continue Levothyroxine 100 mcg daily. TSH will be added to next blood work.  Orders: -     TSH; Future  Persistent atrial fibrillation [facility]) Assessment & Plan: On Amiodarone 200 mg daily. Follows with cardiologist.  Venous stasis ulcer of left lower extremity (HCC) According to wife both LE's ulcers have healed, one still left but healing well. Wound care through [facility] to continue. Stressed the importance of adequate protein intake.  We discussed possible serious and  likely etiologies, options for evaluation and workup, limitations of telemedicine visit vs in person visit, treatment, treatment risks and precautions. The patient was advised to call back or seek an in-person evaluation if the symptoms worsen or if the condition  fails to improve as anticipated. I discussed the assessment and treatment plan with the patient. The patient was provided an opportunity to ask questions and all were answered. The patient agreed with the plan and demonstrated an understanding of the instructions.  Return if symptoms worsen or fail to improve, for keep next appointment, Labs.  I, Rolla Etienne Wierda, acting as a scribe for Emelda Kohlbeck [place], MD., have documented all relevant documentation on the behalf of Kimberlea Schlag [place], MD, as directed by  Miliani Deike [place], MD while in the presence of Alisen Marsiglia [place], MD.   I, Karlie Aung [place], MD, have reviewed all documentation for this visit. The documentation on 01/22/24 for the exam, diagnosis, procedures, and orders are all accurate and complete.  Crispin Vogel [place], MD

## 2024-01-22 NOTE — Assessment & Plan Note (Addendum)
On Amiodarone 200 mg daily. Follows with cardiologist.

## 2024-01-22 NOTE — Progress Notes (Signed)
  Subjective:  Patient ID: Douglas Matthews, male    DOB: [birth year],  MRN: [id number]  Chief Complaint  Patient presents with   Nail Problem    Nail trim    89 y.o. male returns for the above complaint.  Patient presents with thickened onychodystrophy mycotic toenails x 10 mild pain on palpation hurts with ambulation is with pressure patient would like for me to debride down he is not able to do it himself  Objective:  There were no vitals filed for this visit. Podiatric Exam: Vascular: dorsalis pedis and posterior tibial pulses are palpable bilateral. Capillary return is immediate. Temperature gradient is WNL. Skin turgor WNL  Sensorium: Normal Semmes Weinstein monofilament test. Normal tactile sensation bilaterally. Nail Exam: Pt has thick disfigured discolored nails with subungual debris noted bilateral entire nail hallux through fifth toenails.  Pain on palpation to the nails. Ulcer Exam: There is no evidence of ulcer or pre-ulcerative changes or infection. Orthopedic Exam: Muscle tone and strength are WNL. No limitations in general ROM. No crepitus or effusions noted.  Skin: No Porokeratosis. No infection or ulcers    Assessment & Plan:  No diagnosis found.  Patient was evaluated and treated and all questions answered.  Onychomycosis with pain  -Nails palliatively debrided as below. -Educated on self-care  Procedure: Nail Debridement Rationale: pain  Type of Debridement: manual, sharp debridement. Instrumentation: Nail nipper, rotary burr. Number of Nails: 10  Procedures and Treatment: Consent by patient was obtained for treatment procedures. The patient understood the discussion of treatment and procedures well. All questions were answered thoroughly reviewed. Debridement of mycotic and hypertrophic toenails, 1 through 5 bilateral and clearing of subungual debris. No ulceration, no infection noted.  Return Visit-Office Procedure: Patient instructed to return to the office  for a follow up visit 3 months for continued evaluation and treatment.  Nicholes Rough, DPM    Return in about 3 months (around 04/20/2024) for Routine Foot Care.

## 2024-01-22 NOTE — Patient Outreach (Signed)
Care Management  Transitions of Care Program Transitions of Care Post-discharge week 2  01/22/2024 Name: TAKASHI KOROL MRN: [id number] DOB: [birth year]  Subjective: DIONTAE ROUTE is a 89 y.o. year old male who is a primary care patient of [place], Timoteo Expose, MD. The Care Management team was unable to reach the patient by phone to assess and address transitions of care needs.   Plan: Additional outreach attempts will be made to reach the patient enrolled in the [facility] Program (Post Inpatient/ED Visit).  Lonia Chimera, RN, BSN, CEN [employer]- Transition of Care Team.  [facility] [phone]

## 2024-01-22 NOTE — Assessment & Plan Note (Signed)
Problem has not  been well controlled. Continue Levothyroxine 100 mcg daily. TSH will be added to next blood work.

## 2024-01-22 NOTE — Assessment & Plan Note (Signed)
Currently on sodium bicarbonate 650 mg 2 tabs bid.

## 2024-01-23 DIAGNOSIS — I5042 Chronic combined systolic (congestive) and diastolic (congestive) heart failure: Secondary | ICD-10-CM | POA: Diagnosis not present

## 2024-01-23 DIAGNOSIS — D631 Anemia in chronic kidney disease: Secondary | ICD-10-CM | POA: Diagnosis not present

## 2024-01-23 DIAGNOSIS — E1122 Type 2 diabetes mellitus with diabetic chronic kidney disease: Secondary | ICD-10-CM | POA: Diagnosis not present

## 2024-01-23 DIAGNOSIS — I482 Chronic atrial fibrillation, unspecified: Secondary | ICD-10-CM | POA: Diagnosis not present

## 2024-01-23 DIAGNOSIS — N185 Chronic kidney disease, stage 5: Secondary | ICD-10-CM | POA: Diagnosis not present

## 2024-01-23 DIAGNOSIS — I872 Venous insufficiency (chronic) (peripheral): Secondary | ICD-10-CM | POA: Diagnosis not present

## 2024-01-23 DIAGNOSIS — D62 Acute posthemorrhagic anemia: Secondary | ICD-10-CM | POA: Diagnosis not present

## 2024-01-23 DIAGNOSIS — E1151 Type 2 diabetes mellitus with diabetic peripheral angiopathy without gangrene: Secondary | ICD-10-CM | POA: Diagnosis not present

## 2024-01-23 DIAGNOSIS — G4733 Obstructive sleep apnea (adult) (pediatric): Secondary | ICD-10-CM | POA: Diagnosis not present

## 2024-01-23 DIAGNOSIS — I4892 Unspecified atrial flutter: Secondary | ICD-10-CM | POA: Diagnosis not present

## 2024-01-23 DIAGNOSIS — I447 Left bundle-branch block, unspecified: Secondary | ICD-10-CM | POA: Diagnosis not present

## 2024-01-23 DIAGNOSIS — K922 Gastrointestinal hemorrhage, unspecified: Secondary | ICD-10-CM | POA: Diagnosis not present

## 2024-01-23 DIAGNOSIS — I132 Hypertensive heart and chronic kidney disease with heart failure and with stage 5 chronic kidney disease, or end stage renal disease: Secondary | ICD-10-CM | POA: Diagnosis not present

## 2024-01-23 DIAGNOSIS — I272 Pulmonary hypertension, unspecified: Secondary | ICD-10-CM | POA: Diagnosis not present

## 2024-01-23 DIAGNOSIS — M47814 Spondylosis without myelopathy or radiculopathy, thoracic region: Secondary | ICD-10-CM | POA: Diagnosis not present

## 2024-01-23 DIAGNOSIS — M25462 Effusion, left knee: Secondary | ICD-10-CM | POA: Diagnosis not present

## 2024-01-23 DIAGNOSIS — M170 Bilateral primary osteoarthritis of knee: Secondary | ICD-10-CM | POA: Diagnosis not present

## 2024-01-23 DIAGNOSIS — L89892 Pressure ulcer of other site, stage 2: Secondary | ICD-10-CM | POA: Diagnosis not present

## 2024-01-23 DIAGNOSIS — M25461 Effusion, right knee: Secondary | ICD-10-CM | POA: Diagnosis not present

## 2024-01-23 DIAGNOSIS — E038 Other specified hypothyroidism: Secondary | ICD-10-CM | POA: Diagnosis not present

## 2024-01-23 DIAGNOSIS — I890 Lymphedema, not elsewhere classified: Secondary | ICD-10-CM | POA: Diagnosis not present

## 2024-01-23 DIAGNOSIS — J449 Chronic obstructive pulmonary disease, unspecified: Secondary | ICD-10-CM | POA: Diagnosis not present

## 2024-01-23 DIAGNOSIS — E785 Hyperlipidemia, unspecified: Secondary | ICD-10-CM | POA: Diagnosis not present

## 2024-01-23 DIAGNOSIS — N179 Acute kidney failure, unspecified: Secondary | ICD-10-CM | POA: Diagnosis not present

## 2024-01-23 DIAGNOSIS — M4184 Other forms of scoliosis, thoracic region: Secondary | ICD-10-CM | POA: Diagnosis not present

## 2024-01-26 ENCOUNTER — Telehealth: Payer: Self-pay

## 2024-01-26 DIAGNOSIS — N179 Acute kidney failure, unspecified: Secondary | ICD-10-CM | POA: Diagnosis not present

## 2024-01-26 DIAGNOSIS — I872 Venous insufficiency (chronic) (peripheral): Secondary | ICD-10-CM | POA: Diagnosis not present

## 2024-01-26 DIAGNOSIS — L89892 Pressure ulcer of other site, stage 2: Secondary | ICD-10-CM | POA: Diagnosis not present

## 2024-01-26 DIAGNOSIS — K922 Gastrointestinal hemorrhage, unspecified: Secondary | ICD-10-CM | POA: Diagnosis not present

## 2024-01-26 DIAGNOSIS — I890 Lymphedema, not elsewhere classified: Secondary | ICD-10-CM | POA: Diagnosis not present

## 2024-01-26 DIAGNOSIS — D62 Acute posthemorrhagic anemia: Secondary | ICD-10-CM | POA: Diagnosis not present

## 2024-01-26 NOTE — Telephone Encounter (Signed)
 Copied from CRM [id number]. Topic: Clinical - Home Health Verbal Orders >> Jan 26, 2024  1:47 PM Truddie Crumble wrote: Caller/Agency: stefanie from [facility] Callback Number: [phone] Service Requested: Occupational Therapy Frequency: one time a week for four weeks Any new concerns about the patient? No   Stephane want to make sure that when the office call to leave a full name and credentials because her voicemail is secure

## 2024-01-26 NOTE — Telephone Encounter (Signed)
 VO given to Stefanie.

## 2024-01-26 NOTE — Patient Instructions (Signed)
 Visit Information  Thank you for taking time to visit with me today. Please don't hesitate to contact me if I can be of assistance to you before our next scheduled telephone appointment.  Following are the goals we discussed today:   Goals Addressed               This Visit's Progress     COMPLETED: Patient and or wife will verbalize no readmission to the hospital in the next 30 days. (pt-stated)        Current Barriers:  Provider appointments No PCP follow up appointment scheduled. 01/26/24 Patient has completed his hospital follow up.  Wife reports patient is doing about the same. "Not better no worse" Home Health services Home health to start today ( 01/14/2024).  01/26/2024   Wife reports home health has started and there are no issues with home health at this time ESRD and CHF.  Patient not a candidate for dialysis per chart.  No plan in place for increased in shortness of breath. Patient states that he does not want to go back to the hospital.  01/26/2024  Wife reports patient has a follow up scheduled in March with nephrologist.   RNCM Clinical Goal(s):  Patient will work with the Care Management team over the next 30 days to address Transition of Care Barriers: Provider appointments Home Health services verbalize understanding of plan for management of CHF and ESRD as evidenced by wife and patient able to verbalize understanding  take all medications exactly as prescribed and will call provider for medication related questions as evidenced by patient report attend all scheduled medical appointments: PCP and specialist as evidenced by review of EMR and patient report  through collaboration with RN Care manager, provider, and care team.   Interventions: Evaluation of current treatment plan related to  self management and patient's adherence to plan as established by provider  Transitions of Care:  Patient declined further engagement on this goal. Doctor Visits  - discussed the  importance of doctor visits. Confirmed hospital follow up completed Reviewed Signs and symptoms of infection Encouraged patient and wife to follow a low salt diet.  Reviewed importance of taking all medications as prescribed  Heart Failure Interventions:  (Status:  Patient declined further engagement on this goal.) Short Term Goal  Encouraged patient to continue to call MD for worsening conditions Reviewed with wife to encourage patient to take all his medications as prescribed.   Patient Goals/Self-Care Activities: Take all medications as prescribed Attend all scheduled provider appointments Call provider office for new concerns or questions  call office if I gain more than 2 pounds in one day or 5 pounds in one week use salt in moderation weigh myself daily know when to call the doctor:for weight gain or increased shortness of breath Patient to keep follow up planned with home health RN  Follow Up Plan:   Wife denies an additional needs at this time. Wife states she will call me if needed.            If you are experiencing a Mental Health or Behavioral Health Crisis or need someone to talk to, please call the Suicide and Crisis Lifeline: 988 call the [place] National Suicide Prevention Lifeline: (413) 220-2018 or TTY: (305)585-0337 TTY 716-854-0917) to talk to a trained counselor call 1-800-273-TALK (toll free, 24 hour hotline) call 911   Patient verbalizes understanding of instructions and care plan provided today and agrees to view in MyChart. Active MyChart status and patient  understanding of how to access instructions and care plan via MyChart confirmed with patient.      Lonia Chimera, RN, BSN, CEN [employer]- Transition of Care Team.  [facility] [phone]

## 2024-01-26 NOTE — Patient Outreach (Signed)
 Care Management  Transitions of Care Program Transitions of Care Post-discharge week 2   01/26/2024 Name: Douglas Matthews MRN: [id number] DOB: 11/07/1935  Subjective: Douglas Matthews is a 89 y.o. year old male who is a primary care patient of [place], Timoteo Expose, MD. The Care Management team Engaged with patient  wife  to assess and address transitions of care needs.   Consent to Services:  Patient was given information about care management services, agreed to services, and gave verbal consent to participate.   Assessment: Spoke with wife who states that patient is about the same. No better and no worse.  Wife states that home health nurse and OT visited today. Wife reports patient is asleep and resting. Wife states that patient is taking his medications as prescribed.  Wife states she feels they are self managing well.  Wife denies any additional calls needed. I provided my contact information and encouraged wife to call me if needed.           SDOH Interventions    Flowsheet Row Telephone from 01/14/2024 in [facility] ED to Hosp-Admission (Discharged) from 01/10/2024 in [facility] Care Coordination from 09/07/2023 in [facility] Care Coordination Care Coordination from 02/16/2023 in [facility] Care Coordination Telephone from 02/09/2023 in [facility] Care Coordination Telephone from 02/02/2023 in [facility] Care Coordination  SDOH Interventions        Food Insecurity Interventions Intervention Not Indicated -- -- -- Intervention Not Indicated Intervention Not Indicated  Housing Interventions Intervention Not Indicated -- Intervention Not Indicated Intervention Not Indicated -- --  Transportation Interventions Intervention Not Indicated Intervention Not Indicated, Inpatient TOC, Patient Resources Dietitian) Intervention Not Indicated Intervention  Not Indicated Intervention Not Indicated Intervention Not Indicated  Utilities Interventions Intervention Not Indicated -- -- Intervention Not Indicated -- --        Goals Addressed               This Visit's Progress     COMPLETED: Patient and or wife will verbalize no readmission to the hospital in the next 30 days. (pt-stated)        Current Barriers:  Provider appointments No PCP follow up appointment scheduled. 01/26/24 Patient has completed his hospital follow up.  Wife reports patient is doing about the same. "Not better no worse" Home Health services Home health to start today ( 01/14/2024).  01/26/2024   Wife reports home health has started and there are no issues with home health at this time ESRD and CHF.  Patient not a candidate for dialysis per chart.  No plan in place for increased in shortness of breath. Patient states that he does not want to go back to the hospital.  01/26/2024  Wife reports patient has a follow up scheduled in March with nephrologist.   RNCM Clinical Goal(s):  Patient will work with the Care Management team over the next 30 days to address Transition of Care Barriers: Provider appointments Home Health services verbalize understanding of plan for management of CHF and ESRD as evidenced by wife and patient able to verbalize understanding  take all medications exactly as prescribed and will call provider for medication related questions as evidenced by patient report attend all scheduled medical appointments: PCP and specialist as evidenced by review of EMR and patient report  through collaboration with RN Care manager, provider, and care team.   Interventions: Evaluation of current treatment  plan related to  self management and patient's adherence to plan as established by provider  Transitions of Care:  Patient declined further engagement on this goal. Doctor Visits  - discussed the importance of doctor visits. Confirmed hospital follow up  completed Reviewed Signs and symptoms of infection Encouraged patient and wife to follow a low salt diet.  Reviewed importance of taking all medications as prescribed  Heart Failure Interventions:  (Status:  Patient declined further engagement on this goal.) Short Term Goal  Encouraged patient to continue to call MD for worsening conditions Reviewed with wife to encourage patient to take all his medications as prescribed.   Patient Goals/Self-Care Activities: Take all medications as prescribed Attend all scheduled provider appointments Call provider office for new concerns or questions  call office if I gain more than 2 pounds in one day or 5 pounds in one week use salt in moderation weigh myself daily know when to call the doctor:for weight gain or increased shortness of breath Patient to keep follow up planned with home health RN  Follow Up Plan:   Wife denies an additional needs at this time. Wife states she will call me if needed.        Plan:  Wife denies any additional calls needed. She reports she will call me if needed. Home health nurse and OT out to see patient today.   Lonia Chimera, RN, BSN, CEN [employer]- Transition of Care Team.  [facility] [phone]

## 2024-01-27 ENCOUNTER — Other Ambulatory Visit (INDEPENDENT_AMBULATORY_CARE_PROVIDER_SITE_OTHER): Payer: Medicare Other

## 2024-01-27 ENCOUNTER — Telehealth: Payer: Self-pay

## 2024-01-27 ENCOUNTER — Telehealth: Payer: Self-pay | Admitting: Family Medicine

## 2024-01-27 DIAGNOSIS — I872 Venous insufficiency (chronic) (peripheral): Secondary | ICD-10-CM | POA: Diagnosis not present

## 2024-01-27 DIAGNOSIS — E872 Acidosis, unspecified: Secondary | ICD-10-CM | POA: Diagnosis not present

## 2024-01-27 DIAGNOSIS — N179 Acute kidney failure, unspecified: Secondary | ICD-10-CM | POA: Diagnosis not present

## 2024-01-27 DIAGNOSIS — E032 Hypothyroidism due to medicaments and other exogenous substances: Secondary | ICD-10-CM

## 2024-01-27 DIAGNOSIS — I890 Lymphedema, not elsewhere classified: Secondary | ICD-10-CM | POA: Diagnosis not present

## 2024-01-27 DIAGNOSIS — R319 Hematuria, unspecified: Secondary | ICD-10-CM

## 2024-01-27 DIAGNOSIS — N185 Chronic kidney disease, stage 5: Secondary | ICD-10-CM | POA: Diagnosis not present

## 2024-01-27 DIAGNOSIS — D62 Acute posthemorrhagic anemia: Secondary | ICD-10-CM | POA: Diagnosis not present

## 2024-01-27 DIAGNOSIS — K922 Gastrointestinal hemorrhage, unspecified: Secondary | ICD-10-CM | POA: Diagnosis not present

## 2024-01-27 DIAGNOSIS — L89892 Pressure ulcer of other site, stage 2: Secondary | ICD-10-CM | POA: Diagnosis not present

## 2024-01-27 LAB — URINALYSIS, ROUTINE W REFLEX MICROSCOPIC
Bilirubin Urine: NEGATIVE
Ketones, ur: NEGATIVE
Leukocytes,Ua: NEGATIVE
Nitrite: NEGATIVE
Specific Gravity, Urine: 1.015 (ref 1.000–1.030)
Total Protein, Urine: NEGATIVE
Urine Glucose: NEGATIVE
Urobilinogen, UA: 0.2 (ref 0.0–1.0)
pH: 5.5 (ref 5.0–8.0)

## 2024-01-27 LAB — MAGNESIUM: Magnesium: 2.1 mg/dL (ref 1.5–2.5)

## 2024-01-27 LAB — POC URINALSYSI DIPSTICK (AUTOMATED)
Bilirubin, UA: NEGATIVE
Blood, UA: POSITIVE
Glucose, UA: NEGATIVE
Ketones, UA: NEGATIVE
Leukocytes, UA: NEGATIVE
Nitrite, UA: NEGATIVE
Protein, UA: POSITIVE — AB
Spec Grav, UA: 1.015 (ref 1.010–1.025)
Urobilinogen, UA: 0.2 U/dL
pH, UA: 5 (ref 5.0–8.0)

## 2024-01-27 LAB — TSH: TSH: 28.06 u[IU]/mL — ABNORMAL HIGH (ref 0.35–5.50)

## 2024-01-27 LAB — BASIC METABOLIC PANEL
BUN: 188 mg/dL (ref 6–23)
CO2: 25 meq/L (ref 19–32)
Calcium: 7.7 mg/dL — ABNORMAL LOW (ref 8.4–10.5)
Chloride: 100 meq/L (ref 96–112)
Creatinine, Ser: 9.37 mg/dL (ref 0.40–1.50)
GFR: 4.66 mL/min — CL (ref >60.00)
Glucose, Bld: 86 mg/dL (ref 70–99)
Potassium: 4 meq/L (ref 3.5–5.1)
Sodium: 144 meq/L (ref 135–145)

## 2024-01-27 LAB — CBC
HCT: 25.4 % — ABNORMAL LOW (ref 39.0–52.0)
Hemoglobin: 8.1 g/dL — ABNORMAL LOW (ref 13.0–17.0)
MCHC: 31.8 g/dL (ref 30.0–36.0)
MCV: 83.7 fL (ref 78.0–100.0)
Platelets: 170 10*3/uL (ref 150.0–400.0)
RBC: 3.03 Mil/uL — ABNORMAL LOW (ref 4.22–5.81)
RDW: 17.3 % — ABNORMAL HIGH (ref 11.5–15.5)
WBC: 3.8 10*3/uL — ABNORMAL LOW (ref 4.0–10.5)

## 2024-01-27 NOTE — Telephone Encounter (Signed)
 Patient came in for labs, patients wife came over and asked for an appointment for Douglas Matthews as he has blood in his urine.  First appointment available with Dr [place] is on Monday March 3rd.  Mrs Cruces would advice on what to do.  Please advise at [phone].

## 2024-01-27 NOTE — Telephone Encounter (Signed)
 Copied from CRM [id number]. Topic: Clinical - Medical Advice >> Jan 27, 2024  3:43 PM Orinda Kenner C wrote: Reason for CRM: Dois Davenport nurse from [facility] home health [phone] patient is having trouble with the urinal and wants a condom catheters to use instead? Please advise.

## 2024-01-27 NOTE — Addendum Note (Signed)
 Addended by: Kathreen Devoid on: 01/27/2024 10:32 AM   Modules accepted: Orders

## 2024-01-27 NOTE — Telephone Encounter (Signed)
 I added the urine orders since he was already here for labs. He does have an appt scheduled for Monday, but I can try and work him in somewhere on Friday depending on what the results show.

## 2024-01-27 NOTE — Telephone Encounter (Signed)
 Critical results:  BUN: 188 CR: 9.37 GFR: 4.66

## 2024-01-27 NOTE — Telephone Encounter (Signed)
 Stable otherwise. See lab result note. BJ

## 2024-01-28 DIAGNOSIS — K922 Gastrointestinal hemorrhage, unspecified: Secondary | ICD-10-CM | POA: Diagnosis not present

## 2024-01-28 DIAGNOSIS — I872 Venous insufficiency (chronic) (peripheral): Secondary | ICD-10-CM | POA: Diagnosis not present

## 2024-01-28 DIAGNOSIS — D62 Acute posthemorrhagic anemia: Secondary | ICD-10-CM | POA: Diagnosis not present

## 2024-01-28 DIAGNOSIS — L89892 Pressure ulcer of other site, stage 2: Secondary | ICD-10-CM | POA: Diagnosis not present

## 2024-01-28 DIAGNOSIS — I890 Lymphedema, not elsewhere classified: Secondary | ICD-10-CM | POA: Diagnosis not present

## 2024-01-28 DIAGNOSIS — N179 Acute kidney failure, unspecified: Secondary | ICD-10-CM | POA: Diagnosis not present

## 2024-01-28 LAB — URINE CULTURE
MICRO NUMBER:: 16131952
SPECIMEN QUALITY:: ADEQUATE

## 2024-01-29 ENCOUNTER — Telehealth: Payer: Self-pay | Admitting: Family Medicine

## 2024-01-29 ENCOUNTER — Other Ambulatory Visit: Payer: Self-pay

## 2024-01-29 DIAGNOSIS — L89892 Pressure ulcer of other site, stage 2: Secondary | ICD-10-CM | POA: Diagnosis not present

## 2024-01-29 DIAGNOSIS — K922 Gastrointestinal hemorrhage, unspecified: Secondary | ICD-10-CM | POA: Diagnosis not present

## 2024-01-29 DIAGNOSIS — I872 Venous insufficiency (chronic) (peripheral): Secondary | ICD-10-CM | POA: Diagnosis not present

## 2024-01-29 DIAGNOSIS — N179 Acute kidney failure, unspecified: Secondary | ICD-10-CM | POA: Diagnosis not present

## 2024-01-29 DIAGNOSIS — D62 Acute posthemorrhagic anemia: Secondary | ICD-10-CM | POA: Diagnosis not present

## 2024-01-29 DIAGNOSIS — I890 Lymphedema, not elsewhere classified: Secondary | ICD-10-CM | POA: Diagnosis not present

## 2024-01-29 MED ORDER — LEVOTHYROXINE SODIUM 112 MCG PO TABS
112.0000 ug | ORAL_TABLET | Freq: Every day | ORAL | 1 refills | Status: DC
Start: 2024-01-29 — End: 2024-03-01

## 2024-01-29 NOTE — Telephone Encounter (Signed)
 It is ok to change to condom cath. Thanks, BJ

## 2024-01-29 NOTE — Telephone Encounter (Signed)
 I spoke with Dois Davenport, she is aware it is okay to order for pt.

## 2024-01-29 NOTE — Telephone Encounter (Signed)
 Copied from CRM [id number]. Topic: General - Other >> Jan 29, 2024 11:16 AM Alvino Blood C wrote: Reason for CRM: Dois Davenport is requesting a Condom cast that will assist in draining the patients urine at night Nurse's call back # is [phone]

## 2024-01-29 NOTE — Telephone Encounter (Signed)
 I left Douglas Matthews a voicemail letting her know it is okay to change to the condom cath.

## 2024-02-01 ENCOUNTER — Ambulatory Visit: Payer: Medicare Other | Admitting: Family Medicine

## 2024-02-01 ENCOUNTER — Ambulatory Visit (HOSPITAL_COMMUNITY)
Admission: RE | Admit: 2024-02-01 | Discharge: 2024-02-01 | Disposition: A | Discharge status: Home / Self Care | Payer: Medicare Other | Source: Ambulatory Visit | Attending: Nephrology | Admitting: Nephrology

## 2024-02-01 VITALS — BP 105/69 | HR 87 | Temp 97.1°F | Resp 17

## 2024-02-01 DIAGNOSIS — N185 Chronic kidney disease, stage 5: Secondary | ICD-10-CM | POA: Insufficient documentation

## 2024-02-01 DIAGNOSIS — D631 Anemia in chronic kidney disease: Secondary | ICD-10-CM | POA: Diagnosis not present

## 2024-02-01 LAB — IRON AND TIBC
Iron: 31 ug/dL — ABNORMAL LOW (ref 45–182)
Saturation Ratios: 10 % — ABNORMAL LOW (ref 17.9–39.5)
TIBC: 325 ug/dL (ref 250–450)
UIBC: 294 ug/dL

## 2024-02-01 LAB — RENAL FUNCTION PANEL
Albumin: 2.5 g/dL — ABNORMAL LOW (ref 3.5–5.0)
Anion gap: 19 — ABNORMAL HIGH (ref 5–15)
BUN: 181 mg/dL — ABNORMAL HIGH (ref 8–23)
CO2: 24 mmol/L (ref 22–32)
Calcium: 8 mg/dL — ABNORMAL LOW (ref 8.9–10.3)
Chloride: 100 mmol/L (ref 98–111)
Creatinine, Ser: 9.53 mg/dL — ABNORMAL HIGH (ref 0.61–1.24)
GFR, Estimated: 5 mL/min — ABNORMAL LOW (ref >60)
Glucose, Bld: 99 mg/dL (ref 70–99)
Phosphorus: 8.9 mg/dL — ABNORMAL HIGH (ref 2.5–4.6)
Potassium: 4.1 mmol/L (ref 3.5–5.1)
Sodium: 143 mmol/L (ref 135–145)

## 2024-02-01 LAB — POCT HEMOGLOBIN-HEMACUE: Hemoglobin: 8.1 g/dL — ABNORMAL LOW (ref 13.0–17.0)

## 2024-02-01 LAB — FERRITIN: Ferritin: 66 ng/mL (ref 24–336)

## 2024-02-01 MED ORDER — DARBEPOETIN ALFA 60 MCG/0.3ML IJ SOSY
PREFILLED_SYRINGE | INTRAMUSCULAR | Status: AC
  Filled 2024-02-01: qty 0.3

## 2024-02-01 MED ORDER — DARBEPOETIN ALFA 60 MCG/0.3ML IJ SOSY
60.0000 ug | PREFILLED_SYRINGE | INTRAMUSCULAR | Status: DC
  Administered 2024-02-01: 60 ug via SUBCUTANEOUS

## 2024-02-02 DIAGNOSIS — D62 Acute posthemorrhagic anemia: Secondary | ICD-10-CM | POA: Diagnosis not present

## 2024-02-02 DIAGNOSIS — K922 Gastrointestinal hemorrhage, unspecified: Secondary | ICD-10-CM | POA: Diagnosis not present

## 2024-02-02 DIAGNOSIS — I890 Lymphedema, not elsewhere classified: Secondary | ICD-10-CM | POA: Diagnosis not present

## 2024-02-02 DIAGNOSIS — I872 Venous insufficiency (chronic) (peripheral): Secondary | ICD-10-CM | POA: Diagnosis not present

## 2024-02-02 DIAGNOSIS — L89892 Pressure ulcer of other site, stage 2: Secondary | ICD-10-CM | POA: Diagnosis not present

## 2024-02-02 DIAGNOSIS — N179 Acute kidney failure, unspecified: Secondary | ICD-10-CM | POA: Diagnosis not present

## 2024-02-03 ENCOUNTER — Encounter: Payer: Self-pay | Admitting: Family Medicine

## 2024-02-03 ENCOUNTER — Ambulatory Visit: Admitting: Family Medicine

## 2024-02-03 VITALS — BP 100/60 | HR 82 | Resp 16 | Ht 68.0 in | Wt 130.0 lb

## 2024-02-03 DIAGNOSIS — L299 Pruritus, unspecified: Secondary | ICD-10-CM | POA: Diagnosis not present

## 2024-02-03 DIAGNOSIS — R310 Gross hematuria: Secondary | ICD-10-CM | POA: Diagnosis not present

## 2024-02-03 DIAGNOSIS — Z7189 Other specified counseling: Secondary | ICD-10-CM | POA: Diagnosis not present

## 2024-02-03 DIAGNOSIS — N185 Chronic kidney disease, stage 5: Secondary | ICD-10-CM

## 2024-02-03 MED ORDER — TRIAMCINOLONE 0.1 % CREAM:EUCERIN CREAM 1:1: 1.0000 | TOPICAL_CREAM | Freq: Two times a day (BID) | CUTANEOUS | 1 refills | Status: DC | PRN

## 2024-02-03 NOTE — Progress Notes (Signed)
 ACUTE VISIT Chief Complaint  Patient presents with   Medical Management of Chronic Issues    Have not seen anymore blood in the urine; did have a nose bleed one day    HPI: Douglas Matthews is a 89 y.o. male with a PMHx significant for CKD V, HTN, HLD, prediabetes, hypothyroidism, HFrEF, LBBB, atrial fibrillation, and pulmonary HTN, who is here today with his wife to follow on recent episode of hematuria as described above.  He noted a few streaks of blood in his urine on 2/28. He has also had 2 nosebleeds in the last 3-4 weeks, which only take a few minutes to control. The most recent nosebleed was shortly before he saw the blood in his urine.  Pertinent negatives include increased bruising, blood in his stool, black stools, penile discharge, dysuria, or abdominal pain.  No hx of nephrolithiasis. He has not had another episode of gross hematuria.  Wife reports his appetite he decreased, he states that he has lost ~30 lbs over the last year. His wife is trying to cook more at home. Some foods are not appealing, has many food restrictions due to co morbilities.  CKD V:  He had an infusion on 3/3 with his nephrologist.  Anemia of renal disease on Aranesp Reports DOE, no associated CP or diaphoresis.  He has an appointment in early April for a second opinion on prognosis for his kidney disease at [facility].   HFrEF with LVEF 20-25% in 01/2023. Negative for orthopnea or PND. LE edema has improved.  Lab Results  Component Value Date   NA 143 02/01/2024   CL 100 02/01/2024   K 4.1 02/01/2024   CO2 24 02/01/2024   BUN 181 (H) 02/01/2024   CREATININE 9.53 (H) 02/01/2024   GFRNONAA 5 (L) 02/01/2024   CALCIUM 8.0 (L) 02/01/2024   PHOS 8.9 (H) 02/01/2024   ALBUMIN 2.5 (L) 02/01/2024   GLUCOSE 99 02/01/2024   Wife also reports his shoulders have been itching very badly lately. Denies rash.  Problem has been going on for several months, initially thought to be caused by Eliquis but  problem did not improve after discontinuing medication. He has not identified exacerbating factors.  Review of Systems  Constitutional:  Positive for activity change, appetite change and fatigue.  HENT:  Negative for mouth sores, sore throat and trouble swallowing.   Respiratory:  Negative for cough and wheezing.   Cardiovascular:  Negative for chest pain and palpitations.  Gastrointestinal:  Negative for nausea and vomiting.  Endocrine: Negative for cold intolerance and heat intolerance.  Neurological:  Negative for syncope and headaches.  Psychiatric/Behavioral:  Negative for confusion and hallucinations.   See other pertinent positives and negatives in HPI.  Current Outpatient Medications on File Prior to Visit  Medication Sig Dispense Refill   albuterol (VENTOLIN HFA) 108 (90 Base) MCG/ACT inhaler Inhale 2 puffs into the lungs every 6 (six) hours as needed for wheezing or shortness of breath. 1 each 2   amiodarone (PACERONE) 200 MG tablet Take 1 tablet (200 mg total) by mouth daily. 90 tablet 3   calcitRIOL (ROCALTROL) 0.25 MCG capsule Take 0.25 mcg by mouth every other day.     levothyroxine (SYNTHROID) 112 MCG tablet Take 1 tablet (112 mcg total) by mouth daily. 30 tablet 1   sodium bicarbonate 650 MG tablet Take 2 tablets (1,300 mg total) by mouth 2 (two) times daily. 120 tablet 0   torsemide (DEMADEX) 100 MG tablet Take 1 tablet (100  mg total) by mouth daily. 90 tablet 1   aluminum sulfate-calcium acetate (DOMEBORO) packet Apply 1 packet topically 3 (three) times daily. Mixed it in water. (Patient not taking: Reported on 01/14/2024) 100 each 1   No current facility-administered medications on file prior to visit.    Past Medical History:  Diagnosis Date   Anemia    Atrial fibrillation (HCC)    Atrial flutter (HCC)    BPH (benign prostatic hypertrophy)    Chronic HFrEF (heart failure with reduced ejection fraction) (HCC)    Chronic kidney disease (CKD), active medical  management without dialysis, stage 5 (HCC)    Hyperlipidemia    Hypertension    Hypothyroidism    LBBB (left bundle branch block)    No Known Allergies  Social History   Socioeconomic History   Marital status: Married    Spouse name: Not on file   Number of children: Not on file   Years of education: Not on file   Highest education level: Not on file  Occupational History   Occupation: Retired    Comment: [employer]  Tobacco Use   Smoking status: Never   Smokeless tobacco: Never  Vaping Use   Vaping status: Never Used  Substance and Sexual Activity   Alcohol use: No   Drug use: No   Sexual activity: Not on file  Other Topics Concern   Not on file  Social History Narrative   Married x 57 years    Right handed    Social Drivers of Health   Financial Resource Strain: Low Risk  (07/08/2022)   Overall Financial Resource Strain (CARDIA)    Difficulty of Paying Living Expenses: Not hard at all  Food Insecurity: No Food Insecurity (01/14/2024)   Hunger Vital Sign    Worried About Running Out of Food in the Last Year: Never true    Ran Out of Food in the Last Year: Never true  Transportation Needs: No Transportation Needs (01/14/2024)   PRAPARE - Administrator, Civil Service (Medical): No    Lack of Transportation (Non-Medical): No  Physical Activity: Insufficiently Active (07/08/2022)   Exercise Vital Sign    Days of Exercise per Week: 2 days    Minutes of Exercise per Session: 30 min  Stress: No Stress Concern Present (07/08/2022)   [employer] of Occupational Health - Occupational Stress Questionnaire    Feeling of Stress : Not at all  Social Connections: Moderately Integrated (01/12/2024)   Social Connection and Isolation Panel [NHANES]    Frequency of Communication with Friends and Family: Three times a week    Frequency of Social Gatherings with Friends and Family: Three times a week    Attends Religious Services: More than 4 times per year    Active Member  of Clubs or Organizations: No    Attends Banker Meetings: Never    Marital Status: Married    Vitals:   02/03/24 1513  BP: 100/60  Pulse: 82  Resp: 16  SpO2: 98%   Body mass index is 19.77 kg/m.  Physical Exam Vitals and nursing note reviewed.  Constitutional:      General: He is not in acute distress.    Appearance: He is well-developed.  HENT:     Head: Normocephalic and atraumatic.  Eyes:     Conjunctiva/sclera: Conjunctivae normal.  Cardiovascular:     Rate and Rhythm: Normal rate and regular rhythm.     Heart sounds: No murmur heard.  Comments: Trace bilateral LE edema Pulmonary:     Effort: Pulmonary effort is normal. No respiratory distress.     Breath sounds: Normal breath sounds.  Abdominal:     Palpations: Abdomen is soft.     Tenderness: There is no abdominal tenderness.  Musculoskeletal:     Right lower leg: Edema present.     Left lower leg: Edema present.  Lymphadenopathy:     Cervical: No cervical adenopathy.  Skin:    General: Skin is warm.     Findings: No erythema or rash.  Neurological:     General: No focal deficit present.     Mental Status: He is alert and oriented to person, place, and time.     Comments: Unstable gait assisted with a cane.  Psychiatric:        Mood and Affect: Mood and affect normal.    ASSESSMENT AND PLAN:  Mr. Manna was seen today for hematuria.   Skin pruritus We discussed possible etiologies. Could be related to renal disease. Recommend eucerin with triamcinolone 1:1 bid prn.  -     triamcinolone 0.1 % cream : eucerin; Apply 1 Application topically 2 (two) times daily as needed for rash or itching.  Dispense: 500 each; Refill: 1  Gross hematuria One episode. We discussed possible causes, including more serious bleeder disease. Given his co morbilities I do not recommend further work up or urology consultation at this time. If recurrent, we can re-evaluate.  CKD (chronic kidney disease),  stage V (HCC) Last Cr 9.5 and e GFR 5. Prognosis is poor. Initially declined dialysis and at this time he does not seem to be a good candidate. Pending appt with a new nephrologist in 03/2024. He has an appt scheduled with his nephrologist. Stressed the importance of adequate protein intake. Reviewed dietary recommendations.  Goals of care, counseling/discussion He is not longer receiving palliative care services. He has an appt with nephrologist for a second opinion, requested by family. After this appt we need to discuss and decide about goals of care, which may include stopping blood work, dietary restrictions, and admission to hospice care among some.  Return if symptoms worsen or fail to improve.  I, Rolla Etienne Wierda, acting as a scribe for Topanga Alvelo [place], MD., have documented all relevant documentation on the behalf of Joscelynn Brutus [place], MD, as directed by  Keysi Oelkers [place], MD while in the presence of Naiyana Barbian [place], MD.   I, Briannie Gutierrez [place], MD, have reviewed all documentation for this visit. The documentation on 02/04/24 for the exam, diagnosis, procedures, and orders are all accurate and complete.  Lyrick Lagrand G. [place], MD  [facility]. [place] office.

## 2024-02-03 NOTE — Patient Instructions (Addendum)
 A few things to remember from today's visit:  Skin pruritus - Plan: Triamcinolone Acetonide (TRIAMCINOLONE 0.1 % CREAM : EUCERIN) CREA  Gross hematuria Apply cream on ara that itches.  If you need refills for medications you take chronically, please call your pharmacy. Do not use My Chart to request refills or for acute issues that need immediate attention. If you send a my chart message, it may take a few days to be addressed, specially if I am not in the office.  Please be sure medication list is accurate. If a new problem present, please set up appointment sooner than planned today.

## 2024-02-04 ENCOUNTER — Telehealth: Payer: Self-pay | Admitting: Family Medicine

## 2024-02-04 DIAGNOSIS — I872 Venous insufficiency (chronic) (peripheral): Secondary | ICD-10-CM | POA: Diagnosis not present

## 2024-02-04 DIAGNOSIS — L89892 Pressure ulcer of other site, stage 2: Secondary | ICD-10-CM | POA: Diagnosis not present

## 2024-02-04 DIAGNOSIS — N185 Chronic kidney disease, stage 5: Secondary | ICD-10-CM

## 2024-02-04 DIAGNOSIS — N179 Acute kidney failure, unspecified: Secondary | ICD-10-CM | POA: Diagnosis not present

## 2024-02-04 DIAGNOSIS — I890 Lymphedema, not elsewhere classified: Secondary | ICD-10-CM | POA: Diagnosis not present

## 2024-02-04 DIAGNOSIS — D62 Acute posthemorrhagic anemia: Secondary | ICD-10-CM | POA: Diagnosis not present

## 2024-02-04 DIAGNOSIS — K922 Gastrointestinal hemorrhage, unspecified: Secondary | ICD-10-CM | POA: Diagnosis not present

## 2024-02-04 NOTE — Telephone Encounter (Signed)
 Copied from CRM [id number]. Topic: Referral - Question >> Feb 04, 2024 10:40 AM [name] wrote: Reason for CRM: Spouse calling on behalf of patient requesting to speak to Dr.[name]'s nurse in regard to a referral - states she already knows what the referral is for, she just wants to give more information.

## 2024-02-05 DIAGNOSIS — D62 Acute posthemorrhagic anemia: Secondary | ICD-10-CM | POA: Diagnosis not present

## 2024-02-05 DIAGNOSIS — K922 Gastrointestinal hemorrhage, unspecified: Secondary | ICD-10-CM | POA: Diagnosis not present

## 2024-02-05 DIAGNOSIS — I872 Venous insufficiency (chronic) (peripheral): Secondary | ICD-10-CM | POA: Diagnosis not present

## 2024-02-05 DIAGNOSIS — N179 Acute kidney failure, unspecified: Secondary | ICD-10-CM | POA: Diagnosis not present

## 2024-02-05 DIAGNOSIS — I890 Lymphedema, not elsewhere classified: Secondary | ICD-10-CM | POA: Diagnosis not present

## 2024-02-05 DIAGNOSIS — L89892 Pressure ulcer of other site, stage 2: Secondary | ICD-10-CM | POA: Diagnosis not present

## 2024-02-05 NOTE — Telephone Encounter (Signed)
 I called and spoke with pt's wife. She was wanting to know if we could try for an office closer to [facility], as [facility] would be a far drive for her to take him. I did advise her that [facility] has a Nephrology office in [facility] and we can send the referral over there. She will call and cancel the appointment at [facility]. I did advise her that they would reach out to get him scheduled. She did ask for it to be soon as they are not sure how much time pt has left. I did mark the referral urgent.

## 2024-02-08 ENCOUNTER — Telehealth: Payer: Self-pay | Admitting: Family Medicine

## 2024-02-08 DIAGNOSIS — N185 Chronic kidney disease, stage 5: Secondary | ICD-10-CM

## 2024-02-08 NOTE — Telephone Encounter (Signed)
 Noted, spoke with them Friday.

## 2024-02-08 NOTE — Telephone Encounter (Signed)
 Copied from CRM [id number]. Topic: General - Other >> Feb 08, 2024 10:11 AM Eunice Blase wrote: Reason for CRM: Received call from [facility] Jae Dire [phone] keep receiving a referral..Can't see pt until end of June 2025.

## 2024-02-09 DIAGNOSIS — I890 Lymphedema, not elsewhere classified: Secondary | ICD-10-CM | POA: Diagnosis not present

## 2024-02-09 DIAGNOSIS — K922 Gastrointestinal hemorrhage, unspecified: Secondary | ICD-10-CM | POA: Diagnosis not present

## 2024-02-09 DIAGNOSIS — L89892 Pressure ulcer of other site, stage 2: Secondary | ICD-10-CM | POA: Diagnosis not present

## 2024-02-09 DIAGNOSIS — I872 Venous insufficiency (chronic) (peripheral): Secondary | ICD-10-CM | POA: Diagnosis not present

## 2024-02-09 DIAGNOSIS — D62 Acute posthemorrhagic anemia: Secondary | ICD-10-CM | POA: Diagnosis not present

## 2024-02-09 DIAGNOSIS — N179 Acute kidney failure, unspecified: Secondary | ICD-10-CM | POA: Diagnosis not present

## 2024-02-10 DIAGNOSIS — I872 Venous insufficiency (chronic) (peripheral): Secondary | ICD-10-CM | POA: Diagnosis not present

## 2024-02-10 DIAGNOSIS — D62 Acute posthemorrhagic anemia: Secondary | ICD-10-CM | POA: Diagnosis not present

## 2024-02-10 DIAGNOSIS — N179 Acute kidney failure, unspecified: Secondary | ICD-10-CM | POA: Diagnosis not present

## 2024-02-10 DIAGNOSIS — I890 Lymphedema, not elsewhere classified: Secondary | ICD-10-CM | POA: Diagnosis not present

## 2024-02-10 DIAGNOSIS — K922 Gastrointestinal hemorrhage, unspecified: Secondary | ICD-10-CM | POA: Diagnosis not present

## 2024-02-10 DIAGNOSIS — L89892 Pressure ulcer of other site, stage 2: Secondary | ICD-10-CM | POA: Diagnosis not present

## 2024-02-10 NOTE — Telephone Encounter (Signed)
 I reached back out to patient's wife to update her and let her know where the status was on the referral. They will keep the appt with [facility] on 6/18, but would still like to schedule with [facility] and be put on the waitlist.

## 2024-02-10 NOTE — Addendum Note (Signed)
 Addended by: Kathreen Devoid on: 02/10/2024 03:10 PM   Modules accepted: Orders

## 2024-02-11 DIAGNOSIS — D62 Acute posthemorrhagic anemia: Secondary | ICD-10-CM | POA: Diagnosis not present

## 2024-02-11 DIAGNOSIS — I890 Lymphedema, not elsewhere classified: Secondary | ICD-10-CM | POA: Diagnosis not present

## 2024-02-11 DIAGNOSIS — I872 Venous insufficiency (chronic) (peripheral): Secondary | ICD-10-CM | POA: Diagnosis not present

## 2024-02-11 DIAGNOSIS — L89892 Pressure ulcer of other site, stage 2: Secondary | ICD-10-CM | POA: Diagnosis not present

## 2024-02-11 DIAGNOSIS — N179 Acute kidney failure, unspecified: Secondary | ICD-10-CM | POA: Diagnosis not present

## 2024-02-11 DIAGNOSIS — K922 Gastrointestinal hemorrhage, unspecified: Secondary | ICD-10-CM | POA: Diagnosis not present

## 2024-02-15 DIAGNOSIS — D631 Anemia in chronic kidney disease: Secondary | ICD-10-CM | POA: Diagnosis not present

## 2024-02-15 DIAGNOSIS — D638 Anemia in other chronic diseases classified elsewhere: Secondary | ICD-10-CM | POA: Diagnosis not present

## 2024-02-15 DIAGNOSIS — I5082 Biventricular heart failure: Secondary | ICD-10-CM | POA: Diagnosis not present

## 2024-02-15 DIAGNOSIS — I120 Hypertensive chronic kidney disease with stage 5 chronic kidney disease or end stage renal disease: Secondary | ICD-10-CM | POA: Diagnosis not present

## 2024-02-15 DIAGNOSIS — N2581 Secondary hyperparathyroidism of renal origin: Secondary | ICD-10-CM | POA: Diagnosis not present

## 2024-02-15 DIAGNOSIS — N185 Chronic kidney disease, stage 5: Secondary | ICD-10-CM | POA: Diagnosis not present

## 2024-02-17 ENCOUNTER — Telehealth: Payer: Self-pay

## 2024-02-17 DIAGNOSIS — I872 Venous insufficiency (chronic) (peripheral): Secondary | ICD-10-CM | POA: Diagnosis not present

## 2024-02-17 DIAGNOSIS — L89892 Pressure ulcer of other site, stage 2: Secondary | ICD-10-CM | POA: Diagnosis not present

## 2024-02-17 DIAGNOSIS — L299 Pruritus, unspecified: Secondary | ICD-10-CM

## 2024-02-17 DIAGNOSIS — I890 Lymphedema, not elsewhere classified: Secondary | ICD-10-CM | POA: Diagnosis not present

## 2024-02-17 DIAGNOSIS — D62 Acute posthemorrhagic anemia: Secondary | ICD-10-CM | POA: Diagnosis not present

## 2024-02-17 DIAGNOSIS — N179 Acute kidney failure, unspecified: Secondary | ICD-10-CM | POA: Diagnosis not present

## 2024-02-17 DIAGNOSIS — K922 Gastrointestinal hemorrhage, unspecified: Secondary | ICD-10-CM | POA: Diagnosis not present

## 2024-02-17 NOTE — Telephone Encounter (Signed)
 Copied from CRM [id number]. Topic: General - Other >> Feb 17, 2024  9:05 AM Aletta Edouard wrote: Reason for CRM: patient wife is calling in requesting a referral for a dermatologist for patient legs patient would like to be referred to  dr Langston Reusing

## 2024-02-19 ENCOUNTER — Telehealth: Payer: Self-pay

## 2024-02-19 DIAGNOSIS — K922 Gastrointestinal hemorrhage, unspecified: Secondary | ICD-10-CM | POA: Diagnosis not present

## 2024-02-19 DIAGNOSIS — I890 Lymphedema, not elsewhere classified: Secondary | ICD-10-CM | POA: Diagnosis not present

## 2024-02-19 DIAGNOSIS — N179 Acute kidney failure, unspecified: Secondary | ICD-10-CM | POA: Diagnosis not present

## 2024-02-19 DIAGNOSIS — I872 Venous insufficiency (chronic) (peripheral): Secondary | ICD-10-CM | POA: Diagnosis not present

## 2024-02-19 DIAGNOSIS — D62 Acute posthemorrhagic anemia: Secondary | ICD-10-CM | POA: Diagnosis not present

## 2024-02-19 DIAGNOSIS — L89892 Pressure ulcer of other site, stage 2: Secondary | ICD-10-CM | POA: Diagnosis not present

## 2024-02-19 NOTE — Telephone Encounter (Signed)
 I left Douglas Matthews a voicemail letting her know the VO is approved.

## 2024-02-19 NOTE — Telephone Encounter (Signed)
 Copied from CRM [id number]. Topic: General - Other >> Feb 19, 2024 12:58 PM Alcus Dad wrote: Reason for CRM: Judeth Cornfield from [facility] wanted to give Dr. Lorenda Peck for OT:  1x a week for 4 weeks.

## 2024-02-22 DIAGNOSIS — E038 Other specified hypothyroidism: Secondary | ICD-10-CM | POA: Diagnosis not present

## 2024-02-22 DIAGNOSIS — M170 Bilateral primary osteoarthritis of knee: Secondary | ICD-10-CM | POA: Diagnosis not present

## 2024-02-22 DIAGNOSIS — N401 Enlarged prostate with lower urinary tract symptoms: Secondary | ICD-10-CM | POA: Diagnosis not present

## 2024-02-22 DIAGNOSIS — I4892 Unspecified atrial flutter: Secondary | ICD-10-CM | POA: Diagnosis not present

## 2024-02-22 DIAGNOSIS — E785 Hyperlipidemia, unspecified: Secondary | ICD-10-CM | POA: Diagnosis not present

## 2024-02-22 DIAGNOSIS — E1151 Type 2 diabetes mellitus with diabetic peripheral angiopathy without gangrene: Secondary | ICD-10-CM | POA: Diagnosis not present

## 2024-02-22 DIAGNOSIS — M25462 Effusion, left knee: Secondary | ICD-10-CM | POA: Diagnosis not present

## 2024-02-22 DIAGNOSIS — H539 Unspecified visual disturbance: Secondary | ICD-10-CM | POA: Diagnosis not present

## 2024-02-22 DIAGNOSIS — I272 Pulmonary hypertension, unspecified: Secondary | ICD-10-CM | POA: Diagnosis not present

## 2024-02-22 DIAGNOSIS — E1122 Type 2 diabetes mellitus with diabetic chronic kidney disease: Secondary | ICD-10-CM | POA: Diagnosis not present

## 2024-02-22 DIAGNOSIS — L89892 Pressure ulcer of other site, stage 2: Secondary | ICD-10-CM | POA: Diagnosis not present

## 2024-02-22 DIAGNOSIS — J449 Chronic obstructive pulmonary disease, unspecified: Secondary | ICD-10-CM | POA: Diagnosis not present

## 2024-02-22 DIAGNOSIS — M25461 Effusion, right knee: Secondary | ICD-10-CM | POA: Diagnosis not present

## 2024-02-22 DIAGNOSIS — M4184 Other forms of scoliosis, thoracic region: Secondary | ICD-10-CM | POA: Diagnosis not present

## 2024-02-22 DIAGNOSIS — I872 Venous insufficiency (chronic) (peripheral): Secondary | ICD-10-CM | POA: Diagnosis not present

## 2024-02-22 DIAGNOSIS — I890 Lymphedema, not elsewhere classified: Secondary | ICD-10-CM | POA: Diagnosis not present

## 2024-02-22 DIAGNOSIS — I132 Hypertensive heart and chronic kidney disease with heart failure and with stage 5 chronic kidney disease, or end stage renal disease: Secondary | ICD-10-CM | POA: Diagnosis not present

## 2024-02-22 DIAGNOSIS — D631 Anemia in chronic kidney disease: Secondary | ICD-10-CM | POA: Diagnosis not present

## 2024-02-22 DIAGNOSIS — I5042 Chronic combined systolic (congestive) and diastolic (congestive) heart failure: Secondary | ICD-10-CM | POA: Diagnosis not present

## 2024-02-22 DIAGNOSIS — N39498 Other specified urinary incontinence: Secondary | ICD-10-CM | POA: Diagnosis not present

## 2024-02-22 DIAGNOSIS — M47814 Spondylosis without myelopathy or radiculopathy, thoracic region: Secondary | ICD-10-CM | POA: Diagnosis not present

## 2024-02-22 DIAGNOSIS — G4733 Obstructive sleep apnea (adult) (pediatric): Secondary | ICD-10-CM | POA: Diagnosis not present

## 2024-02-22 DIAGNOSIS — I482 Chronic atrial fibrillation, unspecified: Secondary | ICD-10-CM | POA: Diagnosis not present

## 2024-02-22 DIAGNOSIS — N185 Chronic kidney disease, stage 5: Secondary | ICD-10-CM | POA: Diagnosis not present

## 2024-02-22 DIAGNOSIS — I447 Left bundle-branch block, unspecified: Secondary | ICD-10-CM | POA: Diagnosis not present

## 2024-02-23 DIAGNOSIS — I5042 Chronic combined systolic (congestive) and diastolic (congestive) heart failure: Secondary | ICD-10-CM | POA: Diagnosis not present

## 2024-02-23 DIAGNOSIS — E1122 Type 2 diabetes mellitus with diabetic chronic kidney disease: Secondary | ICD-10-CM | POA: Diagnosis not present

## 2024-02-23 DIAGNOSIS — I890 Lymphedema, not elsewhere classified: Secondary | ICD-10-CM | POA: Diagnosis not present

## 2024-02-23 DIAGNOSIS — I872 Venous insufficiency (chronic) (peripheral): Secondary | ICD-10-CM | POA: Diagnosis not present

## 2024-02-23 DIAGNOSIS — I132 Hypertensive heart and chronic kidney disease with heart failure and with stage 5 chronic kidney disease, or end stage renal disease: Secondary | ICD-10-CM | POA: Diagnosis not present

## 2024-02-23 DIAGNOSIS — L89892 Pressure ulcer of other site, stage 2: Secondary | ICD-10-CM | POA: Diagnosis not present

## 2024-02-25 ENCOUNTER — Other Ambulatory Visit (HOSPITAL_COMMUNITY): Payer: Self-pay | Admitting: *Deleted

## 2024-02-25 DIAGNOSIS — R801 Persistent proteinuria, unspecified: Secondary | ICD-10-CM | POA: Diagnosis not present

## 2024-02-25 DIAGNOSIS — I120 Hypertensive chronic kidney disease with stage 5 chronic kidney disease or end stage renal disease: Secondary | ICD-10-CM | POA: Diagnosis not present

## 2024-02-25 DIAGNOSIS — N185 Chronic kidney disease, stage 5: Secondary | ICD-10-CM | POA: Diagnosis not present

## 2024-02-25 DIAGNOSIS — E8729 Other acidosis: Secondary | ICD-10-CM | POA: Diagnosis not present

## 2024-02-28 ENCOUNTER — Other Ambulatory Visit: Payer: Self-pay

## 2024-02-28 ENCOUNTER — Encounter (HOSPITAL_COMMUNITY): Payer: Self-pay | Admitting: Emergency Medicine

## 2024-02-28 ENCOUNTER — Inpatient Hospital Stay (HOSPITAL_COMMUNITY)
Admission: EM | Admit: 2024-02-28 | Discharge: 2024-03-01 | DRG: 951 | Disposition: E | Attending: Internal Medicine | Admitting: Internal Medicine

## 2024-02-28 ENCOUNTER — Emergency Department (HOSPITAL_COMMUNITY)

## 2024-02-28 DIAGNOSIS — D709 Neutropenia, unspecified: Secondary | ICD-10-CM | POA: Diagnosis present

## 2024-02-28 DIAGNOSIS — J90 Pleural effusion, not elsewhere classified: Secondary | ICD-10-CM | POA: Diagnosis not present

## 2024-02-28 DIAGNOSIS — E032 Hypothyroidism due to medicaments and other exogenous substances: Secondary | ICD-10-CM

## 2024-02-28 DIAGNOSIS — E785 Hyperlipidemia, unspecified: Secondary | ICD-10-CM | POA: Diagnosis not present

## 2024-02-28 DIAGNOSIS — E872 Acidosis, unspecified: Secondary | ICD-10-CM | POA: Diagnosis present

## 2024-02-28 DIAGNOSIS — E161 Other hypoglycemia: Secondary | ICD-10-CM | POA: Diagnosis not present

## 2024-02-28 DIAGNOSIS — M889 Osteitis deformans of unspecified bone: Secondary | ICD-10-CM | POA: Diagnosis not present

## 2024-02-28 DIAGNOSIS — I10 Essential (primary) hypertension: Secondary | ICD-10-CM | POA: Diagnosis not present

## 2024-02-28 DIAGNOSIS — R351 Nocturia: Secondary | ICD-10-CM | POA: Diagnosis present

## 2024-02-28 DIAGNOSIS — E039 Hypothyroidism, unspecified: Secondary | ICD-10-CM | POA: Diagnosis present

## 2024-02-28 DIAGNOSIS — N179 Acute kidney failure, unspecified: Secondary | ICD-10-CM | POA: Diagnosis not present

## 2024-02-28 DIAGNOSIS — E875 Hyperkalemia: Secondary | ICD-10-CM | POA: Diagnosis present

## 2024-02-28 DIAGNOSIS — R64 Cachexia: Secondary | ICD-10-CM | POA: Diagnosis present

## 2024-02-28 DIAGNOSIS — Z833 Family history of diabetes mellitus: Secondary | ICD-10-CM | POA: Diagnosis not present

## 2024-02-28 DIAGNOSIS — I5082 Biventricular heart failure: Secondary | ICD-10-CM | POA: Diagnosis present

## 2024-02-28 DIAGNOSIS — D631 Anemia in chronic kidney disease: Secondary | ICD-10-CM | POA: Diagnosis present

## 2024-02-28 DIAGNOSIS — Z79890 Hormone replacement therapy (postmenopausal): Secondary | ICD-10-CM

## 2024-02-28 DIAGNOSIS — I482 Chronic atrial fibrillation, unspecified: Secondary | ICD-10-CM | POA: Diagnosis present

## 2024-02-28 DIAGNOSIS — N185 Chronic kidney disease, stage 5: Secondary | ICD-10-CM | POA: Diagnosis present

## 2024-02-28 DIAGNOSIS — I447 Left bundle-branch block, unspecified: Secondary | ICD-10-CM | POA: Diagnosis present

## 2024-02-28 DIAGNOSIS — R54 Age-related physical debility: Secondary | ICD-10-CM | POA: Diagnosis present

## 2024-02-28 DIAGNOSIS — J189 Pneumonia, unspecified organism: Secondary | ICD-10-CM | POA: Diagnosis present

## 2024-02-28 DIAGNOSIS — Z66 Do not resuscitate: Secondary | ICD-10-CM | POA: Diagnosis not present

## 2024-02-28 DIAGNOSIS — Z515 Encounter for palliative care: Secondary | ICD-10-CM | POA: Diagnosis not present

## 2024-02-28 DIAGNOSIS — I132 Hypertensive heart and chronic kidney disease with heart failure and with stage 5 chronic kidney disease, or end stage renal disease: Secondary | ICD-10-CM | POA: Diagnosis present

## 2024-02-28 DIAGNOSIS — R6520 Severe sepsis without septic shock: Secondary | ICD-10-CM | POA: Diagnosis not present

## 2024-02-28 DIAGNOSIS — A419 Sepsis, unspecified organism: Principal | ICD-10-CM

## 2024-02-28 DIAGNOSIS — E162 Hypoglycemia, unspecified: Secondary | ICD-10-CM | POA: Diagnosis present

## 2024-02-28 DIAGNOSIS — I5020 Unspecified systolic (congestive) heart failure: Secondary | ICD-10-CM | POA: Diagnosis not present

## 2024-02-28 DIAGNOSIS — N401 Enlarged prostate with lower urinary tract symptoms: Secondary | ICD-10-CM | POA: Diagnosis not present

## 2024-02-28 DIAGNOSIS — R918 Other nonspecific abnormal finding of lung field: Secondary | ICD-10-CM | POA: Diagnosis not present

## 2024-02-28 DIAGNOSIS — Z7189 Other specified counseling: Secondary | ICD-10-CM | POA: Diagnosis not present

## 2024-02-28 DIAGNOSIS — J811 Chronic pulmonary edema: Secondary | ICD-10-CM | POA: Diagnosis not present

## 2024-02-28 DIAGNOSIS — I4891 Unspecified atrial fibrillation: Secondary | ICD-10-CM | POA: Diagnosis not present

## 2024-02-28 DIAGNOSIS — R112 Nausea with vomiting, unspecified: Secondary | ICD-10-CM | POA: Diagnosis present

## 2024-02-28 DIAGNOSIS — J440 Chronic obstructive pulmonary disease with acute lower respiratory infection: Secondary | ICD-10-CM | POA: Diagnosis present

## 2024-02-28 DIAGNOSIS — R0602 Shortness of breath: Secondary | ICD-10-CM | POA: Diagnosis not present

## 2024-02-28 DIAGNOSIS — Z79899 Other long term (current) drug therapy: Secondary | ICD-10-CM

## 2024-02-28 DIAGNOSIS — R0902 Hypoxemia: Secondary | ICD-10-CM | POA: Diagnosis not present

## 2024-02-28 DIAGNOSIS — I5042 Chronic combined systolic (congestive) and diastolic (congestive) heart failure: Secondary | ICD-10-CM | POA: Diagnosis present

## 2024-02-28 DIAGNOSIS — I959 Hypotension, unspecified: Principal | ICD-10-CM

## 2024-02-28 LAB — COMPREHENSIVE METABOLIC PANEL WITH GFR
ALT: 111 U/L — ABNORMAL HIGH (ref 0–44)
AST: 276 U/L — ABNORMAL HIGH (ref 15–41)
Albumin: 2.3 g/dL — ABNORMAL LOW (ref 3.5–5.0)
Alkaline Phosphatase: 176 U/L — ABNORMAL HIGH (ref 38–126)
Anion gap: 27 — ABNORMAL HIGH (ref 5–15)
BUN: 239 mg/dL — ABNORMAL HIGH (ref 8–23)
CO2: 15 mmol/L — ABNORMAL LOW (ref 22–32)
Calcium: 8.3 mg/dL — ABNORMAL LOW (ref 8.9–10.3)
Chloride: 104 mmol/L (ref 98–111)
Creatinine, Ser: 13.01 mg/dL — ABNORMAL HIGH (ref 0.61–1.24)
GFR, Estimated: 3 mL/min — ABNORMAL LOW (ref >60)
Glucose, Bld: 150 mg/dL — ABNORMAL HIGH (ref 70–99)
Potassium: 5.3 mmol/L — ABNORMAL HIGH (ref 3.5–5.1)
Sodium: 146 mmol/L — ABNORMAL HIGH (ref 135–145)
Total Bilirubin: 2.1 mg/dL — ABNORMAL HIGH (ref 0.0–1.2)
Total Protein: 6.3 g/dL — ABNORMAL LOW (ref 6.5–8.1)

## 2024-02-28 LAB — CBC WITH DIFFERENTIAL/PLATELET
Abs Immature Granulocytes: 0.02 10*3/uL (ref 0.00–0.07)
Basophils Absolute: 0 10*3/uL (ref 0.0–0.1)
Basophils Relative: 0 %
Eosinophils Absolute: 0 10*3/uL (ref 0.0–0.5)
Eosinophils Relative: 0 %
HCT: 27.3 % — ABNORMAL LOW (ref 39.0–52.0)
Hemoglobin: 8.5 g/dL — ABNORMAL LOW (ref 13.0–17.0)
Immature Granulocytes: 1 %
Lymphocytes Relative: 6 %
Lymphs Abs: 0.1 10*3/uL — ABNORMAL LOW (ref 0.7–4.0)
MCH: 25.6 pg — ABNORMAL LOW (ref 26.0–34.0)
MCHC: 31.1 g/dL (ref 30.0–36.0)
MCV: 82.2 fL (ref 80.0–100.0)
Monocytes Absolute: 0.1 10*3/uL (ref 0.1–1.0)
Monocytes Relative: 7 %
Neutro Abs: 1.5 10*3/uL — ABNORMAL LOW (ref 1.7–7.7)
Neutrophils Relative %: 86 %
Platelets: 177 10*3/uL (ref 150–400)
RBC: 3.32 MIL/uL — ABNORMAL LOW (ref 4.22–5.81)
RDW: 20.2 % — ABNORMAL HIGH (ref 11.5–15.5)
Smear Review: NORMAL
WBC: 1.8 10*3/uL — ABNORMAL LOW (ref 4.0–10.5)
nRBC: 1.7 % — ABNORMAL HIGH (ref 0.0–0.2)

## 2024-02-28 LAB — MAGNESIUM: Magnesium: 2.3 mg/dL (ref 1.7–2.4)

## 2024-02-28 LAB — PROTIME-INR
INR: 2.1 — ABNORMAL HIGH (ref 0.8–1.2)
Prothrombin Time: 24.2 s — ABNORMAL HIGH (ref 11.4–15.2)

## 2024-02-28 LAB — I-STAT CG4 LACTIC ACID, ED
Lactic Acid, Venous: 5.6 mmol/L (ref 0.5–1.9)
Lactic Acid, Venous: 5.3 mmol/L (ref 0.5–1.9)

## 2024-02-28 LAB — URINALYSIS, W/ REFLEX TO CULTURE (INFECTION SUSPECTED)
RBC / HPF: >50 RBC/hpf (ref 0–5)
WBC, UA: >50 WBC/hpf (ref 0–5)

## 2024-02-28 LAB — GLUCOSE, CAPILLARY: Glucose-Capillary: 96 mg/dL (ref 70–99)

## 2024-02-28 LAB — APTT: aPTT: 37 s — ABNORMAL HIGH (ref 24–36)

## 2024-02-28 LAB — CBG MONITORING, ED: Glucose-Capillary: 73 mg/dL (ref 70–99)

## 2024-02-28 LAB — BRAIN NATRIURETIC PEPTIDE: B Natriuretic Peptide: 1814.5 pg/mL — ABNORMAL HIGH (ref 0.0–100.0)

## 2024-02-28 MED ORDER — METRONIDAZOLE 500 MG/100ML IV SOLN: 500.0000 mg | Freq: Two times a day (BID) | INTRAVENOUS | Status: DC

## 2024-02-28 MED ORDER — DEXTROSE 50 % IV SOLN
12.5000 g | Freq: Once | INTRAVENOUS | Status: AC
  Administered 2024-02-28: 12.5 g via INTRAVENOUS
  Filled 2024-02-28: qty 50

## 2024-02-28 MED ORDER — SODIUM CHLORIDE 0.9 % IV SOLN: 1.0000 g | INTRAVENOUS | Status: DC

## 2024-02-28 MED ORDER — HALOPERIDOL LACTATE 2 MG/ML PO CONC: 0.5000 mg | ORAL | Status: DC | PRN

## 2024-02-28 MED ORDER — VANCOMYCIN VARIABLE DOSE PER UNSTABLE RENAL FUNCTION (PHARMACIST DOSING): Status: DC

## 2024-02-28 MED ORDER — HALOPERIDOL LACTATE 5 MG/ML IJ SOLN: 0.5000 mg | INTRAMUSCULAR | Status: DC | PRN

## 2024-02-28 MED ORDER — LORAZEPAM 2 MG/ML IJ SOLN: 1.0000 mg | INTRAMUSCULAR | Status: DC | PRN

## 2024-02-28 MED ORDER — ALBUTEROL SULFATE (2.5 MG/3ML) 0.083% IN NEBU: 3.0000 mL | INHALATION_SOLUTION | Freq: Four times a day (QID) | RESPIRATORY_TRACT | Status: DC | PRN

## 2024-02-28 MED ORDER — HEPARIN SODIUM (PORCINE) 5000 UNIT/ML IJ SOLN
5000.0000 [IU] | Freq: Three times a day (TID) | INTRAMUSCULAR | Status: DC
  Administered 2024-02-28: 5000 [IU] via SUBCUTANEOUS
  Filled 2024-02-28: qty 1

## 2024-02-28 MED ORDER — LACTATED RINGERS IV BOLUS (SEPSIS)
800.0000 mL | Freq: Once | INTRAVENOUS | Status: AC
  Administered 2024-02-28: 800 mL via INTRAVENOUS

## 2024-02-28 MED ORDER — MORPHINE BOLUS VIA INFUSION: 1.0000 mg | INTRAVENOUS | Status: DC | PRN

## 2024-02-28 MED ORDER — LACTATED RINGERS IV BOLUS
500.0000 mL | Freq: Once | INTRAVENOUS | Status: AC
Start: 2024-02-28 — End: 2024-02-28
  Administered 2024-02-28: 500 mL via INTRAVENOUS

## 2024-02-28 MED ORDER — SODIUM CHLORIDE 0.9% FLUSH
3.0000 mL | Freq: Two times a day (BID) | INTRAVENOUS | Status: DC
  Administered 2024-02-28: 3 mL via INTRAVENOUS

## 2024-02-28 MED ORDER — HALOPERIDOL 0.5 MG PO TABS: 0.5000 mg | ORAL_TABLET | ORAL | Status: DC | PRN

## 2024-02-28 MED ORDER — MORPHINE 100MG IN NS 100ML (1MG/ML) PREMIX INFUSION
1.0000 mg/h | INTRAVENOUS | Status: DC
  Administered 2024-02-28: 1 mg/h via INTRAVENOUS
  Filled 2024-02-28: qty 100

## 2024-02-28 MED ORDER — ACETAMINOPHEN 325 MG PO TABS: 650.0000 mg | ORAL_TABLET | Freq: Four times a day (QID) | ORAL | Status: DC | PRN

## 2024-02-28 MED ORDER — POLYETHYLENE GLYCOL 3350 17 G PO PACK: 17.0000 g | PACK | Freq: Every day | ORAL | Status: DC | PRN

## 2024-02-28 MED ORDER — METRONIDAZOLE 500 MG/100ML IV SOLN
500.0000 mg | Freq: Once | INTRAVENOUS | Status: AC
  Administered 2024-02-28: 500 mg via INTRAVENOUS
  Filled 2024-02-28: qty 100

## 2024-02-28 MED ORDER — SODIUM CHLORIDE 0.9 % IV SOLN
2.0000 g | Freq: Once | INTRAVENOUS | Status: AC
  Administered 2024-02-28: 2 g via INTRAVENOUS
  Filled 2024-02-28: qty 12.5

## 2024-02-28 MED ORDER — ONDANSETRON 4 MG PO TBDP: 4.0000 mg | ORAL_TABLET | Freq: Four times a day (QID) | ORAL | Status: DC | PRN

## 2024-02-28 MED ORDER — ONDANSETRON HCL 4 MG/2ML IJ SOLN: 4.0000 mg | Freq: Four times a day (QID) | INTRAMUSCULAR | Status: DC | PRN

## 2024-02-28 MED ORDER — LORAZEPAM 2 MG/ML PO CONC: 1.0000 mg | ORAL | Status: DC | PRN

## 2024-02-28 MED ORDER — ACETAMINOPHEN 650 MG RE SUPP: 650.0000 mg | Freq: Four times a day (QID) | RECTAL | Status: DC | PRN

## 2024-02-28 MED ORDER — MIDODRINE HCL 5 MG PO TABS
2.5000 mg | ORAL_TABLET | Freq: Three times a day (TID) | ORAL | Status: DC
  Filled 2024-02-28: qty 1

## 2024-02-28 MED ORDER — ATROPINE SULFATE 1 % OP SOLN: 4.0000 [drp] | OPHTHALMIC | Status: DC | PRN

## 2024-02-28 MED ORDER — POLYVINYL ALCOHOL 1.4 % OP SOLN: 1.0000 [drp] | Freq: Four times a day (QID) | OPHTHALMIC | Status: DC | PRN

## 2024-02-28 MED ORDER — VANCOMYCIN HCL IN DEXTROSE 1-5 GM/200ML-% IV SOLN
1000.0000 mg | Freq: Once | INTRAVENOUS | Status: AC
  Administered 2024-02-28: 1000 mg via INTRAVENOUS
  Filled 2024-02-28: qty 200

## 2024-02-28 MED ORDER — ORAL CARE MOUTH RINSE: 15.0000 mL | OROMUCOSAL | Status: DC | PRN

## 2024-02-28 MED ORDER — LACTATED RINGERS IV SOLN: INTRAVENOUS | Status: DC

## 2024-02-28 MED ORDER — LEVOTHYROXINE SODIUM 112 MCG PO TABS: 112.0000 ug | ORAL_TABLET | Freq: Every day | ORAL | Status: DC

## 2024-02-28 MED ORDER — LORAZEPAM 1 MG PO TABS: 1.0000 mg | ORAL_TABLET | ORAL | Status: DC | PRN

## 2024-02-28 MED ORDER — ALBUTEROL SULFATE (2.5 MG/3ML) 0.083% IN NEBU: 2.5000 mg | INHALATION_SOLUTION | RESPIRATORY_TRACT | Status: DC | PRN

## 2024-02-28 MED ORDER — MORPHINE SULFATE (PF) 2 MG/ML IV SOLN
2.0000 mg | Freq: Once | INTRAVENOUS | Status: AC
  Administered 2024-02-28: 2 mg via INTRAVENOUS
  Filled 2024-02-28: qty 1

## 2024-02-28 MED ORDER — LACTATED RINGERS IV BOLUS (SEPSIS)
500.0000 mL | Freq: Once | INTRAVENOUS | Status: AC
  Administered 2024-02-28: 500 mL via INTRAVENOUS

## 2024-02-28 NOTE — Progress Notes (Signed)
 Pharmacy Antibiotic Note  Douglas Matthews is a 89 y.o. male for which pharmacy has been consulted for cefepime and vancomycin dosing for sepsis.  Patient with a history of HTN, HLD, hypothyroidism, AF, CKD, anemia, BPH, HF, Paget's disease. Patient presenting with N/V/SOB.  SCr 13.01 - acutely elevated from baseline WBC 1.8; LA 5.3; T 97.1; HR 67; RR 13  Plan: Metronidazole per MD Cefepime 1g q24hr  Vancomycin 1000 mg once, subsequent dosing as indicated per random vancomycin level until renal function stable and/or improved, at which time scheduled dosing can be considered Monitor WBC, fever, renal function, cultures De-escalate when able F/u Nephrology plan     Temp (24hrs), Avg:97.1 F (36.2 C), Min:97.1 F (36.2 C), Max:97.1 F (36.2 C)  Recent Labs  Lab 02/28/24 1140 02/28/24 1204 02/28/24 1409  WBC 1.8*  --   --   CREATININE 13.01*  --   --   LATICACIDVEN  --  5.6* 5.3*    CrCl cannot be calculated (Unknown ideal weight.).    No Known Allergies  Microbiology results: Pending  Thank you for allowing pharmacy to be a part of this patient's care.  Delmar Landau, PharmD, BCPS 02/28/2024 3:15 PM ED Clinical Pharmacist -  [phone]

## 2024-02-28 NOTE — ED Provider Notes (Signed)
 [place] EMERGENCY DEPARTMENT AT [facility] Provider Note   CSN: [id number] Arrival date & time: 02/28/24  1134     History {Add pertinent medical, surgical, social history, OB history to HPI:1} No chief complaint on file.   LORENSO QUIRINO is a 89 y.o. male.  HPI 89 year old male history of A-fib, stage V kidney disease, hypertension, hyperlipidemia, followed by palliative care, presents today with nausea and vomiting that began yesterday.  Patient with report did worsening dyspnea on exertion.  Per EMS blood pressure was 86/50 and given 500 cc of fluid prehospital with no significant change in blood pressure.  CBG was 63 and he was given instant glucose by EMS orally and additional 150 cc of D10.  CBG was reported to be 73 on his arrival here.  Wife and patient report generalized weakness.    Home Medications Prior to Admission medications   Medication Sig Start Date End Date Taking? Authorizing Provider  albuterol (VENTOLIN HFA) 108 (90 Base) MCG/ACT inhaler Inhale 2 puffs into the lungs every 6 (six) hours as needed for wheezing or shortness of breath. 01/13/24   Pokhrel, Rebekah [place], MD  aluminum sulfate-calcium acetate (DOMEBORO) packet Apply 1 packet topically 3 (three) times daily. Mixed it in water. Patient not taking: Reported on 01/14/2024 12/14/23   [place], [name], MD  amiodarone (PACERONE) 200 MG tablet Take 1 tablet (200 mg total) by mouth daily. 09/03/23   Gaston Islam., NP  calcitRIOL (ROCALTROL) 0.25 MCG capsule Take 0.25 mcg by mouth every other day.    [provider]  levothyroxine (SYNTHROID) 112 MCG tablet Take 1 tablet (112 mcg total) by mouth daily. 01/29/24   [place], [name], MD  sodium bicarbonate 650 MG tablet Take 2 tablets (1,300 mg total) by mouth 2 (two) times daily. 01/13/24   Pokhrel, Rebekah [place], MD  torsemide (DEMADEX) 100 MG tablet Take 1 tablet (100 mg total) by mouth daily. 12/18/23   [place], [name], MD  Triamcinolone Acetonide  (TRIAMCINOLONE 0.1 % CREAM : EUCERIN) CREA Apply 1 Application topically 2 (two) times daily as needed for rash or itching. 02/03/24   [place], [name], MD      Allergies    Patient has no known allergies.    Review of Systems   Review of Systems  Physical Exam Updated Vital Signs There were no vitals taken for this visit. Physical Exam Vitals reviewed.  Constitutional:      General: He is not in acute distress.    Appearance: He is ill-appearing.  HENT:     Head: Normocephalic.     Right Ear: External ear normal.     Left Ear: External ear normal.     Nose: Nose normal.     Mouth/Throat:     Mouth: Mucous membranes are moist.  Eyes:     Pupils: Pupils are equal, round, and reactive to light.  Cardiovascular:     Rate and Rhythm: Normal rate and regular rhythm.     Pulses: Normal pulses.  Pulmonary:     Breath sounds: Rhonchi present.  Abdominal:     General: Abdomen is flat.     Palpations: Abdomen is soft.  Musculoskeletal:        General: Normal range of motion.     Cervical back: Normal range of motion.  Skin:    General: Skin is warm and dry.     Capillary Refill: Capillary refill takes less than 2 seconds.  Neurological:  General: No focal deficit present.     Mental Status: He is alert.     ED Results / Procedures / Treatments   Labs (all labs ordered are listed, but only abnormal results are displayed) Labs Reviewed  CBG MONITORING, ED    EKG None  Radiology No results found.  Procedures Procedures  {Document cardiac monitor, telemetry assessment procedure when appropriate:1}  Medications Ordered in ED Medications - No data to display  ED Course/ Medical Decision Making/ A&P Clinical Course as of 02/28/24 1237  Sun Feb 28, 2024  1227 Cxr  Reviewed and interpreted with right sided pneumonia [DR]    Clinical Course User Index [DR] Margarita Grizzle, MD   {   Click here for ABCD2, HEART and other calculatorsREFRESH Note before signing :1}                               Medical Decision Making Amount and/or Complexity of Data Reviewed Labs: ordered. Radiology: ordered.  Risk Prescription drug management.   89 yo male esrd, presents with vomiting, doe hypotension and hypoglycemia. Initial blood pressures were low. Patient has been evaluated here for sepsis.. Workup was significant for large right-sided pneumonia 1-pneumonia Patient with new oxygen requirement satting in the 90s on oxygen Patient does not wish intubation 2 sepsis likely secondary to #1 with broad-spectrum antibiotics and fluids Neutropenia, lactic acidosis Patient has received 30 cc/kg of fluid and broad-spectrum antibiotics Blood pressure has improved to 93/59 pulse rate has remained normal at 70, MAP currently at 69 3 leukopenia 4 hemoglobin 8.5 appears stable for this patient with first prior 8.1 5 renal failure with BUN elevated to 239 creatinine 13.0.  Discussed with patient and wife.  They have previously been seen for renal failure and advised not to proceed with dialysis.  They are in concurrence with this plan. 6 elevated liver function tests- ? Shock vs infection vs mets  Discussed palliative care with about aggressive treatment.  Specifically discussed no dialysis, no intubation, no central lines, no pressors. Patient and wife indicate that this is how they wish to proceed at this time. Hospitalist called for admission.  Plan consult to hospitalist team for    {Document critical care time when appropriate:1} {Document review of labs and clinical decision tools ie heart score, Chads2Vasc2 etc:1}  {Document your independent review of radiology images, and any outside records:1} {Document your discussion with family members, caretakers, and with consultants:1} {Document social determinants of health affecting pt's care:1} {Document your decision making why or why not admission, treatments were needed:1} Final Clinical Impression(s) / ED  Diagnoses Final diagnoses:  None    Rx / DC Orders ED Discharge Orders     None

## 2024-02-28 NOTE — ED Triage Notes (Addendum)
 Pt from home via EMS-copious vomiting yesterday. Today Not vomiting but called EMS for worsening DOE, though does have DOE at baseline. Hypotensive 86/50, refractory to 500cc fluid bolus. CBG 63 and given instant glucose by EMS with another decrease in CBG. Given 150cc D10. CBG 73 on arrival.

## 2024-02-28 NOTE — Sepsis Progress Note (Signed)
 Elink following code sepsis

## 2024-02-28 NOTE — H&P (Signed)
 History and Physical   Douglas Matthews ZOX:[id number] DOB: 02/14/1935 DOA: 02/28/2024  PCP: [place], [name], MD   Patient coming from: Home  Chief Complaint: Nausea, vomiting, shortness of breath  HPI: Douglas Matthews is a 89 y.o. male with medical history significant of hypertension, hyperlipidemia, hypothyroidism, atrial fibrillation, CKD 5, anemia, BPH, chronic systolic  CHF, Paget's disease of the bone presenting with nausea vomiting and shortness of breath.  Patient has had nausea vomiting for 1 day.  Also reports some associated dyspnea on exertion and generalized weakness.  EMS called out the day and patient's blood pressure noted to be in the 80s systolic without improvement after 500 cc IV fluids.  CBG also 63 which some improvement to 73 after glucose and D10.  Denies fevers, chills, chest pain, abdominal pain, constipation, diarrhea.  ED Course: Vital signs in the ED notable for temperature 97.1, blood pressure in the 80s to 90s systolic, requiring 2 L 2010 saturations.  Lab workup included CMP with sodium 146, potassium 5.3, bicarb 15, gap 27, BUN to 39, creatinine 13.01, glucose 150, calcium 8.3, protein 6.3, albumin 2.3, AST 276, ALT 111, ALP 176, T. bili 1.2.  CBC with leukopenia at 1.8 and hemoglobin stable at 8.5.  PT and INR elevated at 24.2 and 2.1 respectively PTT elevated at 37.  Lactic acid elevated at 5.6 and then 5.3 on repeat.  Urinalysis pending.  Blood cultures pending.  Chest x-ray with hazy right greater than left opacities favoring pulmonary edema over pneumonia.  Also noted with small right pleural effusion.  Patient received vancomycin, ceftriaxone, Flagyl in the ED as well as 1.8 L of IV fluids and started on a rate of IV fluids at 150 cc an hour.  Review of Systems: As per HPI otherwise all other systems reviewed and are negative.  Past Medical History:  Diagnosis Date   Anemia    Atrial fibrillation (HCC)    Atrial fibrillation with RVR (HCC) 10/24/2022    Atrial flutter (HCC)    BPH (benign prostatic hypertrophy)    CAP (community acquired pneumonia) 10/25/2022   Chronic HFrEF (heart failure with reduced ejection fraction) (HCC)    Chronic kidney disease (CKD), active medical management without dialysis, stage 5 (HCC)    Hyperlipidemia    Hypertension    Hypothyroidism    LBBB (left bundle branch block)     Past Surgical History:  Procedure Laterality Date   APPENDECTOMY     CARDIOVERSION N/A 10/29/2022   Procedure: CARDIOVERSION;  Surgeon: Lewayne Bunting, MD;  Location: [facility] ENDOSCOPY;  Service: Cardiovascular;  Laterality: N/A;   CARDIOVERSION N/A 05/21/2023   Procedure: CARDIOVERSION;  Surgeon: [name], MD;  Location: [facility] INVASIVE CV LAB;  Service: Cardiovascular;  Laterality: N/A;   HERNIA REPAIR     TEE WITHOUT CARDIOVERSION N/A 10/29/2022   Procedure: TRANSESOPHAGEAL ECHOCARDIOGRAM (TEE);  Surgeon: Lewayne Bunting, MD;  Location: [facility] ENDOSCOPY;  Service: Cardiovascular;  Laterality: N/A;    Social History  reports that he has never smoked. He has never used smokeless tobacco. He reports that he does not drink alcohol and does not use drugs.  No Known Allergies  Family History  Problem Relation Age of Onset   Diabetes Son    Heart disease Other   Reviewed on admission  Prior to Admission medications   Medication Sig Start Date End Date Taking? Authorizing Provider  albuterol (VENTOLIN HFA) 108 (90 Base) MCG/ACT inhaler Inhale 2 puffs into the lungs every 6 (  six) hours as needed for wheezing or shortness of breath. 01/13/24   Pokhrel, Rebekah Chesterfield, MD  aluminum sulfate-calcium acetate (DOMEBORO) packet Apply 1 packet topically 3 (three) times daily. Mixed it in water. Patient not taking: Reported on 01/14/2024 12/14/23   [place], [name], MD  amiodarone (PACERONE) 200 MG tablet Take 1 tablet (200 mg total) by mouth daily. 09/03/23   Gaston Islam., NP  calcitRIOL (ROCALTROL) 0.25 MCG capsule Take 0.25 mcg by mouth every  other day.    [provider]  levothyroxine (SYNTHROID) 112 MCG tablet Take 1 tablet (112 mcg total) by mouth daily. 01/29/24   [place], [name], MD  sodium bicarbonate 650 MG tablet Take 2 tablets (1,300 mg total) by mouth 2 (two) times daily. 01/13/24   Pokhrel, Rebekah Chesterfield, MD  torsemide (DEMADEX) 100 MG tablet Take 1 tablet (100 mg total) by mouth daily. 12/18/23   [place], [name], MD  Triamcinolone Acetonide (TRIAMCINOLONE 0.1 % CREAM : EUCERIN) CREA Apply 1 Application topically 2 (two) times daily as needed for rash or itching. 02/03/24   [place], [name], MD    Physical Exam: Vitals:   02/28/24 1330 02/28/24 1345 02/28/24 1415 02/28/24 1430  BP: (!) 87/64 (!) 84/58 (!) 87/61 (!) 86/58  Pulse:   67   Resp: 18 20 12 13   Temp:      TempSrc:      SpO2:   94%     Physical Exam Constitutional:      General: He is not in acute distress.    Appearance: He is ill-appearing.  HENT:     Head: Normocephalic and atraumatic.     Mouth/Throat:     Mouth: Mucous membranes are moist.     Pharynx: Oropharynx is clear.  Eyes:     Extraocular Movements: Extraocular movements intact.     Pupils: Pupils are equal, round, and reactive to light.  Cardiovascular:     Rate and Rhythm: Normal rate and regular rhythm.     Pulses: Normal pulses.     Heart sounds: Normal heart sounds.  Pulmonary:     Effort: Pulmonary effort is normal. No respiratory distress.     Breath sounds: Normal breath sounds.  Abdominal:     General: Bowel sounds are normal. There is no distension.     Palpations: Abdomen is soft.     Tenderness: There is no abdominal tenderness.  Musculoskeletal:        General: No swelling or deformity.  Skin:    General: Skin is warm and dry.  Neurological:     General: No focal deficit present.     Comments: Drowsy but able to answer question appropriate on initial evaluation    Labs on Admission: I have personally reviewed following labs and imaging studies  CBC: Recent Labs   Lab 02/28/24 1140  WBC 1.8*  NEUTROABS 1.5*  HGB 8.5*  HCT 27.3*  MCV 82.2  PLT 177    Basic Metabolic Panel: Recent Labs  Lab 02/28/24 1140  NA 146*  K 5.3*  CL 104  CO2 15*  GLUCOSE 150*  BUN 239*  CREATININE 13.01*  CALCIUM 8.3*    GFR: CrCl cannot be calculated (Unknown ideal weight.).  Liver Function Tests: Recent Labs  Lab 02/28/24 1140  AST 276*  ALT 111*  ALKPHOS 176*  BILITOT 2.1*  PROT 6.3*  ALBUMIN 2.3*    Urine analysis:    Component Value Date/Time   COLORURINE RED (A) 02/28/2024 1401  APPEARANCEUR TURBID (A) 02/28/2024 1401   LABSPEC  02/28/2024 1401    TEST NOT REPORTED DUE TO COLOR INTERFERENCE OF URINE PIGMENT   PHURINE  02/28/2024 1401    TEST NOT REPORTED DUE TO COLOR INTERFERENCE OF URINE PIGMENT   GLUCOSEU (A) 02/28/2024 1401    TEST NOT REPORTED DUE TO COLOR INTERFERENCE OF URINE PIGMENT   GLUCOSEU NEGATIVE 01/27/2024 1239   HGBUR (A) 02/28/2024 1401    TEST NOT REPORTED DUE TO COLOR INTERFERENCE OF URINE PIGMENT   HGBUR negative 09/19/2010 1013   BILIRUBINUR (A) 02/28/2024 1401    TEST NOT REPORTED DUE TO COLOR INTERFERENCE OF URINE PIGMENT   BILIRUBINUR negative 01/27/2024 1046   KETONESUR (A) 02/28/2024 1401    TEST NOT REPORTED DUE TO COLOR INTERFERENCE OF URINE PIGMENT   PROTEINUR (A) 02/28/2024 1401    TEST NOT REPORTED DUE TO COLOR INTERFERENCE OF URINE PIGMENT   UROBILINOGEN 0.2 01/27/2024 1239   UROBILINOGEN 0.2 01/27/2024 1046   NITRITE (A) 02/28/2024 1401    TEST NOT REPORTED DUE TO COLOR INTERFERENCE OF URINE PIGMENT   LEUKOCYTESUR (A) 02/28/2024 1401    TEST NOT REPORTED DUE TO COLOR INTERFERENCE OF URINE PIGMENT    Radiological Exams on Admission: DG Chest Port 1 View Result Date: 02/28/2024 CLINICAL DATA:  Dyspnea EXAM: PORTABLE CHEST 1 VIEW COMPARISON:  01/10/2024 FINDINGS: Stable cardiomegaly. Aortic atherosclerosis. Hazy right greater than left perihilar and right basilar airspace opacities. Small right  pleural effusion. No pneumothorax. IMPRESSION: 1. Hazy right greater than left perihilar and right basilar airspace opacities, favoring pulmonary edema over multifocal pneumonia. 2. Small right pleural effusion. Electronically Signed   By: Duanne Guess D.O.   On: 02/28/2024 12:23   EKG: Independently reviewed.  Left bundle branch block,?  Junctional rhythm, 73 bpm.  Nonspecific T wave changes.  Repolarization abnormality.  Low voltage multiple leads.  Similar morphology to  previous.  The previous appear to be atrial fibrillation.  Assessment/Plan Principal Problem:   Acute renal failure superimposed on stage 5 chronic kidney disease, not on chronic dialysis (HCC) Active Problems:   HTN (hypertension)   Atrial fibrillation, chronic (HCC)   HLD (hyperlipidemia)   BPH associated with nocturia   Paget's disease of the bone   CKD (chronic kidney disease), stage V (HCC)   Hypothyroidism   Anemia in stage 5 chronic kidney disease, not on chronic dialysis (HCC)   Sepsis CAP > Presenting with hypotension.  Chest x-ray favoring edema over pneumonia.  Leukopenia.  Afebrile. > Started on broad-spectrum antibiotics and fluids in the ED. > Lactic acid 5.6, 5.3.  Though with severity of renal disease will be more elevated, could be increased by perfusion issues from sepsis or heart failure. - Continue with broad-spectrum antibiotics for now pending pal discussions - Continue IV fluids - Midodrine - Trend lactic acid - Palliative discussion on going, patient already DNR/DNI/no central line/ no pressors/no dialysis, palliative care consult. ADDENDUM > Patient unable to tolerate PO any longer. > Continues to decline. HR 50s, BP 70s Systolic, MAP 55, Increasing O2 demand and WOB. > Further discussed with family (Wife and Son) and will transition to comfort care consistent with his known wishes; considering his continued rapid decline despite initial interventions. - Comfort care - Continued oxygen  by nasal cannula for comfort - 2 mg IV morphine now, start morphine drip following this - As needed Ativan for anxiety - As needed Haldol for agitation - As needed medications for secretions - Oral care  as indicated - Other supportive care as needed - Discontinue monitoring  ---------------------------------------------------------------------------------------  Acute renal failure on CKD 5 Uremia Hyperkalemia > Presenting with nausea and vomiting.  Found to have BUN of 239, creatinine newly elevated to 13.01 from baseline of around 9. > Potassium 5.3, sodium 146, bicarb 15, gap 27. > 10 history of CKD 5, follows with nephrology, told not a candidate for dialysis which is an approach he and his wife agree with. > Nausea and vomiting likely secondary to uremia. > Unclear etiology, patient has been intermittently hypotensive and has received IV fluids, suspected be primarily secondary to sepsis, unclear what extent CHF could be also playing a role. - Monitoring on progressive unit - Continue with gentle IV fluids for now, considering hypotension - Add on midodrine - Trend renal function and electrolytes - Palliative care consult  Hypertension > Blood pressure in the 80s-90s systolic in the ED. > Etiology still uncertain.  Sepsis versus acute on chronic systolic and right heart failure. 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 > Unclear if this could be contributing to hypotension in addition to sepsis. - Pending palliative discussion will consider repeat echo - Does not want central line or pressors - Midodrine - Hold off on diuresis considering hypotension - Check magnesium -  BNP  Hypertension - Holding diuretic in the setting of hypotension  Hyperlipidemia - Not on antilipemic  Hypothyroidism - Continue with Synthroid  Atrial fibrillation - Continue home amiodarone - Not on anticoagulation  Anemia > Hemoglobin stable at 8.5 - Trend CBC  DVT prophylaxis: Heparin Code Status:   DNR/DNI, no central line/no pressors, no dialysis >> Transitioned to comfort care later Family Communication:  Wife updated at bedside, wife and son updated at bedside on repeat evaluation about transition to comfort measures. Disposition Plan:   Patient is from:  Home  Anticipated DC to:  In-hospital death anticipated  Anticipated DC date:  1 to 2 days  Anticipated DC barriers: Possible need for hospice  Consults called:  Palliative medicine consult Admission status:  Observation, progressive>> transition to comfort care after he arrived to the floor.   Severity of Illness: The appropriate patient status for this patient is OBSERVATION. Observation status is judged to be reasonable and necessary in order to provide the required intensity of service to ensure the patient's safety. The patient's presenting symptoms, physical exam findings, and initial radiographic and laboratory data in the context of their medical condition is felt to place them at decreased risk for further clinical deterioration. Furthermore, it is anticipated that the patient will be medically stable for discharge from the hospital within 2 midnights of admission.    Synetta Fail MD [facility]  How to contact the [facility] Attending or Consulting provider 7A - 7P or covering provider during after hours 7P -7A, for this patient?   Check the care team in [facility] and look for a) attending/consulting TR[facility] provider listed and b) the [facility] team listed Log into [email or web address] and use [place]'s universal password to access. If you do not have the password, please contact the hospital operator. Locate the [facility]  provider you are looking for under [facility] and page to a number that you can be directly  reached. If you still have difficulty reaching the provider, please page the [facility] (Director on Call) for the Hospitalists listed on amion for assistance.  02/28/2024, 2:39 PM

## 2024-02-28 NOTE — Plan of Care (Addendum)
 Patient oriented to self x1.  Patient unable to participate in education. Patient continues to have a wet cough with moderate oral secretions but unable to cough effectively for secretions further down.  Patient NPO. Comfort care.  Problem: Education: Goal: Ability to demonstrate management of disease process will improve Outcome: Not Progressing Goal: Ability to verbalize understanding of medication therapies will improve Outcome: Not Progressing   Problem: Clinical Measurements: Goal: Will remain free from infection Outcome: Not Progressing Goal: Respiratory complications will improve Outcome: Not Progressing   Problem: Activity: Goal: Risk for activity intolerance will decrease Outcome: Not Progressing   Problem: Nutrition: Goal: Adequate nutrition will be maintained Outcome: Not Progressing   Problem: Elimination: Goal: Will not experience complications related to urinary retention Outcome: Not Progressing

## 2024-02-28 NOTE — Progress Notes (Signed)
 MEWS Progress Note  Patient Details Name: Douglas Matthews MRN: [id number] DOB: Sep 25, 1935 Today's Date: 02/28/2024   MEWS Flowsheet Documentation:  Assess: MEWS Score Temp: 97.8 F (36.6 C) BP: (!) 68/47 MAP (mmHg): (!) 55 Pulse Rate: (!) 52 ECG Heart Rate: (!) 51 Resp: 13 SpO2: (!) 77 % O2 Device: Nasal Cannula O2 Flow Rate (L/min): 4 L/min Assess: MEWS Score MEWS Temp: 0 MEWS Systolic: 3 MEWS Pulse: 0 MEWS RR: 1 MEWS LOC: 0 MEWS Score: 4 MEWS Score Color: Comfort Care Only Assess: SIRS CRITERIA SIRS Temperature : 0 SIRS Respirations : 0 SIRS Pulse: 0 SIRS WBC: 1 SIRS Score Sum : 1 SIRS Temperature : 0 SIRS Pulse: 0 SIRS Respirations : 0 SIRS WBC: 1 SIRS Score Sum : 1 Assess: if the MEWS score is Yellow or Red Were vital signs accurate and taken at a resting state?: Yes Does the patient meet 2 or more of the SIRS criteria?: Yes Does the patient have a confirmed or suspected source of infection?: Yes Notify: Charge Nurse/RN Name of Charge Nurse/RN Notified: Desirae RN Provider Notification Provider Name/Title: Dr. Alinda Money Date Provider Notified: 02/28/24 Time Provider Notified: [id number] Method of Notification: Page Notification Reason: Change in status Provider response: En route Date of Provider Response: 02/28/24 Time of Provider Response: 1604 Notify: Rapid Response Date Rapid Response Notified: 02/28/24 Time Rapid Response Notified: 1612   Patient transitioned to comfort care   [name] 02/28/2024, 4:28 PM

## 2024-02-29 ENCOUNTER — Inpatient Hospital Stay (HOSPITAL_COMMUNITY): Admission: RE | Admit: 2024-02-29 | Payer: Medicare Other | Source: Ambulatory Visit

## 2024-02-29 ENCOUNTER — Encounter (HOSPITAL_COMMUNITY): Payer: Self-pay

## 2024-02-29 DIAGNOSIS — J440 Chronic obstructive pulmonary disease with acute lower respiratory infection: Secondary | ICD-10-CM | POA: Diagnosis present

## 2024-02-29 DIAGNOSIS — D709 Neutropenia, unspecified: Secondary | ICD-10-CM | POA: Diagnosis present

## 2024-02-29 DIAGNOSIS — I5082 Biventricular heart failure: Secondary | ICD-10-CM | POA: Diagnosis present

## 2024-02-29 DIAGNOSIS — E785 Hyperlipidemia, unspecified: Secondary | ICD-10-CM | POA: Diagnosis present

## 2024-02-29 DIAGNOSIS — I482 Chronic atrial fibrillation, unspecified: Secondary | ICD-10-CM | POA: Diagnosis present

## 2024-02-29 DIAGNOSIS — E872 Acidosis, unspecified: Secondary | ICD-10-CM | POA: Diagnosis present

## 2024-02-29 DIAGNOSIS — Z515 Encounter for palliative care: Secondary | ICD-10-CM | POA: Diagnosis not present

## 2024-02-29 DIAGNOSIS — A419 Sepsis, unspecified organism: Secondary | ICD-10-CM | POA: Diagnosis present

## 2024-02-29 DIAGNOSIS — R6520 Severe sepsis without septic shock: Secondary | ICD-10-CM | POA: Diagnosis not present

## 2024-02-29 DIAGNOSIS — Z66 Do not resuscitate: Secondary | ICD-10-CM | POA: Diagnosis present

## 2024-02-29 DIAGNOSIS — D631 Anemia in chronic kidney disease: Secondary | ICD-10-CM | POA: Diagnosis present

## 2024-02-29 DIAGNOSIS — I5020 Unspecified systolic (congestive) heart failure: Secondary | ICD-10-CM | POA: Diagnosis not present

## 2024-02-29 DIAGNOSIS — I5042 Chronic combined systolic (congestive) and diastolic (congestive) heart failure: Secondary | ICD-10-CM | POA: Diagnosis present

## 2024-02-29 DIAGNOSIS — Z7189 Other specified counseling: Secondary | ICD-10-CM | POA: Diagnosis not present

## 2024-02-29 DIAGNOSIS — I132 Hypertensive heart and chronic kidney disease with heart failure and with stage 5 chronic kidney disease, or end stage renal disease: Secondary | ICD-10-CM | POA: Diagnosis present

## 2024-02-29 DIAGNOSIS — M889 Osteitis deformans of unspecified bone: Secondary | ICD-10-CM | POA: Diagnosis present

## 2024-02-29 DIAGNOSIS — R54 Age-related physical debility: Secondary | ICD-10-CM | POA: Diagnosis present

## 2024-02-29 DIAGNOSIS — N185 Chronic kidney disease, stage 5: Secondary | ICD-10-CM | POA: Diagnosis present

## 2024-02-29 DIAGNOSIS — I447 Left bundle-branch block, unspecified: Secondary | ICD-10-CM | POA: Diagnosis present

## 2024-02-29 DIAGNOSIS — N179 Acute kidney failure, unspecified: Secondary | ICD-10-CM | POA: Diagnosis present

## 2024-02-29 DIAGNOSIS — E875 Hyperkalemia: Secondary | ICD-10-CM | POA: Diagnosis present

## 2024-02-29 DIAGNOSIS — J189 Pneumonia, unspecified organism: Secondary | ICD-10-CM | POA: Diagnosis present

## 2024-02-29 DIAGNOSIS — E039 Hypothyroidism, unspecified: Secondary | ICD-10-CM | POA: Diagnosis present

## 2024-02-29 DIAGNOSIS — Z79890 Hormone replacement therapy (postmenopausal): Secondary | ICD-10-CM | POA: Diagnosis not present

## 2024-02-29 DIAGNOSIS — R64 Cachexia: Secondary | ICD-10-CM | POA: Diagnosis present

## 2024-02-29 DIAGNOSIS — R112 Nausea with vomiting, unspecified: Secondary | ICD-10-CM | POA: Diagnosis present

## 2024-02-29 DIAGNOSIS — Z833 Family history of diabetes mellitus: Secondary | ICD-10-CM | POA: Diagnosis not present

## 2024-02-29 MED ORDER — DEXTROSE IN LACTATED RINGERS 5 % IV SOLN: INTRAVENOUS | Status: DC

## 2024-02-29 MED ORDER — ENSURE ENLIVE PO LIQD: 237.0000 mL | Freq: Four times a day (QID) | ORAL | Status: DC

## 2024-03-01 LAB — URINE CULTURE

## 2024-03-01 NOTE — Progress Notes (Signed)
 Patient expired at 4. Death confirmed with Tish Frederickson, RN and Gilman Buttner, RN. Wife at bedside and is requesting the chaplain to come speak with her.   [id number]- Wife and 2 sons left bedside. Patients body prepped and transported to the morgue. Wife undecided about which funeral home, provided her with the number to call when she does decide.

## 2024-03-01 NOTE — Progress Notes (Signed)
 [facility] [phone] [facility] liaison note:  Referral received for hospice services at home. Talked with wife by phone and provided education related to hospice philosophy and services provided.   At this time wife is waiting for the rest of her family to arrive and would like to talk with them before making a decision as per her understanding patient may not be stable to transfer home.   Contact information provided and will follow up tomorrow.   Thank you for the opportunity to participate in this patient's plan of care Thea Gist, BSN, RN Hospice hospital liaison [phone]

## 2024-03-01 NOTE — Progress Notes (Signed)
    1938  Spiritual Encounters  Type of Visit Initial  Care provided to: Family  Referral source Nurse (RN/NT/LPN)  Reason for visit Patient death  OnCall Visit No   Chaplain responded to a call for support of family member of the deceased patient.  His wife of 58 years was at the bedside grieving the loss of her husband. She was confident that Douglas Matthews was at home with the [name] and at peace. She shared a life review of them together and how special each of those moments were. I listened intentenly as she shared. Their sons returned and I stepped away so the family could be together and support one another.   Valerie Roys [facility] [phone]

## 2024-03-01 NOTE — Evaluation (Signed)
 Clinical/Bedside Swallow Evaluation Patient Details  Name: Douglas Matthews MRN: [id number] Date of Birth: Sep 15, 1935  Today's Date:  Time: SLP Start Time (ACUTE ONLY): 0739 SLP Stop Time (ACUTE ONLY): 0820 SLP Time Calculation (min) (ACUTE ONLY): 41 min  Past Medical History:  Past Medical History:  Diagnosis Date   Anemia    Atrial fibrillation (HCC)    Atrial fibrillation with RVR (HCC) 10/24/2022   Atrial flutter (HCC)    BPH (benign prostatic hypertrophy)    CAP (community acquired pneumonia) 10/25/2022   Chronic HFrEF (heart failure with reduced ejection fraction) (HCC)    Chronic kidney disease (CKD), active medical management without dialysis, stage 5 (HCC)    Hyperlipidemia    Hypertension    Hypothyroidism    LBBB (left bundle branch block)    Past Surgical History:  Past Surgical History:  Procedure Laterality Date   APPENDECTOMY     CARDIOVERSION N/A 10/29/2022   Procedure: CARDIOVERSION;  Surgeon: Lewayne Bunting, MD;  Location: [facility] ENDOSCOPY;  Service: Cardiovascular;  Laterality: N/A;   CARDIOVERSION N/A 05/21/2023   Procedure: CARDIOVERSION;  Surgeon: [name], MD;  Location: [facility] INVASIVE CV LAB;  Service: Cardiovascular;  Laterality: N/A;   HERNIA REPAIR     TEE WITHOUT CARDIOVERSION N/A 10/29/2022   Procedure: TRANSESOPHAGEAL ECHOCARDIOGRAM (TEE);  Surgeon: Lewayne Bunting, MD;  Location: [facility] ENDOSCOPY;  Service: Cardiovascular;  Laterality: N/A;   HPI:  Douglas Matthews is a 89 y.o. male with medical history significant of hypertension, hyperlipidemia, hypothyroidism, atrial fibrillation, CKD 5, anemia, BPH, chronic systolic  CHF, Paget's disease of the bone presenting with nausea vomiting and shortness of breath. He is on comfort measures given his rapid decline.    Assessment / Plan / Recommendation  Clinical Impression   Pt seen for clinical swallow assessment at the request of family.  He is comfort care and family wishes to receive  guidance on safest comfort diet options and ways to reduce aspiration risk with careful feeding strategies if pt requests something to eat or drink.   Pt is lethargic, though able to respond appropriately to simple questions. He does appear to understand the majority of education provided. He reported not feeling hungry. Discussed with pt and wife that absence of hunger is a natural progression for the body and it is okay to not eat/drink if he is comfortable. Pt open to trying small sips of liquids and a small amount of applesauce for clinical swallow assessment.   Generally, pt's oropharyngeal swallow function appeared [facility] for consistencies tried. Pt risk of aspiration is secondary to regurgitation of food/liquids after the swallow. Suspect pt has a primary esophageal dysphagia. Esophageal workup is not indicated given pt's medical prognosis and significant deconditioning.   Pt tolerated ice chips and small cup sips of water without s/s of regurgitation. Pt demonstrated delayed throat, clearing and multiple swallows with straw sips of thin liquids. Started to hear gurgling quality of regurgitation with straw sips of water. Observed a moderate amount of clear and thin regurgitation ~30 seconds after swallow of half tsp of applesauce. Pt benefited from oral suctioning to remove regurgitated liquids as he was unable to completely expectorate.   SLP engaged pt and his wife in a long discussion about comfort diet measures, postprandial aspiration risk, and careful feeding techniques and positioning to reduce pt's risk of aspiration. We discussed that pt should not feel any pressure to eat and drink to maintain nutrition.  The purpose of a comfort diet  is to enhance pt's quality of life if he desires anything to eat or drink. Mr. and Mrs. Matthews verbalized understanding.  Douglas Matthews verbalized goal of discharging home. Douglas Matthews had multiple questions re: hospice care, which were deferred to case  management. RN and MD also updated. Douglas Matthews shared that they have been married for nearly 58 years and together for over 60 years. She verbalized good understanding of education today and had no further questions pertaining to pt's swallowing.   No further SLP needs identified at this time. MD updated and to place comfort diet of full liquids, thin liquids as indicated. SLP will sign off.   SLP Visit Diagnosis: Dysphagia, unspecified (R13.10)    Aspiration Risk  Moderate aspiration risk (postprandial aspiration risk)    Diet Recommendation  (Recommend comfort diet of full liquids, thin liqiuds. MD to order as indicated.)    Liquid Administration via: No straw;Cup;Spoon Medication Administration: Crushed with puree (non-oral meds if able) Supervision: Full supervision/cueing for compensatory strategies Compensations: Slow rate;Small sips/bites;Multiple dry swallows after each bite/sip Postural Changes: Seated upright at 90 degrees;Remain upright for at least 30 minutes after po intake    Other  Recommendations Oral Care Recommendations: Oral care QID;Staff/trained caregiver to provide oral care Caregiver Recommendations: Have oral suction available    Recommendations for follow up therapy are one component of a multi-disciplinary discharge planning process, led by the attending physician.  Recommendations may be updated based on patient status, additional functional criteria and insurance authorization.  Follow up Recommendations Follow physician's recommendations for discharge plan and follow up therapies         Functional Status Assessment Patient has had a recent decline in their functional status and/or demonstrates limited ability to make significant improvements in function in a reasonable and predictable amount of time         Prognosis Prognosis for improved oropharyngeal function: Guarded (comfort care/end of life) Barriers to Reach Goals: Severity of deficits       Swallow Study   General Date of Onset: 02/28/24 HPI: Douglas Matthews is a 89 y.o. male with medical history significant of hypertension, hyperlipidemia, hypothyroidism, atrial fibrillation, CKD 5, anemia, BPH, chronic systolic  CHF, Paget's disease of the bone presenting with nausea vomiting and shortness of breath. He is on comfort measures given his rapid decline. Type of Study: Bedside Swallow Evaluation Previous Swallow Assessment: none per chart; wife denied any hx of dysphagia Diet Prior to this Study: NPO Temperature Spikes Noted: No Respiratory Status: Nasal cannula (4L) History of Recent Intubation: No Behavior/Cognition: Alert;Requires cueing Oral Cavity Assessment: Dry Oral Cavity - Dentition: Dentures, top Vision: Impaired for self-feeding Self-Feeding Abilities: Total assist Patient Positioning: Upright in bed Baseline Vocal Quality: Low vocal intensity Volitional Cough: Weak Volitional Swallow:  (delayed)    Oral/Motor/Sensory Function Overall Oral Motor/Sensory Function: Mild impairment (generalized oral weakness in the setting due to deconditioning)   Ice Chips Ice chips: Within functional limits Presentation: Spoon   Thin Liquid Thin Liquid: Impaired Presentation: Cup;Spoon;Straw Pharyngeal  Phase Impairments: Cough - Delayed;Multiple swallows Other Comments: sounds of regurgitation with increasing trials - especially with larger straw sips    Nectar Thick Nectar Thick Liquid: Not tested   Honey Thick Honey Thick Liquid: Not tested   Puree Puree: Impaired Pharyngeal Phase Impairments: Multiple swallows;Cough - Delayed Other Comments: regurgitated applesauce and liquids <1 minute after the swallow   Solid     Solid: Not tested      Ellery Plunk  ,8:47 AM

## 2024-03-01 NOTE — Plan of Care (Signed)
  Problem: Activity: Goal: Capacity to carry out activities will improve Outcome: Progressing   Problem: Education: Goal: Knowledge of General Education information will improve Description: Including pain rating scale, medication(s)/side effects and non-pharmacologic comfort measures Outcome: Progressing   Problem: Health Behavior/Discharge Planning: Goal: Ability to manage health-related needs will improve Outcome: Progressing   Problem: Clinical Measurements: Goal: Cardiovascular complication will be avoided Outcome: Progressing   Problem: Coping: Goal: Level of anxiety will decrease Outcome: Progressing   Problem: Pain Managment: Goal: General experience of comfort will improve and/or be controlled Outcome: Progressing   Problem: Safety: Goal: Ability to remain free from injury will improve Outcome: Progressing

## 2024-03-01 NOTE — Progress Notes (Signed)
  RN notified that patient was on comfort care and unfortunately patient has been passed away at 7:20 PM on Mar 09, 2024. Family at the bedside. Will inform Dr. Thedore Mins in the morning regarding this patient's death.  Tereasa Coop, MD [facility] 03-09-2024, 8:00 PM

## 2024-03-01 NOTE — Consult Note (Signed)
 Palliative Care Consult Note                                  Date:    Patient Name: Douglas Matthews  DOB: 02-16-1935  MRN: [id number]  Age / Sex: 89 y.o., male  PCP: [place], [name], MD Referring Physician: Leroy Sea, MD  Reason for Consultation: Establishing goals of care  HPI/Patient Profile: 89 y.o. male  with past medical history of 89 y.o. male with medical history significant of hypertension, hyperlipidemia, hypothyroidism, atrial fibrillation, CKD 5, anemia, BPH, chronic systolic  CHF, Paget's disease of the bone presenting with nausea vomiting and shortness of breath.  He presented to the hospital with nausea and vomiting for 1 day, dyspnea on exertion, generalized weakness.  He was admitted on 02/28/2024 with sepsis due to pneumonia, uremia as well as AKI on CKD 5, history of COPD, history of HFrEF (EF 20%), cachexia, deconditioning, and others.   After admission and discussion with the hospitalist the family transition the patient to comfort care.  Palliative medicine was consulted for ongoing support and comfort care, as well as goals of care for end-of-life.  Past Medical History:  Diagnosis Date   Anemia    Atrial fibrillation (HCC)    Atrial fibrillation with RVR (HCC) 10/24/2022   Atrial flutter (HCC)    BPH (benign prostatic hypertrophy)    CAP (community acquired pneumonia) 10/25/2022   Chronic HFrEF (heart failure with reduced ejection fraction) (HCC)    Chronic kidney disease (CKD), active medical management without dialysis, stage 5 (HCC)    Hyperlipidemia    Hypertension    Hypothyroidism    LBBB (left bundle branch block)     Subjective:   This NP Wynne Dust reviewed medical records, received report from team, assessed the patient and then meet at the patient's bedside to discuss diagnosis, prognosis, GOC, EOL wishes disposition and options.  I met with the patient at bedside, no family was  present.  I did call and speak with the patient's wife Danyell Awbrey on the phone after seeing the patient.   We meet to discuss diagnosis prognosis, GOC, EOL wishes, disposition and options. Concept of Palliative Care was introduced as specialized medical care for people and their families living with serious illness.  If focuses on providing relief from the symptoms and stress of a serious illness.  The goal is to improve quality of life for both the patient and the family. Values and goals of care important to patient and family were attempted to be elicited.  Created space and opportunity for patient  and family to explore thoughts and feelings regarding current medical situation   Natural trajectory and current clinical status were discussed. Questions and concerns addressed. Patient  encouraged to call with questions or concerns.    Patient/Family Understanding of Illness: His wife understands that he is very sick and facing end-of-life.  She notes the focus is on comfort, peace, dignity.  She is happy that he will be allowed to eat if he wants.  Life Review: They had a married for 58 years, but have been together for over 60 years.  The patient has 2 sons.  Goals: Comfort, peace, dignity as patient approaches end-of-life.  Ideally they would like the patient to be able to come home for hospice care.  Today's Discussion: In addition to discussions described above we had extensive discussion of various topics.  When I saw the patient at the bedside, he opened his eyes and made eye contact to ambient room noise.  He denies pain, nausea, vomiting, dyspnea.  He tries to generate statements that is very difficult understand that I do not think he is making meaningful conversation.  He does not objectively appear to be in any discomfort.    I called his wife on the phone and we discussed comfort care. I explained comfort care as care where the patient would no longer receive aggressive medical  interventions such as continuous vital signs, lab work, radiology testing, or medications not focused on comfort, peace, and dignity. This includes stopping antibiotics and weaning oxygen to room air, as these are generally not accepted as providing comfort but only prolonging the dying process artificially. All care would focus on how the patient is looking and feeling. This would include management of any symptoms that may cause discomfort, pain, shortness of breath/air hunger, increased work of breathing, cough, nausea, agitation/restlessness, anxiety, and/or secretions etc. Symptoms would be managed with medications and other non-pharmacological interventions such as spiritual support if requested, repositioning, music therapy, or therapeutic listening. Family verbalized understanding and confirmed desire for comfort care.  We talked about hospice is an option for comfort care at home. I described hospice as a service for patients who have a life expectancy of 6 months or less. The goal of hospice is the preservation of dignity and quality at the end phases of life. Under hospice care, the focus changes from curative to symptom relief. I explained the three setting where hospice services can be provided including the home, at a living facility (such as LTC SNF, Assisted Living, etc), and a hospice facility. I explained that acceptance to hospice in any specific location is the final decision of the hospice medical director and bed availability, if applicable. They verbalized understanding.  I did make it clear that the patient is very sick and there would be a risk of the patient passing away on transfer to either home for hospice or [facility].  Family understands.  At the end of our discussions, the patient's wife states that she will be back in the hospital around 1230.  She agreed for me to consult [facility] to meet with her at 1230 and discussed options.  I shared that if they  choose to try to take him home with hospice that we would do our best to arrange that transfer home.  However, I reminded her of risk of passing and route given how sick he is.  I shared that if they would like him to go home it would be best to do that sooner rather than later due to the risk increasing over time.  She verbalized understanding.  I shared that while the patient remains in the hospital palliative medicine would see daily for ongoing comfort care. I provided emotional and general support through therapeutic listening, empathy, sharing of stories, and other techniques. I answered all questions and addressed all concerns to the best of my ability.  Review of Systems  Constitutional:        Denies pain in general  Respiratory:  Negative for shortness of breath.   Gastrointestinal:  Negative for nausea and vomiting.    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  Constitutional:      General: He is sleeping. He is not in acute distress. HENT:     Head: Normocephalic and atraumatic.  Cardiovascular:     Rate and Rhythm: Normal rate.  Pulmonary:     Effort: Pulmonary effort is normal. No respiratory distress.     Breath sounds: Rhonchi present.  Abdominal:     General: Abdomen is flat. Bowel sounds are normal. There is no distension.     Palpations: Abdomen is soft.     Tenderness: There is no abdominal tenderness.  Skin:    General: Skin is warm and dry.  Neurological:     Mental Status:  He is easily aroused. He is confused.     Vital Signs:  BP (!) 75/53   Pulse (!) 58   Temp 97.7 F (36.5 C) (Axillary)   Resp 17   Ht 5\' 8"  (1.727 m)   Wt 59.8 kg   SpO2 (!) 86%   BMI 20.05 kg/m   Palliative Assessment/Data: 20%    Advanced Care Planning:   Existing Vynca/ACP Documentation: DNR effective 01/11/2024 MOST form completed 01/11/2024  Primary Decision Maker: NEXT OF KIN  Code Status/Advance Care Planning: DNR-comfort  A discussion was had today regarding advanced directives. Concepts specific to code status, artifical feeding and hydration, continued IV antibiotics and rehospitalization was had.  The difference between a aggressive medical intervention path and a palliative comfort care path for this patient at this time was had.   Decisions/Changes to ACP: None today  Assessment & Plan:   Impression: 89 year old male with acute presentation chronic comorbidities as described above.  The patient is unfortunately sick with severe sepsis, multifactorial.  Family yesterday made the decision for DNR-comfort, transition to comfort care.  We are working on disposition today with options including home with hospice, hospice facility, versus in-hospital death.  As of right now I feel that he is stable for transfer to home with hospice if family decides.  However, this could rapidly change that I cautioned them against the risk of transfer if they wait much longer.  They are meeting with hospice today at 1230 for decisions.  Overall prognosis grave  SUMMARY OF RECOMMENDATIONS   DNR-comfort Continue comfort care See symptom management orders below as previously ordered by hospitalist with any noted changes Palliative medicine will continue to follow while admitted  Symptom Management:  Tylenol 650 mg PR every 6 hours as needed mild pain or fever Atropine 1% ophthalmic 4 drops sublingual every 4 hours as needed excessive secretions Haldol 0.5 mg IV every 4 hours as  needed agitation Ativan 1 mg IV every 4 hours as needed anxiety Morphine drip 1 to 15 mg/h titrate per instructions Morphine bolus via infusion 1 to 4 mg IV every 15 minutes as needed breakthrough discomfort or distress Zofran 4 mg IV every 6 hours as needed nausea Polyvinyl alcohol 1.4% ophthalmic 1 drop OU 4 times daily as needed dry eyes  Prognosis:  Hours - Days  Discharge Planning:  To Be Determined   Discussed with: Patient's family, medical team, nursing team, [facility], [facility] team, hospice liaison    Thank you for allowing [place] to participate in the care of DARSH VANDEVOORT PMT will  continue to support holistically.  Time Total: 62 min  Detailed review of medical records (labs, imaging, vital signs), medically appropriate exam, discussed with treatment team, counseling and education to patient, family, & staff, documenting clinical information, medication management, coordination of care  Signed by: Wynne Dust, NP Palliative Medicine Team  Team Phone # [phone] (Nights/Weekends)  , 12:30 PM

## 2024-03-01 NOTE — Progress Notes (Signed)
 95 mls of Morphine wasted, witnessed by Debroah Loop, RN.

## 2024-03-01 DEATH — deceased

## 2024-03-04 ENCOUNTER — Ambulatory Visit: Payer: Medicare Other | Admitting: Cardiology

## 2024-03-04 LAB — CULTURE, BLOOD (ROUTINE X 2)
Culture: NO GROWTH
Culture: NO GROWTH

## 2024-03-11 ENCOUNTER — Other Ambulatory Visit: Payer: Self-pay | Admitting: Family Medicine

## 2024-03-11 DIAGNOSIS — E032 Hypothyroidism due to medicaments and other exogenous substances: Secondary | ICD-10-CM

## 2024-03-14 ENCOUNTER — Encounter (HOSPITAL_COMMUNITY)

## 2024-03-28 ENCOUNTER — Encounter (HOSPITAL_COMMUNITY)

## 2024-03-31 NOTE — Death Summary Note (Signed)
 [facility] Death Note                                                                                                                                                                                               Douglas Matthews, is a 89 y.o. male, DOB - [birth year], [id number]  Admit date - 03-03-2024   Admitting Physician Synetta Fail, MD  Outpatient Primary MD for the patient is [place], [name], MD  LOS - 1  No chief complaint on file.      Notification: [place], [name], MD notified of death of 05-Mar-2024   Admit Date:  03/03/2024  Date of Death: Date of Death: 03-04-24  Time of Death: Time of Death: 1922  Length of Stay: 1    Pronounced by - RN  History of present illness:   Douglas Matthews is a 89 y.o. male with a history of -  hypertension, hyperlipidemia, hypothyroidism, atrial fibrillation, CKD 5, anemia, BPH, chronic systolic  CHF, Paget's disease of the bone presenting with nausea vomiting and shortness of breath. He had extremely advanced baseline deconditioning, extremely frail and cachectic, soon after admission wife and son decided to pursue full comfort measures, passed away on 03/04/24 in comfort.   Final Diagnoses:  Cause of death -  Pneumonia, AKI  Signature  -    Susa Raring M.D on 2024-03-05 at 5:06 AM   -  To page go to [email or web address]   Total clinical and documentation time for today Under 30 minutes   Last Note                                                                      PROGRESS NOTE  Patient Demographics:    Douglas Matthews, is a 89 y.o. male, DOB - [birth year], [id number]  Outpatient Primary MD for the patient is [place], [name], MD    LOS - 1   Admit date - 02/28/2024    No chief complaint on file.      Brief Narrative (HPI from H&P)   89 y.o. male with medical history significant of hypertension, hyperlipidemia, hypothyroidism, atrial fibrillation, CKD 5, anemia, BPH, chronic systolic  CHF, Paget's disease of the bone presenting with nausea vomiting and shortness of breath.   Patient has had nausea vomiting for 1 day.  Also reports some associated dyspnea on exertion and generalized weakness.  EMS called out the day and patient's blood pressure noted to be in the 80s systolic without improvement after 500 cc IV fluids.  CBG also 63 which some improvement to 73 after glucose and D10.  He was admitted for treatment of sepsis and pneumonia after admission family decided to make patient DNR and transition him to full comfort measures.   Subjective:    Douglas Matthews today in bed denies any headache or chest pain.   Assessment  & Plan :   Sepsis present on admission due to pneumonia, uremia, AKI on CKD 5, history of COPD, history of chronic systolic heart failure EF 20%, A-fib, anemia, severe cachexia and deconditioning.  Had extremely advanced baseline deconditioning, extremely frail and cachectic, soon after admission wife and son decided to pursue full comfort measures which she is on, we are trying to arrange for home hospice hopefully in a day or 2.  Goal of care is full comfort.      Condition - Extremely Guarded  Family Communication  : Wife bedside on  confirms DNR and comfort care  Code Status :  DNR  Consults  :   Pall care  PUD Prophylaxis :     Procedures  :            Disposition Plan  :    Status is: Observation  DVT Prophylaxis  :  SCDs    Lab Results  Component Value Date   PLT 177 02/28/2024    Diet :  Diet Order     None        Inpatient Medications  Scheduled Meds:   Continuous Infusions:   PRN Meds:.  Antibiotics  :    Anti-infectives (From admission, onward)     Start     Dose/Rate Route Frequency Ordered Stop    1300  ceFEPIme (MAXIPIME) 1 g in sodium chloride 0.9 % 100 mL IVPB  Status:  Discontinued        1 g 200 mL/hr over 30 Minutes Intravenous Every 24 hours 02/28/24 1519 02/28/24 1626   02/28/24 2000  metroNIDAZOLE (FLAGYL) IVPB 500 mg  Status:  Discontinued        500 mg 100 mL/hr over 60 Minutes Intravenous Every 12 hours 02/28/24 1511 02/28/24 1626   02/28/24 1519  vancomycin variable dose per unstable renal function (pharmacist dosing)  Status:  Discontinued         Does not apply See admin instructions 02/28/24 1519 02/28/24 1626   02/28/24 1230  ceFEPIme (MAXIPIME) 2 g in sodium chloride 0.9 % 100 mL IVPB        2 g 200 mL/hr over 30 Minutes Intravenous  Once 02/28/24 1227 02/28/24 1330   02/28/24 1230  metroNIDAZOLE (FLAGYL) IVPB 500 mg        500 mg 100  mL/hr over 60 Minutes Intravenous  Once 02/28/24 1227 02/28/24 1402   02/28/24 1230  vancomycin (VANCOCIN) IVPB 1000 mg/200 mL premix        1,000 mg 200 mL/hr over 60 Minutes Intravenous  Once 02/28/24 1227 02/28/24 1626         Objective:   Vitals:   02/28/24 1945 02/28/24 2200  0000  0041  BP: (!) 65/54 (!) 75/53    Pulse: 62 (!) 56 (!) 58   Resp: 17     Temp: 97.7 F (36.5 C)     TempSrc: Axillary     SpO2: 90% (!) 88% (!) 89% (!) 86%  Weight:      Height:        Wt Readings from Last 3 Encounters:  02/28/24 59.8 kg  02/03/24 59 kg  01/22/24 56.7 kg    No intake or output data in the 24 hours ending 03/01/24 0506    Physical Exam  [place] frail, cachectic elderly African-American gentleman lying in hospital bed in no apparent distress, minimally responsive, no focal deficits [place].AT,PERRAL Supple Neck, No JVD,   Symmetrical Chest wall movement, Good air movement bilaterally, CTAB RRR,No Gallops,Rubs or new Murmurs,  +ve B.Sounds, Abd Soft, No tenderness,   No Cyanosis, Clubbing or edema     RN pressure injury  documentation:      Data Review:    Recent Labs  Lab 02/28/24 1140  WBC 1.8*  HGB 8.5*  HCT 27.3*  PLT 177  MCV 82.2  MCH 25.6*  MCHC 31.1  RDW 20.2*  LYMPHSABS 0.1*  MONOABS 0.1  EOSABS 0.0  BASOSABS 0.0    Recent Labs  Lab 02/28/24 1140 02/28/24 1204 02/28/24 1409  NA 146*  --   --   K 5.3*  --   --   CL 104  --   --   CO2 15*  --   --   ANIONGAP 27*  --   --   GLUCOSE 150*  --   --   BUN 239*  --   --   CREATININE 13.01*  --   --   AST 276*  --   --   ALT 111*  --   --   ALKPHOS 176*  --   --   BILITOT 2.1*  --   --   ALBUMIN 2.3*  --   --   LATICACIDVEN  --  5.6* 5.3*  INR 2.1*  --   --   BNP 1,814.5*  --   --   MG 2.3  --   --   CALCIUM 8.3*  --   --       Recent Labs  Lab 02/28/24 1140 02/28/24 1204 02/28/24 1409  LATICACIDVEN  --  5.6* 5.3*  INR 2.1*  --   --   BNP 1,814.5*  --   --   MG 2.3  --   --   CALCIUM 8.3*  --   --     --------------------------------------------------------------------------------------------------------------- Lab Results  Component Value Date   CHOL 259 (H) 05/08/2021   HDL 59.40 05/08/2021   LDLCALC 182 (H) 05/08/2021   LDLDIRECT 220.5 12/15/2011   TRIG 87.0 05/08/2021   CHOLHDL 4 05/08/2021    Lab Results  Component Value Date   HGBA1C 6.2 04/06/2023   No results for input(s): "TSH", "T4TOTAL", "FREET4", "T3FREE", "THYROIDAB" in the last 72 hours. No results for input(s): "VITAMINB12", "FOLATE", "FERRITIN", "TIBC", "IRON", "RETICCTPCT" in the last 72 hours. ------------------------------------------------------------------------------------------------------------------  Cardiac Enzymes No results for input(s): "CKMB", "TROPONINI", "MYOGLOBIN" in the last 168 hours.  Invalid input(s): "CK"  Micro Results Recent Results (from the past 240 hours)  Blood Culture (routine x 2)     Status: None (Preliminary result)   Collection Time: 02/28/24 11:57 AM   Specimen: BLOOD  Result Value Ref Range  Status   Specimen Description BLOOD RIGHT ANTECUBITAL  Final   Special Requests   Final    BOTTLES DRAWN AEROBIC AND ANAEROBIC Blood Culture results may not be optimal due to an inadequate volume of blood received in culture bottles   Culture   Final    NO GROWTH < 24 HOURS Performed at [facility], [address] [place], [place] [address]    Report Status PENDING  Incomplete  Blood Culture (routine x 2)     Status: None (Preliminary result)   Collection Time: 02/28/24 12:02 PM   Specimen: BLOOD LEFT ARM  Result Value Ref Range Status   Specimen Description BLOOD LEFT ARM  Final   Special Requests   Final    BOTTLES DRAWN AEROBIC AND ANAEROBIC Blood Culture results may not be optimal due to an inadequate volume of blood received in culture bottles   Culture   Final    NO GROWTH < 24 HOURS Performed at [facility], [address], [place], [place] [address]    Report Status PENDING  Incomplete  Urine Culture     Status: None (Preliminary result)   Collection Time: 02/28/24  2:01 PM   Specimen: Urine, Clean Catch  Result Value Ref Range Status   Specimen Description URINE, CLEAN CATCH  Final   Special Requests   Final    NONE Reflexed from [id number] Performed at [facility], [address] [place], [place] [address]    Culture PENDING  Incomplete   Report Status PENDING  Incomplete    Radiology Report DG Chest Port 1 View Result Date: 02/28/2024 CLINICAL DATA:  Dyspnea EXAM: PORTABLE CHEST 1 VIEW COMPARISON:  01/10/2024 FINDINGS: Stable cardiomegaly. Aortic atherosclerosis. Hazy right greater than left perihilar and right basilar airspace opacities. Small right pleural effusion. No pneumothorax. IMPRESSION: 1. Hazy right greater than left perihilar and right basilar airspace opacities, favoring pulmonary edema over multifocal pneumonia. 2. Small right pleural effusion. Electronically Signed   By: Duanne Guess D.O.   On: 02/28/2024 12:23     Signature  -    Susa Raring M.D on 03/01/2024 at 5:06 AM   -  To page go to [email or web address]

## 2024-05-30 ENCOUNTER — Ambulatory Visit: Payer: Medicare Other
